# Patient Record
Sex: Male | Born: 1945 | Race: White | Hispanic: No | Marital: Married | Smoking: Former smoker
Health system: Southern US, Community
[De-identification: ages and names within clinical notes are randomized; demographics above are authoritative.]

## PROBLEM LIST (undated history)

## (undated) DIAGNOSIS — M1990 Unspecified osteoarthritis, unspecified site: Secondary | ICD-10-CM

## (undated) DIAGNOSIS — Z85528 Personal history of other malignant neoplasm of kidney: Secondary | ICD-10-CM

## (undated) DIAGNOSIS — N529 Male erectile dysfunction, unspecified: Secondary | ICD-10-CM

## (undated) DIAGNOSIS — J449 Chronic obstructive pulmonary disease, unspecified: Secondary | ICD-10-CM

## (undated) DIAGNOSIS — E7800 Pure hypercholesterolemia, unspecified: Secondary | ICD-10-CM

## (undated) DIAGNOSIS — K469 Unspecified abdominal hernia without obstruction or gangrene: Secondary | ICD-10-CM

## (undated) DIAGNOSIS — G2581 Restless legs syndrome: Secondary | ICD-10-CM

## (undated) DIAGNOSIS — I10 Essential (primary) hypertension: Secondary | ICD-10-CM

## (undated) DIAGNOSIS — C801 Malignant (primary) neoplasm, unspecified: Secondary | ICD-10-CM

## (undated) DIAGNOSIS — J349 Unspecified disorder of nose and nasal sinuses: Secondary | ICD-10-CM

## (undated) HISTORY — DX: Malignant (primary) neoplasm, unspecified: C80.1

## (undated) HISTORY — DX: Personal history of other malignant neoplasm of kidney: Z85.528

## (undated) HISTORY — PX: CARDIAC CATHETERIZATION: SHX172

## (undated) HISTORY — DX: Unspecified disorder of nose and nasal sinuses: J34.9

## (undated) HISTORY — DX: Chronic obstructive pulmonary disease, unspecified: J44.9

## (undated) HISTORY — DX: Male erectile dysfunction, unspecified: N52.9

## (undated) HISTORY — DX: Unspecified abdominal hernia without obstruction or gangrene: K46.9

## (undated) HISTORY — DX: Unspecified osteoarthritis, unspecified site: M19.90

## (undated) HISTORY — DX: Essential (primary) hypertension: I10

## (undated) HISTORY — DX: Pure hypercholesterolemia, unspecified: E78.00

## (undated) HISTORY — DX: Restless legs syndrome: G25.81

## (undated) NOTE — *Deleted (*Deleted)
Patient resting in the chair unresponsive, on Palliative Care with morphine 5mg /hr.

---

## 1992-11-09 HISTORY — PX: OTHER SURGICAL HISTORY: SHX169

## 1998-04-16 ENCOUNTER — Encounter: Admission: RE | Admit: 1998-04-16 | Discharge: 1998-04-16 | Payer: Self-pay | Admitting: *Deleted

## 1998-04-24 ENCOUNTER — Encounter: Admission: RE | Admit: 1998-04-24 | Discharge: 1998-04-24 | Payer: Self-pay | Admitting: *Deleted

## 1999-12-29 ENCOUNTER — Encounter: Payer: Self-pay | Admitting: Urology

## 1999-12-29 ENCOUNTER — Encounter: Admission: RE | Admit: 1999-12-29 | Discharge: 1999-12-29 | Payer: Self-pay | Admitting: Urology

## 2001-01-20 ENCOUNTER — Encounter: Payer: Self-pay | Admitting: Urology

## 2001-01-20 ENCOUNTER — Encounter: Admission: RE | Admit: 2001-01-20 | Discharge: 2001-01-20 | Payer: Self-pay | Admitting: Urology

## 2001-10-14 ENCOUNTER — Encounter: Payer: Self-pay | Admitting: Emergency Medicine

## 2001-10-14 ENCOUNTER — Emergency Department (HOSPITAL_COMMUNITY): Admission: EM | Admit: 2001-10-14 | Discharge: 2001-10-14 | Payer: Self-pay | Admitting: Emergency Medicine

## 2002-03-21 ENCOUNTER — Encounter: Payer: Self-pay | Admitting: Urology

## 2002-03-21 ENCOUNTER — Encounter: Admission: RE | Admit: 2002-03-21 | Discharge: 2002-03-21 | Payer: Self-pay | Admitting: Urology

## 2003-04-17 ENCOUNTER — Encounter: Admission: RE | Admit: 2003-04-17 | Discharge: 2003-04-17 | Payer: Self-pay | Admitting: Urology

## 2003-04-17 ENCOUNTER — Encounter: Payer: Self-pay | Admitting: Urology

## 2004-04-23 ENCOUNTER — Ambulatory Visit (HOSPITAL_COMMUNITY): Admission: RE | Admit: 2004-04-23 | Discharge: 2004-04-23 | Payer: Self-pay | Admitting: Urology

## 2005-04-28 ENCOUNTER — Ambulatory Visit (HOSPITAL_COMMUNITY): Admission: RE | Admit: 2005-04-28 | Discharge: 2005-04-28 | Payer: Self-pay | Admitting: Urology

## 2006-05-28 ENCOUNTER — Ambulatory Visit: Payer: Self-pay | Admitting: Pulmonary Disease

## 2006-07-15 ENCOUNTER — Ambulatory Visit: Payer: Self-pay | Admitting: Pulmonary Disease

## 2006-11-30 ENCOUNTER — Encounter: Admission: RE | Admit: 2006-11-30 | Discharge: 2006-11-30 | Payer: Self-pay | Admitting: Family Medicine

## 2007-01-18 ENCOUNTER — Ambulatory Visit: Payer: Self-pay | Admitting: Pulmonary Disease

## 2007-06-15 ENCOUNTER — Encounter: Admission: RE | Admit: 2007-06-15 | Discharge: 2007-06-15 | Payer: Self-pay | Admitting: Urology

## 2007-08-12 DIAGNOSIS — I10 Essential (primary) hypertension: Secondary | ICD-10-CM | POA: Insufficient documentation

## 2007-08-12 DIAGNOSIS — J309 Allergic rhinitis, unspecified: Secondary | ICD-10-CM | POA: Insufficient documentation

## 2007-08-12 DIAGNOSIS — E785 Hyperlipidemia, unspecified: Secondary | ICD-10-CM

## 2007-08-12 DIAGNOSIS — D0910 Carcinoma in situ of unspecified urinary organ: Secondary | ICD-10-CM

## 2007-08-12 DIAGNOSIS — J439 Emphysema, unspecified: Secondary | ICD-10-CM

## 2007-08-15 ENCOUNTER — Ambulatory Visit: Payer: Self-pay | Admitting: Pulmonary Disease

## 2008-09-14 ENCOUNTER — Ambulatory Visit: Payer: Self-pay | Admitting: Pulmonary Disease

## 2008-10-01 ENCOUNTER — Ambulatory Visit: Payer: Self-pay | Admitting: Internal Medicine

## 2008-10-31 ENCOUNTER — Telehealth (INDEPENDENT_AMBULATORY_CARE_PROVIDER_SITE_OTHER): Payer: Self-pay | Admitting: *Deleted

## 2008-11-01 ENCOUNTER — Ambulatory Visit: Payer: Self-pay | Admitting: Pulmonary Disease

## 2009-03-18 ENCOUNTER — Emergency Department (HOSPITAL_COMMUNITY): Admission: EM | Admit: 2009-03-18 | Discharge: 2009-03-18 | Payer: Self-pay | Admitting: Family Medicine

## 2009-03-19 ENCOUNTER — Ambulatory Visit: Payer: Self-pay | Admitting: Pulmonary Disease

## 2009-03-27 ENCOUNTER — Ambulatory Visit: Payer: Self-pay | Admitting: Pulmonary Disease

## 2009-07-25 ENCOUNTER — Ambulatory Visit: Payer: Self-pay | Admitting: Pulmonary Disease

## 2009-11-07 ENCOUNTER — Telehealth: Payer: Self-pay | Admitting: Pulmonary Disease

## 2009-12-13 ENCOUNTER — Telehealth: Payer: Self-pay | Admitting: Pulmonary Disease

## 2009-12-16 ENCOUNTER — Telehealth: Payer: Self-pay | Admitting: Pulmonary Disease

## 2010-01-21 ENCOUNTER — Ambulatory Visit: Payer: Self-pay | Admitting: Pulmonary Disease

## 2010-02-17 ENCOUNTER — Telehealth: Payer: Self-pay | Admitting: Pulmonary Disease

## 2010-05-02 ENCOUNTER — Ambulatory Visit: Payer: Self-pay | Admitting: Critical Care Medicine

## 2010-05-02 DIAGNOSIS — F17200 Nicotine dependence, unspecified, uncomplicated: Secondary | ICD-10-CM

## 2010-05-02 DIAGNOSIS — J209 Acute bronchitis, unspecified: Secondary | ICD-10-CM

## 2010-05-20 ENCOUNTER — Telehealth (INDEPENDENT_AMBULATORY_CARE_PROVIDER_SITE_OTHER): Payer: Self-pay | Admitting: *Deleted

## 2010-06-05 ENCOUNTER — Ambulatory Visit: Payer: Self-pay | Admitting: Pulmonary Disease

## 2010-10-06 ENCOUNTER — Ambulatory Visit: Payer: Self-pay | Admitting: Pulmonary Disease

## 2010-10-06 DIAGNOSIS — G2581 Restless legs syndrome: Secondary | ICD-10-CM

## 2010-12-11 NOTE — Assessment & Plan Note (Signed)
Summary: rov for emphysema   Visit Type:  Follow-up Primary Harold Hall/Referring Harold Hall:  Harold Hall  CC:  patient states breathing has been doing well.  Still smokes 3cigs a day.  History of Present Illness: the pt comes in today for f/u of his known emphysema.  He is maintaining on his usual bronchodilator regimen, and feels that his exertional tolerance is stable.  He has not had a recent pulmonary infection, nor has he had an acute exacerbation.  He denies significant cough or congestion.  Unfortunately, he is still smoking.  During our visit, he is c/o abnormal sensations in his LE which improves with movement and can interfere with sleep.  He has never been diagnosed with RLS.  Current Medications (verified): 1)  Lipitor 20 Mg  Tabs (Atorvastatin Calcium) .... Take One Tab By Mouth Once Daily 2)  Zyrtec Allergy 10 Mg  Tabs (Cetirizine Hcl) .... Take Once Daily 3)  Losartan Potassium-Hctz 100-25 Mg Tabs (Losartan Potassium-Hctz) .... Once Daily 4)  Buspirone Hcl 5 Mg  Tabs (Buspirone Hcl) .... Take Once Daily 5)  Spiriva Handihaler 18 Mcg  Caps (Tiotropium Bromide Monohydrate) .... Take One Cap Once Daily 6)  Astelin 137 Mcg/spray  Soln (Azelastine Hcl) .... Take As Needed 7)  Cialis 20 Mg  Tabs (Tadalafil) .... Take As Needed 8)  Proair Hfa 108 (90 Base) Mcg/act  Aers (Albuterol Sulfate) .Marland Kitchen.. 1-2 Puffs Every 4-6 Hours As Needed 9)  Mucinex 600 Mg Xr12h-Tab (Guaifenesin) .... Take 1 Tablet By Mouth Every 12 Hrs. As Needed 10)  Symbicort 160-4.5 Mcg/act Aero (Budesonide-Formoterol Fumarate) .... 2 Puffs in Pm Daily...rinse Mouth Well 11)  Amlodipine Besylate 5 Mg Tabs (Amlodipine Besylate) .... Once Daily  Allergies (verified): No Known Drug Allergies  Review of Systems       The patient complains of shortness of breath with activity, productive cough, acid heartburn, indigestion, nasal congestion/difficulty breathing through nose, hand/feet swelling, and change in color of mucus.   The patient denies shortness of breath at rest, non-productive cough, coughing up blood, chest pain, irregular heartbeats, loss of appetite, weight change, abdominal pain, difficulty swallowing, sore throat, tooth/dental problems, headaches, sneezing, itching, ear ache, anxiety, depression, joint stiffness or pain, rash, and fever.    Vital Signs:  Patient profile:   65 year old male Height:      76 inches Weight:      265 pounds BMI:     32.37 O2 Sat:      92 % on Room air Temp:     97.7 degrees F oral Pulse rate:   70 / minute BP sitting:   140 / 90  (left arm) Cuff size:   large  Vitals Entered By: Carver Fila (October 06, 2010 9:17 AM)  O2 Flow:  Room air CC: patient states breathing has been doing well.  Still smokes 3cigs a day Comments meds and allergies updated Phone number updated Carver Fila  October 06, 2010 9:10 AM    Physical Exam  General:  ow male in nad Lungs:  fairly clear, with  no wheezing or rhonchi Heart:  rrr Extremities:  no edema or cyanosis  Neurologic:  alert and oriented, moves all 4.   Impression & Recommendations:  Problem # 1:  EMPHYSEMA (ICD-492.8) the pt is stable from a pulmonary standpoint.  He is maintaining on his bronchodilators, and feels his breathing is unchanged from usual baseline.  I have asked him to quit smoking 100%, and to work on weight loss and  conditioning.  Problem # 2:  RESTLESS LEGS SYNDROME (ICD-333.94) His history is most c/w RLS, although msk pain and neuropathy could mimic this.  Will give him a trial of a dopamine agonist, and see if he has improvement.    Medications Added to Medication List This Visit: 1)  Losartan Potassium-hctz 100-25 Mg Tabs (Losartan potassium-hctz) .... Once daily 2)  Amlodipine Besylate 5 Mg Tabs (Amlodipine besylate) .... Once daily 3)  Requip 0.5 Mg Tabs (Ropinirole hcl) .... After dinner each night  Other Orders: Est. Patient Level III (16109)  Patient Instructions: 1)  no change  in pulmonary meds. 2)  quit smoking 100%. 3)  will try requip 0.5 mg each night after dinner for possible restless legs.  If works, can call in script for you. 4)  followup with me in 6mos, but call if not doing well.  Prescriptions: REQUIP 0.5 MG  TABS (ROPINIROLE HCL) after dinner each night  #30 x 0   Entered and Authorized by:   Barbaraann Share MD   Signed by:   Barbaraann Share MD on 10/06/2010   Method used:   Print then Give to Patient   RxID:   [id number]    Immunization History:  Influenza Immunization History:    Influenza:  historical (08/09/2010)  Pneumovax Immunization History:    Pneumovax:  historical (08/10/2007)

## 2010-12-11 NOTE — Progress Notes (Signed)
Summary: prescript  Phone Note Call from Patient   Caller: Patient Call For: clance Summary of Call: need prescript for benicar hct 40mg /12.5 he had samples only [facility] [address] Initial call taken by: Rickard Patience,  May 20, 2010 3:00 PM  Follow-up for Phone Call        Called and spoke with pt.  He was last seen by PW on 05/02/10 for acute visit and was taken off ACE and started benicar/hct.  Pt states that he is doing much better, cough has almost completely resolved.  Can we give more samples of benicar or call in rx for this? Please advise as KC is out of the office this wk, thanks!! Follow-up by: Vernie Murders,  May 20, 2010 3:12 PM  Additional Follow-up for Phone Call Additional follow up Details #1::        This is ok  pw Additional Follow-up by: Storm Frisk MD,  May 20, 2010 3:19 PM    Additional Follow-up for Phone Call Additional follow up Details #2::    Spoke with pt.  Advised samples left up front and he should let his PCP know about med change and have them call in rx since they followup on his BP with him.  Pt verbalized understanding. Follow-up by: Vernie Murders,  May 20, 2010 3:40 PM

## 2010-12-11 NOTE — Progress Notes (Signed)
Summary: pharmacy needs to talk to nurse  Phone Note From Pharmacy Call back at [phone]    Caller: Rosey Bath [facility] pharmacy) Call For: Maaliyah Adolph  Summary of Call: teresa needs to talk to a nurse regarding foradil aerolizer caps  referance # [phone] Initial call taken by: Valinda Hoar,  December 16, 2009 1:04 PM  Follow-up for Phone Call         Arlie Solomons is currently unavailable. advised [facility] that we have given the pt samples of another med to try..if he does well we will send in rx at a later date. they stated they will cancel foradil request. Carron Curie CMA  December 16, 2009 3:11 PM

## 2010-12-11 NOTE — Assessment & Plan Note (Signed)
Summary: Pulmonary OV   Primary Provider/Referring Provider:  [name]  CC:  Acute Visit.  Pt of Dr. Shelle Iron.  Pt c/o chest congestion, prod cough with clear to milky mucus, increased SOB with exertion, and chest tightness x10days.  Pt also states he is having low grade fever qhs.  .  History of Present Illness: the pt comes in today for f/u of his known emphysema, and recurrent AB assoc. with ongoing smoking.  Overall, he is doing well, but did have an episode of "congestion" in Jan that resolved spontaneously.  He denies congestion currently, mild cough, no purulence.  His doe is at baseline.  He is tolerating symbicort much better this time around.   May 02, 2010 4:36 PM Here as a work in   [employer] pulm pt with COPD/AB Ongoing smoking use. Started past week with a camping trip to [place].  Symptoms wiht chest congestion, sputum esp at bedtime.,  If exerts self is dyspneic.  Mucus is  milky as well.  Pt is noting dyspnea if exerts self. Still smoking 1.5PPD.  Has tried ecigs, nicotine patches.  Never tried the Chantix  Preventive Screening-Counseling & Management  Alcohol-Tobacco     Smoking Status: current     Smoking Cessation Counseling: yes     Packs/Day: 1.5 ppd     Year Started: 1 1/2 ppd x 40 yrs.  Current Medications (verified): 1)  Lipitor 20 Mg  Tabs (Atorvastatin Calcium) .... Take One Tab By Mouth Once Daily 2)  Zyrtec Allergy 10 Mg  Tabs (Cetirizine Hcl) .... Take Once Daily 3)  Lisinopril-Hydrochlorothiazide 20-25 Mg  Tabs (Lisinopril-Hydrochlorothiazide) .... Take Once Daily 4)  Buspirone Hcl 5 Mg  Tabs (Buspirone Hcl) .... Take Once Daily 5)  Spiriva Handihaler 18 Mcg  Caps (Tiotropium Bromide Monohydrate) .... Take One Cap Once Daily 6)  Astelin 137 Mcg/spray  Soln (Azelastine Hcl) .... Take As Needed 7)  Cialis 20 Mg  Tabs (Tadalafil) .... Take As Needed 8)  Proair Hfa 108 (90 Base) Mcg/act  Aers (Albuterol Sulfate) .Marland Kitchen.. 1-2 Puffs Every 4-6 Hours As Needed 9)   Mucinex 600 Mg Xr12h-Tab (Guaifenesin) .... Take 1 Tablet By Mouth Every 12 Hrs. As Needed 10)  Tessalon Perles 100 Mg  Caps (Benzonatate) .... Two By Mouth Every 6 Hrs If Needed For Cough 11)  Symbicort 160-4.5 Mcg/act Aero (Budesonide-Formoterol Fumarate) .... 2 Puffs in Pm Daily...rinse Mouth Well 12)  Foradil Aerolizer 12 Mcg Caps (Formoterol Fumarate) .... 2 Puffs in Am  Allergies (verified): No Known Drug Allergies  Past History:  Past medical, surgical, family and social histories (including risk factors) reviewed, and no changes noted (except as noted below).  Past Medical History: Reviewed history from 10/01/2008 and no changes required. Allergic rhinitis Hypertension COPD  Family History: Reviewed history and no changes required.  Social History: Reviewed history from 07/25/2009 and no changes required.   Patient is a current smoker.  1 ppd.    Review of Systems       The patient complains of shortness of breath with activity, shortness of breath at rest, productive cough, non-productive cough, chest pain, and change in color of mucus.  The patient denies coughing up blood, irregular heartbeats, acid heartburn, indigestion, loss of appetite, weight change, abdominal pain, difficulty swallowing, sore throat, tooth/dental problems, headaches, nasal congestion/difficulty breathing through nose, sneezing, itching, ear ache, anxiety, depression, hand/feet swelling, joint stiffness or pain, rash, and fever.    Vital Signs:  Patient profile:   65 year  old male Height:      76 inches Weight:      253 pounds BMI:     30.91 O2 Sat:      92 % on Room air Temp:     98.0 degrees F oral Pulse rate:   82 / minute BP sitting:   152 / 84  (left arm) Cuff size:   large  Vitals Entered By: Gweneth Dimitri RN (May 02, 2010 4:25 PM)  O2 Flow:  Room air CC: Acute Visit.  Pt of Dr. Shelle Iron.  Pt c/o chest congestion, prod cough with clear to milky mucus, increased SOB with exertion,  chest tightness x10days.  Pt also states he is having low grade fever qhs.   Comments Medications reviewed with patient Daytime contact number verified with patient. Gweneth Dimitri RN  May 02, 2010 4:25 PM    Physical Exam  Additional Exam:  Gen: Pleasant,obese, in no distress,  normal affect ENT: No lesions,  mouth clear,  oropharynx clear, no postnasal drip Neck: No JVD, no TMG, no carotid bruits Lungs: No use of accessory muscles, exp wheeze, scattered rhonchi Cardiovascular: RRR, heart sounds normal, no murmur or gallops, no peripheral edema Abdomen: soft and NT, no HSM,  BS normal Musculoskeletal: No deformities, no cyanosis or clubbing Neuro: alert, non focal Skin: Warm, no lesions or rashes    Impression & Recommendations:  Problem # 1:  ACUTE BRONCHITIS (ICD-466.0) Assessment Deteriorated acute tracheobronchitis with flare copd exac, ace inhibitor lowering threshold to cough  plan Start Avelox one daily for 5 days, use samples Stop lisinopril/HCTZ Start Benicar 40mg /12.5 HCTZ at one daily use samples Prednisone 10mg  Take 4 daily for two days, then 3 daily for two days, then two daily for two days then one daily for two days then stop His updated medication list for this problem includes:    Spiriva Handihaler 18 Mcg Caps (Tiotropium bromide monohydrate) .Marland Kitchen... Take one cap once daily    Proair Hfa 108 (90 Base) Mcg/act Aers (Albuterol sulfate) .Marland Kitchen... 1-2 puffs every 4-6 hours as needed    Mucinex 600 Mg Xr12h-tab (Guaifenesin) .Marland Kitchen... Take 1 tablet by mouth every 12 hrs. as needed    Tessalon Perles 100 Mg Caps (Benzonatate) .Marland Kitchen..Marland Kitchen Two by mouth every 6 hrs if needed for cough    Symbicort 160-4.5 Mcg/act Aero (Budesonide-formoterol fumarate) .Marland Kitchen... 2 puffs in pm daily...rinse mouth well    Foradil Aerolizer 12 Mcg Caps (Formoterol fumarate) .Marland Kitchen... 2 puffs in am    Avelox 400 Mg Tabs (Moxifloxacin hcl) ..... By mouth daily  Orders: Est. Patient Level V (16109)  Problem #  2:  NICOTINE ADDICTION (ICD-305.1) Assessment: Unchanged ongoing nicotine addiction plan smoking cessation counselling at least time,  chantix ordered as well,  His updated medication list for this problem includes:    Chantix Starting Month Pak 0.5 Mg X 11 & 1 Mg X 42 Misc (Varenicline tartrate) .Marland Kitchen... Take as directed---refills are to be for the continuing pak  Orders: Est. Patient Level V (60454) Tobacco use cessation intensive >10 minutes (09811)  Medications Added to Medication List This Visit: 1)  Benicar Hct 40-12.5 Mg Tabs (Olmesartan medoxomil-hctz) .... One tablet by mouth daily 2)  Symbicort 160-4.5 Mcg/act Aero (Budesonide-formoterol fumarate) .... 2 puffs in pm daily...rinse mouth well 3)  Foradil Aerolizer 12 Mcg Caps (Formoterol fumarate) .... 2 puffs in am 4)  Avelox 400 Mg Tabs (Moxifloxacin hcl) .... By mouth daily 5)  Prednisone 10 Mg Tabs (Prednisone) .... Take as  directed take 4 daily for two days, then 3 daily for two days, then two daily for two days then one daily for two days then stop 6)  Chantix Starting Month Pak 0.5 Mg X 11 & 1 Mg X 42 Misc (Varenicline tartrate) .... Take as directed---refills are to be for the continuing pak  Complete Medication List: 1)  Lipitor 20 Mg Tabs (Atorvastatin calcium) .... Take one tab by mouth once daily 2)  Zyrtec Allergy 10 Mg Tabs (Cetirizine hcl) .... Take once daily 3)  Benicar Hct 40-12.5 Mg Tabs (Olmesartan medoxomil-hctz) .... One tablet by mouth daily 4)  Buspirone Hcl 5 Mg Tabs (Buspirone hcl) .... Take once daily 5)  Spiriva Handihaler 18 Mcg Caps (Tiotropium bromide monohydrate) .... Take one cap once daily 6)  Astelin 137 Mcg/spray Soln (Azelastine hcl) .... Take as needed 7)  Cialis 20 Mg Tabs (Tadalafil) .... Take as needed 8)  Proair Hfa 108 (90 Base) Mcg/act Aers (Albuterol sulfate) .Marland Kitchen.. 1-2 puffs every 4-6 hours as needed 9)  Mucinex 600 Mg Xr12h-tab (Guaifenesin) .... Take 1 tablet by mouth every 12 hrs.  as needed 10)  Tessalon Perles 100 Mg Caps (Benzonatate) .... Two by mouth every 6 hrs if needed for cough 11)  Symbicort 160-4.5 Mcg/act Aero (Budesonide-formoterol fumarate) .... 2 puffs in pm daily...rinse mouth well 12)  Foradil Aerolizer 12 Mcg Caps (Formoterol fumarate) .... 2 puffs in am 13)  Avelox 400 Mg Tabs (Moxifloxacin hcl) .... By mouth daily 14)  Prednisone 10 Mg Tabs (Prednisone) .... Take as directed take 4 daily for two days, then 3 daily for two days, then two daily for two days then one daily for two days then stop 15)  Chantix Starting Month Pak 0.5 Mg X 11 & 1 Mg X 42 Misc (Varenicline tartrate) .... Take as directed---refills are to be for the continuing pak  Patient Instructions: 1)  Start Avelox one daily for 5 days, use samples 2)  Stop lisinopril/HCTZ 3)  Start Benicar 40mg /12.5 HCTZ at one daily use samples 4)  Prednisone 10mg  Take 4 daily for two days, then 3 daily for two days, then two daily for two days then one daily for two days then stop 5)  Consider starting Chantix for smoking cessation 6)  Return to see Dr Shelle Iron in 3-4 weeks Prescriptions: CHANTIX STARTING MONTH PAK 0.5 MG X 11 & 1 MG X 42  MISC (VARENICLINE TARTRATE) Take as directed---Refills are to be for the continuing pak  #1 x 2   Entered and Authorized by:   Storm Frisk MD   Signed by:   Storm Frisk MD on 05/02/2010   Method used:   Print then Give to Patient   RxID:   [id number] PREDNISONE 10 MG  TABS (PREDNISONE) Take as directed Take 4 daily for two days, then 3 daily for two days, then two daily for two days then one daily for two days then stop  #20 x 0   Entered and Authorized by:   Storm Frisk MD   Signed by:   Storm Frisk MD on 05/02/2010   Method used:   Electronically to        [facility] [address] [phone]* (retail)       [address]       [place], [place]  [address]       Ph: [phone] or [phone]  Fax: [phone]    RxID:   [id number]     Appended Document: Pulmonary OV fax [name]

## 2010-12-11 NOTE — Progress Notes (Signed)
Summary: medication  Phone Note From Pharmacy Call back at [phone] [phone]   Caller: [facility] Call For: Harold Hall  Summary of Call: calling about foradil aerolizer not available want to know alternative Initial call taken by: Rickard Patience,  December 13, 2009 11:31 AM  Follow-up for Phone Call        foradil is on national back order...please advise on alternative.Carron Curie CMA  December 13, 2009 12:00 PM   Additional Follow-up for Phone Call Additional follow up Details #1::        would try him on symbicort 160/4.5  2 puffs in am and pm daily...rinse mouth well see if he wants to come by for samples (2 boxes) Additional Follow-up by: Barbaraann Share MD,  December 13, 2009 5:35 PM    Additional Follow-up for Phone Call Additional follow up Details #2::    I informed pt of KC rec, and I advised pt to call us when the samples get low with an update of how he is doing. Samples left @ front desk for pt pick up. Zackery Barefoot CMA  December 16, 2009 9:22 AM   New/Updated Medications: SYMBICORT 160-4.5 MCG/ACT AERO (BUDESONIDE-FORMOTEROL FUMARATE) 2 puffs in am and pm daily...rinse mouth well Prescriptions: SYMBICORT 160-4.5 MCG/ACT AERO (BUDESONIDE-FORMOTEROL FUMARATE) 2 puffs in am and pm daily...rinse mouth well  #2 x 0   Entered by:   Zackery Barefoot CMA   Authorized by:   Barbaraann Share MD   Signed by:   Zackery Barefoot CMA on 12/16/2009   Method used:   Samples Given   RxID:   [id number]

## 2010-12-11 NOTE — Assessment & Plan Note (Signed)
Summary: rov for emphysema   Visit Type:  Follow-up Primary Provider/Referring Provider:  [name]  CC:  Pt here for 6 month follow up. Pt states breathing is the same since last OV. No complaints. Pt requesting refill of Symbicort sent to [facility] for three month supply.  History of Present Illness: the pt comes in today for f/u of his known emphysema, and recurrent AB assoc. with ongoing smoking.  Overall, he is doing well, but did have an episode of "congestion" in Jan that resolved spontaneously.  He denies congestion currently, mild cough, no purulence.  His doe is at baseline.  He is tolerating symbicort much better this time around.  Current Medications (verified): 1)  Lipitor 20 Mg  Tabs (Atorvastatin Calcium) .... Take One Tab By Mouth Once Daily 2)  Zyrtec Allergy 10 Mg  Tabs (Cetirizine Hcl) .... Take Once Daily 3)  Lisinopril-Hydrochlorothiazide 20-25 Mg  Tabs (Lisinopril-Hydrochlorothiazide) .... Take Once Daily 4)  Buspirone Hcl 5 Mg  Tabs (Buspirone Hcl) .... Take Once Daily 5)  Spiriva Handihaler 18 Mcg  Caps (Tiotropium Bromide Monohydrate) .... Take One Cap Once Daily 6)  Astelin 137 Mcg/spray  Soln (Azelastine Hcl) .... Take As Needed 7)  Cialis 20 Mg  Tabs (Tadalafil) .... Take As Needed 8)  Proair Hfa 108 (90 Base) Mcg/act  Aers (Albuterol Sulfate) .Marland Kitchen.. 1-2 Puffs Every 4-6 Hours As Needed 9)  Mucinex 600 Mg Xr12h-Tab (Guaifenesin) .... Take 1 Tablet By Mouth Every 12 Hrs. As Needed 10)  Tessalon Perles 100 Mg  Caps (Benzonatate) .... Two By Mouth Every 6 Hrs If Needed For Cough 11)  Symbicort 160-4.5 Mcg/act Aero (Budesonide-Formoterol Fumarate) .... 2 Puffs in Am and Pm Daily...rinse Mouth Well  Allergies (verified): No Known Drug Allergies  Review of Systems      See HPI  Vital Signs:  Patient profile:   65 year old male Height:      76 inches Weight:      258 pounds O2 Sat:      93 % on Room air Temp:     97.9 degrees F oral Pulse rate:   68 / minute BP  sitting:   150 / 92  (left arm) Cuff size:   regular  Vitals Entered By: Zackery Barefoot CMA (January 21, 2010 9:00 AM)  O2 Flow:  Room air CC: Pt here for 6 month follow up. Pt states breathing is the same since last OV. No complaints. Pt requesting refill of Symbicort sent to [facility] for three month supply Comments Medications reviewed with patient Verified contact number and pharmacy with patient  Zackery Barefoot CMA  January 21, 2010 9:00 AM    Physical Exam  General:  ow male in nad Lungs:  fairly clear no wheezing Heart:  rrr, no mrg   Impression & Recommendations:  Problem # 1:  EMPHYSEMA (ICD-492.8)  the pt is stable at present, but needs to quit smoking.  I have asked him to stay on the same meds, and followup with me in 6mos.  Other Orders: Est. Patient Level II (16109)  Patient Instructions: 1)  no change in meds 2)  stop smoking 3)  followup with me in 6mos   Prescriptions: SYMBICORT 160-4.5 MCG/ACT AERO (BUDESONIDE-FORMOTEROL FUMARATE) 2 puffs in am and pm daily...rinse mouth well  #3 x 6   Entered and Authorized by:   Barbaraann Share MD   Signed by:   Barbaraann Share MD on 01/21/2010   Method used:   Electronically  to        [facility]* (mail-order)             ,          Ph: [phone]       Fax: [phone]   RxID:   [id number]      Immunization History:  Influenza Immunization History:    Influenza:  historical (08/12/2009)

## 2010-12-11 NOTE — Progress Notes (Signed)
Summary: prescription-LMTCB  Phone Note Call from Patient   Caller: Patient Call For: Tamyka Bezio Summary of Call: pt would like to go back on foradil symbicort is making her nervous send for 3 month supply to [facility]  Initial call taken by: Rickard Patience,  February 17, 2010 1:58 PM  Follow-up for Phone Call        Pt requesting to go back on foradil.  Will forward to [facility] - pls advise if this is ok.  thanks! Gweneth Dimitri RN  February 17, 2010 2:17 PM   Additional Follow-up for Phone Call Additional follow up Details #1::        let him know that symbicort contains 2 meds,,,,foradil and an inhaled steroid. OF the two, foradil is what typically makes people nervous. as long as he smokes, symbicort will be the best med for him.  if he quits, foradil alone will be enough. with that said, I will do whatever he wants as long as he understands the above. Additional Follow-up by: Barbaraann Share MD,  February 17, 2010 4:20 PM    Additional Follow-up for Phone Call Additional follow up Details #2::    ATC @ home #.  LMOM TCB. Boone Master CNA  February 17, 2010 4:50 PM   Advised pt of [facility] recs, pt states has used Foradil 2 to 3 years w/o problems but when he uses Symbicort he "climbs the walls" and is unable to sleep. Pt states he understands KC recs and would like to go back on Foradil or something else other than Symbicort. Pt is aware KC out of office today. Please advise. Zackery Barefoot CMA  February 18, 2010 10:05 AM   Additional Follow-up for Phone Call Additional follow up Details #3:: Details for Additional Follow-up Action Taken: PER KC- ok to take foradil alone for now and see how he does.  he needs to quit smoking.   LMTCB. Carron Curie CMA  February 18, 2010 2:39 PM  Pt informed of KC recs. Rx sent to [facility]. Zackery Barefoot CMA  February 19, 2010 12:00 PM   New/Updated Medications: FORADIL AEROLIZER 12 MCG CAPS (FORMOTEROL FUMARATE) One puffs by mouth two times a day Prescriptions: FORADIL  AEROLIZER 12 MCG CAPS (FORMOTEROL FUMARATE) One puffs by mouth two times a day  #180 x 3   Entered by:   Zackery Barefoot CMA   Authorized by:   Barbaraann Share MD   Signed by:   Zackery Barefoot CMA on 02/19/2010   Method used:   Electronically to        [facility]* (mail-order)             ,          Ph: [phone]       Fax: [phone]   RxID:   [id number]

## 2010-12-11 NOTE — Assessment & Plan Note (Signed)
Summary: rov for emphysema   Primary Provider/Referring Provider:  [name]  CC:  Pt is here for a routine f/u appt.  Pt saw PW on 05-02-2010 for a sick visit.  Pt states breathing has improved and believes this is because he has quit smoking.  Pt states only an occ cough with scant amount of dark brown sputum. Marland Kitchen  History of Present Illness: the pt comes in today for f/u of his emphysema.  He was seen recently by Dr. Delford Field, and treated for copd exacerbation with abx and steroids.  He also had his ACE changed to ARB to help with cough.  The pt has actually stopped smoking since being on chantix, although he tells me he snitches on rare occasions.  He feels that he is actually better than his usual baseline, and has minimal cough.  Preventive Screening-Counseling & Management  Alcohol-Tobacco     Smoking Status: quit < 6 months     Smoking Cessation Counseling: yes     Tobacco Counseling: to remain off tobacco products  Current Medications (verified): 1)  Lipitor 20 Mg  Tabs (Atorvastatin Calcium) .... Take One Tab By Mouth Once Daily 2)  Zyrtec Allergy 10 Mg  Tabs (Cetirizine Hcl) .... Take Once Daily 3)  Benicar Hct 40-12.5 Mg  Tabs (Olmesartan Medoxomil-Hctz) .... One Tablet By Mouth Daily 4)  Buspirone Hcl 5 Mg  Tabs (Buspirone Hcl) .... Take Once Daily 5)  Spiriva Handihaler 18 Mcg  Caps (Tiotropium Bromide Monohydrate) .... Take One Cap Once Daily 6)  Astelin 137 Mcg/spray  Soln (Azelastine Hcl) .... Take As Needed 7)  Cialis 20 Mg  Tabs (Tadalafil) .... Take As Needed 8)  Proair Hfa 108 (90 Base) Mcg/act  Aers (Albuterol Sulfate) .Marland Kitchen.. 1-2 Puffs Every 4-6 Hours As Needed 9)  Mucinex 600 Mg Xr12h-Tab (Guaifenesin) .... Take 1 Tablet By Mouth Every 12 Hrs. As Needed 10)  Symbicort 160-4.5 Mcg/act Aero (Budesonide-Formoterol Fumarate) .... 2 Puffs in Pm Daily...rinse Mouth Well 11)  Foradil Aerolizer 12 Mcg Caps (Formoterol Fumarate) .... 2 Puffs in Am 12)  Chantix Starting Month  Pak 0.5 Mg X 11 & 1 Mg X 42  Misc (Varenicline Tartrate) .... Take As Directed---Refills Are To Be For The Continuing Pak  Allergies (verified): No Known Drug Allergies  Social History:   Former smoker.  1 ppd x since age 22.  Quit May 10, 2010.   Smoking Status:  quit < 6 months  Review of Systems       The patient complains of productive cough, acid heartburn, indigestion, headaches, and nasal congestion/difficulty breathing through nose.  The patient denies shortness of breath with activity, shortness of breath at rest, non-productive cough, coughing up blood, chest pain, irregular heartbeats, loss of appetite, weight change, abdominal pain, difficulty swallowing, sore throat, tooth/dental problems, sneezing, itching, ear ache, anxiety, depression, hand/feet swelling, joint stiffness or pain, rash, change in color of mucus, and fever.    Vital Signs:  Patient profile:   65 year old male Height:      76 inches Weight:      258.38 pounds BMI:     31.56 O2 Sat:      91 % on Room air Temp:     98.1 degrees F oral Pulse rate:   81 / minute BP sitting:   148 / 78  (right arm) Cuff size:   large  Vitals Entered By: Arman Filter LPN (June 05, 2010 9:15 AM)  O2 Flow:  Room air  CC: Pt is here for a routine f/u appt.  Pt saw PW on 05-02-2010 for a sick visit.  Pt states breathing has improved and believes this is because he has quit smoking.  Pt states only an occ cough with scant amount of dark brown sputum.  Comments Medications reviewed with patient Arman Filter LPN  June 05, 2010 9:17 AM    Physical Exam  General:  ow male in nad Lungs:  fairly clear, with no wheezing or rhonchi  Heart:  rrr, no mrg Extremities:  mild edema, no cyanosis Neurologic:  alert and oriented, moves all 4.   Impression & Recommendations:  Problem # 1:  EMPHYSEMA (ICD-492.8) the pt is doing well on his current regimen, and with his smoking cessation.  He is over his recent exacerbation, and has  returned to his prior baseline (and even better since smoking cessation).  I have asked him to stay on current regimen, and to try and increase his activity level.  I have also encouraged him to work on weight loss.  Other Orders: Est. Patient Level III (09811) Tobacco use cessation intermediate 3-10 minutes (91478)  Patient Instructions: 1)  stay on current medications 2)  stop smoking 100%. 3)  cancel upcoming apptm and followup with me in november.

## 2011-02-18 ENCOUNTER — Other Ambulatory Visit: Payer: Self-pay | Admitting: Family Medicine

## 2011-02-26 ENCOUNTER — Ambulatory Visit
Admission: RE | Admit: 2011-02-26 | Discharge: 2011-02-26 | Disposition: A | Discharge status: Home / Self Care | Payer: BC Managed Care – PPO | Source: Ambulatory Visit | Attending: Family Medicine | Admitting: Family Medicine

## 2011-02-26 MED ORDER — IOHEXOL 300 MG/ML  SOLN
125.0000 mL | Freq: Once | INTRAMUSCULAR | Status: AC | PRN
  Administered 2011-02-26: 125 mL via INTRAVENOUS

## 2011-03-24 ENCOUNTER — Other Ambulatory Visit: Payer: Self-pay | Admitting: Pulmonary Disease

## 2011-03-27 NOTE — Assessment & Plan Note (Signed)
[facility]                               PULMONARY OFFICE NOTE   KOLETON, DUCHEMIN                    MRN:          [id number]  DATE:05/28/2006                            DOB:          12-Aug-1946    HISTORY OF PRESENT ILLNESS:  Patient is a 65 year old gentleman who I have  been asked to see for dyspnea and COPD.  The patient states that he has been  short of breath for the last four years and has noticed an increase in  frequency of upper respiratory infections.  Unfortunately the patient is  continuing to smoke one and a half packs of cigarettes per day.  He  describes a 4-5 block dyspnea on exertion at a moderate pace on flat ground.  If there is any incline to the terrain, he notes a significant worsening of  his breathing.  He has no problems bringing groceries in from the car nor  his activities of daily living.  It seems that he primarily has difficult  with moderate to heavy exertion.  He describes cough which is intermittent  in nature and usually produces clear to yellow mucus.  He also describes  about two to three upper respiratory infections a year that is usually  treated with antibiotics.  There are pulmonary function studies available  from what appears to be 2003 where he had at least moderate air flow  obstruction with an FEV1 percent of 53.27 and an FEV1 of 2.52   PAST MEDICAL HISTORY:  1.  Hypertension.  2.  History of dyslipidemia.  3.  History of allergic rhinitis.  4.  History of renal cell carcinoma of the left kidney and status post      nephrectomy in 1994. Currently there is no evidence for recurrence.   CURRENT MEDICATIONS:  1.  Lipitor 20 mg daily.  2.  Zyrtec 10 mg daily.  3.  Lisinopril/hydrochlorothiazide 20/25 daily.  4.  Buspirone 5 mg daily.  5.  Albuterol p.r.n.   ALLERGIES:  NO KNOWN DRUG ALLERGIES.   SOCIAL HISTORY:  He has a history of smoking up to 2-1/2 packs per day.  He  has been smoking at  least 40 pack years.  He is now down to 1-1/2 packs per  day.  He is married and has children.  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   FAMILY HISTORY:  Remarkable for his father having had cancer, otherwise is  noncontributory.   REVIEW OF SYSTEMS:  As per history of present illness.  Also, see intake  form documented on the chart.   PHYSICAL EXAMINATION:  GENERAL APPEARANCE:  He is a overweight white male in  no acute distress.  VITAL SIGNS:  Blood pressure 110/76, pulse 58, temperature 97.8, weight 230  pounds, O2 saturation on room air is 95%.  HEENT:  Pupils are equal, round, reactive to light and accommodation.  Extraocular muscles are intact.  Nares are patent without discharge.  Oropharynx shows a large beefy uvula with some mild inflammation.  NECK:  Supple without JVD or  lymphadenopathy.  There is no palpable  thyromegaly.  CHEST:  Decreased breath sounds throughout but no wheezing.  CARDIOVASCULAR:  Regular rate and rhythm.  ABDOMEN:  Soft and nontender with good bowel sounds.  RECTAL AND BREASTS:  Examinations not done and not indicated.  EXTREMITIES:  Lower extremities are without edema.  Good pulses distally.  No calf tenderness.  NEUROLOGIC:  He is alert and oriented with no obvious mental deficits.   LABORATORY DATA:  Spirometry today in the office shows an FEV1 of 2.10 with  an FEV1 percent 47 consistent with severe air flow obstruction.  There was  some question of technique and these numbers may not be totally accurate,  however, I think they do accurately reflect significant airflow obstruction.   IMPRESSION:  Moderate to severe emphysema with ongoing tobacco abuse.  I  have had a long discussion with the patient about his disease process and  that smoking cessation is absolutely essential to keep his lung disease from  worsening.  We will try him on a bronchodilator regimen to see if we can  improve quality of life.    PLAN:  1.  Will initiate Spiriva one puff daily and he is to continue p.r.n.      albuterol.  2.  Stop smoking.  3.  The patient will follow up in six weeks or sooner if there are problems.  4.  If he is continuing to have difficulties we may consider a trial of LABA      and Imdur inhaled corticosteroids.                                   Barbaraann Share, MD, FCCP   [facility]  DD:  05/31/2006  DT:  05/31/2006  Job #:  [id number]   cc:   Bertram Millard. Dahlstedt, MD  L. [name], MD

## 2011-03-27 NOTE — Assessment & Plan Note (Signed)
[facility]                               PULMONARY OFFICE NOTE   Harold, Hall                    MRN:          [id number]  DATE:07/15/2006                            DOB:          05/06/1946    SUBJECTIVE:  Harold Hall comes in today for follow up after being started on  Spiriva for fairly severe airflow obstruction by spirometry.  The patient  unfortunately continues to smoke.  He has noticed a significant improvement  in his dyspnea on exertion and his activity level since being on Spiriva.  His wife has noticed that he is not as breathless with activity.  He is  happy with his improvement but feels there is room for further improvement.  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   PHYSICAL EXAMINATION:  GENERAL:  He is an overweight, white male, in no  acute distress.  Blood pressure 123/70, pulse 79, temperature 98.1, weight  232 pounds, O2 saturation on room air is 95%.  CHEST:  Reveals decreased breath sounds but otherwise is clear.  CARDIAC:  Reveals regular rate and rhythm.   IMPRESSION:  Moderate to severe emphysema which has shown significant  improvement from a functional standpoint with the Spiriva.  Patient is not  totally satisfied with his current quality of life and I am more than happy  to give him a trial of Foradil added to his Spiriva.  However, I have  cautioned him that cessation would probably be the best treatment that I  could offer him, and that his cough will probably not resolve until he  totally quit smoking.   PLAN:  1. Add Foradil one puff b.i.d. to the Spiriva.  2. Stop smoking.  3. The patient is to followup in six months or sooner if there are      problems.                                   Barbaraann Share, MD,FCCP   [facility]  DD:  07/15/2006  DT:  07/15/2006  Job #:  [id number]   cc:    L. [name], M.D.

## 2011-03-31 ENCOUNTER — Other Ambulatory Visit: Payer: Self-pay | Admitting: Pulmonary Disease

## 2011-04-08 ENCOUNTER — Ambulatory Visit (INDEPENDENT_AMBULATORY_CARE_PROVIDER_SITE_OTHER): Payer: BC Managed Care – PPO | Admitting: Pulmonary Disease

## 2011-04-08 ENCOUNTER — Encounter: Payer: Self-pay | Admitting: Pulmonary Disease

## 2011-04-08 ENCOUNTER — Telehealth: Payer: Self-pay | Admitting: Pulmonary Disease

## 2011-04-08 DIAGNOSIS — J449 Chronic obstructive pulmonary disease, unspecified: Secondary | ICD-10-CM

## 2011-04-08 DIAGNOSIS — G2581 Restless legs syndrome: Secondary | ICD-10-CM

## 2011-04-08 MED ORDER — PREDNISONE 20 MG PO TABS: ORAL_TABLET | ORAL | Status: DC

## 2011-04-08 NOTE — Telephone Encounter (Signed)
TARA WITH [facility] AWARE OF THE NEW RX

## 2011-04-08 NOTE — Progress Notes (Signed)
  Subjective:    Patient ID: Harold Hall, male    DOB: Jul 03, 1946, 65 y.o.   MRN: [id number]  HPI The pt comes in today for f/u of his know severe emphysema.  He has been compliant with his meds, but unfortunately continues to smoke.  He notes a 4 week h/o worsening sob, chest congestion, and a mild increase in cough.  He denies purulent mucus of any significance.  His sats are not as good today with ambulation.     Review of Systems  Constitutional: Positive for fever. Negative for unexpected weight change.  HENT: Positive for congestion, rhinorrhea, postnasal drip and sinus pressure. Negative for ear pain, nosebleeds, sore throat, sneezing, trouble swallowing and dental problem.   Eyes: Positive for redness and itching.  Respiratory: Positive for cough, chest tightness, shortness of breath and wheezing.   Cardiovascular: Negative for palpitations and leg swelling.  Gastrointestinal: Positive for nausea. Negative for vomiting.  Genitourinary: Negative for dysuria.  Musculoskeletal: Positive for joint swelling.  Skin: Negative for rash.  Neurological: Negative for headaches.  Hematological: Bruises/bleeds easily.  Psychiatric/Behavioral: Positive for dysphoric mood. The patient is nervous/anxious.        Objective:   Physical Exam Ow male in nad Chest with decreased bs, a few expir wheezes Cor with rrr LE with mild edema, no cyanosis noted.  Alert and oriented, moves all 4        Assessment & Plan:

## 2011-04-08 NOTE — Telephone Encounter (Signed)
Called spoke with Harold Hall at [facility] who states the wellbutrin 150mg  is only available in the extended release tabs.  However, wellbutrin does come in a plain 75mg  and 100mg  tablet.  KC, please advise your recs.  Thanks.

## 2011-04-08 NOTE — Patient Instructions (Signed)
No change in inhaler regimen Will treat with a short course of prednisone, but call if you begin to cough up more discolored mucus wellbutrin 150mg  one each day for 3 days, then one in am and pm thereafter.  Pick a quit date for complete smoking cessation, and stick to it.   followup with me in 6mos, but call if having breathing issues.

## 2011-04-08 NOTE — Telephone Encounter (Signed)
Change him over to wellbutrin SR 150mg  one bid from the very start.  #60, 2 fills.

## 2011-04-08 NOTE — Assessment & Plan Note (Signed)
The pt is continuing to smoke, and now is having an early exacerbation with increased sob and lower sats.  He is not bringing up purulent mucus.  Will treat him with a short course of prednisone, and have discussed with him again the importance of smoking cessation.  He is willing to try wellbutrin, but did not have success with chantix.

## 2011-04-14 ENCOUNTER — Telehealth: Payer: Self-pay | Admitting: Pulmonary Disease

## 2011-04-14 NOTE — Telephone Encounter (Signed)
Pt instructions from OV on 04/08/11 : Patient Instructions     No change in inhaler regimen  Will treat with a short course of prednisone, but call if you begin to cough up more discolored mucus  wellbutrin 150mg  one each day for 3 days, then one in am and pm thereafter. Pick a quit date for complete smoking cessation, and stick to it.  followup with me in 6mos, but call if having breathing issues     Called # provided - [facility]

## 2011-04-14 NOTE — Telephone Encounter (Signed)
Spoke with pt. He states that he has had prod cough with dark yellow sputum x 24 hours with low grade temp and chills. We was told to call [facility] if cough returned. Denies any wheeze, CP, SOB.  Pls advise thanks!

## 2011-04-14 NOTE — Telephone Encounter (Signed)
Patient phoned stated he is returning a call to triage, he can be reached at [phone].Harold Hall

## 2011-04-14 NOTE — Telephone Encounter (Signed)
Ok to call in [place] 300mg   2 each am for 7days.  No fills

## 2011-04-15 MED ORDER — CEFDINIR 300 MG PO CAPS: 600.0000 mg | ORAL_CAPSULE | ORAL | Status: AC

## 2011-04-15 NOTE — Telephone Encounter (Signed)
PT RETURNED CALL. PER JES R I INFORMED PT THAT HIS RX HAD BEEN SENT TO PHARM. NOTHING FURTHER NEEDED PER PT. Hazel Sams

## 2011-04-15 NOTE — Telephone Encounter (Signed)
LMOMTCB to inform pt rx sent

## 2011-04-30 ENCOUNTER — Other Ambulatory Visit: Payer: Self-pay | Admitting: Pulmonary Disease

## 2011-05-06 ENCOUNTER — Ambulatory Visit (INDEPENDENT_AMBULATORY_CARE_PROVIDER_SITE_OTHER): Payer: BC Managed Care – PPO | Admitting: Surgery

## 2011-05-06 ENCOUNTER — Encounter (INDEPENDENT_AMBULATORY_CARE_PROVIDER_SITE_OTHER): Payer: Self-pay | Admitting: Surgery

## 2011-05-06 DIAGNOSIS — K4020 Bilateral inguinal hernia, without obstruction or gangrene, not specified as recurrent: Secondary | ICD-10-CM

## 2011-05-06 DIAGNOSIS — R1012 Left upper quadrant pain: Secondary | ICD-10-CM | POA: Insufficient documentation

## 2011-05-06 DIAGNOSIS — K429 Umbilical hernia without obstruction or gangrene: Secondary | ICD-10-CM | POA: Insufficient documentation

## 2011-05-06 NOTE — Progress Notes (Signed)
History: Patient is a 65 year old white male who was last evaluated in our practice 12 years ago. He is referred by Dr. Clovis Riley for pain and a left subcostal incision. This is the site of a left nephrectomy for renal cell carcinoma performed by Dr. Ralph Leyden stent in 1994. Patient has always had discomfort at this site. Over the past several years the pain has increased. It is aggravated by physical activity. Patient denies any sign of hernia. He has had no other abdominal surgery.  Review of systems: Patient denies any signs or symptoms of bowel obstruction. He gives no symptoms of hernia. He has had no skin problems in the region of the incision.  Exam: HEENT: Normocephalic, sclerae clear. Pupils unequal with left pupil approximately 5 mm. Dentition fair. Mucous membranes moist. His quality normal. Chest: Clear to auscultation bilaterally, few rhonchi. No wheezes. Cardiac regular rate and rhythm without significant murmur Abdomen: Protuberant abdomen. Well healed surgical and left subcostal position without evidence of hernia. No tenderness. Umbilical hernia which is partially reducible but augments with Valsalva or cough. No other surgical incisions. Extremities: One plus edema at the ankles Neurologic: No focal neurologic deficits  Radiographic studies: His CT scan of the abdomen and pelvis from April 2012 is reviewed with the patient. No evidence of incisional hernia. Stable bilateral inguinal hernias. No other acute abnormality. Surgical absence of the left kidney.  Impression: #1-pain at left subcostal incision, no evidence of hernia, suspect intra-abdominal adhesions #2-reducible umbilical hernia, asymptomatic #3-bilateral inguinal hernias, asymptomatic  Plan: I discussed the case with the patient and his wife. I also discussed the case with Dr. Avel Peace. I believe he will be a candidate for laparoscopic bilateral inguinal hernia repair with mesh, concurrent umbilical hernia  repair, and laparoscopic lysis of adhesions along his left subcostal incision. Dr. Pollyann Kennedy will see the patient in our office for consultation in the near future and then make arrangements for this procedure.

## 2011-05-11 ENCOUNTER — Encounter (INDEPENDENT_AMBULATORY_CARE_PROVIDER_SITE_OTHER): Payer: Self-pay | Admitting: General Surgery

## 2011-05-11 ENCOUNTER — Ambulatory Visit (INDEPENDENT_AMBULATORY_CARE_PROVIDER_SITE_OTHER): Payer: BC Managed Care – PPO | Admitting: General Surgery

## 2011-05-11 VITALS — BP 132/76 | Temp 98.2°F | Resp 16 | Ht 74.0 in | Wt 268.2 lb

## 2011-05-11 DIAGNOSIS — R1012 Left upper quadrant pain: Secondary | ICD-10-CM

## 2011-05-11 NOTE — Progress Notes (Signed)
History of present illness: This is a 65 year old male recently seen by Dr. Gerrit Friends. His chief complaint was left upper quadrant pain had a previous nephrectomy incision. He underwent a CT to evaluate this. No incisional hernia was noted. However he was noted to have bilateral inguinal hernias containing fat. Dr. Gerrit Friends saw him and also noted an umbilical hernia. I spoke with Dr. Gerrit Friends about him. Our thought was at a bilateral laparoscopic inguinal hernia repair, diagnostic laparoscopy, an umbilical hernia repair could be performed at the same time. He is here to discuss that and is interested in having it done.  Past Medical History  Diagnosis Date  . Allergic rhinitis   . Hypertension   . RLS (restless legs syndrome)   . COPD (chronic obstructive pulmonary disease)   . Gout   . History of kidney cancer   . Cancer left renal cell   Past Surgical History  Procedure Date  . Left kidney removed 1994   No Known Allergies Current outpatient prescriptions:albuterol (PROAIR HFA) 108 (90 BASE) MCG/ACT inhaler, Inhale 2 puffs into the lungs every 6 (six) hours as needed.  , Disp: , Rfl: ;  amLODipine (NORVASC) 5 MG tablet, Take 5 mg by mouth daily.  , Disp: , Rfl: ;  atorvastatin (LIPITOR) 20 MG tablet, Take 20 mg by mouth daily.  , Disp: , Rfl:  azelastine (ASTELIN) 137 MCG/SPRAY nasal spray, Place 1 spray into the nose as needed. Use in each nostril as directed , Disp: , Rfl: ;  busPIRone (BUSPAR) 5 MG tablet, Take 5 mg by mouth 3 (three) times daily.  , Disp: , Rfl: ;  cetirizine (ZYRTEC) 10 MG tablet, Take 10 mg by mouth daily.  , Disp: , Rfl:  Dextromethorphan-Guaifenesin (MUCINEX DM MAXIMUM STRENGTH) 60-1200 MG per 12 hr tablet, Take 1 tablet by mouth 2 (two) times daily.  , Disp: , Rfl: ;  FIBER PO, Take 625 mg by mouth 3 (three) times daily.  , Disp: , Rfl: ;  fish oil-omega-3 fatty acids 1000 MG capsule, Take 1 capsule by mouth 3 (three) times daily.  , Disp: , Rfl: ;  ibuprofen (ADVIL,MOTRIN)  200 MG tablet, Take 200 mg by mouth. 3 to 4 times a day , Disp: , Rfl:  losartan-hydrochlorothiazide (HYZAAR) 100-25 MG per tablet, Take 1 tablet by mouth daily.  , Disp: , Rfl: ;  pseudoephedrine-guaifenesin (MUCINEX D) 60-600 MG per tablet, Take 1 tablet by mouth every 12 (twelve) hours.  , Disp: , Rfl: ;  rOPINIRole (REQUIP) 0.5 MG tablet, take 1 tablet by mouth AFTER DINNER EVERY NIGHT, Disp: 30 tablet, Rfl: 0 SYMBICORT 160-4.5 MCG/ACT inhaler, USE 2 INHALATIONS IN THE MORNING AND IN THE EVENING EACH DAY, RINSE MOUTH WELL, Disp: 3 Inhaler, Rfl: 0;  tadalafil (CIALIS) 20 MG tablet, Take 20 mg by mouth daily as needed.  , Disp: , Rfl: ;  temazepam (RESTORIL) 15 MG capsule, Take 15 mg by mouth at bedtime as needed.  , Disp: , Rfl: ;  tiotropium (SPIRIVA) 18 MCG inhalation capsule, Place 18 mcg into inhaler and inhale daily.  , Disp: , Rfl:  predniSONE (DELTASONE) 20 MG tablet, Take 2 tabs by mouth daily x 2 days, then 1 1/2 tabs by mouth daily x 2 days, then 1 tab daily x 2 days, then 1/2 tab by mouth daily x 2 days, then stop., Disp: 10 tablet, Rfl: 0  History   Social History  . Marital Status: Married    Spouse Name: N/A  Number of Children: N/A  . Years of Education: N/A   Occupational History  . Not on file.   Social History Main Topics  . Smoking status: Current Everyday Smoker -- 1.5 packs/day    Types: Cigarettes  . Smokeless tobacco: Not on file  . Alcohol Use: 1.0 oz/week    2 drink(s) per week     beers  . Drug Use: No  . Sexually Active: Not on file   Other Topics Concern  . Not on file   Social History Narrative  . No narrative on file     PE:  General: Overweight male in no acute distress.  Cardiovascular: Regular rate regular rhythm no murmur  Respiratory: Breath sounds equal and clear, nonlabored.  Abdomen: Off. Left subcostal scar is present without hernia. Small umbilical bulge consistent with hernia is noted and reducible.  GU: Bilateral inguinal  bulges noted that are easily reducible.  Musculoskeletal: No edema and good range of motion.  Assessment: #1. Left upper quadrant abdominal wall pain with no evidence of incisional hernia. Could be related to nerve entrapment versus adhesions. #2. Lateral inguinal hernias. #3. Umbilical hernia.  Plan: Laparoscopic repair of bilateral inguinal hernias with mesh, diagnostic laparoscopy and possible lysis of adhesions, umbilical hernia repair with mesh.  I have discussed procedure, risks, and aftercare in detail. Risks include but not limited to bleeding, infection, anesthesia, wound healing problems, recurrence, nerve entrapment, accidental injury to intra-abdominal organs.  He in his wife seem to understand and agree with the plan.

## 2011-05-14 ENCOUNTER — Encounter: Payer: Self-pay | Admitting: Pulmonary Disease

## 2011-05-14 ENCOUNTER — Ambulatory Visit (INDEPENDENT_AMBULATORY_CARE_PROVIDER_SITE_OTHER)
Admission: RE | Admit: 2011-05-14 | Discharge: 2011-05-14 | Disposition: A | Discharge status: Home / Self Care | Payer: BC Managed Care – PPO | Source: Ambulatory Visit | Attending: Pulmonary Disease | Admitting: Pulmonary Disease

## 2011-05-14 ENCOUNTER — Ambulatory Visit (INDEPENDENT_AMBULATORY_CARE_PROVIDER_SITE_OTHER): Payer: BC Managed Care – PPO | Admitting: Pulmonary Disease

## 2011-05-14 VITALS — BP 152/84 | HR 80 | Temp 98.1°F | Ht 74.0 in | Wt 268.0 lb

## 2011-05-14 DIAGNOSIS — J449 Chronic obstructive pulmonary disease, unspecified: Secondary | ICD-10-CM

## 2011-05-14 DIAGNOSIS — J4489 Other specified chronic obstructive pulmonary disease: Secondary | ICD-10-CM

## 2011-05-14 MED ORDER — PREDNISONE 10 MG PO TABS: ORAL_TABLET | ORAL | Status: DC

## 2011-05-14 MED ORDER — VARENICLINE TARTRATE 1 MG PO TABS: 1.0000 mg | ORAL_TABLET | Freq: Two times a day (BID) | ORAL | Status: DC

## 2011-05-14 MED ORDER — VARENICLINE TARTRATE 0.5 MG X 11 & 1 MG X 42 PO MISC: ORAL | Status: DC

## 2011-05-14 MED ORDER — LEVOFLOXACIN 750 MG PO TABS: 750.0000 mg | ORAL_TABLET | Freq: Every day | ORAL | Status: AC

## 2011-05-14 NOTE — Progress Notes (Signed)
  Subjective:    Patient ID: Harold Hall, male    DOB: 1946/03/09, 65 y.o.   MRN: [id number]  HPI The pt comes in today for an acute sick visit.  He has known emphysema, along with recurrent acute exacerbations due to his ongoing smoking.  He comes in today with a one week h/o worsening chest congestion, cough, purulent mucus, and worsening sob.  He has also noted increased wheezing.  I have been trying to work with him on smoking cessation, and currently he is failing wellbutrin.    Review of Systems  Constitutional: Negative for fever and unexpected weight change.  HENT: Positive for congestion. Negative for ear pain, nosebleeds, sore throat, rhinorrhea, sneezing, trouble swallowing, dental problem, postnasal drip and sinus pressure.   Eyes: Negative for redness and itching.  Respiratory: Positive for cough and shortness of breath. Negative for chest tightness and wheezing.   Cardiovascular: Positive for leg swelling. Negative for palpitations.  Gastrointestinal: Negative for nausea and vomiting.  Genitourinary: Negative for dysuria.  Musculoskeletal: Negative for joint swelling.  Skin: Negative for rash.  Neurological: Negative for headaches.  Hematological: Bruises/bleeds easily.  Psychiatric/Behavioral: Negative for dysphoric mood. The patient is nervous/anxious.        Objective:   Physical Exam Obese male in nad Nares patent without discharge, no purulence noted. OP clear Chest with decreased bs, +rhonchi, no wheezing. Cor with rrr. LE with mild edema, no cyanosis  Alert, oriented, moves all 4        Assessment & Plan:

## 2011-05-14 NOTE — Patient Instructions (Signed)
Will treat with a course of prednisone and an antibiotic. Can restart chantix, but stop wellbutrin if you do this Stop smoking Will check cxr today and will call you with results.

## 2011-05-15 NOTE — Assessment & Plan Note (Signed)
The pt continues to have ongoing copd exacerbations due to his ongoing smoking.  He will need another course of abx and prednisone to get him thru this episode. I have had another long conversation with him about his smoking, and he currently is failing wellbutrin.  He is willing to try chantix again.

## 2011-05-19 ENCOUNTER — Telehealth: Payer: Self-pay | Admitting: Pulmonary Disease

## 2011-05-19 ENCOUNTER — Other Ambulatory Visit: Payer: Self-pay | Admitting: Pulmonary Disease

## 2011-05-19 DIAGNOSIS — R911 Solitary pulmonary nodule: Secondary | ICD-10-CM | POA: Insufficient documentation

## 2011-05-19 MED ORDER — ALPRAZOLAM 0.5 MG PO TABS: ORAL_TABLET | ORAL | Status: DC

## 2011-05-19 NOTE — Telephone Encounter (Signed)
Ok to call in xanax 0.5mg , one about an hour before having ct scan.  Do not drive if sleepy or whoozy #1, no fills.

## 2011-05-19 NOTE — Telephone Encounter (Signed)
rx sent. Pt aware.Toree Edling, CMA  

## 2011-05-22 ENCOUNTER — Ambulatory Visit (INDEPENDENT_AMBULATORY_CARE_PROVIDER_SITE_OTHER)
Admission: RE | Admit: 2011-05-22 | Discharge: 2011-05-22 | Disposition: A | Discharge status: Home / Self Care | Payer: BC Managed Care – PPO | Source: Ambulatory Visit | Attending: Pulmonary Disease | Admitting: Pulmonary Disease

## 2011-05-22 DIAGNOSIS — R911 Solitary pulmonary nodule: Secondary | ICD-10-CM

## 2011-05-22 DIAGNOSIS — J984 Other disorders of lung: Secondary | ICD-10-CM

## 2011-05-22 MED ORDER — IOHEXOL 300 MG/ML  SOLN
80.0000 mL | Freq: Once | INTRAMUSCULAR | Status: AC | PRN
  Administered 2011-05-22: 80 mL via INTRAVENOUS

## 2011-06-01 ENCOUNTER — Telehealth: Payer: Self-pay | Admitting: Pulmonary Disease

## 2011-06-01 ENCOUNTER — Other Ambulatory Visit: Payer: Self-pay | Admitting: Pulmonary Disease

## 2011-06-01 MED ORDER — ALBUTEROL SULFATE HFA 108 (90 BASE) MCG/ACT IN AERS: 2.0000 | INHALATION_SPRAY | Freq: Four times a day (QID) | RESPIRATORY_TRACT | Status: DC | PRN

## 2011-06-01 NOTE — Telephone Encounter (Signed)
Rx for proair was sent to pharm. Pt made aware.

## 2011-06-02 ENCOUNTER — Encounter (HOSPITAL_COMMUNITY)
Admission: RE | Admit: 2011-06-02 | Discharge: 2011-06-02 | Disposition: A | Discharge status: Home / Self Care | Payer: BC Managed Care – PPO | Source: Ambulatory Visit | Attending: General Surgery | Admitting: General Surgery

## 2011-06-02 LAB — COMPREHENSIVE METABOLIC PANEL
CO2: 30 mEq/L (ref 19–32)
Calcium: 9.5 mg/dL (ref 8.4–10.5)
Creatinine, Ser: 1.26 mg/dL (ref 0.50–1.35)
GFR calc Af Amer: >60 mL/min (ref >60)
GFR calc non Af Amer: 58 mL/min — ABNORMAL LOW (ref >60)
Glucose, Bld: 142 mg/dL — ABNORMAL HIGH (ref 70–99)

## 2011-06-02 LAB — TYPE AND SCREEN: ABO/RH(D): O POS

## 2011-06-02 LAB — CBC
Hemoglobin: 18.1 g/dL — ABNORMAL HIGH (ref 13.0–17.0)
MCHC: 35.6 g/dL (ref 30.0–36.0)
Platelets: 144 10*3/uL — ABNORMAL LOW (ref 150–400)
RBC: 6.01 MIL/uL — ABNORMAL HIGH (ref 4.22–5.81)

## 2011-06-02 LAB — DIFFERENTIAL
Basophils Absolute: 0 10*3/uL (ref 0.0–0.1)
Basophils Relative: 0 % (ref 0–1)
Eosinophils Absolute: 0.3 10*3/uL (ref 0.0–0.7)
Neutro Abs: 5.4 10*3/uL (ref 1.7–7.7)
Neutrophils Relative %: 67 % (ref 43–77)

## 2011-06-02 LAB — PROTIME-INR
INR: 0.91 (ref 0.00–1.49)
Prothrombin Time: 12.5 seconds (ref 11.6–15.2)

## 2011-06-02 LAB — SURGICAL PCR SCREEN: Staphylococcus aureus: NEGATIVE

## 2011-06-04 ENCOUNTER — Other Ambulatory Visit: Payer: Self-pay | Admitting: *Deleted

## 2011-06-04 HISTORY — PX: HERNIA REPAIR: SHX51

## 2011-06-04 MED ORDER — ROPINIROLE HCL 0.5 MG PO TABS: 0.5000 mg | ORAL_TABLET | Freq: Every day | ORAL | Status: DC

## 2011-06-05 ENCOUNTER — Ambulatory Visit (HOSPITAL_COMMUNITY)
Admission: RE | Admit: 2011-06-05 | Discharge: 2011-06-08 | Disposition: A | Discharge status: Home / Self Care | Payer: BC Managed Care – PPO | Source: Ambulatory Visit | Attending: General Surgery | Admitting: General Surgery

## 2011-06-05 DIAGNOSIS — Z01812 Encounter for preprocedural laboratory examination: Secondary | ICD-10-CM | POA: Insufficient documentation

## 2011-06-05 DIAGNOSIS — G8929 Other chronic pain: Secondary | ICD-10-CM | POA: Insufficient documentation

## 2011-06-05 DIAGNOSIS — K429 Umbilical hernia without obstruction or gangrene: Secondary | ICD-10-CM | POA: Insufficient documentation

## 2011-06-05 DIAGNOSIS — R1012 Left upper quadrant pain: Secondary | ICD-10-CM | POA: Insufficient documentation

## 2011-06-05 DIAGNOSIS — J449 Chronic obstructive pulmonary disease, unspecified: Secondary | ICD-10-CM | POA: Insufficient documentation

## 2011-06-05 DIAGNOSIS — J4489 Other specified chronic obstructive pulmonary disease: Secondary | ICD-10-CM | POA: Insufficient documentation

## 2011-06-05 DIAGNOSIS — Z01811 Encounter for preprocedural respiratory examination: Secondary | ICD-10-CM | POA: Insufficient documentation

## 2011-06-05 DIAGNOSIS — Z905 Acquired absence of kidney: Secondary | ICD-10-CM | POA: Insufficient documentation

## 2011-06-05 DIAGNOSIS — K4020 Bilateral inguinal hernia, without obstruction or gangrene, not specified as recurrent: Secondary | ICD-10-CM

## 2011-06-05 DIAGNOSIS — F17200 Nicotine dependence, unspecified, uncomplicated: Secondary | ICD-10-CM | POA: Insufficient documentation

## 2011-06-05 DIAGNOSIS — K660 Peritoneal adhesions (postprocedural) (postinfection): Secondary | ICD-10-CM | POA: Insufficient documentation

## 2011-06-05 DIAGNOSIS — Z01810 Encounter for preprocedural cardiovascular examination: Secondary | ICD-10-CM | POA: Insufficient documentation

## 2011-06-05 DIAGNOSIS — Z85528 Personal history of other malignant neoplasm of kidney: Secondary | ICD-10-CM | POA: Insufficient documentation

## 2011-06-05 DIAGNOSIS — I440 Atrioventricular block, first degree: Secondary | ICD-10-CM | POA: Insufficient documentation

## 2011-06-05 DIAGNOSIS — I10 Essential (primary) hypertension: Secondary | ICD-10-CM | POA: Insufficient documentation

## 2011-06-05 NOTE — Telephone Encounter (Signed)
This is a blank refill request . No medication listed.

## 2011-06-07 LAB — CBC
HCT: 34.4 % — ABNORMAL LOW (ref 39.0–52.0)
Hemoglobin: 11.5 g/dL — ABNORMAL LOW (ref 13.0–17.0)
MCHC: 33.4 g/dL (ref 30.0–36.0)
MCV: 86.9 fL (ref 78.0–100.0)
RDW: 14.1 % (ref 11.5–15.5)
WBC: 9.5 10*3/uL (ref 4.0–10.5)

## 2011-06-07 LAB — BASIC METABOLIC PANEL
BUN: 25 mg/dL — ABNORMAL HIGH (ref 6–23)
Chloride: 99 mEq/L (ref 96–112)
Creatinine, Ser: 1.76 mg/dL — ABNORMAL HIGH (ref 0.50–1.35)
GFR calc Af Amer: 47 mL/min — ABNORMAL LOW (ref >60)
Glucose, Bld: 121 mg/dL — ABNORMAL HIGH (ref 70–99)

## 2011-06-08 ENCOUNTER — Telehealth (INDEPENDENT_AMBULATORY_CARE_PROVIDER_SITE_OTHER): Payer: Self-pay

## 2011-06-08 DIAGNOSIS — R1011 Right upper quadrant pain: Secondary | ICD-10-CM

## 2011-06-08 LAB — CBC
HCT: 32.8 % — ABNORMAL LOW (ref 39.0–52.0)
MCH: 28.8 pg (ref 26.0–34.0)
MCHC: 33.2 g/dL (ref 30.0–36.0)
RDW: 14.1 % (ref 11.5–15.5)

## 2011-06-08 LAB — BASIC METABOLIC PANEL
BUN: 29 mg/dL — ABNORMAL HIGH (ref 6–23)
Calcium: 8.9 mg/dL (ref 8.4–10.5)
Creatinine, Ser: 1.74 mg/dL — ABNORMAL HIGH (ref 0.50–1.35)
GFR calc Af Amer: 48 mL/min — ABNORMAL LOW (ref >60)
GFR calc non Af Amer: 40 mL/min — ABNORMAL LOW (ref >60)
Glucose, Bld: 150 mg/dL — ABNORMAL HIGH (ref 70–99)
Potassium: 3.8 mEq/L (ref 3.5–5.1)

## 2011-06-08 NOTE — Telephone Encounter (Signed)
Dr Abbey Chatters called in requesting pt to get some labs drawn on 06-10-11. He is requested pt to get at [facility] with call report. I will call the pt with to notify him to go to [facility] on Wednesday.Harold Hall

## 2011-06-08 NOTE — Op Note (Signed)
NAMEDSHAWN, MCNAY NO.:  [id number]  MEDICAL RECORD NO.:  [id number]  LOCATION:  [facility]                         FACILITY:  [facility]  PHYSICIAN:  Adolph Pollack, M.D.DATE OF BIRTH:  May 04, 1946  DATE OF PROCEDURE:  06/05/2011 DATE OF DISCHARGE:                              OPERATIVE REPORT   PREOPERATIVE DIAGNOSES: 1. Chronic left upper quadrant abdominal pain. 2. Bilateral inguinal hernias. 3. Umbilical hernia.  POSTOPERATIVE DIAGNOSES: 1. Chronic left upper quadrant abdominal pain. 2. Bilateral inguinal hernias. 3. Umbilical hernia.  PROCEDURE: 1. Laparoscopic bilateral inguinal hernia repair with mesh. 2. Diagnostic laparoscopy with lysis of adhesions in left upper     quadrant. 3. Umbilical hernia repair with mesh.  SURGEON:  Adolph Pollack, MD  ANESTHESIA:  General.  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  A CT scan demonstrated no incisional hernia.  It did however demonstrate an umbilical hernia and 2 inguinal hernias both of which are clinically evident.  He now presents for the above procedures.  We discussed the procedure risks and aftercare preoperatively.  TECHNIQUE:  He is brought to the operating room, placed supine on the operating table and general anesthetic was administered.  A Foley catheter was inserted.  The hair of the abdominal wall and groin was clipped and these areas were widely and sterilely prepped and draped.  Marcaine solution was infiltrated in the subumbilical region.  A transverse subumbilical incision was made through the skin and subcutaneous tissue.  I then identified the right anterior rectus sheath and made a small incision in it.  The underlying right rectus muscles which were then swept laterally exposing the posterior rectus sheath.  A balloon dissection device was then placed into the  extraperitoneal space and under laparoscopic vision balloon dissection of the extraperitoneal space of the lower abdomen was performed.  Balloon was removed, and the CO2 gas was insufflated.  Two 5-mm trocars were then placed in lower midline under laparoscopic vision.  Using blunt dissection, the symphysis pubis was exposed.  Cooper ligament was exposed bilaterally.  Beginning on the right side, I dissected fibrofatty tissue free from the anterior and lateral abdominal walls back to the level of the umbilicus.  I then isolated the spermatic cord.  There was a lot of extraperitoneal fat going up through a indirect hernia which I reduced.  I also reduced some of the hernia sac and dissected it free from the cord back to the level of the umbilicus. Posterior window was created around the cord.  Following this, I approached the left side.  Using blunt dissection, fibrofatty tissue was dissected free from the anterior and lateral abdominal walls.  This was done back to the level of the umbilicus.  I then isolated the spermatic cord and created a window around it.  There was a fairly adherent peritoneal sac adherent to the cord and an indirect hernia defect containing extraperitoneal fat as well.  As I was trying to reduce the sac, a very small tear was made and a pneumoperitoneum was created.  A Veress needle was then placed through the  subumbilical incision to the peritoneal cavity to help in to evacuate the pneumoperitoneum.  I subsequently was able to dissect the sac free from the cord reduce it back about the level of the umbilicus. I also reduced the extraperitoneal fat from the indirect hernia as well.  Following this, I brought two pieces of Parietex TECR 15 cm x 15 cm mesh into the field and cut about 2 cm off each.  A longitudinal slit was then cut in each.  I placed the first piece of mesh into the right extraperitoneal space and positioned it, so two tails were wrapped around  the spermatic cord.  I then anchored the mesh to [facility] ligament, the anterior and lateral abdominal walls with absorbable tacks.  This provided for good coverage with good overlap of the direct, indirect and femoral spaces.  Following this, I then put the other piece of mesh into the left extraperitoneal space and positioned it so that the two tails were wrapped around the spermatic cord.  I then anchored the mesh to [facility] ligament, then the anterior and lateral abdominal walls with absorbable tacks.  This provided for adequate coverage with good overlap of the direct, indirect and femoral spaces.  Following this, I inspected the area and no bleeding was noted.  I then held on the inferolateral aspect of each piece of mesh and released the CO2 gas and removed all the trocars.  I then closed the lower abdominal skin incisions with 4-0 Monocryl subcuticular stitches.  Following this, I approached the umbilical hernia and it was just adjacent to the left of the umbilical stalk.  I isolated this and incised the hernia sac creating access to the peritoneal cavity.  An 11- mm trocar was then placed in the peritoneal cavity and a pneumoperitoneum created by insufflation of CO2 gas.  Introduction of the laparoscope demonstrated  significant left upper quadrant adhesions between the omentum in the anterior abdominal wall where the incision was.  I then placed two 5 mm trocar in the epigastrium just to the right of midline.  I then performed sharp lysis of adhesions for approximately 30 minutes until I had freed up this entire area.  There was one adhesion of the colon to the abdominal wall and I was able to free this as well.  I then did a careful inspection of the omentum and the intestine.  There was no evidence of bleeding or intestinal leak.  Following this, four quadrant inspection was performed and there was no evidence of bleeding or organ injury.  I then released the CO2 gas and then  allowed this to evacuate and removed the trocars.  Following this, I approached the umbilical hernia.  I dissected the umbilical stalk free from the fascia full exposing the hernia defect. Using electrocautery, I then exposed fascia 3-4 cm around the primary defect.  I then closed the defect primarily with interrupted #1 Novafil sutures left long.  I brought a piece of 3 x 6 polypropylene mesh into the field and cut it so I would have 3-4 cm overlap over the primary repair.  The primary repair sutures were then threaded through the mesh and tied down anchoring the mesh directly over the primary repair. Periphery of the mesh was then anchored to the fascia with a running 0 Prolene suture.  Following this, I then injected some Marcaine solution to the fascia.  I inspected the wound and hemostasis was adequate.  The umbilicus was implanted down the mesh with a single  3-0 Vicryl suture.  The subcutaneous tissue was closed over the mesh with a running 3-0 Vicryl suture.  The skin was closed with a 4-0 Monocryl subcuticular stitch. Steri-Strips and sterile dressings were applied.  He tolerated the procedure without apparent complications and was taken to recovery room in satisfactory condition.     Adolph Pollack, M.D.     Kari Baars  D:  06/05/2011  T:  06/05/2011  Job:  [id number]  cc:   Barbaraann Share, MD,FCCP  Electronically Signed by Avel Peace M.D. on 06/08/2011 09:40:06 AM

## 2011-06-10 ENCOUNTER — Encounter: Payer: Self-pay | Admitting: Pulmonary Disease

## 2011-06-10 ENCOUNTER — Ambulatory Visit (INDEPENDENT_AMBULATORY_CARE_PROVIDER_SITE_OTHER): Payer: BC Managed Care – PPO | Admitting: Pulmonary Disease

## 2011-06-10 ENCOUNTER — Other Ambulatory Visit (INDEPENDENT_AMBULATORY_CARE_PROVIDER_SITE_OTHER): Payer: Self-pay | Admitting: General Surgery

## 2011-06-10 VITALS — BP 122/74 | HR 99 | Temp 98.2°F | Ht 74.0 in | Wt 273.8 lb

## 2011-06-10 DIAGNOSIS — J449 Chronic obstructive pulmonary disease, unspecified: Secondary | ICD-10-CM

## 2011-06-10 LAB — BASIC METABOLIC PANEL
Calcium: 9.3 mg/dL (ref 8.4–10.5)
Glucose, Bld: 118 mg/dL — ABNORMAL HIGH (ref 70–99)
Potassium: 4.1 mEq/L (ref 3.5–5.3)
Sodium: 143 mEq/L (ref 135–145)

## 2011-06-10 LAB — CBC WITH DIFFERENTIAL/PLATELET
Basophils Absolute: 0 10*3/uL (ref 0.0–0.1)
Basophils Relative: 0 % (ref 0–1)
Eosinophils Absolute: 0.3 10*3/uL (ref 0.0–0.7)
Hemoglobin: 11.2 g/dL — ABNORMAL LOW (ref 13.0–17.0)
MCH: 28.8 pg (ref 26.0–34.0)
MCHC: 33.2 g/dL (ref 30.0–36.0)
Neutro Abs: 5.6 10*3/uL (ref 1.7–7.7)
Neutrophils Relative %: 70 % (ref 43–77)
Platelets: 219 10*3/uL (ref 150–400)
RDW: 14 % (ref 11.5–15.5)

## 2011-06-10 MED ORDER — ROPINIROLE HCL 0.5 MG PO TABS: 0.5000 mg | ORAL_TABLET | Freq: Every day | ORAL | Status: DC

## 2011-06-10 NOTE — Patient Instructions (Signed)
Continue on same meds, as well as oxygen until our next visit.  Please call if breathing worsens. Stay off cigarettes followup with me in 4 weeks, and we can re-assess your oxygen needs at that time.

## 2011-06-10 NOTE — Progress Notes (Signed)
  Subjective:    Patient ID: Harold Hall, male    DOB: 1945-11-13, 65 y.o.   MRN: [id number]  HPI The pt comes in today for f/u after his recent hospitalization for hernia surgery.  He has known copd, and apparently had low sats at discharge that required starting on supplemental oxygen.  The pt is not having significant sob, but does feel he is more sob than usual.  He is having discomfort in his abd/pelvic area with inspiration, and has led to splinting.  He has mild congestion with scant discolored mucus, but does not feel he has bronchitis.  He is staying on his BD, and has not smoked in 5 days!   Review of Systems  Constitutional: Negative for fever and unexpected weight change.  HENT: Positive for congestion. Negative for ear pain, nosebleeds, sore throat, rhinorrhea, sneezing, trouble swallowing, dental problem, postnasal drip and sinus pressure.   Eyes: Negative for redness and itching.  Respiratory: Positive for cough, chest tightness, shortness of breath and wheezing.   Cardiovascular: Positive for leg swelling. Negative for palpitations.  Gastrointestinal: Positive for nausea. Negative for vomiting.  Genitourinary: Negative for dysuria.  Musculoskeletal: Negative for joint swelling.  Skin: Negative for rash.  Neurological: Negative for headaches.  Hematological: Bruises/bleeds easily.  Psychiatric/Behavioral: Negative for dysphoric mood. The patient is not nervous/anxious.        Objective:   Physical Exam Ow male in nad Nares without purulence or discharge Chest with decreased depth inspiration due to splinting, no wheezing, no rhonchi Cor with rrr Le with mild edema, no cyanosis Alert, oriented,moves all 4.        Assessment & Plan:

## 2011-06-10 NOTE — Assessment & Plan Note (Signed)
The pt has known severe copd, but is stable postop.  He has been borderline with respect to needing oxygen for quite some time, and more than likely this surgery "tipped him over".  Currently with no bronchospasm or increased wob, but he clearly is splinting with decreased depth of inspiration.  I have stressed to him again the importance of staying off cigs, and continuing his meds.  Would like to leave him on oxygen for another 4 weeks, then will re-assess whether he needs this.

## 2011-06-29 ENCOUNTER — Encounter (INDEPENDENT_AMBULATORY_CARE_PROVIDER_SITE_OTHER): Payer: Self-pay | Admitting: General Surgery

## 2011-06-30 ENCOUNTER — Encounter (INDEPENDENT_AMBULATORY_CARE_PROVIDER_SITE_OTHER): Payer: Self-pay | Admitting: General Surgery

## 2011-07-01 ENCOUNTER — Ambulatory Visit (INDEPENDENT_AMBULATORY_CARE_PROVIDER_SITE_OTHER): Payer: BC Managed Care – PPO | Admitting: General Surgery

## 2011-07-01 ENCOUNTER — Other Ambulatory Visit (INDEPENDENT_AMBULATORY_CARE_PROVIDER_SITE_OTHER): Payer: Self-pay

## 2011-07-01 DIAGNOSIS — K429 Umbilical hernia without obstruction or gangrene: Secondary | ICD-10-CM

## 2011-07-01 DIAGNOSIS — K4020 Bilateral inguinal hernia, without obstruction or gangrene, not specified as recurrent: Secondary | ICD-10-CM

## 2011-07-01 NOTE — Progress Notes (Signed)
Operation: Laparoscopic bilateral inguinal hernia repair, umbilical hernia repair, diagnostic laparoscopy and lysis of left upper quadrant adhesions.  Date: June 05, 2011  Pathology:na  HPI: He is here for his first postoperative visit. He reports having groin swelling. Still having some pain in the right groin area. Having less pain and left upper quadrant. He states his room air saturation is 97%.   Physical Exam: Harold Hall is in no acute distress.  Abdomen-well-healed small incisions with some swelling around the umbilicus but no redness.  GU-lateral groin swelling and  scrotal swelling left greater than right.   Assessment: Making slow steady healing process postoperatively.  Plan: Continue activity restrictions. Not ready to return to work. Return visit in 3-4 weeks.

## 2011-07-08 ENCOUNTER — Ambulatory Visit (INDEPENDENT_AMBULATORY_CARE_PROVIDER_SITE_OTHER): Payer: BC Managed Care – PPO | Admitting: Pulmonary Disease

## 2011-07-08 ENCOUNTER — Encounter: Payer: Self-pay | Admitting: Pulmonary Disease

## 2011-07-08 VITALS — BP 144/76 | HR 91 | Temp 98.3°F | Ht 74.0 in | Wt 265.4 lb

## 2011-07-08 DIAGNOSIS — J449 Chronic obstructive pulmonary disease, unspecified: Secondary | ICD-10-CM

## 2011-07-08 MED ORDER — BUPROPION HCL ER (SR) 150 MG PO TB12: 150.0000 mg | ORAL_TABLET | Freq: Two times a day (BID) | ORAL | Status: DC

## 2011-07-08 MED ORDER — ROPINIROLE HCL 0.5 MG PO TABS: 0.5000 mg | ORAL_TABLET | Freq: Every day | ORAL | Status: DC

## 2011-07-08 NOTE — Progress Notes (Signed)
  Subjective:    Patient ID: Harold Hall, male    DOB: Nov 12, 1945, 65 y.o.   MRN: [id number]  HPI The patient comes in today for followup of his known emphysema.  He recently had extensive hernia surgery which resulted in an acute exacerbation of his COPD.  He has been treated with appropriate medications, and feels that he is returned to his usual baseline.  He has quit smoking now for at least 4 weeks, and I congratulated him on this.  He denies any significant chest congestion or purulent mucus.  He was started on oxygen at discharge from the hospital, but this was discontinued recently by his surgeon, we need to recheck his oxygen saturations today for verification.   Review of Systems  Constitutional: Negative for fever and unexpected weight change.  HENT: Negative for ear pain, nosebleeds, congestion, sore throat, rhinorrhea, sneezing, trouble swallowing, dental problem, postnasal drip and sinus pressure.   Eyes: Positive for itching. Negative for redness.  Respiratory: Positive for shortness of breath. Negative for cough, chest tightness and wheezing.   Cardiovascular: Negative for palpitations and leg swelling.  Gastrointestinal: Negative for nausea and vomiting.  Genitourinary: Negative for dysuria.  Musculoskeletal: Negative for joint swelling.  Skin: Negative for rash.  Neurological: Negative for headaches.  Hematological: Bruises/bleeds easily.  Psychiatric/Behavioral: Negative for dysphoric mood. The patient is not nervous/anxious.        Objective:   Physical Exam Overweight male in no acute distress Nose without purulence or discharge noted Chest with decreased breath sounds, no wheezes or rhonchi Cardiac with regular rate and rhythm Lower extremities without significant edema, no cyanosis noted Alert and oriented, moves all 4 extremities.       Assessment & Plan:

## 2011-07-08 NOTE — Patient Instructions (Addendum)
We'll keep you on oxygen during sleep for now. Stay off cigarettes as you're doing, and work on weight reduction No change in inhaler regimen Follow up with me in 3 months or sooner if having issues.

## 2011-07-08 NOTE — Assessment & Plan Note (Signed)
The patient's much improved from his last visit, and has not smoked in 4 weeks.  I have congratulated him on this.  He has no significant chest congestion or cough, and is remaining on his bronchodilator regimen.  I've asked him to stay off cigarettes, and we'll see him back in 3 months for followup.

## 2011-07-08 NOTE — Progress Notes (Signed)
Addended by: Salli Quarry on: 07/08/2011 10:37 AM   Modules accepted: Orders

## 2011-07-09 ENCOUNTER — Encounter (INDEPENDENT_AMBULATORY_CARE_PROVIDER_SITE_OTHER): Payer: Self-pay

## 2011-07-13 ENCOUNTER — Other Ambulatory Visit: Payer: Self-pay | Admitting: Pulmonary Disease

## 2011-07-22 ENCOUNTER — Encounter (INDEPENDENT_AMBULATORY_CARE_PROVIDER_SITE_OTHER): Payer: Self-pay | Admitting: General Surgery

## 2011-07-22 ENCOUNTER — Ambulatory Visit (INDEPENDENT_AMBULATORY_CARE_PROVIDER_SITE_OTHER): Payer: BC Managed Care – PPO | Admitting: General Surgery

## 2011-07-22 VITALS — BP 162/90 | HR 58

## 2011-07-22 DIAGNOSIS — K4020 Bilateral inguinal hernia, without obstruction or gangrene, not specified as recurrent: Secondary | ICD-10-CM

## 2011-07-22 NOTE — Patient Instructions (Signed)
Activities as tolerated. 

## 2011-07-22 NOTE — Progress Notes (Signed)
Operation: Laparoscopic bilateral inguinal hernia repair, umbilical hernia repair, diagnostic laparoscopy and lysis of left upper quadrant adhesions.  Date: June 05, 2011  Pathology:na  HPI: He is here for his second postoperative visit. He reports having less groin swelling.  Still has a little soreness in the scrotal region bilaterally but it is better than last time.   Physical Exam: Harold Hall is in no acute distress.  Abdomen-well-healed small incisions with less swelling around the umbilicus.  GU-there is less scrotal swelling.   Assessment:  Progressing well postoperatively.  Swelling and soreness are decreasing. He is back at work.  Plan: Activities as tolerated. Return visit p.r.n.

## 2011-08-19 ENCOUNTER — Ambulatory Visit (INDEPENDENT_AMBULATORY_CARE_PROVIDER_SITE_OTHER): Payer: BC Managed Care – PPO | Admitting: Adult Health

## 2011-08-19 ENCOUNTER — Encounter: Payer: Self-pay | Admitting: Adult Health

## 2011-08-19 VITALS — BP 162/78 | HR 82 | Temp 98.2°F | Ht 74.0 in | Wt 274.2 lb

## 2011-08-19 DIAGNOSIS — J449 Chronic obstructive pulmonary disease, unspecified: Secondary | ICD-10-CM

## 2011-08-19 MED ORDER — CEFDINIR 300 MG PO CAPS: 300.0000 mg | ORAL_CAPSULE | Freq: Two times a day (BID) | ORAL | Status: AC

## 2011-08-19 MED ORDER — PREDNISONE 10 MG PO TABS: ORAL_TABLET | ORAL | Status: AC

## 2011-08-19 MED ORDER — ALBUTEROL SULFATE (5 MG/ML) 0.5% IN NEBU: 2.5000 mg | INHALATION_SOLUTION | Freq: Once | RESPIRATORY_TRACT | Status: DC

## 2011-08-19 NOTE — Assessment & Plan Note (Signed)
Flare with associated with activity desaturations He has hx of this in past, now on O2 only at At bedtime   Will have him wear with activity  Albuterol neb in office  Plan;  Omnicef 300mg  Twice daily  For 7 days  Mucinex DM Twice daily  As needed  Cough/congestion  Prednisone taper over next week  Wear Oxygen 2 l/m with activity and At bedtime   Please contact office for sooner follow up if symptoms do not improve or worsen or seek emergency care  follow up Dr. Shelle Iron in 2 weeks and As needed

## 2011-08-19 NOTE — Progress Notes (Signed)
  Subjective:    Patient ID: Harold Hall, male    DOB: 1946/07/18, 65 y.o.   MRN: [id number]  HPI 65 yo male with known hx of COPD followed by Dr. Shelle Iron   08/19/2011 Acute OV  Complains of Head and chest congestion for  1 week - Prod cough (clear-gray) - Occas wheezing - Increased SOB - Pt's oxygen sat was 82% upon 1st arriving to room but quickly recovered to 91% OTC not helping. No chest pain or edema.   Review of Systems Constitutional:   No  weight loss, night sweats,  Fevers, chills, fatigue, or  lassitude.  HEENT:   No headaches,  Difficulty swallowing,  Tooth/dental problems, or  Sore throat,                No sneezing, itching, ear ache, nasal congestion, post nasal drip,   CV:  No chest pain,  Orthopnea, PND, swelling in lower extremities, anasarca, dizziness, palpitations, syncope.   GI  No heartburn, indigestion, abdominal pain, nausea, vomiting, diarrhea, change in bowel habits, loss of appetite, bloody stools.   Resp:   No coughing up of blood.   No chest wall deformity  Skin: no rash or lesions.  GU: no dysuria, change in color of urine, no urgency or frequency.  No flank pain, no hematuria   MS:  No joint pain or swelling.  No decreased range of motion.  No back pain.  Psych:  No change in mood or affect. No depression or anxiety.  No memory loss.         Objective:   Physical Exam GEN: A/Ox3; pleasant , NAD,   HEENT:  /AT,  EACs-clear, TMs-wnl, NOSE-clear, THROAT-clear, no lesions, no postnasal drip or exudate noted.   NECK:  Supple w/ fair ROM; no JVD; normal carotid impulses w/o bruits; no thyromegaly or nodules palpated; no lymphadenopathy.  RESP  Coarse BS w/ faint exp wheeze no accessory muscle use, no dullness to percussion  CARD:  RRR, no m/r/g  , no peripheral edema, pulses intact, no cyanosis or clubbing.  GI:   Soft & nt; nml bowel sounds; no organomegaly or masses detected.  Musco: Warm bil, no deformities or joint swelling noted.     Neuro: alert, no focal deficits noted.    Skin: Warm, no lesions or rashes         Assessment & Plan:

## 2011-08-19 NOTE — Patient Instructions (Signed)
Omnicef 300mg  Twice daily  For 7 days  Mucinex DM Twice daily  As needed  Cough/congestion  Prednisone taper over next week  Wear Oxygen 2 l/m with activity and At bedtime   Please contact office for sooner follow up if symptoms do not improve or worsen or seek emergency care  follow up Dr. Shelle Iron in 2 weeks and As needed

## 2011-08-19 NOTE — Progress Notes (Signed)
Addended by: Marcellus Scott on: 08/19/2011 12:17 PM   Modules accepted: Orders

## 2011-08-19 NOTE — Progress Notes (Signed)
Note reviewed, and agree with plan as outlined.  

## 2011-09-03 ENCOUNTER — Other Ambulatory Visit: Payer: Self-pay | Admitting: Pulmonary Disease

## 2011-09-03 NOTE — Telephone Encounter (Signed)
Patient must keep OV on 10/08/2011 w/ Dr. Shelle Iron for any further refills. This refill request was for a 3 month supply to his mail order pharmacy and was sent with no additional refills.

## 2011-09-10 ENCOUNTER — Ambulatory Visit: Payer: BC Managed Care – PPO | Admitting: Pulmonary Disease

## 2011-10-08 ENCOUNTER — Ambulatory Visit: Payer: BC Managed Care – PPO | Admitting: Pulmonary Disease

## 2011-10-22 ENCOUNTER — Ambulatory Visit (INDEPENDENT_AMBULATORY_CARE_PROVIDER_SITE_OTHER): Payer: BC Managed Care – PPO | Admitting: Pulmonary Disease

## 2011-10-22 ENCOUNTER — Encounter: Payer: Self-pay | Admitting: Pulmonary Disease

## 2011-10-22 DIAGNOSIS — J069 Acute upper respiratory infection, unspecified: Secondary | ICD-10-CM | POA: Insufficient documentation

## 2011-10-22 DIAGNOSIS — J441 Chronic obstructive pulmonary disease with (acute) exacerbation: Secondary | ICD-10-CM

## 2011-10-22 DIAGNOSIS — J019 Acute sinusitis, unspecified: Secondary | ICD-10-CM

## 2011-10-22 MED ORDER — PREDNISONE 10 MG PO TABS: ORAL_TABLET | ORAL | Status: DC

## 2011-10-22 MED ORDER — LEVOFLOXACIN 750 MG PO TABS: 750.0000 mg | ORAL_TABLET | Freq: Every day | ORAL | Status: AC

## 2011-10-22 NOTE — Progress Notes (Signed)
  Subjective:    Patient ID: Harold Hall, male    DOB: 1946-03-01, 65 y.o.   MRN: [id number]  HPI The patient comes in today for an acute sick visit.  He has known emphysema with recurrent exacerbations due to ongoing smoking.  He has quit smoking in July of this year, but did have an episode of sinusitis and bronchitis with a flare of his COPD in October of this year.  He comes in today where again he has had sinus pressure and congestion, postnasal drip, and now chest congestion with cough of purulent mucus.  He also has chest tightness and increased shortness of breath.  He denies any fevers, chills, or sweats.   Review of Systems  Constitutional: Negative for fever and unexpected weight change.  HENT: Positive for congestion, sore throat, rhinorrhea, sneezing, postnasal drip and sinus pressure. Negative for ear pain, nosebleeds, trouble swallowing and dental problem.   Eyes: Negative for redness and itching.  Respiratory: Positive for cough, chest tightness and shortness of breath. Negative for wheezing.   Cardiovascular: Positive for leg swelling. Negative for palpitations.  Gastrointestinal: Negative for nausea and vomiting.  Genitourinary: Negative for dysuria.  Musculoskeletal: Negative for joint swelling.  Skin: Negative for rash.  Neurological: Negative for headaches.  Hematological: Does not bruise/bleed easily.  Psychiatric/Behavioral: Negative for dysphoric mood. The patient is not nervous/anxious.        Objective:   Physical Exam Obese male in no acute distress Nares with crusting and blood in his right nostril, left clear Oropharynx with no exudates, prominent uvula and palate Chest with decreased breath sounds, but no wheezes or rhonchi Cardiac exam with regular rate and rhythm Lower extremities with 1+ edema bilaterally, no cyanosis Alert and oriented, moves all 4 extremities.       Assessment & Plan:

## 2011-10-22 NOTE — Assessment & Plan Note (Signed)
The patient has a history of recurrent COPD exacerbation secondary to infections, but has not smoked since July of this year.  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  Will treat his current exacerbation with a course of prednisone, but if he has another in a short period of time, Will need a scan of his sinuses.

## 2011-10-22 NOTE — Assessment & Plan Note (Signed)
The patient's history today is most consistent with acute sinusitis leading to acute bronchitis.  The patient will need a course of antibiotics for this.  If he continues to have issues, or has a recurrence of symptoms quickly, he will need a scan of his sinuses.

## 2011-10-22 NOTE — Patient Instructions (Signed)
Will treat with levaquin 750mg  one each day for 7 days Prednisone taper as directed. Continue nasal rinses If you have another flareup in a short period of time, or if you do not improve, will need to image your sinuses. Will refer you to pulmonary rehab. Cancel upcoming apptm with me, and reschedule for 6mos.

## 2011-10-27 ENCOUNTER — Ambulatory Visit: Payer: Self-pay | Admitting: Pulmonary Disease

## 2011-11-11 ENCOUNTER — Ambulatory Visit (INDEPENDENT_AMBULATORY_CARE_PROVIDER_SITE_OTHER): Payer: BC Managed Care – PPO | Admitting: Pulmonary Disease

## 2011-11-11 ENCOUNTER — Other Ambulatory Visit: Payer: Self-pay | Admitting: Pulmonary Disease

## 2011-11-11 ENCOUNTER — Encounter: Payer: Self-pay | Admitting: Pulmonary Disease

## 2011-11-11 ENCOUNTER — Ambulatory Visit (INDEPENDENT_AMBULATORY_CARE_PROVIDER_SITE_OTHER)
Admission: RE | Admit: 2011-11-11 | Discharge: 2011-11-11 | Disposition: A | Discharge status: Home / Self Care | Payer: BC Managed Care – PPO | Source: Ambulatory Visit | Attending: Pulmonary Disease | Admitting: Pulmonary Disease

## 2011-11-11 DIAGNOSIS — J019 Acute sinusitis, unspecified: Secondary | ICD-10-CM

## 2011-11-11 DIAGNOSIS — J449 Chronic obstructive pulmonary disease, unspecified: Secondary | ICD-10-CM

## 2011-11-11 DIAGNOSIS — J4489 Other specified chronic obstructive pulmonary disease: Secondary | ICD-10-CM

## 2011-11-11 MED ORDER — ALPRAZOLAM 0.25 MG PO TABS: 0.2500 mg | ORAL_TABLET | Freq: Once | ORAL | Status: DC

## 2011-11-11 MED ORDER — CEFDINIR 300 MG PO CAPS: 600.0000 mg | ORAL_CAPSULE | Freq: Every day | ORAL | Status: AC

## 2011-11-11 NOTE — Progress Notes (Signed)
  Subjective:    Patient ID: Harold Hall, male    DOB: 02/06/1946, 66 y.o.   MRN: [id number]  HPI Patient comes in for a sick visit today.  He has known emphysema, but has been off cigarettes for the last 6 months.  He had a probable acute sinusitis and COPD exacerbation in October, and then again in December.  He was treated with antibiotics with some improvement, but as soon as these ran out, he is a return of his symptoms.  The patient has had increased cough with purulent-appearing mucus, chest congestion, and also ongoing sinus pressure and congestion.  He has had increased dyspnea on exertion, but this resolves quickly when he sits down and takes a break.  He denies any fevers, chills, or sweats.   Review of Systems  Constitutional: Negative for fever and unexpected weight change.  HENT: Positive for congestion, sore throat, rhinorrhea, trouble swallowing, postnasal drip and sinus pressure. Negative for ear pain, nosebleeds, sneezing and dental problem.   Eyes: Negative for redness and itching.  Respiratory: Positive for cough, chest tightness, shortness of breath and wheezing.   Cardiovascular: Positive for leg swelling. Negative for palpitations.  Gastrointestinal: Negative for nausea and vomiting.  Genitourinary: Negative for dysuria.  Musculoskeletal: Positive for joint swelling.  Skin: Negative for rash.  Neurological: Negative for headaches.  Hematological: Does not bruise/bleed easily.  Psychiatric/Behavioral: Negative for dysphoric mood. The patient is not nervous/anxious.        Objective:   Physical Exam Obese male in no acute distress Nose with erythema and edema left worse than right Oropharynx with no exudates or other abnormality Chest with decreased breath sounds, but adequate air flow and no wheezing Heart exam with regular rate and rhythm Lower extremities without edema, no cyanosis noted Alert and oriented, moves all 4 extremities.       Assessment &  Plan:

## 2011-11-11 NOTE — Progress Notes (Signed)
Addended by: Salli Quarry on: 11/11/2011 10:31 AM   Modules accepted: Orders

## 2011-11-11 NOTE — Patient Instructions (Signed)
Will image sinuses today to see if you have chronic sinusitis Will hold off on treatment until I see the results of the scan Please call us if you do not hear from pulm rehab in the next week or so. Work on weight loss and conditioning.

## 2011-11-11 NOTE — Progress Notes (Signed)
Addended by: Salli Quarry on: 11/11/2011 09:38 AM   Modules accepted: Orders

## 2011-11-11 NOTE — Assessment & Plan Note (Signed)
The patient is having some increased dyspnea on exertion, but he is moving air fairly freely on exam, with no wheezing.  He is also to stop smoking.  I am wondering if his sinuses are more of an issue, and we'll also check a chest x-ray today for completeness.  I think a lot of his dyspnea on exertion is related to his obesity, poor conditioning, and underlying lung disease.  He has been referred to pulmonary rehabilitation, and is awaiting their call.  I will hold off on prednisone at this time since there is no evidence for acute bronchospasm.

## 2011-11-11 NOTE — Assessment & Plan Note (Signed)
The patient is having another episode of cough with purulent mucus, and has had persistent sinus symptoms despite antibiotics earlier in the month.  I am wondering if he has chronic sinusitis, and it has never been fully treated.  Will schedule for CT of his sinuses, and proceed from there.

## 2011-11-30 ENCOUNTER — Encounter (HOSPITAL_COMMUNITY): Payer: Self-pay

## 2011-11-30 ENCOUNTER — Encounter (HOSPITAL_COMMUNITY)
Admission: RE | Admit: 2011-11-30 | Discharge: 2011-11-30 | Disposition: A | Discharge status: Home / Self Care | Payer: BC Managed Care – PPO | Source: Ambulatory Visit | Attending: Pulmonary Disease | Admitting: Pulmonary Disease

## 2011-11-30 DIAGNOSIS — J449 Chronic obstructive pulmonary disease, unspecified: Secondary | ICD-10-CM | POA: Insufficient documentation

## 2011-11-30 DIAGNOSIS — J984 Other disorders of lung: Secondary | ICD-10-CM | POA: Insufficient documentation

## 2011-11-30 DIAGNOSIS — J4489 Other specified chronic obstructive pulmonary disease: Secondary | ICD-10-CM | POA: Insufficient documentation

## 2011-11-30 DIAGNOSIS — Z5189 Encounter for other specified aftercare: Secondary | ICD-10-CM | POA: Insufficient documentation

## 2011-11-30 DIAGNOSIS — Z85528 Personal history of other malignant neoplasm of kidney: Secondary | ICD-10-CM | POA: Insufficient documentation

## 2011-11-30 DIAGNOSIS — G2581 Restless legs syndrome: Secondary | ICD-10-CM | POA: Insufficient documentation

## 2011-11-30 NOTE — Progress Notes (Signed)
Harold Hall came today for orientation for Pulmonary Rehab.  We discussed his low O2 Sats and the use of oxygen for exercise.  He is agreeable.   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 Demonstration and practice of PLB using pulse oximeter.  Patient able to return demonstration satisfactorily. Safety and hand hygiene in the exercise area reviewed with patient.  Patient voices understanding.We also did demonstration and practice of diaphramatic breathing.  He was able to return demonstration without difficulty.

## 2011-12-01 ENCOUNTER — Encounter (HOSPITAL_COMMUNITY)
Admission: RE | Admit: 2011-12-01 | Discharge: 2011-12-01 | Disposition: A | Discharge status: Home / Self Care | Payer: BC Managed Care – PPO | Source: Ambulatory Visit | Attending: Pulmonary Disease | Admitting: Pulmonary Disease

## 2011-12-03 ENCOUNTER — Encounter (HOSPITAL_COMMUNITY)
Admission: RE | Admit: 2011-12-03 | Discharge: 2011-12-03 | Disposition: A | Discharge status: Home / Self Care | Payer: BC Managed Care – PPO | Source: Ambulatory Visit | Attending: Pulmonary Disease | Admitting: Pulmonary Disease

## 2011-12-03 NOTE — Progress Notes (Signed)
Harold Hall began exercise in Pulmonary Rehab on Tues, 12-01-11. B/P was elevated today with exercise 180/80 on walking track.  More frequent rest breaks, increased oxygen to 4L for walking .   We will monitor next visit and if still elevated, contact MD.  Check out BP was 130/78.

## 2011-12-07 ENCOUNTER — Other Ambulatory Visit: Payer: Self-pay | Admitting: Pulmonary Disease

## 2011-12-08 ENCOUNTER — Encounter (HOSPITAL_COMMUNITY)
Admission: RE | Admit: 2011-12-08 | Discharge: 2011-12-08 | Disposition: A | Discharge status: Home / Self Care | Payer: BC Managed Care – PPO | Source: Ambulatory Visit | Attending: Pulmonary Disease | Admitting: Pulmonary Disease

## 2011-12-10 ENCOUNTER — Encounter (HOSPITAL_COMMUNITY)
Admission: RE | Admit: 2011-12-10 | Discharge: 2011-12-10 | Disposition: A | Discharge status: Home / Self Care | Payer: BC Managed Care – PPO | Source: Ambulatory Visit | Attending: Pulmonary Disease | Admitting: Pulmonary Disease

## 2011-12-15 ENCOUNTER — Encounter (HOSPITAL_COMMUNITY)
Admission: RE | Admit: 2011-12-15 | Discharge: 2011-12-15 | Disposition: A | Discharge status: Home / Self Care | Payer: BC Managed Care – PPO | Source: Ambulatory Visit | Attending: Pulmonary Disease | Admitting: Pulmonary Disease

## 2011-12-15 DIAGNOSIS — J449 Chronic obstructive pulmonary disease, unspecified: Secondary | ICD-10-CM | POA: Insufficient documentation

## 2011-12-15 DIAGNOSIS — Z5189 Encounter for other specified aftercare: Secondary | ICD-10-CM | POA: Insufficient documentation

## 2011-12-15 DIAGNOSIS — G2581 Restless legs syndrome: Secondary | ICD-10-CM | POA: Insufficient documentation

## 2011-12-15 DIAGNOSIS — Z85528 Personal history of other malignant neoplasm of kidney: Secondary | ICD-10-CM | POA: Insufficient documentation

## 2011-12-15 DIAGNOSIS — J4489 Other specified chronic obstructive pulmonary disease: Secondary | ICD-10-CM | POA: Insufficient documentation

## 2011-12-15 DIAGNOSIS — J984 Other disorders of lung: Secondary | ICD-10-CM | POA: Insufficient documentation

## 2011-12-15 NOTE — Progress Notes (Signed)
Harold Hall has had elevated BP readings in pulmonary rehab.  We have faxed reading to office, attention to [facility] for follow up.

## 2011-12-17 ENCOUNTER — Encounter (HOSPITAL_COMMUNITY)
Admission: RE | Admit: 2011-12-17 | Discharge: 2011-12-17 | Disposition: A | Discharge status: Home / Self Care | Payer: BC Managed Care – PPO | Source: Ambulatory Visit | Attending: Pulmonary Disease | Admitting: Pulmonary Disease

## 2011-12-17 NOTE — Progress Notes (Signed)
Nutrition Assessment Percell Belt  Time Spent: 15 min   66 y.o. male with COPD Past Medical History  Diagnosis Date  . Allergic rhinitis   . Hypertension   . RLS (restless legs syndrome)   . COPD (chronic obstructive pulmonary disease)   . Gout   . History of kidney cancer   . Arthritis   . Hernia   . Sinus problem   . Erectile dysfunction   . Hypercholesterolemia   . Cancer     left renal -solitary kidney   Current tobacco use? No Meds: Fiber supplement, Fish oil, MVI  Labs:  Lipid Panel  No results found for this basename: chol, trig, hdl, cholhdl, vldl, ldlcalc   HT 74 Ht Readings from Last 1 Encounters:  11/11/11 6\' 2"  (1.88 m)  WT   293 lb (133.2 kg) Wt Readings from Last 3 Encounters:  11/11/11 295 lb (133.811 kg)  10/22/11 288 lb (130.636 kg)  08/19/11 274 lb 3.2 oz (124.376 kg)  IBW    %IBW BMI 37.7   36.6%body fat  Assessment: Pt is obese. Pt reports he has gained 26 lbs over the past 6 months due to "tobacco cessation, surgery, semi-retirement, and decreased activity." Pt wt is up 19 lb over the past 3-4 months.  Pt is trying to lose wt by decreasing the amount and type of food consumed.  Pt is also "spreading out my meals." Pt eats 3 meals a day; most prepared at home.  There are some ways the pt can make his eating habits healthier.  Pt's Rate Your Plate results reviewed with pt.  Pt expressed understanding.  Pt does not avoid salty food; uses some regular canned food.  Pt had been adding salt to food until recently.  Pt states he "resisted decreasing the sodium in my diet until I had to do it."  Pt is now using Mrs. Dash to season foods and his wife is cooking with fresh, frozen, or no added salt canned vegetables.  The role of sodium in lung disease reviewed with pt. Per nutrition screen, pt c/o constipation.  Pt reports he increased his fiber consumption by increasing whole grains, drinking more fluid, and taking fiber pills.  Pt states his constipation problem  has been alleviated.  Pt reports he is allergic to onions, chocolate, and "sometimes" nuts.  These foods cause pt to cough.  Nutrition Diagnosis   Food-and nutrition-related knowledge deficit related to lack of exposure to information as related to diagnosis of pulmonary disease   Obesity related to excessive energy intake as evidenced by a BMI of 37.7 Nutrition Rx/Est. Daily Nutrition Needs for: ? wt loss 1850-2350 Kcal  110-130 gm protein   1500-2000 mg or less sodium  Nutrition Intervention   Pt's individual nutrition plan and goals reviewed with pt.   Benefits of adopting healthy eating habits discussed when pt's Rate Your Plate reviewed.   Pt to attend the Nutrition and Lung Disease class - met 12/03/11   Continual client-centered nutrition education by RD, as part of interdisciplinary care. Goal(s) 1. Pt to identify and limit food sources of sodium. 2. Identify food quantities necessary to achieve wt loss of  -2# per week to a goal wt of 122.3-127.7 kg ([phone] #) at graduation from pulmonary rehab. Monitor and Evaluate progress toward nutrition goal with team.

## 2011-12-17 NOTE — Progress Notes (Signed)
Pulmonary Rehab Nutrition Screen  Harold Hall 66 y.o. male             Ht: 74" Ht Readings from Last 1 Encounters:  11/11/11 6\' 2"  (1.88 m)    Wt:   293.0 lb (133.2 kg) Wt Readings from Last 3 Encounters:  11/11/11 295 lb (133.811 kg)  10/22/11 288 lb (130.636 kg)  08/19/11 274 lb 3.2 oz (124.376 kg)    BMI: 37.7  36.6%body fat                       Rate Your Plate Score: 44  Please answer the following questions:             YES  NO    Do you live in a nursing home?  X   Do you eat out more than 3 times per week?    X If yes, how many times per week do you eat out?   Do you have food allergies?  X  If yes, what are you allergic to? Onions, chocolate  Have you gained or lost more than 10 lbs without trying?              X  If yes, how much weight have you  lost or gained? 26 lbs over 6 months   Do you want to lose weight?    X  If yes, what is a goal weight or amount of weight you would like to lose? 50 lbs  Do you eat alone most of the time?  X    Do you eat less than 2 meals/day?  X If yes, how many meals do you eat?  Do you use canned and convenience food? X    Do you use a salt shaker? X    Do you drink more than 3 alcoholic drinks/day?  X If yes, how many drinks per day?  Are you having trouble with constipation? * X  If yes, what are you doing to help relieve constipation? Stool softner, Miralax, Laxatives  Do you have financial difficulties with buying food? *  X   Do you usually need help with grocery shopping or with cooking? *  X   Do you have a poor appetite? *                                       X   Do you have trouble chewing/ swallowing? *   X   Do you take vitamin and mineral or herbal supplements? * X  If yes, what kind of supplements do you currently take? MVI

## 2011-12-22 ENCOUNTER — Encounter (HOSPITAL_COMMUNITY)
Admission: RE | Admit: 2011-12-22 | Discharge: 2011-12-22 | Disposition: A | Discharge status: Home / Self Care | Payer: BC Managed Care – PPO | Source: Ambulatory Visit | Attending: Pulmonary Disease | Admitting: Pulmonary Disease

## 2011-12-22 NOTE — Progress Notes (Signed)
Harold Hall complained today of increasing fluid build up in his legs, up to knees.  He does have 1-2 plus edema.  Weight up 0.7 kg since 12-17-11.  Will notify MD.

## 2011-12-24 ENCOUNTER — Encounter (HOSPITAL_COMMUNITY)
Admission: RE | Admit: 2011-12-24 | Discharge: 2011-12-24 | Disposition: A | Discharge status: Home / Self Care | Payer: BC Managed Care – PPO | Source: Ambulatory Visit | Attending: Pulmonary Disease | Admitting: Pulmonary Disease

## 2011-12-29 ENCOUNTER — Encounter (HOSPITAL_COMMUNITY)
Admission: RE | Admit: 2011-12-29 | Discharge: 2011-12-29 | Disposition: A | Discharge status: Home / Self Care | Payer: BC Managed Care – PPO | Source: Ambulatory Visit | Attending: Pulmonary Disease | Admitting: Pulmonary Disease

## 2011-12-31 ENCOUNTER — Encounter (HOSPITAL_COMMUNITY)
Admission: RE | Admit: 2011-12-31 | Discharge: 2011-12-31 | Disposition: A | Discharge status: Home / Self Care | Payer: BC Managed Care – PPO | Source: Ambulatory Visit | Attending: Pulmonary Disease | Admitting: Pulmonary Disease

## 2012-01-05 ENCOUNTER — Encounter (HOSPITAL_COMMUNITY)
Admission: RE | Admit: 2012-01-05 | Discharge: 2012-01-05 | Disposition: A | Discharge status: Home / Self Care | Payer: BC Managed Care – PPO | Source: Ambulatory Visit | Attending: Pulmonary Disease | Admitting: Pulmonary Disease

## 2012-01-07 ENCOUNTER — Encounter (HOSPITAL_COMMUNITY)
Admission: RE | Admit: 2012-01-07 | Discharge: 2012-01-07 | Disposition: A | Discharge status: Home / Self Care | Payer: BC Managed Care – PPO | Source: Ambulatory Visit | Attending: Pulmonary Disease | Admitting: Pulmonary Disease

## 2012-01-12 ENCOUNTER — Encounter (HOSPITAL_COMMUNITY)
Admission: RE | Admit: 2012-01-12 | Discharge: 2012-01-12 | Disposition: A | Discharge status: Home / Self Care | Payer: BC Managed Care – PPO | Source: Ambulatory Visit | Attending: Pulmonary Disease | Admitting: Pulmonary Disease

## 2012-01-12 DIAGNOSIS — Z5189 Encounter for other specified aftercare: Secondary | ICD-10-CM | POA: Insufficient documentation

## 2012-01-12 DIAGNOSIS — J449 Chronic obstructive pulmonary disease, unspecified: Secondary | ICD-10-CM | POA: Insufficient documentation

## 2012-01-12 DIAGNOSIS — G2581 Restless legs syndrome: Secondary | ICD-10-CM | POA: Insufficient documentation

## 2012-01-12 DIAGNOSIS — J984 Other disorders of lung: Secondary | ICD-10-CM | POA: Insufficient documentation

## 2012-01-12 DIAGNOSIS — J4489 Other specified chronic obstructive pulmonary disease: Secondary | ICD-10-CM | POA: Insufficient documentation

## 2012-01-12 DIAGNOSIS — Z85528 Personal history of other malignant neoplasm of kidney: Secondary | ICD-10-CM | POA: Insufficient documentation

## 2012-01-14 ENCOUNTER — Encounter (HOSPITAL_COMMUNITY)
Admission: RE | Admit: 2012-01-14 | Discharge: 2012-01-14 | Disposition: A | Discharge status: Home / Self Care | Payer: BC Managed Care – PPO | Source: Ambulatory Visit | Attending: Pulmonary Disease | Admitting: Pulmonary Disease

## 2012-01-19 ENCOUNTER — Encounter (HOSPITAL_COMMUNITY)
Admission: RE | Admit: 2012-01-19 | Discharge: 2012-01-19 | Disposition: A | Discharge status: Home / Self Care | Payer: BC Managed Care – PPO | Source: Ambulatory Visit | Attending: Pulmonary Disease | Admitting: Pulmonary Disease

## 2012-01-21 ENCOUNTER — Encounter (HOSPITAL_COMMUNITY)
Admission: RE | Admit: 2012-01-21 | Discharge: 2012-01-21 | Disposition: A | Discharge status: Home / Self Care | Payer: BC Managed Care – PPO | Source: Ambulatory Visit | Attending: Pulmonary Disease | Admitting: Pulmonary Disease

## 2012-01-26 ENCOUNTER — Encounter (HOSPITAL_COMMUNITY): Payer: BC Managed Care – PPO

## 2012-01-28 ENCOUNTER — Encounter (HOSPITAL_COMMUNITY)
Admission: RE | Admit: 2012-01-28 | Discharge: 2012-01-28 | Disposition: A | Discharge status: Home / Self Care | Payer: BC Managed Care – PPO | Source: Ambulatory Visit | Attending: Pulmonary Disease | Admitting: Pulmonary Disease

## 2012-01-28 NOTE — Progress Notes (Signed)
Completed home exercise with patient. Reviewed exercise progression, routine, exercising at a comfortable pace, RPE/Dyspnea scales, how important it is to own a pulse oximeter and how to use one, weather conditions, warning signs and symptoms with exercise, and CP/NTG. We discussed when to call MD. Patient has a goal to walk further distances. Gave patient a new exercise prescription for rehab class-patient will now walk for 2 stations for a total of 30 minutes to help build endurance. We have discussed safe exercise parameters for walking at home. Patient voiced understanding.  Will continue to encourage and support.

## 2012-02-02 ENCOUNTER — Encounter (HOSPITAL_COMMUNITY)
Admission: RE | Admit: 2012-02-02 | Discharge: 2012-02-02 | Disposition: A | Discharge status: Home / Self Care | Payer: BC Managed Care – PPO | Source: Ambulatory Visit | Attending: Pulmonary Disease | Admitting: Pulmonary Disease

## 2012-02-04 ENCOUNTER — Encounter (HOSPITAL_COMMUNITY)
Admission: RE | Admit: 2012-02-04 | Discharge: 2012-02-04 | Disposition: A | Discharge status: Home / Self Care | Payer: BC Managed Care – PPO | Source: Ambulatory Visit | Attending: Pulmonary Disease | Admitting: Pulmonary Disease

## 2012-02-09 ENCOUNTER — Encounter (HOSPITAL_COMMUNITY)
Admission: RE | Admit: 2012-02-09 | Discharge: 2012-02-09 | Disposition: A | Discharge status: Home / Self Care | Payer: BC Managed Care – PPO | Source: Ambulatory Visit | Attending: Pulmonary Disease | Admitting: Pulmonary Disease

## 2012-02-09 DIAGNOSIS — G2581 Restless legs syndrome: Secondary | ICD-10-CM | POA: Insufficient documentation

## 2012-02-09 DIAGNOSIS — Z5189 Encounter for other specified aftercare: Secondary | ICD-10-CM | POA: Insufficient documentation

## 2012-02-09 DIAGNOSIS — J449 Chronic obstructive pulmonary disease, unspecified: Secondary | ICD-10-CM | POA: Insufficient documentation

## 2012-02-09 DIAGNOSIS — J4489 Other specified chronic obstructive pulmonary disease: Secondary | ICD-10-CM | POA: Insufficient documentation

## 2012-02-09 DIAGNOSIS — J984 Other disorders of lung: Secondary | ICD-10-CM | POA: Insufficient documentation

## 2012-02-09 DIAGNOSIS — Z85528 Personal history of other malignant neoplasm of kidney: Secondary | ICD-10-CM | POA: Insufficient documentation

## 2012-02-11 ENCOUNTER — Encounter (HOSPITAL_COMMUNITY)
Admission: RE | Admit: 2012-02-11 | Discharge: 2012-02-11 | Disposition: A | Discharge status: Home / Self Care | Payer: BC Managed Care – PPO | Source: Ambulatory Visit | Attending: Pulmonary Disease | Admitting: Pulmonary Disease

## 2012-02-11 NOTE — Progress Notes (Signed)
Time spent: 15 minutes Spoke with pt and pt's wife separately. Pt feels he should be able to add salt at the table since his wife "doesn't add any salt when she cooks." Per pt wife, she doesn't add salt while cooking, but pt's wife uses regular canned vegetables (e.g. corn) because the pt eats "very few vegetables." Pt uses vinegar, onion/garlic pepper, Mrs. Dash, and hot sauce to add flavor to food. Sodium content of hot sauce discussed. Pt and pt wife expressed understanding of low sodium diet recommendations. Continue client-centered nutrition education by RD as part of interdisciplinary care.  Monitor and evaluate progress toward nutrition goal with team.

## 2012-02-16 ENCOUNTER — Encounter (HOSPITAL_COMMUNITY)
Admission: RE | Admit: 2012-02-16 | Discharge: 2012-02-16 | Disposition: A | Discharge status: Home / Self Care | Payer: BC Managed Care – PPO | Source: Ambulatory Visit | Attending: Pulmonary Disease | Admitting: Pulmonary Disease

## 2012-02-18 ENCOUNTER — Encounter (HOSPITAL_COMMUNITY)
Admission: RE | Admit: 2012-02-18 | Discharge: 2012-02-18 | Disposition: A | Discharge status: Home / Self Care | Payer: BC Managed Care – PPO | Source: Ambulatory Visit | Attending: Pulmonary Disease | Admitting: Pulmonary Disease

## 2012-02-23 ENCOUNTER — Encounter (HOSPITAL_COMMUNITY)
Admission: RE | Admit: 2012-02-23 | Discharge: 2012-02-23 | Disposition: A | Discharge status: Home / Self Care | Payer: BC Managed Care – PPO | Source: Ambulatory Visit | Attending: Pulmonary Disease | Admitting: Pulmonary Disease

## 2012-02-23 NOTE — Progress Notes (Addendum)
Pulmonary Rehab   Pt has completed pulmonary rehab undergrad program with goals met.  Pt to start pulmonary rehab maintenance  03/01/12 orientation paper work done with understanding.   Medications reviewed.   Rosalie Doctor

## 2012-02-25 ENCOUNTER — Encounter (HOSPITAL_COMMUNITY)
Admission: RE | Admit: 2012-02-25 | Discharge: 2012-02-25 | Disposition: A | Discharge status: Home / Self Care | Payer: BC Managed Care – PPO | Source: Ambulatory Visit | Attending: Pulmonary Disease | Admitting: Pulmonary Disease

## 2012-02-25 NOTE — Progress Notes (Addendum)
Mr Tomaso completed Pulmonary Rehab undergrad program today, meeting his goals,   We are still concerned about elevated BP readings, which have been sent to MD.  He will begin maintenance class on 03-04-12 and we will reevaluate his work loads and continue to monitor BPs.  He is compliant with diet, meds and exercise.                             Pulmonary Rehabilitation Program Outcomes Report   Orientation:  11/30/2011 Graduate Date:  02/25/2012 Discharge Date:  02/25/2012 # of sessions completed: 25/25  Pulmonologist: Clance   Class Time:  10:30am  A.  Exercise Program:  Tolerates exercise @ 3.30 METS for 45 minutes, Walk Test Results:  Pre: 564 ft. and Post: 1600 ft, Improved functional capacity  183.69 %, Improved  muscular strength  30.00 %, Improved dyspnea score 29.0 %, No Change education score 0.00 %, Patient plans to continue pulmonary rehab maintenance program, Discharged to home exercise program.  Anticipated compliance:  good and Discharged  B.  Mental Health:  Quality of Life (QOL)  improvements:  Overall  39.61 %, Health/Functioning 99.21 %, Socioeconomics 5.91 %, Psych/Spiritual 51.88 %, Family 0.00 %    C.  Education/Instruction/Skills  Uses Perceived Exertion Scale and/or Dyspnea Scale and Attended 9 education classes  Home exercise given: 01/28/2012 All goals accomplished.   D.  Nutrition/Weight Control/Body Composition:  % Body Fat  33.5 and Patient has lost 1.4 kg   E.  Blood Lipids    No results found for this basename: CHOL, HDL, LDLCALC, LDLDIRECT, TRIG, CHOLHDL    F.  Lifestyle Changes:  Making positive lifestyle changes  G.  Symptoms noted with exercise:  Shortness of breath, Dizziness, Fatigue, Resting hypertension and Exertional hypertension     Comments:  Patient has completed the Pulmonary Rehab undergrad program. Patient has accomplished all personal and program goals. Patient vitals stable throughout program. Patient able to maintain SPaO2  greater than or equal to 94% on 4LNCC. Patient BP readings were a concern and RN discussed with MD for further treatment. Patient overall satisfaction was good. Patient plans to continue exercise with our Pulmonary Maintenance Program. Very compliant and great to work with.   Scotty Pinder L. Manson Passey, MS, NASM, CES

## 2012-03-01 ENCOUNTER — Encounter (HOSPITAL_COMMUNITY)
Admission: RE | Admit: 2012-03-01 | Discharge: 2012-03-01 | Disposition: A | Discharge status: Home / Self Care | Payer: BC Managed Care – PPO | Source: Ambulatory Visit | Attending: Pulmonary Disease | Admitting: Pulmonary Disease

## 2012-03-01 ENCOUNTER — Encounter (HOSPITAL_COMMUNITY): Payer: BC Managed Care – PPO

## 2012-03-01 NOTE — Progress Notes (Signed)
Pulmonary Rehab Maintenance Program  Harold Hall attended pulmonary rehab maintenance for his  first session today 03/01/12. Tolerated well. VSS. 02 SATS range 92-95% on 2L 02. Blood pressures ranged 130s - 150s / 60s - 80s. No complaints SOB or CP.    Rosalie Doctor

## 2012-03-01 NOTE — Progress Notes (Signed)
Pulmonary Maintenance   Patient tried the treadmill today at a speed of 1.1 and no incline for the first time. Patient did very well. O2 sats went as low as 88% on 2L NCC. With rest and PLB SPaO2 increased to 93% 2L NCC.  Patient BP was stable. Patient stated "he would continue to do treadmill for 15 mins while taking rest breaks for one of his exercise stations, each day he came to rehab". Will continue to encourage and support.   Harold Hall L. Manson Passey, MS, NASM, CES

## 2012-03-03 ENCOUNTER — Encounter (HOSPITAL_COMMUNITY): Payer: BC Managed Care – PPO

## 2012-03-03 ENCOUNTER — Encounter (HOSPITAL_COMMUNITY)
Admission: RE | Admit: 2012-03-03 | Discharge: 2012-03-03 | Disposition: A | Discharge status: Home / Self Care | Payer: BC Managed Care – PPO | Source: Ambulatory Visit | Attending: Pulmonary Disease | Admitting: Pulmonary Disease

## 2012-03-03 NOTE — Progress Notes (Signed)
Addendum to Nutrition Section of Pulmonary Rehab Program Progress Report  Pt with 8.47% decrease in % body fat. Compliance with diet: fair.

## 2012-03-08 ENCOUNTER — Encounter (HOSPITAL_COMMUNITY)
Admission: RE | Admit: 2012-03-08 | Discharge: 2012-03-08 | Disposition: A | Discharge status: Home / Self Care | Payer: BC Managed Care – PPO | Source: Ambulatory Visit | Attending: Pulmonary Disease | Admitting: Pulmonary Disease

## 2012-03-08 ENCOUNTER — Encounter (HOSPITAL_COMMUNITY): Payer: BC Managed Care – PPO

## 2012-03-10 ENCOUNTER — Encounter (HOSPITAL_COMMUNITY)
Admission: RE | Admit: 2012-03-10 | Discharge: 2012-03-10 | Disposition: A | Discharge status: Home / Self Care | Payer: BC Managed Care – PPO | Source: Ambulatory Visit | Attending: Pulmonary Disease | Admitting: Pulmonary Disease

## 2012-03-10 ENCOUNTER — Encounter (HOSPITAL_COMMUNITY): Payer: BC Managed Care – PPO

## 2012-03-10 DIAGNOSIS — G2581 Restless legs syndrome: Secondary | ICD-10-CM | POA: Insufficient documentation

## 2012-03-10 DIAGNOSIS — J4489 Other specified chronic obstructive pulmonary disease: Secondary | ICD-10-CM | POA: Insufficient documentation

## 2012-03-10 DIAGNOSIS — J984 Other disorders of lung: Secondary | ICD-10-CM | POA: Insufficient documentation

## 2012-03-10 DIAGNOSIS — Z85528 Personal history of other malignant neoplasm of kidney: Secondary | ICD-10-CM | POA: Insufficient documentation

## 2012-03-10 DIAGNOSIS — Z5189 Encounter for other specified aftercare: Secondary | ICD-10-CM | POA: Insufficient documentation

## 2012-03-10 DIAGNOSIS — J449 Chronic obstructive pulmonary disease, unspecified: Secondary | ICD-10-CM | POA: Insufficient documentation

## 2012-03-15 ENCOUNTER — Encounter (HOSPITAL_COMMUNITY): Payer: BC Managed Care – PPO

## 2012-03-15 ENCOUNTER — Encounter (HOSPITAL_COMMUNITY)
Admission: RE | Admit: 2012-03-15 | Discharge: 2012-03-15 | Disposition: A | Discharge status: Home / Self Care | Payer: BC Managed Care – PPO | Source: Ambulatory Visit | Attending: Pulmonary Disease | Admitting: Pulmonary Disease

## 2012-03-17 ENCOUNTER — Encounter (HOSPITAL_COMMUNITY): Payer: BC Managed Care – PPO

## 2012-03-17 ENCOUNTER — Encounter (HOSPITAL_COMMUNITY)
Admission: RE | Admit: 2012-03-17 | Discharge: 2012-03-17 | Disposition: A | Discharge status: Home / Self Care | Payer: BC Managed Care – PPO | Source: Ambulatory Visit | Attending: Pulmonary Disease | Admitting: Pulmonary Disease

## 2012-03-22 ENCOUNTER — Encounter (HOSPITAL_COMMUNITY)
Admission: RE | Admit: 2012-03-22 | Discharge: 2012-03-22 | Disposition: A | Discharge status: Home / Self Care | Payer: BC Managed Care – PPO | Source: Ambulatory Visit | Attending: Pulmonary Disease | Admitting: Pulmonary Disease

## 2012-03-24 ENCOUNTER — Encounter (HOSPITAL_COMMUNITY): Admission: RE | Admit: 2012-03-24 | Payer: BC Managed Care – PPO | Source: Ambulatory Visit

## 2012-03-29 ENCOUNTER — Encounter (HOSPITAL_COMMUNITY)
Admission: RE | Admit: 2012-03-29 | Discharge: 2012-03-29 | Disposition: A | Discharge status: Home / Self Care | Payer: BC Managed Care – PPO | Source: Ambulatory Visit | Attending: Pulmonary Disease | Admitting: Pulmonary Disease

## 2012-03-31 ENCOUNTER — Encounter (HOSPITAL_COMMUNITY)
Admission: RE | Admit: 2012-03-31 | Discharge: 2012-03-31 | Disposition: A | Discharge status: Home / Self Care | Payer: BC Managed Care – PPO | Source: Ambulatory Visit | Attending: Pulmonary Disease | Admitting: Pulmonary Disease

## 2012-04-05 ENCOUNTER — Encounter (HOSPITAL_COMMUNITY)
Admission: RE | Admit: 2012-04-05 | Discharge: 2012-04-05 | Disposition: A | Discharge status: Home / Self Care | Payer: BC Managed Care – PPO | Source: Ambulatory Visit | Attending: Pulmonary Disease | Admitting: Pulmonary Disease

## 2012-04-07 ENCOUNTER — Encounter (HOSPITAL_COMMUNITY)
Admission: RE | Admit: 2012-04-07 | Discharge: 2012-04-07 | Disposition: A | Discharge status: Home / Self Care | Payer: BC Managed Care – PPO | Source: Ambulatory Visit | Attending: Pulmonary Disease | Admitting: Pulmonary Disease

## 2012-04-12 ENCOUNTER — Encounter (HOSPITAL_COMMUNITY)
Admission: RE | Admit: 2012-04-12 | Discharge: 2012-04-12 | Disposition: A | Discharge status: Home / Self Care | Payer: BC Managed Care – PPO | Source: Ambulatory Visit | Attending: Pulmonary Disease | Admitting: Pulmonary Disease

## 2012-04-12 DIAGNOSIS — J449 Chronic obstructive pulmonary disease, unspecified: Secondary | ICD-10-CM | POA: Insufficient documentation

## 2012-04-12 DIAGNOSIS — Z5189 Encounter for other specified aftercare: Secondary | ICD-10-CM | POA: Insufficient documentation

## 2012-04-12 DIAGNOSIS — Z85528 Personal history of other malignant neoplasm of kidney: Secondary | ICD-10-CM | POA: Insufficient documentation

## 2012-04-12 DIAGNOSIS — J984 Other disorders of lung: Secondary | ICD-10-CM | POA: Insufficient documentation

## 2012-04-12 DIAGNOSIS — J4489 Other specified chronic obstructive pulmonary disease: Secondary | ICD-10-CM | POA: Insufficient documentation

## 2012-04-12 DIAGNOSIS — G2581 Restless legs syndrome: Secondary | ICD-10-CM | POA: Insufficient documentation

## 2012-04-14 ENCOUNTER — Encounter (HOSPITAL_COMMUNITY)
Admission: RE | Admit: 2012-04-14 | Discharge: 2012-04-14 | Disposition: A | Discharge status: Home / Self Care | Payer: BC Managed Care – PPO | Source: Ambulatory Visit | Attending: Pulmonary Disease | Admitting: Pulmonary Disease

## 2012-04-19 ENCOUNTER — Encounter (HOSPITAL_COMMUNITY)
Admission: RE | Admit: 2012-04-19 | Discharge: 2012-04-19 | Disposition: A | Discharge status: Home / Self Care | Payer: BC Managed Care – PPO | Source: Ambulatory Visit | Attending: Pulmonary Disease | Admitting: Pulmonary Disease

## 2012-04-21 ENCOUNTER — Encounter: Payer: Self-pay | Admitting: Pulmonary Disease

## 2012-04-21 ENCOUNTER — Encounter (HOSPITAL_COMMUNITY): Admission: RE | Admit: 2012-04-21 | Payer: BC Managed Care – PPO | Source: Ambulatory Visit

## 2012-04-21 ENCOUNTER — Ambulatory Visit (INDEPENDENT_AMBULATORY_CARE_PROVIDER_SITE_OTHER): Payer: BC Managed Care – PPO | Admitting: Pulmonary Disease

## 2012-04-21 VITALS — BP 138/82 | HR 66 | Temp 97.8°F | Ht 74.0 in | Wt 297.0 lb

## 2012-04-21 DIAGNOSIS — J449 Chronic obstructive pulmonary disease, unspecified: Secondary | ICD-10-CM

## 2012-04-21 NOTE — Patient Instructions (Addendum)
No change in medications Continue in pulmonary rehab, work on weight loss followup with me in 6mos.

## 2012-04-21 NOTE — Progress Notes (Signed)
  Subjective:    Patient ID: Harold Hall, male    DOB: [birth year], 66 y.o.   MRN: [id number]  HPI The patient comes in today for followup of his known COPD.  He has continued to stay off cigarettes, and has been participating regularly in pulmonary rehabilitation.  He has stayed on his current bronchodilator regimen, and has not had a recent acute exacerbation.  He is not requiring his rescue inhaler very frequently.  He still has shortness of breath at times, but overall is staying within his normal range.   Review of Systems  Constitutional: Negative.  Negative for fever and unexpected weight change.  HENT: Positive for congestion. Negative for ear pain, nosebleeds, sore throat, rhinorrhea, sneezing, trouble swallowing, dental problem, postnasal drip and sinus pressure.   Eyes: Negative.  Negative for redness and itching.  Respiratory: Positive for cough and shortness of breath. Negative for chest tightness and wheezing.   Cardiovascular: Positive for leg swelling. Negative for palpitations.  Gastrointestinal: Negative.  Negative for nausea and vomiting.  Genitourinary: Negative.  Negative for dysuria.  Musculoskeletal: Negative.  Negative for joint swelling.  Skin: Negative.  Negative for rash.  Neurological: Negative.  Negative for headaches.  Hematological: Negative.  Does not bruise/bleed easily.  Psychiatric/Behavioral: Negative.  Negative for dysphoric mood. The patient is not nervous/anxious.        Objective:   Physical Exam Obese male in no acute distress Nose without purulent discharge noted Chest with mild decrease in breath sounds, no wheezes or rhonchi. Cardiac exam is regular rate and rhythm Lower extremities with 1+ edema bilaterally, no cyanosis Cord and oriented, moves all 4 extremities.      Assessment & Plan:

## 2012-04-21 NOTE — Assessment & Plan Note (Signed)
The pt is doing very well from a pulmonary standpoint.  He is off cigs, and is participating in pulmonary rehab.  He has not had a recent acute exacerbation, and feels his breathing is stable. I have encouraged him to work aggressively on weight loss, since this will have the biggest impact on his QOL at this point. He is to continue on his current medications.

## 2012-04-26 ENCOUNTER — Encounter (HOSPITAL_COMMUNITY)
Admission: RE | Admit: 2012-04-26 | Discharge: 2012-04-26 | Disposition: A | Discharge status: Home / Self Care | Payer: BC Managed Care – PPO | Source: Ambulatory Visit | Attending: Pulmonary Disease | Admitting: Pulmonary Disease

## 2012-04-28 ENCOUNTER — Encounter (HOSPITAL_COMMUNITY): Payer: BC Managed Care – PPO

## 2012-05-03 ENCOUNTER — Encounter (HOSPITAL_COMMUNITY): Payer: BC Managed Care – PPO

## 2012-05-05 ENCOUNTER — Encounter (HOSPITAL_COMMUNITY): Payer: BC Managed Care – PPO

## 2012-05-10 ENCOUNTER — Encounter (HOSPITAL_COMMUNITY): Payer: BC Managed Care – PPO

## 2012-05-10 DIAGNOSIS — Z85528 Personal history of other malignant neoplasm of kidney: Secondary | ICD-10-CM | POA: Insufficient documentation

## 2012-05-10 DIAGNOSIS — J4489 Other specified chronic obstructive pulmonary disease: Secondary | ICD-10-CM | POA: Insufficient documentation

## 2012-05-10 DIAGNOSIS — J984 Other disorders of lung: Secondary | ICD-10-CM | POA: Insufficient documentation

## 2012-05-10 DIAGNOSIS — G2581 Restless legs syndrome: Secondary | ICD-10-CM | POA: Insufficient documentation

## 2012-05-10 DIAGNOSIS — J449 Chronic obstructive pulmonary disease, unspecified: Secondary | ICD-10-CM | POA: Insufficient documentation

## 2012-05-10 DIAGNOSIS — Z5189 Encounter for other specified aftercare: Secondary | ICD-10-CM | POA: Insufficient documentation

## 2012-05-12 ENCOUNTER — Encounter (HOSPITAL_COMMUNITY): Payer: BC Managed Care – PPO

## 2012-05-17 ENCOUNTER — Encounter (HOSPITAL_COMMUNITY)
Admission: RE | Admit: 2012-05-17 | Discharge: 2012-05-17 | Disposition: A | Discharge status: Home / Self Care | Payer: BC Managed Care – PPO | Source: Ambulatory Visit | Attending: Pulmonary Disease | Admitting: Pulmonary Disease

## 2012-05-19 ENCOUNTER — Encounter (HOSPITAL_COMMUNITY)
Admission: RE | Admit: 2012-05-19 | Discharge: 2012-05-19 | Disposition: A | Discharge status: Home / Self Care | Payer: BC Managed Care – PPO | Source: Ambulatory Visit | Attending: Pulmonary Disease | Admitting: Pulmonary Disease

## 2012-05-24 ENCOUNTER — Encounter (HOSPITAL_COMMUNITY)
Admission: RE | Admit: 2012-05-24 | Discharge: 2012-05-24 | Disposition: A | Discharge status: Home / Self Care | Payer: BC Managed Care – PPO | Source: Ambulatory Visit | Attending: Pulmonary Disease | Admitting: Pulmonary Disease

## 2012-05-26 ENCOUNTER — Encounter (HOSPITAL_COMMUNITY)
Admission: RE | Admit: 2012-05-26 | Discharge: 2012-05-26 | Disposition: A | Discharge status: Home / Self Care | Payer: BC Managed Care – PPO | Source: Ambulatory Visit | Attending: Pulmonary Disease | Admitting: Pulmonary Disease

## 2012-05-31 ENCOUNTER — Encounter (HOSPITAL_COMMUNITY)
Admission: RE | Admit: 2012-05-31 | Discharge: 2012-05-31 | Disposition: A | Discharge status: Home / Self Care | Payer: BC Managed Care – PPO | Source: Ambulatory Visit | Attending: Pulmonary Disease | Admitting: Pulmonary Disease

## 2012-06-02 ENCOUNTER — Encounter (HOSPITAL_COMMUNITY)
Admission: RE | Admit: 2012-06-02 | Discharge: 2012-06-02 | Disposition: A | Discharge status: Home / Self Care | Payer: BC Managed Care – PPO | Source: Ambulatory Visit | Attending: Pulmonary Disease | Admitting: Pulmonary Disease

## 2012-06-07 ENCOUNTER — Encounter (HOSPITAL_COMMUNITY)
Admission: RE | Admit: 2012-06-07 | Discharge: 2012-06-07 | Disposition: A | Discharge status: Home / Self Care | Payer: BC Managed Care – PPO | Source: Ambulatory Visit | Attending: Pulmonary Disease | Admitting: Pulmonary Disease

## 2012-06-09 ENCOUNTER — Encounter (HOSPITAL_COMMUNITY)
Admission: RE | Admit: 2012-06-09 | Discharge: 2012-06-09 | Disposition: A | Discharge status: Home / Self Care | Payer: BC Managed Care – PPO | Source: Ambulatory Visit | Attending: Pulmonary Disease | Admitting: Pulmonary Disease

## 2012-06-09 DIAGNOSIS — Z5189 Encounter for other specified aftercare: Secondary | ICD-10-CM | POA: Insufficient documentation

## 2012-06-09 DIAGNOSIS — J4489 Other specified chronic obstructive pulmonary disease: Secondary | ICD-10-CM | POA: Insufficient documentation

## 2012-06-09 DIAGNOSIS — J449 Chronic obstructive pulmonary disease, unspecified: Secondary | ICD-10-CM | POA: Insufficient documentation

## 2012-06-09 DIAGNOSIS — G2581 Restless legs syndrome: Secondary | ICD-10-CM | POA: Insufficient documentation

## 2012-06-09 DIAGNOSIS — J984 Other disorders of lung: Secondary | ICD-10-CM | POA: Insufficient documentation

## 2012-06-09 DIAGNOSIS — Z85528 Personal history of other malignant neoplasm of kidney: Secondary | ICD-10-CM | POA: Insufficient documentation

## 2012-06-14 ENCOUNTER — Encounter (HOSPITAL_COMMUNITY)
Admission: RE | Admit: 2012-06-14 | Discharge: 2012-06-14 | Disposition: A | Discharge status: Home / Self Care | Payer: BC Managed Care – PPO | Source: Ambulatory Visit | Attending: Pulmonary Disease | Admitting: Pulmonary Disease

## 2012-06-16 ENCOUNTER — Encounter (HOSPITAL_COMMUNITY)
Admission: RE | Admit: 2012-06-16 | Discharge: 2012-06-16 | Disposition: A | Discharge status: Home / Self Care | Payer: BC Managed Care – PPO | Source: Ambulatory Visit | Attending: Pulmonary Disease | Admitting: Pulmonary Disease

## 2012-06-21 ENCOUNTER — Encounter (HOSPITAL_COMMUNITY)
Admission: RE | Admit: 2012-06-21 | Discharge: 2012-06-21 | Disposition: A | Discharge status: Home / Self Care | Payer: BC Managed Care – PPO | Source: Ambulatory Visit | Attending: Pulmonary Disease | Admitting: Pulmonary Disease

## 2012-06-23 ENCOUNTER — Encounter (HOSPITAL_COMMUNITY): Payer: BC Managed Care – PPO

## 2012-06-28 ENCOUNTER — Encounter (HOSPITAL_COMMUNITY)
Admission: RE | Admit: 2012-06-28 | Discharge: 2012-06-28 | Disposition: A | Discharge status: Home / Self Care | Payer: BC Managed Care – PPO | Source: Ambulatory Visit | Attending: Pulmonary Disease | Admitting: Pulmonary Disease

## 2012-06-30 ENCOUNTER — Encounter (HOSPITAL_COMMUNITY)
Admission: RE | Admit: 2012-06-30 | Discharge: 2012-06-30 | Disposition: A | Discharge status: Home / Self Care | Payer: BC Managed Care – PPO | Source: Ambulatory Visit | Attending: Pulmonary Disease | Admitting: Pulmonary Disease

## 2012-07-03 ENCOUNTER — Other Ambulatory Visit: Payer: Self-pay | Admitting: Pulmonary Disease

## 2012-07-05 ENCOUNTER — Encounter (HOSPITAL_COMMUNITY)
Admission: RE | Admit: 2012-07-05 | Discharge: 2012-07-05 | Disposition: A | Discharge status: Home / Self Care | Payer: BC Managed Care – PPO | Source: Ambulatory Visit | Attending: Pulmonary Disease | Admitting: Pulmonary Disease

## 2012-07-07 ENCOUNTER — Encounter (HOSPITAL_COMMUNITY)
Admission: RE | Admit: 2012-07-07 | Discharge: 2012-07-07 | Disposition: A | Discharge status: Home / Self Care | Payer: BC Managed Care – PPO | Source: Ambulatory Visit | Attending: Pulmonary Disease | Admitting: Pulmonary Disease

## 2012-07-12 ENCOUNTER — Encounter (HOSPITAL_COMMUNITY)
Admission: RE | Admit: 2012-07-12 | Discharge: 2012-07-12 | Disposition: A | Discharge status: Home / Self Care | Payer: BC Managed Care – PPO | Source: Ambulatory Visit | Attending: Pulmonary Disease | Admitting: Pulmonary Disease

## 2012-07-12 DIAGNOSIS — J449 Chronic obstructive pulmonary disease, unspecified: Secondary | ICD-10-CM | POA: Insufficient documentation

## 2012-07-12 DIAGNOSIS — J4489 Other specified chronic obstructive pulmonary disease: Secondary | ICD-10-CM | POA: Insufficient documentation

## 2012-07-12 DIAGNOSIS — Z5189 Encounter for other specified aftercare: Secondary | ICD-10-CM | POA: Insufficient documentation

## 2012-07-12 DIAGNOSIS — J984 Other disorders of lung: Secondary | ICD-10-CM | POA: Insufficient documentation

## 2012-07-12 DIAGNOSIS — G2581 Restless legs syndrome: Secondary | ICD-10-CM | POA: Insufficient documentation

## 2012-07-12 DIAGNOSIS — Z85528 Personal history of other malignant neoplasm of kidney: Secondary | ICD-10-CM | POA: Insufficient documentation

## 2012-07-14 ENCOUNTER — Encounter (HOSPITAL_COMMUNITY)
Admission: RE | Admit: 2012-07-14 | Discharge: 2012-07-14 | Disposition: A | Discharge status: Home / Self Care | Payer: BC Managed Care – PPO | Source: Ambulatory Visit | Attending: Pulmonary Disease | Admitting: Pulmonary Disease

## 2012-07-17 ENCOUNTER — Other Ambulatory Visit: Payer: Self-pay | Admitting: Pulmonary Disease

## 2012-07-19 ENCOUNTER — Encounter (HOSPITAL_COMMUNITY)
Admission: RE | Admit: 2012-07-19 | Discharge: 2012-07-19 | Disposition: A | Discharge status: Home / Self Care | Payer: BC Managed Care – PPO | Source: Ambulatory Visit | Attending: Pulmonary Disease | Admitting: Pulmonary Disease

## 2012-07-21 ENCOUNTER — Encounter (HOSPITAL_COMMUNITY)
Admission: RE | Admit: 2012-07-21 | Discharge: 2012-07-21 | Disposition: A | Discharge status: Home / Self Care | Payer: BC Managed Care – PPO | Source: Ambulatory Visit | Attending: Pulmonary Disease | Admitting: Pulmonary Disease

## 2012-07-26 ENCOUNTER — Encounter (HOSPITAL_COMMUNITY)
Admission: RE | Admit: 2012-07-26 | Discharge: 2012-07-26 | Disposition: A | Discharge status: Home / Self Care | Payer: BC Managed Care – PPO | Source: Ambulatory Visit | Attending: Pulmonary Disease | Admitting: Pulmonary Disease

## 2012-07-28 ENCOUNTER — Encounter (HOSPITAL_COMMUNITY)
Admission: RE | Admit: 2012-07-28 | Discharge: 2012-07-28 | Disposition: A | Discharge status: Home / Self Care | Payer: BC Managed Care – PPO | Source: Ambulatory Visit | Attending: Pulmonary Disease | Admitting: Pulmonary Disease

## 2012-08-01 ENCOUNTER — Ambulatory Visit (INDEPENDENT_AMBULATORY_CARE_PROVIDER_SITE_OTHER): Payer: BC Managed Care – PPO | Admitting: Adult Health

## 2012-08-01 ENCOUNTER — Encounter: Payer: Self-pay | Admitting: Adult Health

## 2012-08-01 VITALS — BP 144/68 | HR 60 | Temp 97.1°F | Ht 74.0 in | Wt 291.2 lb

## 2012-08-01 DIAGNOSIS — J441 Chronic obstructive pulmonary disease with (acute) exacerbation: Secondary | ICD-10-CM

## 2012-08-01 MED ORDER — PREDNISONE 10 MG PO TABS: ORAL_TABLET | ORAL | Status: DC

## 2012-08-01 MED ORDER — CEFDINIR 300 MG PO CAPS: 300.0000 mg | ORAL_CAPSULE | Freq: Two times a day (BID) | ORAL | Status: DC

## 2012-08-01 NOTE — Progress Notes (Signed)
  Subjective:    Patient ID: THURSTON BRENDLINGER, male    DOB: 12/16/1945, 66 y.o.   MRN: [id number]  HPI 66 yo male former smoker with known hx of COPD   08/01/2012 Acute OV  Complains of prod cough w/ clear mucus, sore throat, head congestion w/ clear drainage x2 days   Began as sore throat with drainage . Now down into chest w/ thick clear mucus.  No fever or wheezing .  No swelling , hemotpysis  No recent abx or travel .     Review of Systems Constitutional:   No  weight loss, night sweats,  Fevers, chills,  +fatigue, or  lassitude.  HEENT:   No headaches,  Difficulty swallowing,  Tooth/dental problems, or  Sore throat,                No sneezing, itching, ear ache,  +nasal congestion, post nasal drip,   CV:  No chest pain,  Orthopnea, PND, swelling in lower extremities, anasarca, dizziness, palpitations, syncope.   GI  No heartburn, indigestion, abdominal pain, nausea, vomiting, diarrhea, change in bowel habits, loss of appetite, bloody stools.   Resp:  No non-productive cough,  No coughing up of blood.  No change in color of mucus.  No wheezing.  No chest wall deformity  Skin: no rash or lesions.  GU: no dysuria, change in color of urine, no urgency or frequency.  No flank pain, no hematuria   MS:  No joint pain or swelling.  No decreased range of motion.  No back pain.  Psych:  No change in mood or affect. No depression or anxiety.  No memory loss.         Objective:   Physical Exam GEN: A/Ox3; pleasant , NAD, obese   HEENT:  [place]/AT,  EACs-clear, TMs-wnl, NOSE-clear drainage , THROAT-clear, no lesions, no postnasal drip or exudate noted.   NECK:  Supple w/ fair ROM; no JVD; normal carotid impulses w/o bruits; no thyromegaly or nodules palpated; no lymphadenopathy.  RESP  Coarse BS w/o, wheezes/ rales/ or rhonchi.no accessory muscle use, no dullness to percussion  CARD:  RRR, no m/r/g  , no peripheral edema, pulses intact, no cyanosis or clubbing.  GI:   Soft &  nt; nml bowel sounds; no organomegaly or masses detected.  Musco: Warm bil, no deformities or joint swelling noted.   Neuro: alert, no focal deficits noted.    Skin: Warm, no lesions or rashes         Assessment & Plan:

## 2012-08-01 NOTE — Patient Instructions (Addendum)
Saline nasal rinse As needed   Mucinex DM Twice daily  As needed  Cough/congestion  Prednisone taper over next week  May use Omnicef if symptoms worsen with discolored mucus.  Please contact office for sooner follow up if symptoms do not improve or worsen or seek emergency care  Follow up Dr. Shelle Iron as planned and As needed

## 2012-08-01 NOTE — Assessment & Plan Note (Signed)
Mild COPD exacerbation w/ URI   Plan  Saline nasal rinse As needed   Mucinex DM Twice daily  As needed  Cough/congestion  Prednisone taper over next week  May use Omnicef if symptoms worsen with discolored mucus.  Please contact office for sooner follow up if symptoms do not improve or worsen or seek emergency care  Follow up Dr. Shelle Iron as planned and As needed

## 2012-08-02 ENCOUNTER — Encounter (HOSPITAL_COMMUNITY): Payer: BC Managed Care – PPO

## 2012-08-04 ENCOUNTER — Encounter (HOSPITAL_COMMUNITY): Payer: BC Managed Care – PPO

## 2012-08-09 ENCOUNTER — Encounter (HOSPITAL_COMMUNITY)
Admission: RE | Admit: 2012-08-09 | Discharge: 2012-08-09 | Disposition: A | Discharge status: Home / Self Care | Payer: BC Managed Care – PPO | Source: Ambulatory Visit | Attending: Pulmonary Disease | Admitting: Pulmonary Disease

## 2012-08-09 DIAGNOSIS — J4489 Other specified chronic obstructive pulmonary disease: Secondary | ICD-10-CM | POA: Insufficient documentation

## 2012-08-09 DIAGNOSIS — Z5189 Encounter for other specified aftercare: Secondary | ICD-10-CM | POA: Insufficient documentation

## 2012-08-09 DIAGNOSIS — J449 Chronic obstructive pulmonary disease, unspecified: Secondary | ICD-10-CM | POA: Insufficient documentation

## 2012-08-09 DIAGNOSIS — G2581 Restless legs syndrome: Secondary | ICD-10-CM | POA: Insufficient documentation

## 2012-08-09 DIAGNOSIS — J984 Other disorders of lung: Secondary | ICD-10-CM | POA: Insufficient documentation

## 2012-08-09 DIAGNOSIS — Z85528 Personal history of other malignant neoplasm of kidney: Secondary | ICD-10-CM | POA: Insufficient documentation

## 2012-08-11 ENCOUNTER — Encounter (HOSPITAL_COMMUNITY)
Admission: RE | Admit: 2012-08-11 | Discharge: 2012-08-11 | Disposition: A | Discharge status: Home / Self Care | Payer: BC Managed Care – PPO | Source: Ambulatory Visit | Attending: Pulmonary Disease | Admitting: Pulmonary Disease

## 2012-08-16 ENCOUNTER — Encounter (HOSPITAL_COMMUNITY)
Admission: RE | Admit: 2012-08-16 | Discharge: 2012-08-16 | Disposition: A | Discharge status: Home / Self Care | Payer: BC Managed Care – PPO | Source: Ambulatory Visit | Attending: Pulmonary Disease | Admitting: Pulmonary Disease

## 2012-08-18 ENCOUNTER — Encounter (HOSPITAL_COMMUNITY)
Admission: RE | Admit: 2012-08-18 | Discharge: 2012-08-18 | Disposition: A | Discharge status: Home / Self Care | Payer: BC Managed Care – PPO | Source: Ambulatory Visit | Attending: Pulmonary Disease | Admitting: Pulmonary Disease

## 2012-08-23 ENCOUNTER — Encounter (HOSPITAL_COMMUNITY)
Admission: RE | Admit: 2012-08-23 | Discharge: 2012-08-23 | Disposition: A | Discharge status: Home / Self Care | Payer: BC Managed Care – PPO | Source: Ambulatory Visit | Attending: Pulmonary Disease | Admitting: Pulmonary Disease

## 2012-08-25 ENCOUNTER — Encounter (HOSPITAL_COMMUNITY)
Admission: RE | Admit: 2012-08-25 | Discharge: 2012-08-25 | Disposition: A | Discharge status: Home / Self Care | Payer: BC Managed Care – PPO | Source: Ambulatory Visit | Attending: Pulmonary Disease | Admitting: Pulmonary Disease

## 2012-08-30 ENCOUNTER — Encounter (HOSPITAL_COMMUNITY)
Admission: RE | Admit: 2012-08-30 | Discharge: 2012-08-30 | Disposition: A | Discharge status: Home / Self Care | Payer: BC Managed Care – PPO | Source: Ambulatory Visit | Attending: Pulmonary Disease | Admitting: Pulmonary Disease

## 2012-09-01 ENCOUNTER — Encounter (HOSPITAL_COMMUNITY)
Admission: RE | Admit: 2012-09-01 | Discharge: 2012-09-01 | Disposition: A | Discharge status: Home / Self Care | Payer: BC Managed Care – PPO | Source: Ambulatory Visit | Attending: Pulmonary Disease | Admitting: Pulmonary Disease

## 2012-09-06 ENCOUNTER — Encounter (HOSPITAL_COMMUNITY)
Admission: RE | Admit: 2012-09-06 | Discharge: 2012-09-06 | Disposition: A | Discharge status: Home / Self Care | Payer: BC Managed Care – PPO | Source: Ambulatory Visit | Attending: Pulmonary Disease | Admitting: Pulmonary Disease

## 2012-09-08 ENCOUNTER — Encounter (HOSPITAL_COMMUNITY)
Admission: RE | Admit: 2012-09-08 | Discharge: 2012-09-08 | Disposition: A | Discharge status: Home / Self Care | Payer: BC Managed Care – PPO | Source: Ambulatory Visit | Attending: Pulmonary Disease | Admitting: Pulmonary Disease

## 2012-09-13 ENCOUNTER — Encounter (HOSPITAL_COMMUNITY)
Admission: RE | Admit: 2012-09-13 | Discharge: 2012-09-13 | Disposition: A | Discharge status: Home / Self Care | Payer: BC Managed Care – PPO | Source: Ambulatory Visit | Attending: Pulmonary Disease | Admitting: Pulmonary Disease

## 2012-09-13 DIAGNOSIS — Z85528 Personal history of other malignant neoplasm of kidney: Secondary | ICD-10-CM | POA: Insufficient documentation

## 2012-09-13 DIAGNOSIS — J4489 Other specified chronic obstructive pulmonary disease: Secondary | ICD-10-CM | POA: Insufficient documentation

## 2012-09-13 DIAGNOSIS — J449 Chronic obstructive pulmonary disease, unspecified: Secondary | ICD-10-CM | POA: Insufficient documentation

## 2012-09-13 DIAGNOSIS — Z5189 Encounter for other specified aftercare: Secondary | ICD-10-CM | POA: Insufficient documentation

## 2012-09-13 DIAGNOSIS — J984 Other disorders of lung: Secondary | ICD-10-CM | POA: Insufficient documentation

## 2012-09-13 DIAGNOSIS — G2581 Restless legs syndrome: Secondary | ICD-10-CM | POA: Insufficient documentation

## 2012-09-15 ENCOUNTER — Encounter (HOSPITAL_COMMUNITY)
Admission: RE | Admit: 2012-09-15 | Discharge: 2012-09-15 | Disposition: A | Discharge status: Home / Self Care | Payer: BC Managed Care – PPO | Source: Ambulatory Visit | Attending: Pulmonary Disease | Admitting: Pulmonary Disease

## 2012-09-20 ENCOUNTER — Encounter (HOSPITAL_COMMUNITY): Admission: RE | Admit: 2012-09-20 | Payer: BC Managed Care – PPO | Source: Ambulatory Visit

## 2012-09-22 ENCOUNTER — Encounter (HOSPITAL_COMMUNITY): Payer: BC Managed Care – PPO

## 2012-09-27 ENCOUNTER — Encounter (HOSPITAL_COMMUNITY)
Admission: RE | Admit: 2012-09-27 | Discharge: 2012-09-27 | Disposition: A | Discharge status: Home / Self Care | Payer: BC Managed Care – PPO | Source: Ambulatory Visit | Attending: Pulmonary Disease | Admitting: Pulmonary Disease

## 2012-09-28 ENCOUNTER — Telehealth: Payer: Self-pay | Admitting: Pulmonary Disease

## 2012-09-28 NOTE — Telephone Encounter (Signed)
Letter wrote Dr Shelle Iron signed it and it was mailed out today . Also fyi for [facility] pt was put  on  Lasix 40 mg qd by  Dr Clovis Riley  Nothing further was needed.

## 2012-09-29 ENCOUNTER — Encounter (HOSPITAL_COMMUNITY)
Admission: RE | Admit: 2012-09-29 | Discharge: 2012-09-29 | Disposition: A | Discharge status: Home / Self Care | Payer: BC Managed Care – PPO | Source: Ambulatory Visit | Attending: Pulmonary Disease | Admitting: Pulmonary Disease

## 2012-10-04 ENCOUNTER — Encounter (HOSPITAL_COMMUNITY)
Admission: RE | Admit: 2012-10-04 | Discharge: 2012-10-04 | Disposition: A | Discharge status: Home / Self Care | Payer: BC Managed Care – PPO | Source: Ambulatory Visit | Attending: Pulmonary Disease | Admitting: Pulmonary Disease

## 2012-10-06 ENCOUNTER — Encounter (HOSPITAL_COMMUNITY): Payer: BC Managed Care – PPO

## 2012-10-11 ENCOUNTER — Encounter (HOSPITAL_COMMUNITY)
Admission: RE | Admit: 2012-10-11 | Discharge: 2012-10-11 | Disposition: A | Discharge status: Home / Self Care | Payer: Self-pay | Source: Ambulatory Visit | Attending: Pulmonary Disease | Admitting: Pulmonary Disease

## 2012-10-11 DIAGNOSIS — Z85528 Personal history of other malignant neoplasm of kidney: Secondary | ICD-10-CM | POA: Insufficient documentation

## 2012-10-11 DIAGNOSIS — J449 Chronic obstructive pulmonary disease, unspecified: Secondary | ICD-10-CM | POA: Insufficient documentation

## 2012-10-11 DIAGNOSIS — J984 Other disorders of lung: Secondary | ICD-10-CM | POA: Insufficient documentation

## 2012-10-11 DIAGNOSIS — Z5189 Encounter for other specified aftercare: Secondary | ICD-10-CM | POA: Insufficient documentation

## 2012-10-11 DIAGNOSIS — G2581 Restless legs syndrome: Secondary | ICD-10-CM | POA: Insufficient documentation

## 2012-10-11 DIAGNOSIS — J4489 Other specified chronic obstructive pulmonary disease: Secondary | ICD-10-CM | POA: Insufficient documentation

## 2012-10-13 ENCOUNTER — Encounter (HOSPITAL_COMMUNITY)
Admission: RE | Admit: 2012-10-13 | Discharge: 2012-10-13 | Disposition: A | Discharge status: Home / Self Care | Payer: Self-pay | Source: Ambulatory Visit | Attending: Pulmonary Disease | Admitting: Pulmonary Disease

## 2012-10-18 ENCOUNTER — Encounter (HOSPITAL_COMMUNITY)
Admission: RE | Admit: 2012-10-18 | Discharge: 2012-10-18 | Disposition: A | Discharge status: Home / Self Care | Payer: Self-pay | Source: Ambulatory Visit | Attending: Pulmonary Disease | Admitting: Pulmonary Disease

## 2012-10-20 ENCOUNTER — Encounter (HOSPITAL_COMMUNITY)
Admission: RE | Admit: 2012-10-20 | Discharge: 2012-10-20 | Disposition: A | Discharge status: Home / Self Care | Payer: Self-pay | Source: Ambulatory Visit | Attending: Pulmonary Disease | Admitting: Pulmonary Disease

## 2012-10-20 NOTE — Progress Notes (Signed)
Patient doing very well in pulmonary rehab maintenance program. Patients vitals have been stable and his pursed lip breathing has improved so much over the last year. Mr. Harold Hall is doing great with his weight training. He has increased his workloads as well as his resistance. Patient has agreed with staff that he is feeling much better since being in rehab and that he has learned a lot more attending the maintenance program as well. Very compliant with attendance and his home exercises. Great to work with.

## 2012-10-21 ENCOUNTER — Ambulatory Visit (INDEPENDENT_AMBULATORY_CARE_PROVIDER_SITE_OTHER): Payer: BC Managed Care – PPO | Admitting: Pulmonary Disease

## 2012-10-21 ENCOUNTER — Encounter: Payer: Self-pay | Admitting: Pulmonary Disease

## 2012-10-21 VITALS — BP 132/80 | HR 63 | Temp 98.3°F | Ht 74.0 in | Wt 294.6 lb

## 2012-10-21 DIAGNOSIS — J449 Chronic obstructive pulmonary disease, unspecified: Secondary | ICD-10-CM

## 2012-10-21 MED ORDER — ALBUTEROL SULFATE HFA 108 (90 BASE) MCG/ACT IN AERS: 2.0000 | INHALATION_SPRAY | Freq: Four times a day (QID) | RESPIRATORY_TRACT | Status: DC | PRN

## 2012-10-21 NOTE — Patient Instructions (Addendum)
No change in meds. Continue with pulmonary rehab Consider exertional oxygen at home followup with me in 6mos.

## 2012-10-21 NOTE — Assessment & Plan Note (Signed)
The patient is doing much better from a COPD standpoint.  He has quit smoking, and participating in pulmonary rehabilitation.  He feels he is doing well on his current regimen, and also encouraged him to work aggressively on weight loss.

## 2012-10-21 NOTE — Progress Notes (Signed)
  Subjective:    Patient ID: Harold Hall, male    DOB: December 09, 1945, 66 y.o.   MRN: [id number]  HPI Patient comes in today for followup of his known COPD.  He has continued to not smoke, and has been attending pulmonary rehabilitation.  He feels that he is doing much better.  He is staying on his bronchodilator regimen, and his last acute exacerbation was in September of this year after a bout of sinusitis.  He is using oxygen during rehabilitation, and qualifies for exertional oxygen at home, but would like to hold off until after he retires this summer upcoming.   Review of Systems  Constitutional: Negative for fever and unexpected weight change.  HENT: Negative for ear pain, nosebleeds, congestion, sore throat, rhinorrhea, sneezing, trouble swallowing, dental problem, postnasal drip and sinus pressure.   Eyes: Negative for redness and itching.  Respiratory: Negative for cough, chest tightness, shortness of breath and wheezing.   Cardiovascular: Positive for leg swelling. Negative for palpitations.  Gastrointestinal: Negative for nausea and vomiting.  Genitourinary: Negative for dysuria.  Musculoskeletal: Positive for arthralgias. Negative for joint swelling.  Skin: Negative for rash.  Neurological: Positive for headaches.  Hematological: Does not bruise/bleed easily.  Psychiatric/Behavioral: Negative for dysphoric mood. The patient is not nervous/anxious.        Objective:   Physical Exam Overweight male in no acute distress Nose without purulence or discharge noted Neck without lymphadenopathy or thyromegaly Chest with decreased breath sounds, no wheezing Cardiac exam was regular rate and rhythm Lower extremities without edema, no cyanosis Alert and oriented, moves all 4 extremities.       Assessment & Plan:

## 2012-10-25 ENCOUNTER — Encounter (HOSPITAL_COMMUNITY)
Admission: RE | Admit: 2012-10-25 | Discharge: 2012-10-25 | Disposition: A | Discharge status: Home / Self Care | Payer: Self-pay | Source: Ambulatory Visit | Attending: Pulmonary Disease | Admitting: Pulmonary Disease

## 2012-10-27 ENCOUNTER — Encounter (HOSPITAL_COMMUNITY)
Admission: RE | Admit: 2012-10-27 | Discharge: 2012-10-27 | Disposition: A | Discharge status: Home / Self Care | Payer: Self-pay | Source: Ambulatory Visit | Attending: Pulmonary Disease | Admitting: Pulmonary Disease

## 2012-11-01 ENCOUNTER — Encounter (HOSPITAL_COMMUNITY): Payer: Self-pay

## 2012-11-03 ENCOUNTER — Encounter (HOSPITAL_COMMUNITY): Payer: Self-pay

## 2012-11-08 ENCOUNTER — Encounter (HOSPITAL_COMMUNITY)
Admission: RE | Admit: 2012-11-08 | Discharge: 2012-11-08 | Disposition: A | Discharge status: Home / Self Care | Payer: Self-pay | Source: Ambulatory Visit | Attending: Pulmonary Disease | Admitting: Pulmonary Disease

## 2012-11-10 ENCOUNTER — Encounter (HOSPITAL_COMMUNITY)
Admission: RE | Admit: 2012-11-10 | Discharge: 2012-11-10 | Disposition: A | Discharge status: Home / Self Care | Payer: Self-pay | Source: Ambulatory Visit | Attending: Pulmonary Disease | Admitting: Pulmonary Disease

## 2012-11-10 DIAGNOSIS — Z85528 Personal history of other malignant neoplasm of kidney: Secondary | ICD-10-CM | POA: Insufficient documentation

## 2012-11-10 DIAGNOSIS — G2581 Restless legs syndrome: Secondary | ICD-10-CM | POA: Insufficient documentation

## 2012-11-10 DIAGNOSIS — Z5189 Encounter for other specified aftercare: Secondary | ICD-10-CM | POA: Insufficient documentation

## 2012-11-10 DIAGNOSIS — J984 Other disorders of lung: Secondary | ICD-10-CM | POA: Insufficient documentation

## 2012-11-10 DIAGNOSIS — J4489 Other specified chronic obstructive pulmonary disease: Secondary | ICD-10-CM | POA: Insufficient documentation

## 2012-11-10 DIAGNOSIS — J449 Chronic obstructive pulmonary disease, unspecified: Secondary | ICD-10-CM | POA: Insufficient documentation

## 2012-11-15 ENCOUNTER — Encounter (HOSPITAL_COMMUNITY)
Admission: RE | Admit: 2012-11-15 | Discharge: 2012-11-15 | Disposition: A | Discharge status: Home / Self Care | Payer: Self-pay | Source: Ambulatory Visit | Attending: Pulmonary Disease | Admitting: Pulmonary Disease

## 2012-11-17 ENCOUNTER — Encounter (HOSPITAL_COMMUNITY)
Admission: RE | Admit: 2012-11-17 | Discharge: 2012-11-17 | Disposition: A | Discharge status: Home / Self Care | Payer: Self-pay | Source: Ambulatory Visit | Attending: Pulmonary Disease | Admitting: Pulmonary Disease

## 2012-11-22 ENCOUNTER — Encounter (HOSPITAL_COMMUNITY)
Admission: RE | Admit: 2012-11-22 | Discharge: 2012-11-22 | Disposition: A | Discharge status: Home / Self Care | Payer: Self-pay | Source: Ambulatory Visit | Attending: Pulmonary Disease | Admitting: Pulmonary Disease

## 2012-11-22 NOTE — Progress Notes (Signed)
Mr. Harold Hall completed a 6 minute walk test in the pulmonary maintenance rehab program on 11/15/2012. Patient wanted his walk test on 3L NCC this time and did ok with it. Oxygen sats maintained at 90% the whole walk. Patient rated the walk test as an RPE of 11 and Dyspnea 1. Pt. Ambulated 1000 ft which was less than the undergrad completion walk test. Patient was advised to work more on his pursed lip breathing and to start eating a healthier diet. Patients weight increased from 131.8kg to 134.2kg since April 2013. Pt voices understanding and knows he needs to step it up at home not just here in rehab. Patient is a great patient to work with and is always holding a positive attitude just wanting to make sure that even though he knows his dx will decline in time he needs to maintain what he has worked so hard for. Mr. Harold Hall is still doing his resistance training and on 3L NCC with exercise. Spoke with patient and advised maybe going to 4L on certain exercises i.e walking, weight training, stationary bike since his oxygen sats do drop below 90%. However, pt does have a quick recovery time. Keep up the good work and the pulmonary rehab staff will continue to support and encourage.

## 2012-11-24 ENCOUNTER — Encounter (HOSPITAL_COMMUNITY)
Admission: RE | Admit: 2012-11-24 | Discharge: 2012-11-24 | Disposition: A | Discharge status: Home / Self Care | Payer: Self-pay | Source: Ambulatory Visit | Attending: Pulmonary Disease | Admitting: Pulmonary Disease

## 2012-11-29 ENCOUNTER — Encounter (HOSPITAL_COMMUNITY)
Admission: RE | Admit: 2012-11-29 | Discharge: 2012-11-29 | Disposition: A | Discharge status: Home / Self Care | Payer: Self-pay | Source: Ambulatory Visit | Attending: Pulmonary Disease | Admitting: Pulmonary Disease

## 2012-12-01 ENCOUNTER — Encounter (HOSPITAL_COMMUNITY)
Admission: RE | Admit: 2012-12-01 | Discharge: 2012-12-01 | Disposition: A | Discharge status: Home / Self Care | Payer: Self-pay | Source: Ambulatory Visit | Attending: Pulmonary Disease | Admitting: Pulmonary Disease

## 2012-12-06 ENCOUNTER — Encounter (HOSPITAL_COMMUNITY): Payer: Self-pay

## 2012-12-08 ENCOUNTER — Encounter (HOSPITAL_COMMUNITY): Payer: Self-pay

## 2012-12-13 ENCOUNTER — Encounter (HOSPITAL_COMMUNITY)
Admission: RE | Admit: 2012-12-13 | Discharge: 2012-12-13 | Disposition: A | Discharge status: Home / Self Care | Payer: Self-pay | Source: Ambulatory Visit | Attending: Pulmonary Disease | Admitting: Pulmonary Disease

## 2012-12-13 DIAGNOSIS — G2581 Restless legs syndrome: Secondary | ICD-10-CM | POA: Insufficient documentation

## 2012-12-13 DIAGNOSIS — J4489 Other specified chronic obstructive pulmonary disease: Secondary | ICD-10-CM | POA: Insufficient documentation

## 2012-12-13 DIAGNOSIS — Z85528 Personal history of other malignant neoplasm of kidney: Secondary | ICD-10-CM | POA: Insufficient documentation

## 2012-12-13 DIAGNOSIS — J984 Other disorders of lung: Secondary | ICD-10-CM | POA: Insufficient documentation

## 2012-12-13 DIAGNOSIS — J449 Chronic obstructive pulmonary disease, unspecified: Secondary | ICD-10-CM | POA: Insufficient documentation

## 2012-12-13 DIAGNOSIS — Z5189 Encounter for other specified aftercare: Secondary | ICD-10-CM | POA: Insufficient documentation

## 2012-12-15 ENCOUNTER — Encounter (HOSPITAL_COMMUNITY)
Admission: RE | Admit: 2012-12-15 | Discharge: 2012-12-15 | Disposition: A | Discharge status: Home / Self Care | Payer: Self-pay | Source: Ambulatory Visit | Attending: Pulmonary Disease | Admitting: Pulmonary Disease

## 2012-12-20 ENCOUNTER — Encounter: Payer: Self-pay | Admitting: Pulmonary Disease

## 2012-12-20 ENCOUNTER — Ambulatory Visit (INDEPENDENT_AMBULATORY_CARE_PROVIDER_SITE_OTHER): Payer: BC Managed Care – PPO | Admitting: Pulmonary Disease

## 2012-12-20 ENCOUNTER — Encounter (HOSPITAL_COMMUNITY): Payer: Self-pay

## 2012-12-20 VITALS — BP 180/82 | HR 91 | Temp 97.9°F | Ht 74.0 in | Wt 298.4 lb

## 2012-12-20 DIAGNOSIS — J449 Chronic obstructive pulmonary disease, unspecified: Secondary | ICD-10-CM

## 2012-12-20 DIAGNOSIS — J209 Acute bronchitis, unspecified: Secondary | ICD-10-CM

## 2012-12-20 MED ORDER — LEVOFLOXACIN 750 MG PO TABS: 750.0000 mg | ORAL_TABLET | Freq: Every day | ORAL | Status: DC

## 2012-12-20 NOTE — Progress Notes (Signed)
  Subjective:    Patient ID: Harold Hall, male    DOB: 25-Dec-1945, 67 y.o.   MRN: [id number]  HPI The patient comes in today for an acute sick visit.  He has known significant emphysema, and gives a four-day history of increasing chest congestion with cough productive of purulent mucus.  He has had a low grade fever to 100.5-101, and feels that he is a little more short of breath than usual.  He has not had any purulent mucus from his nose, and denies dysuria.   Review of Systems  Constitutional: Negative for fever and unexpected weight change.  HENT: Negative for ear pain, nosebleeds, congestion, sore throat, rhinorrhea, sneezing, trouble swallowing, dental problem, postnasal drip and sinus pressure.   Eyes: Negative for redness and itching.  Respiratory: Positive for cough and shortness of breath. Negative for chest tightness and wheezing.   Cardiovascular: Negative for palpitations and leg swelling.  Gastrointestinal: Negative for nausea and vomiting.  Genitourinary: Negative for dysuria.  Musculoskeletal: Negative for joint swelling.  Skin: Negative for rash.  Neurological: Negative for headaches.  Hematological: Does not bruise/bleed easily.  Psychiatric/Behavioral: Negative for dysphoric mood. The patient is not nervous/anxious.        Objective:   Physical Exam Obese male in no acute distress Nose without purulent discharge noted Neck without lymphadenopathy or thyromegaly Chest with decreased breath sounds, no crackles or wheezing Cardiac exam regular rate and rhythm Lower extremities without edema, cyanosis Alert and oriented, moves all 4 extremities.       Assessment & Plan:

## 2012-12-20 NOTE — Assessment & Plan Note (Signed)
The patient has some increased shortness of breath, but his history does not suggest at this time a COPD exacerbation.  We'll therefore hold off on a prednisone taper, but we'll have a low threshold for this if something changes.

## 2012-12-20 NOTE — Assessment & Plan Note (Signed)
The patient gives a history that is most consistent with acute bronchitis.  He has very little pulmonary reserve, and we'll therefore treat him with a course of antibiotics.  He is to let us know if he does not return to baseline.

## 2012-12-20 NOTE — Progress Notes (Signed)
Fax was sent to [facility] to cancel E-Prescribed medication--Levaquin 750mg  1 po qd x 7 days. I called [facility] and they refused to cancel over the phone, stated that I needed to resend the Rx with a note attached in SIG stating to CANCEL Rx....[facility] would not allow me to send duplicate Rx. Seward Meth, CMA  12/20/12

## 2012-12-20 NOTE — Patient Instructions (Addendum)
Will treat with levaquin 750mg  one a day for 7 days. If you have worsening of your breathing, please call so that we can send in a prednisone taper for you. Will refer you to a medical equipment company to look at various forms of portable oxygen. Keep followup with me, but call if not doing well.

## 2012-12-22 ENCOUNTER — Encounter (HOSPITAL_COMMUNITY): Payer: Self-pay

## 2012-12-26 ENCOUNTER — Telehealth: Payer: Self-pay | Admitting: Pulmonary Disease

## 2012-12-26 MED ORDER — PREDNISONE 10 MG PO TABS: ORAL_TABLET | ORAL | Status: DC

## 2012-12-26 NOTE — Telephone Encounter (Signed)
Called and spoke with pt and notified of recs per [facility] Pt verbalized understanding and denied any questions Rx was sent to pharm

## 2012-12-26 NOTE — Telephone Encounter (Signed)
The pt says he finished the Levaquin this morning. His cough has improved, mucus is gray in color, and nasal congestion is mostly clear but blood streaked at times. He says his breathing has not changed and still seems labored. He would like to have a Prednisone taper called in if [facility] agrees. KC, pls advise.No Known Allergies

## 2012-12-26 NOTE — Telephone Encounter (Signed)
Ok to call in prednisone 10mg  4 qd for 2 days, then 3 qd for 2 days, then 2 qd for 2 days, the one qd for 2 days, then stop.  Can use saline rinses for persistent nasal discharge/bloody discharge.

## 2012-12-27 ENCOUNTER — Encounter (HOSPITAL_COMMUNITY)
Admission: RE | Admit: 2012-12-27 | Discharge: 2012-12-27 | Disposition: A | Discharge status: Home / Self Care | Payer: Self-pay | Source: Ambulatory Visit | Attending: Pulmonary Disease | Admitting: Pulmonary Disease

## 2012-12-29 ENCOUNTER — Encounter (HOSPITAL_COMMUNITY)
Admission: RE | Admit: 2012-12-29 | Discharge: 2012-12-29 | Disposition: A | Discharge status: Home / Self Care | Payer: Self-pay | Source: Ambulatory Visit | Attending: Pulmonary Disease | Admitting: Pulmonary Disease

## 2012-12-29 ENCOUNTER — Telehealth: Payer: Self-pay | Admitting: Pulmonary Disease

## 2012-12-29 NOTE — Progress Notes (Signed)
Harold Hall's entry blood pressure was 162/60 repeat blood pressure 172/70.  Harold Hall said he recently finished taking Levaquin for a recent respiratory infection and is currently taking a prednisone dose pack.  Harold Hall admitted to taking sudafed last night for nasal congestion.  Harold Hall said he has cut back on taking his furosemide to every other day per Dr Shelle Iron for the past 2 weeks.  Advised that [name] not exercise today due to elevated blood pressure.  Dr Teddy Spike office called and notified of today's blood pressure elevations spoke with Mindy.  Exit blood pressure 142/70 Heart rate 67.  Sa02 95%. Harold Hall plans to return to exercise on Tuesday.  Harold Hall was instructed to call Dr Teddy Spike office when he gets home. Will fax exercise flow sheets to Dr. Teddy Spike office for review.

## 2012-12-29 NOTE — Telephone Encounter (Signed)
Called and spoke with pt He states that he is doing fine as far as respiratory symptoms The problem he is having is with BP He checked it again at home and it is still elevated 166/90 I advised no more sudafed unless PCP says it's ok, avoid salt, monitor BP and make appt with PCP Pt verbalized understanding

## 2012-12-29 NOTE — Telephone Encounter (Signed)
I spoke with Byrd Hesselbach. She stated pt went to pulm rehab and she was checking his BP and it was 172/70. Pt had advised her he had recently finished levaquin for resp issues and is currently on prednisone. He took sudafed last night bc he is feeling stopped up. Per Byrd Hesselbach she knows this can cause BP to run high. Pt is going to head home and will give [place] a call as soon as he gets there. Will hold this in triage so we call f/u on and give pt a call to get more symptoms from him.

## 2013-01-03 ENCOUNTER — Encounter (HOSPITAL_COMMUNITY): Payer: Self-pay

## 2013-01-05 ENCOUNTER — Encounter (HOSPITAL_COMMUNITY)
Admission: RE | Admit: 2013-01-05 | Discharge: 2013-01-05 | Disposition: A | Discharge status: Home / Self Care | Payer: Self-pay | Source: Ambulatory Visit | Attending: Pulmonary Disease | Admitting: Pulmonary Disease

## 2013-01-08 ENCOUNTER — Other Ambulatory Visit: Payer: Self-pay | Admitting: Pulmonary Disease

## 2013-01-10 ENCOUNTER — Telehealth: Payer: Self-pay | Admitting: *Deleted

## 2013-01-10 ENCOUNTER — Encounter (HOSPITAL_COMMUNITY): Payer: Self-pay

## 2013-01-10 DIAGNOSIS — J4489 Other specified chronic obstructive pulmonary disease: Secondary | ICD-10-CM | POA: Insufficient documentation

## 2013-01-10 DIAGNOSIS — G2581 Restless legs syndrome: Secondary | ICD-10-CM | POA: Insufficient documentation

## 2013-01-10 DIAGNOSIS — Z85528 Personal history of other malignant neoplasm of kidney: Secondary | ICD-10-CM | POA: Insufficient documentation

## 2013-01-10 DIAGNOSIS — J984 Other disorders of lung: Secondary | ICD-10-CM | POA: Insufficient documentation

## 2013-01-10 DIAGNOSIS — Z5189 Encounter for other specified aftercare: Secondary | ICD-10-CM | POA: Insufficient documentation

## 2013-01-10 NOTE — Telephone Encounter (Signed)
done

## 2013-01-10 NOTE — Telephone Encounter (Signed)
Letter from [facility] has been placed in your green folder for your review and signature. Please advise. Thanks.

## 2013-01-12 ENCOUNTER — Encounter (HOSPITAL_COMMUNITY)
Admission: RE | Admit: 2013-01-12 | Discharge: 2013-01-12 | Disposition: A | Discharge status: Home / Self Care | Payer: Self-pay | Source: Ambulatory Visit | Attending: Pulmonary Disease | Admitting: Pulmonary Disease

## 2013-01-17 ENCOUNTER — Encounter (HOSPITAL_COMMUNITY)
Admission: RE | Admit: 2013-01-17 | Discharge: 2013-01-17 | Disposition: A | Discharge status: Home / Self Care | Payer: Self-pay | Source: Ambulatory Visit | Attending: Pulmonary Disease | Admitting: Pulmonary Disease

## 2013-01-19 ENCOUNTER — Encounter (HOSPITAL_COMMUNITY)
Admission: RE | Admit: 2013-01-19 | Discharge: 2013-01-19 | Disposition: A | Discharge status: Home / Self Care | Payer: Self-pay | Source: Ambulatory Visit | Attending: Pulmonary Disease | Admitting: Pulmonary Disease

## 2013-01-24 ENCOUNTER — Encounter (HOSPITAL_COMMUNITY): Payer: Self-pay

## 2013-01-26 ENCOUNTER — Encounter (HOSPITAL_COMMUNITY)
Admission: RE | Admit: 2013-01-26 | Discharge: 2013-01-26 | Disposition: A | Discharge status: Home / Self Care | Payer: Self-pay | Source: Ambulatory Visit | Attending: Pulmonary Disease | Admitting: Pulmonary Disease

## 2013-01-30 ENCOUNTER — Other Ambulatory Visit: Payer: Self-pay | Admitting: Pulmonary Disease

## 2013-01-31 ENCOUNTER — Encounter (HOSPITAL_COMMUNITY)
Admission: RE | Admit: 2013-01-31 | Discharge: 2013-01-31 | Disposition: A | Discharge status: Home / Self Care | Payer: Self-pay | Source: Ambulatory Visit | Attending: Pulmonary Disease | Admitting: Pulmonary Disease

## 2013-01-31 ENCOUNTER — Telehealth: Payer: Self-pay | Admitting: Pulmonary Disease

## 2013-01-31 MED ORDER — ROPINIROLE HCL 0.5 MG PO TABS: 0.5000 mg | ORAL_TABLET | Freq: Every day | ORAL | Status: DC

## 2013-01-31 NOTE — Telephone Encounter (Signed)
Patient requesting refill on ropinirole 0.5mg  called into [address] Patient states this is usually filled by [facility] but he ran out and needs some now. Requesting 2 week supply. Rx last filled 07/17/12-- ropinirole 0.5 mg #90 with 1refill Dr. Shelle Iron is this ok to fill?   Last OV: 12/20/12 Next OV: 04/21/13  Patient states we can leave detailed VM on home phone [phone])

## 2013-01-31 NOTE — Telephone Encounter (Signed)
Ok to get him 2 weeks worth until he can get shipment [facility].  Can have 3 weeks if he thinks he needs.

## 2013-01-31 NOTE — Telephone Encounter (Signed)
Pt returned call & can be reached at [phone] until 2 PM today.  Antionette Fairy

## 2013-01-31 NOTE — Telephone Encounter (Signed)
lmomtcb x1 for pt 

## 2013-01-31 NOTE — Telephone Encounter (Signed)
ATC aptient to make him aware. LMOM to make patient aware rx has been sent in to requested pharmacy. Nothing further at this time

## 2013-02-02 ENCOUNTER — Encounter (HOSPITAL_COMMUNITY)
Admission: RE | Admit: 2013-02-02 | Discharge: 2013-02-02 | Disposition: A | Discharge status: Home / Self Care | Payer: Self-pay | Source: Ambulatory Visit | Attending: Pulmonary Disease | Admitting: Pulmonary Disease

## 2013-02-07 ENCOUNTER — Encounter (HOSPITAL_COMMUNITY)
Admission: RE | Admit: 2013-02-07 | Discharge: 2013-02-07 | Disposition: A | Discharge status: Home / Self Care | Payer: Self-pay | Source: Ambulatory Visit | Attending: Pulmonary Disease | Admitting: Pulmonary Disease

## 2013-02-07 DIAGNOSIS — Z5189 Encounter for other specified aftercare: Secondary | ICD-10-CM | POA: Insufficient documentation

## 2013-02-07 DIAGNOSIS — G2581 Restless legs syndrome: Secondary | ICD-10-CM | POA: Insufficient documentation

## 2013-02-07 DIAGNOSIS — Z85528 Personal history of other malignant neoplasm of kidney: Secondary | ICD-10-CM | POA: Insufficient documentation

## 2013-02-07 DIAGNOSIS — J449 Chronic obstructive pulmonary disease, unspecified: Secondary | ICD-10-CM | POA: Insufficient documentation

## 2013-02-07 DIAGNOSIS — J984 Other disorders of lung: Secondary | ICD-10-CM | POA: Insufficient documentation

## 2013-02-07 DIAGNOSIS — J4489 Other specified chronic obstructive pulmonary disease: Secondary | ICD-10-CM | POA: Insufficient documentation

## 2013-02-09 ENCOUNTER — Encounter (HOSPITAL_COMMUNITY)
Admission: RE | Admit: 2013-02-09 | Discharge: 2013-02-09 | Disposition: A | Discharge status: Home / Self Care | Payer: Self-pay | Source: Ambulatory Visit | Attending: Pulmonary Disease | Admitting: Pulmonary Disease

## 2013-02-09 ENCOUNTER — Encounter (HOSPITAL_COMMUNITY): Payer: Self-pay

## 2013-02-14 ENCOUNTER — Encounter (HOSPITAL_COMMUNITY)
Admission: RE | Admit: 2013-02-14 | Discharge: 2013-02-14 | Disposition: A | Discharge status: Home / Self Care | Payer: Self-pay | Source: Ambulatory Visit | Attending: Pulmonary Disease | Admitting: Pulmonary Disease

## 2013-02-14 ENCOUNTER — Encounter (HOSPITAL_COMMUNITY): Payer: Self-pay

## 2013-02-15 ENCOUNTER — Telehealth: Payer: Self-pay | Admitting: Pulmonary Disease

## 2013-02-15 NOTE — Telephone Encounter (Signed)
Barbaraann Share, MD at 01/31/2013 4:31 PM   Status: Signed            Ok to get him 2 weeks worth until he can get [facility]. Can have 3 weeks if he thinks he needs.         Lazarus Salines, RN at 01/31/2013 2:52 PM    Status: Signed             Patient requesting refill on ropinirole 0.5mg  called into [address]  Patient states this is usually filled by [facility] but he ran out and needs some now.  Requesting 2 week supply.  Rx last filled 07/17/12-- ropinirole 0.5 mg #90 with 1refill  Dr. Shelle Iron is this ok to fill?  Last OV: 12/20/12  Next OV: 04/21/13  Patient states we can leave detailed VM on home phone [phone])   ---  I spoke with pt. He stated the refills had ran out and he does not have any refills at [facility]. He has one week left of the rx from [facility]. Is it okay to send 90 day supply of requip to [facility] KC thanks

## 2013-02-16 ENCOUNTER — Encounter (HOSPITAL_COMMUNITY): Payer: Self-pay

## 2013-02-16 ENCOUNTER — Encounter (HOSPITAL_COMMUNITY)
Admission: RE | Admit: 2013-02-16 | Discharge: 2013-02-16 | Disposition: A | Discharge status: Home / Self Care | Payer: Self-pay | Source: Ambulatory Visit | Attending: Pulmonary Disease | Admitting: Pulmonary Disease

## 2013-02-21 ENCOUNTER — Encounter (HOSPITAL_COMMUNITY)
Admission: RE | Admit: 2013-02-21 | Discharge: 2013-02-21 | Disposition: A | Discharge status: Home / Self Care | Payer: Self-pay | Source: Ambulatory Visit | Attending: Pulmonary Disease | Admitting: Pulmonary Disease

## 2013-02-21 ENCOUNTER — Other Ambulatory Visit: Payer: Self-pay | Admitting: Pulmonary Disease

## 2013-02-21 ENCOUNTER — Encounter (HOSPITAL_COMMUNITY): Payer: Self-pay

## 2013-02-22 ENCOUNTER — Telehealth: Payer: Self-pay | Admitting: Pulmonary Disease

## 2013-02-22 MED ORDER — ROPINIROLE HCL 0.5 MG PO TABS: 0.5000 mg | ORAL_TABLET | Freq: Every day | ORAL | Status: DC

## 2013-02-22 NOTE — Telephone Encounter (Signed)
ATC patient to inform him rx has been sent.

## 2013-02-22 NOTE — Telephone Encounter (Signed)
Yes

## 2013-02-22 NOTE — Telephone Encounter (Signed)
Phone note closed from 02/15/13 when pt called for RX.  I spoke with pt. He stated the refills had ran out and he does not have any refills [facility]. He has one week left of the rx from [facility]. Is it okay to send 90 day supply of requip to [facility] KC thanks -----  [facility] please advise thanks

## 2013-02-23 ENCOUNTER — Encounter (HOSPITAL_COMMUNITY): Payer: Self-pay

## 2013-02-23 ENCOUNTER — Encounter (HOSPITAL_COMMUNITY)
Admission: RE | Admit: 2013-02-23 | Discharge: 2013-02-23 | Disposition: A | Discharge status: Home / Self Care | Payer: Self-pay | Source: Ambulatory Visit | Attending: Pulmonary Disease | Admitting: Pulmonary Disease

## 2013-02-23 NOTE — Telephone Encounter (Signed)
lmtcb x1 for pt. 

## 2013-02-23 NOTE — Telephone Encounter (Signed)
Aware rx has been sent.Harold Hall

## 2013-02-27 ENCOUNTER — Encounter: Payer: Self-pay | Admitting: *Deleted

## 2013-02-28 ENCOUNTER — Encounter (HOSPITAL_COMMUNITY): Payer: Self-pay

## 2013-03-02 ENCOUNTER — Encounter (HOSPITAL_COMMUNITY): Payer: Self-pay

## 2013-03-02 ENCOUNTER — Encounter (HOSPITAL_COMMUNITY)
Admission: RE | Admit: 2013-03-02 | Discharge: 2013-03-02 | Disposition: A | Discharge status: Home / Self Care | Payer: Self-pay | Source: Ambulatory Visit | Attending: Pulmonary Disease | Admitting: Pulmonary Disease

## 2013-03-07 ENCOUNTER — Encounter (HOSPITAL_COMMUNITY): Payer: Self-pay

## 2013-03-07 ENCOUNTER — Encounter (HOSPITAL_COMMUNITY)
Admission: RE | Admit: 2013-03-07 | Discharge: 2013-03-07 | Disposition: A | Discharge status: Home / Self Care | Payer: Self-pay | Source: Ambulatory Visit | Attending: Pulmonary Disease | Admitting: Pulmonary Disease

## 2013-03-09 ENCOUNTER — Encounter (HOSPITAL_COMMUNITY)
Admission: RE | Admit: 2013-03-09 | Discharge: 2013-03-09 | Disposition: A | Discharge status: Home / Self Care | Payer: Self-pay | Source: Ambulatory Visit | Attending: Pulmonary Disease | Admitting: Pulmonary Disease

## 2013-03-09 ENCOUNTER — Encounter (HOSPITAL_COMMUNITY): Payer: Self-pay

## 2013-03-09 DIAGNOSIS — J984 Other disorders of lung: Secondary | ICD-10-CM | POA: Insufficient documentation

## 2013-03-09 DIAGNOSIS — J4489 Other specified chronic obstructive pulmonary disease: Secondary | ICD-10-CM | POA: Insufficient documentation

## 2013-03-09 DIAGNOSIS — Z85528 Personal history of other malignant neoplasm of kidney: Secondary | ICD-10-CM | POA: Insufficient documentation

## 2013-03-09 DIAGNOSIS — G2581 Restless legs syndrome: Secondary | ICD-10-CM | POA: Insufficient documentation

## 2013-03-09 DIAGNOSIS — J449 Chronic obstructive pulmonary disease, unspecified: Secondary | ICD-10-CM | POA: Insufficient documentation

## 2013-03-09 DIAGNOSIS — Z5189 Encounter for other specified aftercare: Secondary | ICD-10-CM | POA: Insufficient documentation

## 2013-03-14 ENCOUNTER — Encounter (HOSPITAL_COMMUNITY)
Admission: RE | Admit: 2013-03-14 | Discharge: 2013-03-14 | Disposition: A | Discharge status: Home / Self Care | Payer: Self-pay | Source: Ambulatory Visit | Attending: Pulmonary Disease | Admitting: Pulmonary Disease

## 2013-03-14 ENCOUNTER — Encounter (HOSPITAL_COMMUNITY): Payer: Self-pay

## 2013-03-16 ENCOUNTER — Encounter (HOSPITAL_COMMUNITY)
Admission: RE | Admit: 2013-03-16 | Discharge: 2013-03-16 | Disposition: A | Discharge status: Home / Self Care | Payer: Self-pay | Source: Ambulatory Visit | Attending: Pulmonary Disease | Admitting: Pulmonary Disease

## 2013-03-16 ENCOUNTER — Encounter (HOSPITAL_COMMUNITY): Payer: Self-pay

## 2013-03-21 ENCOUNTER — Encounter (HOSPITAL_COMMUNITY)
Admission: RE | Admit: 2013-03-21 | Discharge: 2013-03-21 | Disposition: A | Discharge status: Home / Self Care | Payer: Self-pay | Source: Ambulatory Visit | Attending: Pulmonary Disease | Admitting: Pulmonary Disease

## 2013-03-21 ENCOUNTER — Encounter (HOSPITAL_COMMUNITY): Payer: Self-pay

## 2013-03-22 ENCOUNTER — Telehealth: Payer: Self-pay | Admitting: Pulmonary Disease

## 2013-03-22 NOTE — Telephone Encounter (Signed)
Called and left msg for [employer].  Not sure what he is asking, a CMN was signed 03/07/13 by Dr Shelle Iron for pt 02, usually after cmn is signed by doctor, it is up to DME to deliver. Left msg for Tawanna Cooler to call me back

## 2013-03-22 NOTE — Telephone Encounter (Signed)
Todd from [employer] returned call, says pt wanted to buy out his concentrator and he faxed over a small form yesterday 5/13/14needing Dr Shelle Iron signature wanted to know was this received. I do not have and asked him to refax to [facility] fax # [phone] and i will present to Dr Shelle Iron .Kandice Hams

## 2013-03-22 NOTE — Telephone Encounter (Signed)
Patient calling about the same, but also is asking to speak to the nurse.

## 2013-03-22 NOTE — Telephone Encounter (Signed)
Spoke with Bevelyn Ngo is looking through her forms to give update on status. Will forward to her to document.

## 2013-03-23 ENCOUNTER — Encounter (HOSPITAL_COMMUNITY): Payer: Self-pay

## 2013-03-23 ENCOUNTER — Encounter (HOSPITAL_COMMUNITY)
Admission: RE | Admit: 2013-03-23 | Discharge: 2013-03-23 | Disposition: A | Discharge status: Home / Self Care | Payer: Self-pay | Source: Ambulatory Visit | Attending: Pulmonary Disease | Admitting: Pulmonary Disease

## 2013-03-23 NOTE — Telephone Encounter (Signed)
This form is on [employer] .Kandice Hams

## 2013-03-24 NOTE — Telephone Encounter (Signed)
This has been given to [facility] to fill out. This will be returned to you once signed.

## 2013-03-26 ENCOUNTER — Emergency Department (HOSPITAL_COMMUNITY)
Admission: EM | Admit: 2013-03-26 | Discharge: 2013-03-26 | Disposition: A | Discharge status: Home / Self Care | Payer: BC Managed Care – PPO | Attending: Emergency Medicine | Admitting: Emergency Medicine

## 2013-03-26 ENCOUNTER — Encounter (HOSPITAL_COMMUNITY): Payer: Self-pay | Admitting: Emergency Medicine

## 2013-03-26 DIAGNOSIS — Z87448 Personal history of other diseases of urinary system: Secondary | ICD-10-CM | POA: Insufficient documentation

## 2013-03-26 DIAGNOSIS — Z87891 Personal history of nicotine dependence: Secondary | ICD-10-CM | POA: Insufficient documentation

## 2013-03-26 DIAGNOSIS — I10 Essential (primary) hypertension: Secondary | ICD-10-CM | POA: Insufficient documentation

## 2013-03-26 DIAGNOSIS — J4489 Other specified chronic obstructive pulmonary disease: Secondary | ICD-10-CM | POA: Insufficient documentation

## 2013-03-26 DIAGNOSIS — Z8719 Personal history of other diseases of the digestive system: Secondary | ICD-10-CM | POA: Insufficient documentation

## 2013-03-26 DIAGNOSIS — H5710 Ocular pain, unspecified eye: Secondary | ICD-10-CM | POA: Insufficient documentation

## 2013-03-26 DIAGNOSIS — Z79899 Other long term (current) drug therapy: Secondary | ICD-10-CM | POA: Insufficient documentation

## 2013-03-26 DIAGNOSIS — Z85528 Personal history of other malignant neoplasm of kidney: Secondary | ICD-10-CM | POA: Insufficient documentation

## 2013-03-26 DIAGNOSIS — Z8739 Personal history of other diseases of the musculoskeletal system and connective tissue: Secondary | ICD-10-CM | POA: Insufficient documentation

## 2013-03-26 DIAGNOSIS — H5712 Ocular pain, left eye: Secondary | ICD-10-CM

## 2013-03-26 DIAGNOSIS — M109 Gout, unspecified: Secondary | ICD-10-CM | POA: Insufficient documentation

## 2013-03-26 DIAGNOSIS — Z8709 Personal history of other diseases of the respiratory system: Secondary | ICD-10-CM | POA: Insufficient documentation

## 2013-03-26 DIAGNOSIS — E7800 Pure hypercholesterolemia, unspecified: Secondary | ICD-10-CM | POA: Insufficient documentation

## 2013-03-26 DIAGNOSIS — J449 Chronic obstructive pulmonary disease, unspecified: Secondary | ICD-10-CM | POA: Insufficient documentation

## 2013-03-26 DIAGNOSIS — H5789 Other specified disorders of eye and adnexa: Secondary | ICD-10-CM | POA: Insufficient documentation

## 2013-03-26 MED ORDER — TETRACAINE HCL 0.5 % OP SOLN
2.0000 [drp] | Freq: Once | OPHTHALMIC | Status: AC
  Administered 2013-03-26: 2 [drp] via OPHTHALMIC
  Filled 2013-03-26: qty 2

## 2013-03-26 MED ORDER — TOBRAMYCIN-DEXAMETHASONE 0.3-0.1 % OP SUSP
2.0000 [drp] | OPHTHALMIC | Status: DC
  Administered 2013-03-26: 2 [drp] via OPHTHALMIC
  Filled 2013-03-26 (×2): qty 2.5

## 2013-03-26 MED ORDER — FLUORESCEIN SODIUM 1 MG OP STRP
1.0000 | ORAL_STRIP | Freq: Once | OPHTHALMIC | Status: AC
  Administered 2013-03-26: 1 via OPHTHALMIC

## 2013-03-26 NOTE — ED Provider Notes (Signed)
History    This chart was scribed for Jaynie Crumble, non-physician practitioner working with Carleene Cooper III, MD by Leone Payor, ED Scribe. This patient was seen in room TR05C/TR05C and the patient's care was started at 2006.   CSN: [id number]  Arrival date & time 03/26/13  2006   First MD Initiated Contact with Patient 03/26/13 2013      Chief Complaint  Patient presents with  . Eye Pain     The history is provided by the patient. No language interpreter was used.    HPI Comments: Harold Hall is a 67 y.o. male who presents to the Emergency Department complaining of L eye irritation starting about 3 hours ago. Pt states it feels like something in L eye. Pt states he was mowing grass when the irritation started. He describes the irritation as scratching. He has washed his eye out and used eye drops with no relief. He does not wear contacts. He denies blurred vision.    Past Medical History  Diagnosis Date  . Allergic rhinitis   . Hypertension   . RLS (restless legs syndrome)   . COPD (chronic obstructive pulmonary disease)   . Gout   . History of kidney cancer   . Arthritis   . Hernia   . Sinus problem   . Erectile dysfunction   . Hypercholesterolemia   . Cancer     left renal -solitary kidney    Past Surgical History  Procedure Laterality Date  . Left kidney removed  1994  . Hernia repair  06/04/11    Family History  Problem Relation Age of Onset  . COPD Mother   . Hypertension Mother   . Osteoporosis Mother   . Prostate cancer Father   . Cancer Father     prostate    History  Substance Use Topics  . Smoking status: Former Smoker -- 1.50 packs/day for 50 years    Types: Cigarettes    Quit date: 06/04/2011  . Smokeless tobacco: Not on file  . Alcohol Use: 0.0 oz/week    2-3 Cans of beer per week     Comment: beers      Review of Systems  Eyes: Positive for pain and redness. Negative for discharge and visual disturbance.    Allergies   Review of patient's allergies indicates no known allergies.  Home Medications   Current Outpatient Rx  Name  Route  Sig  Dispense  Refill  . acetaminophen (TYLENOL) 325 MG tablet   Oral   Take 650 mg by mouth every 6 (six) hours as needed.           Marland Kitchen albuterol (PROVENTIL HFA;VENTOLIN HFA) 108 (90 BASE) MCG/ACT inhaler   Inhalation   Inhale 2 puffs into the lungs every 6 (six) hours as needed for wheezing.   3 Inhaler   4   . atorvastatin (LIPITOR) 20 MG tablet   Oral   Take 20 mg by mouth daily.           Marland Kitchen azelastine (ASTELIN) 137 MCG/SPRAY nasal spray   Nasal   Place 1 spray into the nose as needed. Use in each nostril as directed          . cetirizine (ZYRTEC) 10 MG tablet   Oral   Take 10 mg by mouth daily.           Marland Kitchen dextromethorphan-guaiFENesin (MUCINEX DM) 30-600 MG per 12 hr tablet   Oral   Take 1 tablet by  mouth every 12 (twelve) hours as needed. One every 12 hours as needed          . FIBER PO   Oral   Take 625 mg by mouth 3 (three) times daily.           . fish oil-omega-3 fatty acids 1000 MG capsule   Oral   Take 1 capsule by mouth 3 (three) times daily.           . furosemide (LASIX) 40 MG tablet   Oral   Take 40 mg by mouth daily. Patient says he is taking 20 mg a day per Dr Shelle Iron.   Thurmond Butts says he is taking his Lasix every other day for the past 2 weeks         . levofloxacin (LEVAQUIN) 750 MG tablet   Oral   Take 1 tablet (750 mg total) by mouth daily.   7 tablet   0   . levofloxacin (LEVAQUIN) 750 MG tablet   Oral   Take 1 tablet (750 mg total) by mouth daily.   7 tablet   0   . levofloxacin (LEVAQUIN) 750 MG tablet   Oral   Take 1 tablet (750 mg total) by mouth daily.   7 tablet   0     CANCEL PREVIOUS RX FOR THIS DRUG...CANCEL ALL RX F ...   . losartan-hydrochlorothiazide (HYZAAR) 100-25 MG per tablet   Oral   Take 1 tablet by mouth daily.           . Multiple Vitamin (MULITIVITAMIN WITH MINERALS) TABS    Oral   Take 1 tablet by mouth daily.         . predniSONE (DELTASONE) 10 MG tablet      4 x 2 days, 3 x 2 days, 2 x 2 days, 1 x 2 days, then stop   20 tablet   0   . rOPINIRole (REQUIP) 0.5 MG tablet   Oral   Take 1 tablet (0.5 mg total) by mouth daily.   90 tablet   3   . SPIRIVA HANDIHALER 18 MCG inhalation capsule      INHALE THE CONTENTS OF 1 CAPSULE DAILY   1 capsule   3   . EXPIRED: SYMBICORT 160-4.5 MCG/ACT inhaler      INHALE 2 PUFFS BY MOUTH IN THE MORNING AND IN THE EVENING EACH DAY. RINSE MOUTH WELL. (NEED OFFICE VISIT WITH DOCTOR FOR REFILLS)   3 Inhaler   0   . SYMBICORT 160-4.5 MCG/ACT inhaler      INHALE 2 PUFFS INTO THE LUNGS TWICE DAILY   3 Inhaler   1   . temazepam (RESTORIL) 15 MG capsule   Oral   Take 15 mg by mouth at bedtime as needed.           . verapamil (CALAN-SR) 240 MG CR tablet   Oral   Take 240 mg by mouth at bedtime.           BP 159/83  Pulse 88  Temp(Src) 97.8 F (36.6 C) (Oral)  Resp 16  SpO2 91%  Physical Exam  Nursing note and vitals reviewed. Constitutional: He appears well-developed and well-nourished.  HENT:  Head: Normocephalic and atraumatic.  Eyes: Pupils are equal, round, and reactive to light. Left eye exhibits no discharge. Left conjunctiva is not injected.  Slit lamp exam:      The left eye shows no hyphema.  Normal appearing L eye and eyelid.  No corneal abrasion. Fluorescein stain negative. Punctate irregularity to the inner lower eyelid of L eye.   Neck: Neck supple. No tracheal deviation present. No thyromegaly present.  Cardiovascular: Normal rate, regular rhythm and normal heart sounds.   No murmur heard. Pulmonary/Chest: Effort normal and breath sounds normal.  Musculoskeletal: Normal range of motion. He exhibits no edema and no tenderness.  Neurological: He is alert. Coordination normal.  Psychiatric: He has a normal mood and affect.    ED Course  Procedures (including critical care  time)   DIAGNOSTIC STUDIES: Oxygen Saturation is 91% on 2.5 L, low by my interpretation.    COORDINATION OF CARE: 8:54 PM Discussed treatment plan with pt at bedside and pt agreed to plan.   Labs Reviewed - No data to display No results found.   1. Eye pain, left       MDM  Pt with possible foreign body in the eye, now with possible punctate laceration to the left lower inner eye lid. No foreign bodies seen. Negative fluorescein stain. Pt is feeling better in ED prior to even any treatments. Visual acuity  20/20 left, 20/30 right. Plan to start on tobradex, follow up with ophthalmology if not improving. Pt's oxygen sat was also found to be low. He denies SOB, on Home O2, states that is normal for him.      I personally performed the services described in this documentation, which was scribed in my presence. The recorded information has been reviewed and is accurate.   Lottie Mussel, PA-C 03/27/13 [phone]

## 2013-03-26 NOTE — ED Notes (Signed)
Pt discharged.Vital signs stable and GCS 15 

## 2013-03-26 NOTE — ED Notes (Addendum)
C/o L eye irritation x 2 hours.  States it feels like something is in L eye.  Denies blurred vision. Pt wears home O2.

## 2013-03-26 NOTE — ED Notes (Signed)
OS 20/20 OD 20/30 OU 20/20

## 2013-03-27 NOTE — ED Provider Notes (Signed)
Medical screening examination/treatment/procedure(s) were performed by non-physician practitioner and as supervising physician I was immediately available for consultation/collaboration.   Carleene Cooper III, MD 03/27/13 1249

## 2013-03-27 NOTE — Telephone Encounter (Signed)
This smn was faxed fri 03/24/13 to [employer] .Kandice Hams

## 2013-03-28 ENCOUNTER — Encounter (HOSPITAL_COMMUNITY)
Admission: RE | Admit: 2013-03-28 | Discharge: 2013-03-28 | Disposition: A | Discharge status: Home / Self Care | Payer: Self-pay | Source: Ambulatory Visit | Attending: Pulmonary Disease | Admitting: Pulmonary Disease

## 2013-03-28 ENCOUNTER — Encounter (HOSPITAL_COMMUNITY): Payer: Self-pay

## 2013-03-30 ENCOUNTER — Encounter (HOSPITAL_COMMUNITY): Payer: Self-pay

## 2013-03-30 ENCOUNTER — Encounter (HOSPITAL_COMMUNITY)
Admission: RE | Admit: 2013-03-30 | Discharge: 2013-03-30 | Disposition: A | Discharge status: Home / Self Care | Payer: Self-pay | Source: Ambulatory Visit | Attending: Pulmonary Disease | Admitting: Pulmonary Disease

## 2013-04-04 ENCOUNTER — Encounter (HOSPITAL_COMMUNITY): Payer: Self-pay

## 2013-04-04 ENCOUNTER — Encounter (HOSPITAL_COMMUNITY)
Admission: RE | Admit: 2013-04-04 | Discharge: 2013-04-04 | Disposition: A | Discharge status: Home / Self Care | Payer: Self-pay | Source: Ambulatory Visit | Attending: Pulmonary Disease | Admitting: Pulmonary Disease

## 2013-04-05 ENCOUNTER — Other Ambulatory Visit: Payer: Self-pay | Admitting: Pulmonary Disease

## 2013-04-05 MED ORDER — ROPINIROLE HCL 0.5 MG PO TABS: 0.5000 mg | ORAL_TABLET | Freq: Every day | ORAL | Status: AC

## 2013-04-06 ENCOUNTER — Encounter (HOSPITAL_COMMUNITY)
Admission: RE | Admit: 2013-04-06 | Discharge: 2013-04-06 | Disposition: A | Discharge status: Home / Self Care | Payer: Self-pay | Source: Ambulatory Visit | Attending: Pulmonary Disease | Admitting: Pulmonary Disease

## 2013-04-06 ENCOUNTER — Encounter (HOSPITAL_COMMUNITY): Payer: Self-pay

## 2013-04-11 ENCOUNTER — Encounter (HOSPITAL_COMMUNITY)
Admission: RE | Admit: 2013-04-11 | Discharge: 2013-04-11 | Disposition: A | Discharge status: Home / Self Care | Payer: Self-pay | Source: Ambulatory Visit | Attending: Pulmonary Disease | Admitting: Pulmonary Disease

## 2013-04-11 ENCOUNTER — Encounter (HOSPITAL_COMMUNITY): Payer: Self-pay

## 2013-04-11 DIAGNOSIS — G2581 Restless legs syndrome: Secondary | ICD-10-CM | POA: Insufficient documentation

## 2013-04-11 DIAGNOSIS — J4489 Other specified chronic obstructive pulmonary disease: Secondary | ICD-10-CM | POA: Insufficient documentation

## 2013-04-11 DIAGNOSIS — J449 Chronic obstructive pulmonary disease, unspecified: Secondary | ICD-10-CM | POA: Insufficient documentation

## 2013-04-11 DIAGNOSIS — Z85528 Personal history of other malignant neoplasm of kidney: Secondary | ICD-10-CM | POA: Insufficient documentation

## 2013-04-11 DIAGNOSIS — Z5189 Encounter for other specified aftercare: Secondary | ICD-10-CM | POA: Insufficient documentation

## 2013-04-11 DIAGNOSIS — J984 Other disorders of lung: Secondary | ICD-10-CM | POA: Insufficient documentation

## 2013-04-13 ENCOUNTER — Encounter (HOSPITAL_COMMUNITY): Payer: Self-pay

## 2013-04-18 ENCOUNTER — Encounter (HOSPITAL_COMMUNITY)
Admission: RE | Admit: 2013-04-18 | Discharge: 2013-04-18 | Disposition: A | Discharge status: Home / Self Care | Payer: Self-pay | Source: Ambulatory Visit | Attending: Pulmonary Disease | Admitting: Pulmonary Disease

## 2013-04-18 ENCOUNTER — Encounter (HOSPITAL_COMMUNITY): Payer: Self-pay

## 2013-04-20 ENCOUNTER — Encounter (HOSPITAL_COMMUNITY)
Admission: RE | Admit: 2013-04-20 | Discharge: 2013-04-20 | Disposition: A | Discharge status: Home / Self Care | Payer: Self-pay | Source: Ambulatory Visit | Attending: Pulmonary Disease | Admitting: Pulmonary Disease

## 2013-04-20 ENCOUNTER — Encounter (HOSPITAL_COMMUNITY): Payer: Self-pay

## 2013-04-21 ENCOUNTER — Ambulatory Visit: Payer: BC Managed Care – PPO | Admitting: Pulmonary Disease

## 2013-04-25 ENCOUNTER — Encounter (HOSPITAL_COMMUNITY): Payer: Self-pay

## 2013-04-25 ENCOUNTER — Encounter (HOSPITAL_COMMUNITY)
Admission: RE | Admit: 2013-04-25 | Discharge: 2013-04-25 | Disposition: A | Discharge status: Home / Self Care | Payer: Self-pay | Source: Ambulatory Visit | Attending: Pulmonary Disease | Admitting: Pulmonary Disease

## 2013-04-27 ENCOUNTER — Encounter (HOSPITAL_COMMUNITY)
Admission: RE | Admit: 2013-04-27 | Discharge: 2013-04-27 | Disposition: A | Discharge status: Home / Self Care | Payer: Self-pay | Source: Ambulatory Visit | Attending: Pulmonary Disease | Admitting: Pulmonary Disease

## 2013-04-27 ENCOUNTER — Encounter (HOSPITAL_COMMUNITY): Payer: Self-pay

## 2013-05-01 ENCOUNTER — Encounter: Payer: Self-pay | Admitting: Pulmonary Disease

## 2013-05-01 ENCOUNTER — Ambulatory Visit (INDEPENDENT_AMBULATORY_CARE_PROVIDER_SITE_OTHER): Payer: BC Managed Care – PPO | Admitting: Pulmonary Disease

## 2013-05-01 VITALS — BP 150/78 | HR 56 | Temp 98.2°F | Ht 74.0 in | Wt 299.0 lb

## 2013-05-01 DIAGNOSIS — J449 Chronic obstructive pulmonary disease, unspecified: Secondary | ICD-10-CM

## 2013-05-01 NOTE — Patient Instructions (Addendum)
No change in breathing medications Continue to stay active followup with me in 4-74mos.

## 2013-05-01 NOTE — Progress Notes (Signed)
  Subjective:    Patient ID: Harold Hall, male    DOB: [birth year], 67 y.o.   MRN: [id number]  HPI Patient comes in today for followup of his known COPD.  He had an episode of acute bronchitis at the last visit, but responded well to a course of antibiotics.  He feels that he is back to baseline, and is participating aggressively in pulmonary rehabilitation.  He denies any significant cough, chest congestion, or purulence.  He is staying on his oxygen with exertional activity.   Review of Systems  Constitutional: Negative for fever and unexpected weight change.  HENT: Negative for ear pain, nosebleeds, congestion, sore throat, rhinorrhea, sneezing, trouble swallowing, dental problem, postnasal drip and sinus pressure.   Eyes: Negative for redness and itching.  Respiratory: Positive for shortness of breath. Negative for cough, chest tightness and wheezing.   Cardiovascular: Negative for palpitations and leg swelling.  Gastrointestinal: Negative for nausea and vomiting.  Genitourinary: Negative for dysuria.  Musculoskeletal: Negative for joint swelling.  Skin: Negative for rash.  Neurological: Negative for headaches.  Hematological: Does not bruise/bleed easily.  Psychiatric/Behavioral: Negative for dysphoric mood. The patient is not nervous/anxious.        Objective:   Physical Exam Overweight male in no acute distress Nose without purulence or discharge noted Neck without lymphadenopathy or thyromegaly Chest with minimal decreased breath sounds, no wheezing Cardiac exam with regular rate and rhythm Lower extremities with mild edema, no cyanosis Alert and oriented, moves all 4 extremities.       Assessment & Plan:

## 2013-05-01 NOTE — Assessment & Plan Note (Signed)
The patient is doing fairly well from a pulmonary standpoint, he continues to participate actively in pulmonary rehabilitation.  I've asked him to continue his current bronchodilator regimen, stay on his exertional it should, and to work more aggressively on weight loss.  He will followup with me in 6 months if doing well.

## 2013-05-02 ENCOUNTER — Encounter (HOSPITAL_COMMUNITY)
Admission: RE | Admit: 2013-05-02 | Discharge: 2013-05-02 | Disposition: A | Discharge status: Home / Self Care | Payer: Self-pay | Source: Ambulatory Visit | Attending: Pulmonary Disease | Admitting: Pulmonary Disease

## 2013-05-04 ENCOUNTER — Encounter (HOSPITAL_COMMUNITY)
Admission: RE | Admit: 2013-05-04 | Discharge: 2013-05-04 | Disposition: A | Discharge status: Home / Self Care | Payer: Self-pay | Source: Ambulatory Visit | Attending: Pulmonary Disease | Admitting: Pulmonary Disease

## 2013-05-09 ENCOUNTER — Encounter (HOSPITAL_COMMUNITY)
Admission: RE | Admit: 2013-05-09 | Discharge: 2013-05-09 | Disposition: A | Discharge status: Home / Self Care | Payer: Self-pay | Source: Ambulatory Visit | Attending: Pulmonary Disease | Admitting: Pulmonary Disease

## 2013-05-09 DIAGNOSIS — J984 Other disorders of lung: Secondary | ICD-10-CM | POA: Insufficient documentation

## 2013-05-09 DIAGNOSIS — J449 Chronic obstructive pulmonary disease, unspecified: Secondary | ICD-10-CM | POA: Insufficient documentation

## 2013-05-09 DIAGNOSIS — G2581 Restless legs syndrome: Secondary | ICD-10-CM | POA: Insufficient documentation

## 2013-05-09 DIAGNOSIS — Z85528 Personal history of other malignant neoplasm of kidney: Secondary | ICD-10-CM | POA: Insufficient documentation

## 2013-05-09 DIAGNOSIS — Z5189 Encounter for other specified aftercare: Secondary | ICD-10-CM | POA: Insufficient documentation

## 2013-05-09 DIAGNOSIS — J4489 Other specified chronic obstructive pulmonary disease: Secondary | ICD-10-CM | POA: Insufficient documentation

## 2013-05-11 ENCOUNTER — Encounter (HOSPITAL_COMMUNITY)
Admission: RE | Admit: 2013-05-11 | Discharge: 2013-05-11 | Disposition: A | Discharge status: Home / Self Care | Payer: Self-pay | Source: Ambulatory Visit | Attending: Pulmonary Disease | Admitting: Pulmonary Disease

## 2013-05-16 ENCOUNTER — Encounter (HOSPITAL_COMMUNITY)
Admission: RE | Admit: 2013-05-16 | Discharge: 2013-05-16 | Disposition: A | Discharge status: Home / Self Care | Payer: Self-pay | Source: Ambulatory Visit | Attending: Pulmonary Disease | Admitting: Pulmonary Disease

## 2013-05-18 ENCOUNTER — Encounter (HOSPITAL_COMMUNITY)
Admission: RE | Admit: 2013-05-18 | Discharge: 2013-05-18 | Disposition: A | Discharge status: Home / Self Care | Payer: Self-pay | Source: Ambulatory Visit | Attending: Pulmonary Disease | Admitting: Pulmonary Disease

## 2013-05-23 ENCOUNTER — Encounter (HOSPITAL_COMMUNITY)
Admission: RE | Admit: 2013-05-23 | Discharge: 2013-05-23 | Disposition: A | Discharge status: Home / Self Care | Payer: Self-pay | Source: Ambulatory Visit | Attending: Pulmonary Disease | Admitting: Pulmonary Disease

## 2013-05-25 ENCOUNTER — Encounter (HOSPITAL_COMMUNITY)
Admission: RE | Admit: 2013-05-25 | Discharge: 2013-05-25 | Disposition: A | Discharge status: Home / Self Care | Payer: Self-pay | Source: Ambulatory Visit | Attending: Pulmonary Disease | Admitting: Pulmonary Disease

## 2013-05-30 ENCOUNTER — Encounter (HOSPITAL_COMMUNITY)
Admission: RE | Admit: 2013-05-30 | Discharge: 2013-05-30 | Disposition: A | Discharge status: Home / Self Care | Payer: Self-pay | Source: Ambulatory Visit | Attending: Pulmonary Disease | Admitting: Pulmonary Disease

## 2013-06-01 ENCOUNTER — Encounter (HOSPITAL_COMMUNITY)
Admission: RE | Admit: 2013-06-01 | Discharge: 2013-06-01 | Disposition: A | Discharge status: Home / Self Care | Payer: Self-pay | Source: Ambulatory Visit | Attending: Pulmonary Disease | Admitting: Pulmonary Disease

## 2013-06-06 ENCOUNTER — Encounter (HOSPITAL_COMMUNITY): Payer: Self-pay

## 2013-06-08 ENCOUNTER — Encounter (HOSPITAL_COMMUNITY): Payer: Self-pay

## 2013-06-13 ENCOUNTER — Encounter (HOSPITAL_COMMUNITY)
Admission: RE | Admit: 2013-06-13 | Discharge: 2013-06-13 | Disposition: A | Discharge status: Home / Self Care | Payer: Self-pay | Source: Ambulatory Visit | Attending: Pulmonary Disease | Admitting: Pulmonary Disease

## 2013-06-13 DIAGNOSIS — J984 Other disorders of lung: Secondary | ICD-10-CM | POA: Insufficient documentation

## 2013-06-13 DIAGNOSIS — Z85528 Personal history of other malignant neoplasm of kidney: Secondary | ICD-10-CM | POA: Insufficient documentation

## 2013-06-13 DIAGNOSIS — G2581 Restless legs syndrome: Secondary | ICD-10-CM | POA: Insufficient documentation

## 2013-06-13 DIAGNOSIS — J4489 Other specified chronic obstructive pulmonary disease: Secondary | ICD-10-CM | POA: Insufficient documentation

## 2013-06-13 DIAGNOSIS — Z5189 Encounter for other specified aftercare: Secondary | ICD-10-CM | POA: Insufficient documentation

## 2013-06-13 DIAGNOSIS — J449 Chronic obstructive pulmonary disease, unspecified: Secondary | ICD-10-CM | POA: Insufficient documentation

## 2013-06-14 ENCOUNTER — Other Ambulatory Visit: Payer: Self-pay

## 2013-06-15 ENCOUNTER — Encounter (HOSPITAL_COMMUNITY)
Admission: RE | Admit: 2013-06-15 | Discharge: 2013-06-15 | Disposition: A | Discharge status: Home / Self Care | Payer: Self-pay | Source: Ambulatory Visit | Attending: Pulmonary Disease | Admitting: Pulmonary Disease

## 2013-06-20 ENCOUNTER — Encounter (HOSPITAL_COMMUNITY)
Admission: RE | Admit: 2013-06-20 | Discharge: 2013-06-20 | Disposition: A | Discharge status: Home / Self Care | Payer: Self-pay | Source: Ambulatory Visit | Attending: Pulmonary Disease | Admitting: Pulmonary Disease

## 2013-06-22 ENCOUNTER — Encounter (HOSPITAL_COMMUNITY)
Admission: RE | Admit: 2013-06-22 | Discharge: 2013-06-22 | Disposition: A | Discharge status: Home / Self Care | Payer: Self-pay | Source: Ambulatory Visit | Attending: Pulmonary Disease | Admitting: Pulmonary Disease

## 2013-06-27 ENCOUNTER — Other Ambulatory Visit: Payer: Self-pay | Admitting: Pulmonary Disease

## 2013-06-27 ENCOUNTER — Encounter (HOSPITAL_COMMUNITY): Payer: Self-pay

## 2013-06-29 ENCOUNTER — Encounter (HOSPITAL_COMMUNITY)
Admission: RE | Admit: 2013-06-29 | Discharge: 2013-06-29 | Disposition: A | Discharge status: Home / Self Care | Payer: Self-pay | Source: Ambulatory Visit | Attending: Pulmonary Disease | Admitting: Pulmonary Disease

## 2013-07-04 ENCOUNTER — Encounter (HOSPITAL_COMMUNITY)
Admission: RE | Admit: 2013-07-04 | Discharge: 2013-07-04 | Disposition: A | Discharge status: Home / Self Care | Payer: Self-pay | Source: Ambulatory Visit | Attending: Pulmonary Disease | Admitting: Pulmonary Disease

## 2013-07-06 ENCOUNTER — Encounter (HOSPITAL_COMMUNITY)
Admission: RE | Admit: 2013-07-06 | Discharge: 2013-07-06 | Disposition: A | Discharge status: Home / Self Care | Payer: Self-pay | Source: Ambulatory Visit | Attending: Pulmonary Disease | Admitting: Pulmonary Disease

## 2013-07-11 ENCOUNTER — Encounter (HOSPITAL_COMMUNITY)
Admission: RE | Admit: 2013-07-11 | Discharge: 2013-07-11 | Disposition: A | Discharge status: Home / Self Care | Payer: Self-pay | Source: Ambulatory Visit | Attending: Pulmonary Disease | Admitting: Pulmonary Disease

## 2013-07-11 DIAGNOSIS — J984 Other disorders of lung: Secondary | ICD-10-CM | POA: Insufficient documentation

## 2013-07-11 DIAGNOSIS — J449 Chronic obstructive pulmonary disease, unspecified: Secondary | ICD-10-CM | POA: Insufficient documentation

## 2013-07-11 DIAGNOSIS — Z5189 Encounter for other specified aftercare: Secondary | ICD-10-CM | POA: Insufficient documentation

## 2013-07-11 DIAGNOSIS — J4489 Other specified chronic obstructive pulmonary disease: Secondary | ICD-10-CM | POA: Insufficient documentation

## 2013-07-11 DIAGNOSIS — G2581 Restless legs syndrome: Secondary | ICD-10-CM | POA: Insufficient documentation

## 2013-07-11 DIAGNOSIS — Z85528 Personal history of other malignant neoplasm of kidney: Secondary | ICD-10-CM | POA: Insufficient documentation

## 2013-07-13 ENCOUNTER — Encounter (HOSPITAL_COMMUNITY)
Admission: RE | Admit: 2013-07-13 | Discharge: 2013-07-13 | Disposition: A | Discharge status: Home / Self Care | Payer: Self-pay | Source: Ambulatory Visit | Attending: Pulmonary Disease | Admitting: Pulmonary Disease

## 2013-07-18 ENCOUNTER — Encounter (HOSPITAL_COMMUNITY)
Admission: RE | Admit: 2013-07-18 | Discharge: 2013-07-18 | Disposition: A | Discharge status: Home / Self Care | Payer: Self-pay | Source: Ambulatory Visit | Attending: Pulmonary Disease | Admitting: Pulmonary Disease

## 2013-07-20 ENCOUNTER — Encounter (HOSPITAL_COMMUNITY): Payer: Self-pay

## 2013-07-25 ENCOUNTER — Encounter (HOSPITAL_COMMUNITY)
Admission: RE | Admit: 2013-07-25 | Discharge: 2013-07-25 | Disposition: A | Discharge status: Home / Self Care | Payer: Self-pay | Source: Ambulatory Visit | Attending: Pulmonary Disease | Admitting: Pulmonary Disease

## 2013-07-25 NOTE — Progress Notes (Signed)
Pt arrived a pulmonary rehab reported his amlodopine was increased from  36m to 10mg .  Medication list reconciled.  Exit BP- 120/68.  Will continue to monitor.  Pt states he also was started on new medication for gout.  Pt unsure of the name of this medication.  Pt will bring pill bottle with him to next class.  Pt reports medication changes were made by his PCP Dr Clovis Riley

## 2013-07-27 ENCOUNTER — Encounter (HOSPITAL_COMMUNITY)
Admission: RE | Admit: 2013-07-27 | Discharge: 2013-07-27 | Disposition: A | Discharge status: Home / Self Care | Payer: Self-pay | Source: Ambulatory Visit | Attending: Pulmonary Disease | Admitting: Pulmonary Disease

## 2013-08-01 ENCOUNTER — Encounter (HOSPITAL_COMMUNITY)
Admission: RE | Admit: 2013-08-01 | Discharge: 2013-08-01 | Disposition: A | Discharge status: Home / Self Care | Payer: Self-pay | Source: Ambulatory Visit | Attending: Pulmonary Disease | Admitting: Pulmonary Disease

## 2013-08-03 ENCOUNTER — Encounter (HOSPITAL_COMMUNITY)
Admission: RE | Admit: 2013-08-03 | Discharge: 2013-08-03 | Disposition: A | Discharge status: Home / Self Care | Payer: Self-pay | Source: Ambulatory Visit | Attending: Pulmonary Disease | Admitting: Pulmonary Disease

## 2013-08-08 ENCOUNTER — Encounter (HOSPITAL_COMMUNITY)
Admission: RE | Admit: 2013-08-08 | Discharge: 2013-08-08 | Disposition: A | Discharge status: Home / Self Care | Payer: Self-pay | Source: Ambulatory Visit | Attending: Pulmonary Disease | Admitting: Pulmonary Disease

## 2013-08-10 ENCOUNTER — Encounter (HOSPITAL_COMMUNITY)
Admission: RE | Admit: 2013-08-10 | Discharge: 2013-08-10 | Disposition: A | Discharge status: Home / Self Care | Payer: Self-pay | Source: Ambulatory Visit | Attending: Pulmonary Disease | Admitting: Pulmonary Disease

## 2013-08-10 DIAGNOSIS — J984 Other disorders of lung: Secondary | ICD-10-CM | POA: Insufficient documentation

## 2013-08-10 DIAGNOSIS — Z85528 Personal history of other malignant neoplasm of kidney: Secondary | ICD-10-CM | POA: Insufficient documentation

## 2013-08-10 DIAGNOSIS — G2581 Restless legs syndrome: Secondary | ICD-10-CM | POA: Insufficient documentation

## 2013-08-10 DIAGNOSIS — Z5189 Encounter for other specified aftercare: Secondary | ICD-10-CM | POA: Insufficient documentation

## 2013-08-10 DIAGNOSIS — J449 Chronic obstructive pulmonary disease, unspecified: Secondary | ICD-10-CM | POA: Insufficient documentation

## 2013-08-10 DIAGNOSIS — J4489 Other specified chronic obstructive pulmonary disease: Secondary | ICD-10-CM | POA: Insufficient documentation

## 2013-08-15 ENCOUNTER — Encounter (HOSPITAL_COMMUNITY): Payer: BC Managed Care – PPO

## 2013-08-17 ENCOUNTER — Encounter (HOSPITAL_COMMUNITY): Payer: BC Managed Care – PPO

## 2013-08-22 ENCOUNTER — Encounter (HOSPITAL_COMMUNITY)
Admission: RE | Admit: 2013-08-22 | Discharge: 2013-08-22 | Disposition: A | Discharge status: Home / Self Care | Payer: Self-pay | Source: Ambulatory Visit | Attending: Pulmonary Disease | Admitting: Pulmonary Disease

## 2013-08-24 ENCOUNTER — Encounter (HOSPITAL_COMMUNITY)
Admission: RE | Admit: 2013-08-24 | Discharge: 2013-08-24 | Disposition: A | Discharge status: Home / Self Care | Payer: Self-pay | Source: Ambulatory Visit | Attending: Pulmonary Disease | Admitting: Pulmonary Disease

## 2013-08-29 ENCOUNTER — Encounter (HOSPITAL_COMMUNITY)
Admission: RE | Admit: 2013-08-29 | Discharge: 2013-08-29 | Disposition: A | Discharge status: Home / Self Care | Payer: Self-pay | Source: Ambulatory Visit | Attending: Pulmonary Disease | Admitting: Pulmonary Disease

## 2013-08-31 ENCOUNTER — Encounter (HOSPITAL_COMMUNITY)
Admission: RE | Admit: 2013-08-31 | Discharge: 2013-08-31 | Disposition: A | Discharge status: Home / Self Care | Payer: Self-pay | Source: Ambulatory Visit | Attending: Pulmonary Disease | Admitting: Pulmonary Disease

## 2013-09-05 ENCOUNTER — Encounter (HOSPITAL_COMMUNITY)
Admission: RE | Admit: 2013-09-05 | Discharge: 2013-09-05 | Disposition: A | Discharge status: Home / Self Care | Payer: Self-pay | Source: Ambulatory Visit | Attending: Pulmonary Disease | Admitting: Pulmonary Disease

## 2013-09-07 ENCOUNTER — Encounter (HOSPITAL_COMMUNITY)
Admission: RE | Admit: 2013-09-07 | Discharge: 2013-09-07 | Disposition: A | Discharge status: Home / Self Care | Payer: Self-pay | Source: Ambulatory Visit | Attending: Pulmonary Disease | Admitting: Pulmonary Disease

## 2013-09-12 ENCOUNTER — Encounter (HOSPITAL_COMMUNITY)
Admission: RE | Admit: 2013-09-12 | Discharge: 2013-09-12 | Disposition: A | Discharge status: Home / Self Care | Payer: Self-pay | Source: Ambulatory Visit | Attending: Pulmonary Disease | Admitting: Pulmonary Disease

## 2013-09-12 DIAGNOSIS — J4489 Other specified chronic obstructive pulmonary disease: Secondary | ICD-10-CM | POA: Insufficient documentation

## 2013-09-12 DIAGNOSIS — Z85528 Personal history of other malignant neoplasm of kidney: Secondary | ICD-10-CM | POA: Insufficient documentation

## 2013-09-12 DIAGNOSIS — J984 Other disorders of lung: Secondary | ICD-10-CM | POA: Insufficient documentation

## 2013-09-12 DIAGNOSIS — J449 Chronic obstructive pulmonary disease, unspecified: Secondary | ICD-10-CM | POA: Insufficient documentation

## 2013-09-12 DIAGNOSIS — Z5189 Encounter for other specified aftercare: Secondary | ICD-10-CM | POA: Insufficient documentation

## 2013-09-12 DIAGNOSIS — G2581 Restless legs syndrome: Secondary | ICD-10-CM | POA: Insufficient documentation

## 2013-09-14 ENCOUNTER — Other Ambulatory Visit: Payer: Self-pay

## 2013-09-14 ENCOUNTER — Encounter (HOSPITAL_COMMUNITY)
Admission: RE | Admit: 2013-09-14 | Discharge: 2013-09-14 | Disposition: A | Discharge status: Home / Self Care | Payer: Self-pay | Source: Ambulatory Visit | Attending: Pulmonary Disease | Admitting: Pulmonary Disease

## 2013-09-19 ENCOUNTER — Encounter (HOSPITAL_COMMUNITY)
Admission: RE | Admit: 2013-09-19 | Discharge: 2013-09-19 | Disposition: A | Discharge status: Home / Self Care | Payer: Self-pay | Source: Ambulatory Visit | Attending: Pulmonary Disease | Admitting: Pulmonary Disease

## 2013-09-21 ENCOUNTER — Encounter (HOSPITAL_COMMUNITY)
Admission: RE | Admit: 2013-09-21 | Discharge: 2013-09-21 | Disposition: A | Discharge status: Home / Self Care | Payer: Self-pay | Source: Ambulatory Visit | Attending: Pulmonary Disease | Admitting: Pulmonary Disease

## 2013-09-25 ENCOUNTER — Ambulatory Visit (INDEPENDENT_AMBULATORY_CARE_PROVIDER_SITE_OTHER): Payer: BC Managed Care – PPO | Admitting: Adult Health

## 2013-09-25 ENCOUNTER — Encounter: Payer: Self-pay | Admitting: Adult Health

## 2013-09-25 VITALS — BP 140/84 | HR 74 | Temp 97.0°F | Ht 73.75 in | Wt 298.0 lb

## 2013-09-25 DIAGNOSIS — J441 Chronic obstructive pulmonary disease with (acute) exacerbation: Secondary | ICD-10-CM

## 2013-09-25 MED ORDER — CEFDINIR 300 MG PO CAPS: 300.0000 mg | ORAL_CAPSULE | Freq: Two times a day (BID) | ORAL | Status: DC

## 2013-09-25 MED ORDER — BUDESONIDE-FORMOTEROL FUMARATE 160-4.5 MCG/ACT IN AERO: INHALATION_SPRAY | RESPIRATORY_TRACT | Status: DC

## 2013-09-25 MED ORDER — LEVALBUTEROL HCL 0.63 MG/3ML IN NEBU
0.6300 mg | INHALATION_SOLUTION | Freq: Once | RESPIRATORY_TRACT | Status: AC
  Administered 2013-09-25: 0.63 mg via RESPIRATORY_TRACT

## 2013-09-25 MED ORDER — TIOTROPIUM BROMIDE MONOHYDRATE 18 MCG IN CAPS: ORAL_CAPSULE | RESPIRATORY_TRACT | Status: DC

## 2013-09-25 MED ORDER — ALBUTEROL SULFATE HFA 108 (90 BASE) MCG/ACT IN AERS: 2.0000 | INHALATION_SPRAY | RESPIRATORY_TRACT | Status: DC | PRN

## 2013-09-25 NOTE — Progress Notes (Signed)
  Subjective:    Patient ID: Harold Hall, male    DOB: 01-26-1946, 67 y.o.   MRN: [id number]  HPI 67 yo male with known COPD   09/25/2013 Acute OV  Complains of prod cough with thick gray mucus, wheezing, increased SOB, chest tightness, occ f/c/s x4days - denies head congestion, hemoptysis, nausea, vomiting, edema.    Review of Systems Constitutional:   No  weight loss, night sweats,  Fevers, chills,  +fatigue, or  lassitude.  HEENT:   No headaches,  Difficulty swallowing,  Tooth/dental problems, or  Sore throat,                No sneezing, itching, ear ache,  +nasal congestion, post nasal drip,   CV:  No chest pain,  Orthopnea, PND, swelling in lower extremities, anasarca, dizziness, palpitations, syncope.   GI  No heartburn, indigestion, abdominal pain, nausea, vomiting, diarrhea, change in bowel habits, loss of appetite, bloody stools.   Resp: .  No chest wall deformity  Skin: no rash or lesions.  GU: no dysuria, change in color of urine, no urgency or frequency.  No flank pain, no hematuria   MS:  No joint pain or swelling.  No decreased range of motion.  No back pain.  Psych:  No change in mood or affect. No depression or anxiety.  No memory loss.         Objective:   Physical Exam GEN: A/Ox3; pleasant , NAD, obese   HEENT:  [place]/AT,  EACs-clear, TMs-wnl, NOSE-clear, THROAT-clear, no lesions, no postnasal drip or exudate noted.   NECK:  Supple w/ fair ROM; no JVD; normal carotid impulses w/o bruits; no thyromegaly or nodules palpated; no lymphadenopathy.  RESP  Few rhonchi no accessory muscle use, no dullness to percussion  CARD:  RRR, no m/r/g  , tr-1+ peripheral edema, pulses intact, no cyanosis or clubbing.  GI:   Soft & nt; nml bowel sounds; no organomegaly or masses detected.  Musco: Warm bil, no deformities or joint swelling noted.   Neuro: alert, no focal deficits noted.    Skin: Warm, no lesions or rashes         Assessment & Plan:

## 2013-09-25 NOTE — Patient Instructions (Signed)
Omnicef 300 mg twice daily for 7 days, take with food .  Mucinex DM twice daily as needed. For cough and congestion. Follow Dr. Shelle Iron as planned and as needed. Please contact office for sooner follow up if symptoms do not improve or worsen or seek emergency care

## 2013-09-25 NOTE — Progress Notes (Signed)
Ov reviewed, and agree with plan as outlined.  

## 2013-09-25 NOTE — Addendum Note (Signed)
Addended by: Boone Master E on: 09/25/2013 10:09 AM   Modules accepted: Orders

## 2013-09-25 NOTE — Assessment & Plan Note (Signed)
Flare  Xopenex neb x 1   Plan  Omnicef 300 mg twice daily for 7 days, take with food .  Mucinex DM twice daily as needed. For cough and congestion. Follow Dr. Shelle Iron as planned and as needed. Please contact office for sooner follow up if symptoms do not improve or worsen or seek emergency care

## 2013-09-26 ENCOUNTER — Encounter (HOSPITAL_COMMUNITY): Payer: BC Managed Care – PPO

## 2013-09-28 ENCOUNTER — Telehealth: Payer: Self-pay | Admitting: Pulmonary Disease

## 2013-09-28 ENCOUNTER — Encounter (HOSPITAL_COMMUNITY): Payer: BC Managed Care – PPO

## 2013-09-28 MED ORDER — PREDNISONE 10 MG PO TABS: ORAL_TABLET | ORAL | Status: DC

## 2013-09-28 NOTE — Telephone Encounter (Signed)
Called and spoke with pt and he is aware of the pred taper per KC.  This has been sent to the pts pharmacy and pt is aware.

## 2013-09-28 NOTE — Telephone Encounter (Signed)
Would call in an 8day course of prednisone 40 to off.

## 2013-09-28 NOTE — Telephone Encounter (Signed)
Spoke to pt. Saw TP on 09/25/13 for chest congestion and cough. Was given Omnicef, this has relieved some of the congestion but the cough is still very much present. Has also been taking Mucinex DM and Alka Seltzer Cold and Sinus with not much relief. Would like KC recs.  KC - please advise.

## 2013-10-03 ENCOUNTER — Encounter (HOSPITAL_COMMUNITY)
Admission: RE | Admit: 2013-10-03 | Discharge: 2013-10-03 | Disposition: A | Discharge status: Home / Self Care | Payer: Self-pay | Source: Ambulatory Visit | Attending: Pulmonary Disease | Admitting: Pulmonary Disease

## 2013-10-05 ENCOUNTER — Encounter (HOSPITAL_COMMUNITY): Payer: BC Managed Care – PPO

## 2013-10-10 ENCOUNTER — Encounter (HOSPITAL_COMMUNITY)
Admission: RE | Admit: 2013-10-10 | Discharge: 2013-10-10 | Disposition: A | Discharge status: Home / Self Care | Payer: Self-pay | Source: Ambulatory Visit | Attending: Pulmonary Disease | Admitting: Pulmonary Disease

## 2013-10-10 DIAGNOSIS — Z5189 Encounter for other specified aftercare: Secondary | ICD-10-CM | POA: Insufficient documentation

## 2013-10-10 DIAGNOSIS — J449 Chronic obstructive pulmonary disease, unspecified: Secondary | ICD-10-CM | POA: Insufficient documentation

## 2013-10-10 DIAGNOSIS — J4489 Other specified chronic obstructive pulmonary disease: Secondary | ICD-10-CM | POA: Insufficient documentation

## 2013-10-10 DIAGNOSIS — Z85528 Personal history of other malignant neoplasm of kidney: Secondary | ICD-10-CM | POA: Insufficient documentation

## 2013-10-10 DIAGNOSIS — J984 Other disorders of lung: Secondary | ICD-10-CM | POA: Insufficient documentation

## 2013-10-10 DIAGNOSIS — G2581 Restless legs syndrome: Secondary | ICD-10-CM | POA: Insufficient documentation

## 2013-10-12 ENCOUNTER — Encounter (HOSPITAL_COMMUNITY)
Admission: RE | Admit: 2013-10-12 | Discharge: 2013-10-12 | Disposition: A | Discharge status: Home / Self Care | Payer: Self-pay | Source: Ambulatory Visit | Attending: Pulmonary Disease | Admitting: Pulmonary Disease

## 2013-10-17 ENCOUNTER — Encounter (HOSPITAL_COMMUNITY)
Admission: RE | Admit: 2013-10-17 | Discharge: 2013-10-17 | Disposition: A | Discharge status: Home / Self Care | Payer: Self-pay | Source: Ambulatory Visit | Attending: Pulmonary Disease | Admitting: Pulmonary Disease

## 2013-10-19 ENCOUNTER — Encounter (HOSPITAL_COMMUNITY): Payer: Self-pay

## 2013-10-24 ENCOUNTER — Encounter (HOSPITAL_COMMUNITY)
Admission: RE | Admit: 2013-10-24 | Discharge: 2013-10-24 | Disposition: A | Discharge status: Home / Self Care | Payer: Self-pay | Source: Ambulatory Visit | Attending: Pulmonary Disease | Admitting: Pulmonary Disease

## 2013-10-25 ENCOUNTER — Ambulatory Visit (INDEPENDENT_AMBULATORY_CARE_PROVIDER_SITE_OTHER): Payer: BC Managed Care – PPO | Admitting: Pulmonary Disease

## 2013-10-25 ENCOUNTER — Encounter: Payer: Self-pay | Admitting: Pulmonary Disease

## 2013-10-25 VITALS — BP 146/78 | HR 65 | Temp 97.8°F | Ht 73.75 in | Wt 297.0 lb

## 2013-10-25 DIAGNOSIS — J438 Other emphysema: Secondary | ICD-10-CM

## 2013-10-25 DIAGNOSIS — J439 Emphysema, unspecified: Secondary | ICD-10-CM

## 2013-10-25 NOTE — Patient Instructions (Signed)
No change in medications. Stay in rehab, work on weight loss followup with me in 6mos.

## 2013-10-25 NOTE — Assessment & Plan Note (Signed)
The pt is slowly returning to baseline since his acute exacerbation last month.  I have asked him to continue on his current meds, and to keep working in pulmonary rehab and on weight loss.  Will see him back in 6mos.

## 2013-10-25 NOTE — Progress Notes (Signed)
   Subjective:    Patient ID: Harold Hall, male    DOB: 03-May-1946, 67 y.o.   MRN: [id number]  HPI The patient comes in today for followup of his known COPD. He had a recent acute exacerbation in November of this year, and was treated by our nurse practitioner. He is definitely improved, and slowly returning to his usual baseline. He is continuing to participate in pulmonary rehabilitation, and is having no issues with his current medications.   Review of Systems  Constitutional: Negative for fever and unexpected weight change.  HENT: Negative for congestion, dental problem, ear pain, nosebleeds, postnasal drip, rhinorrhea, sinus pressure, sneezing, sore throat and trouble swallowing.   Eyes: Negative for redness and itching.  Respiratory: Positive for shortness of breath. Negative for cough, chest tightness and wheezing.   Cardiovascular: Negative for palpitations and leg swelling.  Gastrointestinal: Negative for nausea and vomiting.  Genitourinary: Negative for dysuria.  Musculoskeletal: Negative for joint swelling.  Skin: Negative for rash.  Neurological: Negative for headaches.  Hematological: Does not bruise/bleed easily.  Psychiatric/Behavioral: Negative for dysphoric mood. The patient is not nervous/anxious.        Objective:   Physical Exam Obese male in no acute distress Nose without purulence or discharge noted Neck without lymphadenopathy or thyromegaly Chest with mildly decreased breath sounds, but adequate air flow. No wheezes or rhonchi noted Cardiac exam with regular rate and rhythm Lower extremities with mild edema, no cyanosis Alert and oriented, moves all 4 extremities.       Assessment & Plan:

## 2013-10-26 ENCOUNTER — Encounter (HOSPITAL_COMMUNITY)
Admission: RE | Admit: 2013-10-26 | Discharge: 2013-10-26 | Disposition: A | Discharge status: Home / Self Care | Payer: Self-pay | Source: Ambulatory Visit | Attending: Pulmonary Disease | Admitting: Pulmonary Disease

## 2013-10-31 ENCOUNTER — Encounter (HOSPITAL_COMMUNITY)
Admission: RE | Admit: 2013-10-31 | Discharge: 2013-10-31 | Disposition: A | Discharge status: Home / Self Care | Payer: Self-pay | Source: Ambulatory Visit | Attending: Pulmonary Disease | Admitting: Pulmonary Disease

## 2013-11-07 ENCOUNTER — Encounter (HOSPITAL_COMMUNITY)
Admission: RE | Admit: 2013-11-07 | Discharge: 2013-11-07 | Disposition: A | Discharge status: Home / Self Care | Payer: Self-pay | Source: Ambulatory Visit | Attending: Pulmonary Disease | Admitting: Pulmonary Disease

## 2013-11-14 ENCOUNTER — Encounter (HOSPITAL_COMMUNITY)
Admission: RE | Admit: 2013-11-14 | Discharge: 2013-11-14 | Disposition: A | Discharge status: Home / Self Care | Payer: Self-pay | Source: Ambulatory Visit | Attending: Internal Medicine | Admitting: Internal Medicine

## 2013-11-14 DIAGNOSIS — G2581 Restless legs syndrome: Secondary | ICD-10-CM | POA: Insufficient documentation

## 2013-11-14 DIAGNOSIS — Z85528 Personal history of other malignant neoplasm of kidney: Secondary | ICD-10-CM | POA: Insufficient documentation

## 2013-11-14 DIAGNOSIS — Z5189 Encounter for other specified aftercare: Secondary | ICD-10-CM | POA: Insufficient documentation

## 2013-11-14 DIAGNOSIS — J4489 Other specified chronic obstructive pulmonary disease: Secondary | ICD-10-CM | POA: Insufficient documentation

## 2013-11-14 DIAGNOSIS — J984 Other disorders of lung: Secondary | ICD-10-CM | POA: Insufficient documentation

## 2013-11-14 DIAGNOSIS — J449 Chronic obstructive pulmonary disease, unspecified: Secondary | ICD-10-CM | POA: Insufficient documentation

## 2013-11-16 ENCOUNTER — Encounter (HOSPITAL_COMMUNITY)
Admission: RE | Admit: 2013-11-16 | Discharge: 2013-11-16 | Disposition: A | Discharge status: Home / Self Care | Payer: Self-pay | Source: Ambulatory Visit | Attending: Internal Medicine | Admitting: Internal Medicine

## 2013-11-21 ENCOUNTER — Encounter (HOSPITAL_COMMUNITY)
Admission: RE | Admit: 2013-11-21 | Discharge: 2013-11-21 | Disposition: A | Discharge status: Home / Self Care | Payer: Self-pay | Source: Ambulatory Visit | Attending: Internal Medicine | Admitting: Internal Medicine

## 2013-11-23 ENCOUNTER — Encounter (HOSPITAL_COMMUNITY)
Admission: RE | Admit: 2013-11-23 | Discharge: 2013-11-23 | Disposition: A | Discharge status: Home / Self Care | Payer: Self-pay | Source: Ambulatory Visit | Attending: Internal Medicine | Admitting: Internal Medicine

## 2013-11-28 ENCOUNTER — Encounter (HOSPITAL_COMMUNITY)
Admission: RE | Admit: 2013-11-28 | Discharge: 2013-11-28 | Disposition: A | Discharge status: Home / Self Care | Payer: Self-pay | Source: Ambulatory Visit | Attending: Internal Medicine | Admitting: Internal Medicine

## 2013-11-30 ENCOUNTER — Encounter (HOSPITAL_COMMUNITY)
Admission: RE | Admit: 2013-11-30 | Discharge: 2013-11-30 | Disposition: A | Discharge status: Home / Self Care | Payer: Self-pay | Source: Ambulatory Visit | Attending: Internal Medicine | Admitting: Internal Medicine

## 2013-12-05 ENCOUNTER — Encounter (HOSPITAL_COMMUNITY)
Admission: RE | Admit: 2013-12-05 | Discharge: 2013-12-05 | Disposition: A | Discharge status: Home / Self Care | Payer: Self-pay | Source: Ambulatory Visit | Attending: Internal Medicine | Admitting: Internal Medicine

## 2013-12-07 ENCOUNTER — Encounter (HOSPITAL_COMMUNITY): Payer: Self-pay

## 2013-12-12 ENCOUNTER — Encounter (HOSPITAL_COMMUNITY)
Admission: RE | Admit: 2013-12-12 | Discharge: 2013-12-12 | Disposition: A | Discharge status: Home / Self Care | Payer: Self-pay | Source: Ambulatory Visit | Attending: Internal Medicine | Admitting: Internal Medicine

## 2013-12-12 DIAGNOSIS — Z85528 Personal history of other malignant neoplasm of kidney: Secondary | ICD-10-CM | POA: Insufficient documentation

## 2013-12-12 DIAGNOSIS — J449 Chronic obstructive pulmonary disease, unspecified: Secondary | ICD-10-CM | POA: Insufficient documentation

## 2013-12-12 DIAGNOSIS — G2581 Restless legs syndrome: Secondary | ICD-10-CM | POA: Insufficient documentation

## 2013-12-12 DIAGNOSIS — J984 Other disorders of lung: Secondary | ICD-10-CM | POA: Insufficient documentation

## 2013-12-12 DIAGNOSIS — Z5189 Encounter for other specified aftercare: Secondary | ICD-10-CM | POA: Insufficient documentation

## 2013-12-12 DIAGNOSIS — J4489 Other specified chronic obstructive pulmonary disease: Secondary | ICD-10-CM | POA: Insufficient documentation

## 2013-12-14 ENCOUNTER — Encounter (HOSPITAL_COMMUNITY)
Admission: RE | Admit: 2013-12-14 | Discharge: 2013-12-14 | Disposition: A | Discharge status: Home / Self Care | Payer: Self-pay | Source: Ambulatory Visit | Attending: Internal Medicine | Admitting: Internal Medicine

## 2013-12-19 ENCOUNTER — Encounter (HOSPITAL_COMMUNITY)
Admission: RE | Admit: 2013-12-19 | Discharge: 2013-12-19 | Disposition: A | Discharge status: Home / Self Care | Payer: Self-pay | Source: Ambulatory Visit | Attending: Internal Medicine | Admitting: Internal Medicine

## 2013-12-21 ENCOUNTER — Encounter (HOSPITAL_COMMUNITY)
Admission: RE | Admit: 2013-12-21 | Discharge: 2013-12-21 | Disposition: A | Discharge status: Home / Self Care | Payer: Self-pay | Source: Ambulatory Visit | Attending: Internal Medicine | Admitting: Internal Medicine

## 2013-12-24 ENCOUNTER — Encounter: Payer: Self-pay | Admitting: Pulmonary Disease

## 2013-12-25 MED ORDER — TIOTROPIUM BROMIDE MONOHYDRATE 18 MCG IN CAPS: ORAL_CAPSULE | RESPIRATORY_TRACT | Status: DC

## 2013-12-25 NOTE — Telephone Encounter (Signed)
Rx was sent to pharm

## 2013-12-26 ENCOUNTER — Encounter (HOSPITAL_COMMUNITY): Payer: Self-pay

## 2013-12-28 ENCOUNTER — Encounter (HOSPITAL_COMMUNITY)
Admission: RE | Admit: 2013-12-28 | Discharge: 2013-12-28 | Disposition: A | Discharge status: Home / Self Care | Payer: Self-pay | Source: Ambulatory Visit | Attending: Internal Medicine | Admitting: Internal Medicine

## 2014-01-02 ENCOUNTER — Encounter (HOSPITAL_COMMUNITY): Payer: Self-pay

## 2014-01-04 ENCOUNTER — Encounter (HOSPITAL_COMMUNITY): Payer: Self-pay

## 2014-01-08 ENCOUNTER — Encounter: Payer: Self-pay | Admitting: Pulmonary Disease

## 2014-01-08 ENCOUNTER — Ambulatory Visit (INDEPENDENT_AMBULATORY_CARE_PROVIDER_SITE_OTHER): Payer: BC Managed Care – PPO | Admitting: Pulmonary Disease

## 2014-01-08 VITALS — BP 122/80 | HR 57 | Temp 97.6°F | Ht 74.0 in | Wt 299.0 lb

## 2014-01-08 DIAGNOSIS — J438 Other emphysema: Secondary | ICD-10-CM

## 2014-01-08 DIAGNOSIS — J069 Acute upper respiratory infection, unspecified: Secondary | ICD-10-CM

## 2014-01-08 DIAGNOSIS — J439 Emphysema, unspecified: Secondary | ICD-10-CM

## 2014-01-08 NOTE — Assessment & Plan Note (Signed)
His history seems most consistent with a viral URI rather than acute bacterial sinusitis. I have asked him to continue with his symptom treatment, as well as his saline rinses. Will ask him to use Afrin for the next 4-5 days only to help with nasal hygiene, and I have offered to treat him with a short course of prednisone to help with mucosal inflammation and drainage. He would prefer to avoid this at this time. I would like to avoid any antibiotics until we have a more convincing scenario for a bacterial infection. He is to let us know if he is not improving, and certainly if he is worsening.

## 2014-01-08 NOTE — Assessment & Plan Note (Signed)
The patient is fairly stable from a pulmonary standpoint currently, with no increased shortness of breath or cough/purulence. He has no bronchospasm on exam.

## 2014-01-08 NOTE — Progress Notes (Signed)
   Subjective:    Patient ID: Harold Hall, male    DOB: January 21, 1946, 68 y.o.   MRN: [id number]  HPI The patient comes in today for an acute sick visit. He has known COPD, but is not having breathing issues currently. He notes worsening sinus congestion, postnasal drip, and a sore throat.  He has not had any fever or purulent drainage. He has had some sinus pressure. He denies any chest congestion, increased cough, or increased shortness of breath. He has been taking an over-the-counter cold preparation, and also using saline rinses.   Review of Systems  Constitutional: Positive for fever ( low grade). Negative for unexpected weight change.  HENT: Positive for congestion, postnasal drip, rhinorrhea, sinus pressure and sore throat. Negative for dental problem, ear pain, nosebleeds, sneezing and trouble swallowing.   Eyes: Negative for redness and itching.  Respiratory: Positive for cough and shortness of breath. Negative for chest tightness and wheezing.   Cardiovascular: Negative for palpitations and leg swelling.  Gastrointestinal: Negative for nausea and vomiting.  Genitourinary: Negative for dysuria.  Musculoskeletal: Negative for joint swelling.  Skin: Negative for rash.  Neurological: Positive for headaches.  Hematological: Does not bruise/bleed easily.  Psychiatric/Behavioral: Negative for dysphoric mood. The patient is not nervous/anxious.        Objective:   Physical Exam Obese male in no acute distress Nose with mild erythema, but no edema or purulence noted.  No palpable sinus pressure. Oropharynx clear, no exudates posteriorly or erythema Neck without lymphadenopathy or thyromegaly Chest with mildly decreased breath sounds, no wheezes or rhonchi Cardiac exam with regular rate and rhythm Lower extremities with minimal edema, no cyanosis. Alert and oriented, moves all 4 extremities.       Assessment & Plan:

## 2014-01-08 NOTE — Patient Instructions (Signed)
Continue nettie pot am and pm until better. Can use afrin 2 sprays each nostril (after rinses) up to 3 times a day for only 5 days.  No longer than this. Continue over the counter cold and sinus medication If you begin to produce discolored mucus, or if you are not improving, please call so we can discuss possible antibiotic. Can treat with a short course of prednisone to help with inflammation and congestion in your sinuses.  Let me know Keep usual followup apptm.

## 2014-01-09 ENCOUNTER — Encounter (HOSPITAL_COMMUNITY): Payer: BC Managed Care – PPO

## 2014-01-09 DIAGNOSIS — J4489 Other specified chronic obstructive pulmonary disease: Secondary | ICD-10-CM | POA: Insufficient documentation

## 2014-01-09 DIAGNOSIS — Z5189 Encounter for other specified aftercare: Secondary | ICD-10-CM | POA: Insufficient documentation

## 2014-01-09 DIAGNOSIS — G2581 Restless legs syndrome: Secondary | ICD-10-CM | POA: Insufficient documentation

## 2014-01-09 DIAGNOSIS — Z85528 Personal history of other malignant neoplasm of kidney: Secondary | ICD-10-CM | POA: Insufficient documentation

## 2014-01-09 DIAGNOSIS — J984 Other disorders of lung: Secondary | ICD-10-CM | POA: Insufficient documentation

## 2014-01-09 DIAGNOSIS — J449 Chronic obstructive pulmonary disease, unspecified: Secondary | ICD-10-CM | POA: Insufficient documentation

## 2014-01-11 ENCOUNTER — Encounter (HOSPITAL_COMMUNITY)
Admission: RE | Admit: 2014-01-11 | Discharge: 2014-01-11 | Disposition: A | Discharge status: Home / Self Care | Payer: Self-pay | Source: Ambulatory Visit | Attending: Internal Medicine | Admitting: Internal Medicine

## 2014-01-16 ENCOUNTER — Encounter (HOSPITAL_COMMUNITY)
Admission: RE | Admit: 2014-01-16 | Discharge: 2014-01-16 | Disposition: A | Discharge status: Home / Self Care | Payer: Self-pay | Source: Ambulatory Visit | Attending: Internal Medicine | Admitting: Internal Medicine

## 2014-01-18 ENCOUNTER — Encounter (HOSPITAL_COMMUNITY)
Admission: RE | Admit: 2014-01-18 | Discharge: 2014-01-18 | Disposition: A | Discharge status: Home / Self Care | Payer: Self-pay | Source: Ambulatory Visit | Attending: Internal Medicine | Admitting: Internal Medicine

## 2014-01-23 ENCOUNTER — Encounter (HOSPITAL_COMMUNITY)
Admission: RE | Admit: 2014-01-23 | Discharge: 2014-01-23 | Disposition: A | Discharge status: Home / Self Care | Payer: Self-pay | Source: Ambulatory Visit | Attending: Internal Medicine | Admitting: Internal Medicine

## 2014-01-25 ENCOUNTER — Encounter (HOSPITAL_COMMUNITY)
Admission: RE | Admit: 2014-01-25 | Discharge: 2014-01-25 | Disposition: A | Discharge status: Home / Self Care | Payer: Self-pay | Source: Ambulatory Visit | Attending: Internal Medicine | Admitting: Internal Medicine

## 2014-01-30 ENCOUNTER — Encounter (HOSPITAL_COMMUNITY)
Admission: RE | Admit: 2014-01-30 | Discharge: 2014-01-30 | Disposition: A | Discharge status: Home / Self Care | Payer: Self-pay | Source: Ambulatory Visit | Attending: Internal Medicine | Admitting: Internal Medicine

## 2014-02-01 ENCOUNTER — Encounter (HOSPITAL_COMMUNITY)
Admission: RE | Admit: 2014-02-01 | Discharge: 2014-02-01 | Disposition: A | Discharge status: Home / Self Care | Payer: Self-pay | Source: Ambulatory Visit | Attending: Internal Medicine | Admitting: Internal Medicine

## 2014-02-06 ENCOUNTER — Encounter (HOSPITAL_COMMUNITY)
Admission: RE | Admit: 2014-02-06 | Discharge: 2014-02-06 | Disposition: A | Discharge status: Home / Self Care | Payer: Self-pay | Source: Ambulatory Visit | Attending: Internal Medicine | Admitting: Internal Medicine

## 2014-02-08 ENCOUNTER — Encounter (HOSPITAL_COMMUNITY)
Admission: RE | Admit: 2014-02-08 | Discharge: 2014-02-08 | Disposition: A | Discharge status: Home / Self Care | Payer: Self-pay | Source: Ambulatory Visit | Attending: Internal Medicine | Admitting: Internal Medicine

## 2014-02-08 DIAGNOSIS — G2581 Restless legs syndrome: Secondary | ICD-10-CM | POA: Insufficient documentation

## 2014-02-08 DIAGNOSIS — J984 Other disorders of lung: Secondary | ICD-10-CM | POA: Insufficient documentation

## 2014-02-08 DIAGNOSIS — J4489 Other specified chronic obstructive pulmonary disease: Secondary | ICD-10-CM | POA: Insufficient documentation

## 2014-02-08 DIAGNOSIS — Z5189 Encounter for other specified aftercare: Secondary | ICD-10-CM | POA: Insufficient documentation

## 2014-02-08 DIAGNOSIS — J449 Chronic obstructive pulmonary disease, unspecified: Secondary | ICD-10-CM | POA: Insufficient documentation

## 2014-02-08 DIAGNOSIS — Z85528 Personal history of other malignant neoplasm of kidney: Secondary | ICD-10-CM | POA: Insufficient documentation

## 2014-02-13 ENCOUNTER — Encounter (HOSPITAL_COMMUNITY)
Admission: RE | Admit: 2014-02-13 | Discharge: 2014-02-13 | Disposition: A | Discharge status: Home / Self Care | Payer: Self-pay | Source: Ambulatory Visit | Attending: Internal Medicine | Admitting: Internal Medicine

## 2014-02-15 ENCOUNTER — Encounter (HOSPITAL_COMMUNITY)
Admission: RE | Admit: 2014-02-15 | Discharge: 2014-02-15 | Disposition: A | Discharge status: Home / Self Care | Payer: Self-pay | Source: Ambulatory Visit | Attending: Internal Medicine | Admitting: Internal Medicine

## 2014-02-20 ENCOUNTER — Encounter (HOSPITAL_COMMUNITY): Payer: Self-pay

## 2014-02-22 ENCOUNTER — Encounter (HOSPITAL_COMMUNITY): Payer: Self-pay

## 2014-02-27 ENCOUNTER — Encounter (HOSPITAL_COMMUNITY)
Admission: RE | Admit: 2014-02-27 | Discharge: 2014-02-27 | Disposition: A | Discharge status: Home / Self Care | Payer: Self-pay | Source: Ambulatory Visit | Attending: Internal Medicine | Admitting: Internal Medicine

## 2014-03-01 ENCOUNTER — Encounter (HOSPITAL_COMMUNITY)
Admission: RE | Admit: 2014-03-01 | Discharge: 2014-03-01 | Disposition: A | Discharge status: Home / Self Care | Payer: Self-pay | Source: Ambulatory Visit | Attending: Internal Medicine | Admitting: Internal Medicine

## 2014-03-06 ENCOUNTER — Encounter (HOSPITAL_COMMUNITY)
Admission: RE | Admit: 2014-03-06 | Discharge: 2014-03-06 | Disposition: A | Discharge status: Home / Self Care | Payer: Self-pay | Source: Ambulatory Visit | Attending: Internal Medicine | Admitting: Internal Medicine

## 2014-03-08 ENCOUNTER — Encounter (HOSPITAL_COMMUNITY)
Admission: RE | Admit: 2014-03-08 | Discharge: 2014-03-08 | Disposition: A | Discharge status: Home / Self Care | Payer: Self-pay | Source: Ambulatory Visit | Attending: Internal Medicine | Admitting: Internal Medicine

## 2014-03-13 ENCOUNTER — Encounter (HOSPITAL_COMMUNITY)
Admission: RE | Admit: 2014-03-13 | Discharge: 2014-03-13 | Disposition: A | Discharge status: Home / Self Care | Payer: Self-pay | Source: Ambulatory Visit | Attending: Internal Medicine | Admitting: Internal Medicine

## 2014-03-13 DIAGNOSIS — J4489 Other specified chronic obstructive pulmonary disease: Secondary | ICD-10-CM | POA: Insufficient documentation

## 2014-03-13 DIAGNOSIS — Z85528 Personal history of other malignant neoplasm of kidney: Secondary | ICD-10-CM | POA: Insufficient documentation

## 2014-03-13 DIAGNOSIS — G2581 Restless legs syndrome: Secondary | ICD-10-CM | POA: Insufficient documentation

## 2014-03-13 DIAGNOSIS — Z5189 Encounter for other specified aftercare: Secondary | ICD-10-CM | POA: Insufficient documentation

## 2014-03-13 DIAGNOSIS — J984 Other disorders of lung: Secondary | ICD-10-CM | POA: Insufficient documentation

## 2014-03-13 DIAGNOSIS — J449 Chronic obstructive pulmonary disease, unspecified: Secondary | ICD-10-CM | POA: Insufficient documentation

## 2014-03-15 ENCOUNTER — Encounter (HOSPITAL_COMMUNITY)
Admission: RE | Admit: 2014-03-15 | Discharge: 2014-03-15 | Disposition: A | Discharge status: Home / Self Care | Payer: Self-pay | Source: Ambulatory Visit | Attending: Internal Medicine | Admitting: Internal Medicine

## 2014-03-20 ENCOUNTER — Encounter (HOSPITAL_COMMUNITY)
Admission: RE | Admit: 2014-03-20 | Discharge: 2014-03-20 | Disposition: A | Discharge status: Home / Self Care | Payer: Self-pay | Source: Ambulatory Visit | Attending: Internal Medicine | Admitting: Internal Medicine

## 2014-03-22 ENCOUNTER — Encounter (HOSPITAL_COMMUNITY)
Admission: RE | Admit: 2014-03-22 | Discharge: 2014-03-22 | Disposition: A | Discharge status: Home / Self Care | Payer: Self-pay | Source: Ambulatory Visit | Attending: Internal Medicine | Admitting: Internal Medicine

## 2014-03-27 ENCOUNTER — Encounter (HOSPITAL_COMMUNITY)
Admission: RE | Admit: 2014-03-27 | Discharge: 2014-03-27 | Disposition: A | Discharge status: Home / Self Care | Payer: Self-pay | Source: Ambulatory Visit | Attending: Internal Medicine | Admitting: Internal Medicine

## 2014-03-29 ENCOUNTER — Encounter (HOSPITAL_COMMUNITY)
Admission: RE | Admit: 2014-03-29 | Discharge: 2014-03-29 | Disposition: A | Discharge status: Home / Self Care | Payer: Self-pay | Source: Ambulatory Visit | Attending: Internal Medicine | Admitting: Internal Medicine

## 2014-04-03 ENCOUNTER — Encounter (HOSPITAL_COMMUNITY)
Admission: RE | Admit: 2014-04-03 | Discharge: 2014-04-03 | Disposition: A | Discharge status: Home / Self Care | Payer: Self-pay | Source: Ambulatory Visit | Attending: Internal Medicine | Admitting: Internal Medicine

## 2014-04-05 ENCOUNTER — Encounter (HOSPITAL_COMMUNITY)
Admission: RE | Admit: 2014-04-05 | Discharge: 2014-04-05 | Disposition: A | Discharge status: Home / Self Care | Payer: Self-pay | Source: Ambulatory Visit | Attending: Internal Medicine | Admitting: Internal Medicine

## 2014-04-09 ENCOUNTER — Ambulatory Visit (INDEPENDENT_AMBULATORY_CARE_PROVIDER_SITE_OTHER): Payer: BC Managed Care – PPO | Admitting: Internal Medicine

## 2014-04-09 ENCOUNTER — Encounter: Payer: Self-pay | Admitting: Internal Medicine

## 2014-04-09 VITALS — BP 138/52 | HR 80 | Temp 98.9°F | Ht 74.0 in | Wt 299.8 lb

## 2014-04-09 DIAGNOSIS — J439 Emphysema, unspecified: Secondary | ICD-10-CM

## 2014-04-09 DIAGNOSIS — J209 Acute bronchitis, unspecified: Secondary | ICD-10-CM

## 2014-04-09 DIAGNOSIS — J438 Other emphysema: Secondary | ICD-10-CM

## 2014-04-09 MED ORDER — TRAMADOL HCL 50 MG PO TABS: ORAL_TABLET | ORAL | Status: DC

## 2014-04-09 MED ORDER — PREDNISONE 10 MG PO TABS: ORAL_TABLET | ORAL | Status: DC

## 2014-04-09 MED ORDER — AZITHROMYCIN 250 MG PO TABS: ORAL_TABLET | ORAL | Status: DC

## 2014-04-09 NOTE — Progress Notes (Signed)
Subjective:     Patient ID: NOA CONSTANTE, male   DOB: 1946-08-10 MRN: [id number]  HPI  42 yowm quit smoking 2012 with COPD GOLD III 2012 followed by Dr Gwenette Greet on 02 3lpm at bedtime and walking with flares with sinus drainage last eval 01/08/14 rec otcs no abx / pred   04/09/2014  Acute  ov/Seanpatrick Maisano re: on spiriva/ symbicort 160 2bid Chief Complaint  Patient presents with  . Acute Visit    Pt c/o thick yellow mucous with cough, low grade fever, c/o constant chest tightness, and increase in SOB x 3 days. Pt has taen mucinex dm BID, night time cough and cold med, and dayquil.    still only using the saba rarely with main c/o severe noct coughing but increase doe even on 02 but not at rest. States flares always start abruptly with sinus drainage as was the case here and constant sensation of pnds   No obvious day to day or daytime variabilty or assoc overt  hb symptoms. No unusual exp hx or h/o childhood pna/ asthma or knowledge of premature birth.   Also denies any obvious fluctuation of symptoms with weather or environmental changes or other aggravating or alleviating factors except as outlined above   Current Medications, Allergies, Complete Past Medical History, Past Surgical History, Family History, and Social History were reviewed in [employer] record.  ROS  The following are not active complaints unless bolded sore throat, dysphagia, dental problems, itching, sneezing,  nasal congestion or excess/ purulent secretions, ear ache,   fever, chills, sweats, unintended wt loss, pleuritic or exertional cp, hemoptysis,  orthopnea pnd or leg swelling, presyncope, palpitations, heartburn, abdominal pain, anorexia, nausea, vomiting, diarrhea  or change in bowel or urinary habits, change in stools or urine, dysuria,hematuria,  rash, arthralgias, visual complaints, headache, numbness weakness or ataxia or problems with walking or coordination,  change in mood/affect or memory.      Review of Systems     Objective:   Physical Exam    amb slt hoarse wm nad no increased wob on 3lpm   Wt Readings from Last 3 Encounters:  04/09/14 299 lb 12.8 oz (135.988 kg)  01/08/14 299 lb (135.626 kg)  10/25/13 297 lb (134.718 kg)      HEENT mild turbinate edema.  Oropharynx no thrush or excess pnd or cobblestoning.  No JVD or cervical adenopathy. Mild accessory muscle hypertrophy. Trachea midline, nl thryroid. Chest was hyperinflated by percussion with diminished breath sounds and moderate increased exp time without wheeze. Hoover sign positive at mid inspiration. Regular rate and rhythm without murmur gallop or rub or increase P2 or edema.  Abd: no hsm, nl excursion. Ext warm without cyanosis or clubbing.     Sinus ct 11/11/11  No advanced disease. Few opacified ethmoid air cells. Some  mucosal thickening along the floor of the left maxillary sinus   Assessment:

## 2014-04-09 NOTE — Patient Instructions (Signed)
Take mucinex dm 1200 every 12 hours and supplement if needed with  tramadol 50 mg up to 1 every 4 hours to suppress the urge to cough. Swallowing water or using ice chips/non mint and menthol containing candies (such as lifesavers or sugarless jolly ranchers) are also effective.  You should rest your voice and avoid activities that you know make you cough.  Once you have eliminated the cough for 3 straight days try reducing the tramadol first,  then the mucinex dm  as tolerated.    Add pepcid ac 20 mg at bedtime as long as coughing  zpak / Prednisone 10 mg take  4 each am x 2 days,   2 each am x 2 days,  1 each am x 2 days and stop   Leave fish oil and chocolate while coughing or clearing your throat

## 2014-04-10 ENCOUNTER — Encounter (HOSPITAL_COMMUNITY): Payer: BC Managed Care – PPO

## 2014-04-10 DIAGNOSIS — G2581 Restless legs syndrome: Secondary | ICD-10-CM | POA: Insufficient documentation

## 2014-04-10 DIAGNOSIS — J449 Chronic obstructive pulmonary disease, unspecified: Secondary | ICD-10-CM | POA: Insufficient documentation

## 2014-04-10 DIAGNOSIS — J4489 Other specified chronic obstructive pulmonary disease: Secondary | ICD-10-CM | POA: Insufficient documentation

## 2014-04-10 DIAGNOSIS — J984 Other disorders of lung: Secondary | ICD-10-CM | POA: Insufficient documentation

## 2014-04-10 DIAGNOSIS — Z5189 Encounter for other specified aftercare: Secondary | ICD-10-CM | POA: Insufficient documentation

## 2014-04-10 DIAGNOSIS — Z85528 Personal history of other malignant neoplasm of kidney: Secondary | ICD-10-CM | POA: Insufficient documentation

## 2014-04-11 ENCOUNTER — Telehealth: Payer: Self-pay | Admitting: Pulmonary Disease

## 2014-04-11 ENCOUNTER — Ambulatory Visit (INDEPENDENT_AMBULATORY_CARE_PROVIDER_SITE_OTHER): Payer: BC Managed Care – PPO | Admitting: Pulmonary Disease

## 2014-04-11 ENCOUNTER — Encounter: Payer: Self-pay | Admitting: Pulmonary Disease

## 2014-04-11 ENCOUNTER — Ambulatory Visit (INDEPENDENT_AMBULATORY_CARE_PROVIDER_SITE_OTHER)
Admission: RE | Admit: 2014-04-11 | Discharge: 2014-04-11 | Disposition: A | Discharge status: Home / Self Care | Payer: BC Managed Care – PPO | Source: Ambulatory Visit | Attending: Pulmonary Disease | Admitting: Pulmonary Disease

## 2014-04-11 VITALS — BP 130/72 | HR 77 | Temp 98.7°F | Ht 74.0 in | Wt 304.0 lb

## 2014-04-11 DIAGNOSIS — J441 Chronic obstructive pulmonary disease with (acute) exacerbation: Secondary | ICD-10-CM

## 2014-04-11 DIAGNOSIS — J9610 Chronic respiratory failure, unspecified whether with hypoxia or hypercapnia: Secondary | ICD-10-CM | POA: Insufficient documentation

## 2014-04-11 DIAGNOSIS — J439 Emphysema, unspecified: Secondary | ICD-10-CM

## 2014-04-11 MED ORDER — PREDNISONE 10 MG PO TABS: ORAL_TABLET | ORAL | Status: DC

## 2014-04-11 MED ORDER — LEVOFLOXACIN 750 MG PO TABS: 750.0000 mg | ORAL_TABLET | Freq: Every day | ORAL | Status: DC

## 2014-04-11 NOTE — Assessment & Plan Note (Signed)
DDX of  difficult airways managment all start with A and  include Adherence, Ace Inhibitors, Acid Reflux, Active Sinus Disease, Alpha 1 Antitripsin deficiency, Anxiety masquerading as Airways dz,  ABPA,  allergy(esp in young), Aspiration (esp in elderly), Adverse effects of DPI,  Active smokers, plus two Bs  = Bronchiectasis and Beta blocker use..and one C= CHF    Adherence is always the initial "prime suspect" and is a multilayered concern that requires a "trust but verify" approach in every patient - starting with knowing how to use medications, especially inhalers, correctly, keeping up with refills and understanding the fundamental difference between maintenance and prns vs those medications only taken for a very short course and then stopped and not refilled.   Explained natural history of uri and why it's necessary in patients at risk to treat GERD aggressively  at least  short term   to reduce risk of evolving cyclical cough initially  triggered by epithelial injury and a heightened sensitivty to the effects of any upper airway irritants,  most importantly acid - related.  That is, the more sensitive the epithelium damaged for virus, the more the cough, the more the secondary reflux (especially in those prone to reflux) the more the irritation of the sensitive mucosa and so on in a cyclical pattern.   ? Active sinus dz > try zpak then consider broader rx or repeat sinus ct  See instructions for specific recommendations which were reviewed directly with the patient who was given a copy with highlighter outlining the key components.

## 2014-04-11 NOTE — Progress Notes (Signed)
   Subjective:    Patient ID: Harold Hall, male    DOB: Mar 19, 1946, 68 y.o.   MRN: [id number]  HPI Patient comes in today for an acute sick visit. He has known severe COPD with chronic respiratory failure. He was seen on Monday by one of my partners for a 3 to four-day history of increasing chest congestion, cough with purulent mucus, and increased shortness of breath. He was treated with a course of prednisone and a Z-Pak, but has continued to have worsening shortness of breath since that visit. He tells me that his saturations have been in the 70s and 80s, but are actually 90-93% today on pulsed oxygen. His cough and congestion is better, but he is still bringing up purulent mucus. He is more concerned about his persistent dyspnea on exertion. He is in no respiratory distress today, and is not using accessory muscles.   Review of Systems  Constitutional: Negative for fever and unexpected weight change.  HENT: Negative for congestion, dental problem, ear pain, nosebleeds, postnasal drip, rhinorrhea, sinus pressure, sneezing, sore throat and trouble swallowing.   Eyes: Negative for redness and itching.  Respiratory: Positive for cough, chest tightness and shortness of breath. Negative for wheezing.   Cardiovascular: Negative for palpitations and leg swelling.  Gastrointestinal: Negative for nausea and vomiting.  Genitourinary: Negative for dysuria.  Musculoskeletal: Negative for joint swelling.  Skin: Negative for rash.  Neurological: Negative for headaches.  Hematological: Does not bruise/bleed easily.  Psychiatric/Behavioral: Negative for dysphoric mood. The patient is not nervous/anxious.        Objective:   Physical Exam Obese male in no acute distress Nose without purulence or discharge noted Neck without lymphadenopathy or thyromegaly  Chest with decreased breath sounds but adequate airflow, no active wheezes or rhonchi Cardiac exam with regular rate and rhythm Lower  extremities with minimal edema, no cyanosis Alert and oriented, moves all 4 extremities.       Assessment & Plan:

## 2014-04-11 NOTE — Telephone Encounter (Signed)
Spoke with [name]  She states that pt's breathing is worse since seen for acute ov 04/09/14 She states that he gets out of breath just walking across the room  OV with [place] at 10 am today

## 2014-04-11 NOTE — Assessment & Plan Note (Signed)
GOLD III and actually relatively well compensated despite acute cough so no change rx    Each maintenance medication was reviewed in detail including most importantly the difference between maintenance and as needed and under what circumstances the prns are to be used.  Please see instructions for details which were reviewed in writing and the patient given a copy.

## 2014-04-11 NOTE — Patient Instructions (Signed)
Will give steroid shot, and put you back on an 8 day steroid taper. Change zpak to levaquin 750mg  one a day for 5 days Will check cxr today, and call you with results. Stay at home, no activity If you are not much better in 3-4 days, let me know and we will look at other possible causes for your shortness of breath.

## 2014-04-11 NOTE — Telephone Encounter (Signed)
Spoke with the pt's spouse and notified of recs per [facility] She verbalized understanding  Nothing further needed

## 2014-04-11 NOTE — Telephone Encounter (Signed)
Called spoke with patient who reported that he has been having some sporadic symptoms today that have worsened as the afternoon progresses: - sees hallucinations while watching TV > figures and animals that aren't there - sometimes a flashing light off to the side  - dizziness - feels disoriented/"tripping" - feels like it's harder for his words to come out  Pt denies any increased SOB, blurred vision, weakness, tightness, hemoptysis, nausea, vomiting, orthopnea.  Pt sats while on the phone were 92% and HR was 72.  Pt has not taken the Levaquin yet today.  The only new meds he has taken are the zpak and tramadol from [facility] on 6/1 > last took tramadol today at 0600.  Received depo at the office today.  Advised pt will forward message to [facility] for recs, but he was made aware to call 911 immediately if his symptoms worsen before hearing back from our office.

## 2014-04-11 NOTE — Telephone Encounter (Signed)
Of all the things listed, would think tramadol can do this.  I agree that if his symptoms persist or if they worsen, needs to go to ER for evaluation.

## 2014-04-11 NOTE — Assessment & Plan Note (Signed)
The patient is having persistent shortness of breath and falling oxygen saturations with activity, despite being on a short prednisone taper and Z-Pak. He feels that his cough and congestion is a little better, but his dyspnea on exertion remains very abnormal. His saturations today on his pulsed oxygen are adequate. He does not have bronchospasm on exam. At this point, I would like to treat more aggressively for her airway inflammation, and place him on a broader spectrum antibiotic. Will also check a chest x-ray for completeness. If he continues to have shortness of breath above his usual baseline over the next 3-4 days, will consider evaluating for thromboembolic disease or possibly superimposed cardiac disease.

## 2014-04-12 ENCOUNTER — Encounter (HOSPITAL_COMMUNITY): Payer: Self-pay

## 2014-04-12 DIAGNOSIS — J9610 Chronic respiratory failure, unspecified whether with hypoxia or hypercapnia: Secondary | ICD-10-CM

## 2014-04-12 DIAGNOSIS — J441 Chronic obstructive pulmonary disease with (acute) exacerbation: Secondary | ICD-10-CM

## 2014-04-12 DIAGNOSIS — J438 Other emphysema: Secondary | ICD-10-CM

## 2014-04-12 MED ORDER — METHYLPREDNISOLONE ACETATE 80 MG/ML IJ SUSP
120.0000 mg | Freq: Once | INTRAMUSCULAR | Status: AC
  Administered 2014-04-12: 120 mg via INTRAMUSCULAR

## 2014-04-12 NOTE — Addendum Note (Signed)
Addended by: Lilli Few on: 04/12/2014 12:34 PM   Modules accepted: Orders

## 2014-04-17 ENCOUNTER — Encounter (HOSPITAL_COMMUNITY): Payer: Self-pay

## 2014-04-19 ENCOUNTER — Encounter (HOSPITAL_COMMUNITY): Payer: Self-pay

## 2014-04-24 ENCOUNTER — Encounter (HOSPITAL_COMMUNITY)
Admission: RE | Admit: 2014-04-24 | Discharge: 2014-04-24 | Disposition: A | Discharge status: Home / Self Care | Payer: Self-pay | Source: Ambulatory Visit | Attending: Internal Medicine | Admitting: Internal Medicine

## 2014-04-25 ENCOUNTER — Encounter: Payer: Self-pay | Admitting: Pulmonary Disease

## 2014-04-25 ENCOUNTER — Ambulatory Visit (INDEPENDENT_AMBULATORY_CARE_PROVIDER_SITE_OTHER): Payer: BC Managed Care – PPO | Admitting: Pulmonary Disease

## 2014-04-25 VITALS — BP 120/62 | HR 75 | Temp 98.2°F | Ht 74.0 in | Wt 296.8 lb

## 2014-04-25 DIAGNOSIS — J9610 Chronic respiratory failure, unspecified whether with hypoxia or hypercapnia: Secondary | ICD-10-CM

## 2014-04-25 DIAGNOSIS — J438 Other emphysema: Secondary | ICD-10-CM

## 2014-04-25 DIAGNOSIS — J439 Emphysema, unspecified: Secondary | ICD-10-CM

## 2014-04-25 NOTE — Patient Instructions (Signed)
No change in maintenance medications. Keep working on weight loss and exercise program followup with me again in 37mos if doing well.

## 2014-04-25 NOTE — Progress Notes (Signed)
   Subjective:    Patient ID: Harold Hall, male    DOB: 1946/10/24, 68 y.o.   MRN: [id number]  HPI The patient comes in today for followup of his known severe COPD with chronic respiratory failure. He has had a recent acute exacerbation which required 2 rounds of antibiotics and prednisone. He feels much better today, and has basically returned back to his usual baseline other than weakness. He denies any cough or congestion, and is not producing purulence.   Review of Systems  Constitutional: Negative for fever and unexpected weight change.  HENT: Negative for congestion, dental problem, ear pain, nosebleeds, postnasal drip, rhinorrhea, sinus pressure, sneezing, sore throat and trouble swallowing.   Eyes: Negative for redness and itching.  Respiratory: Positive for shortness of breath. Negative for cough, chest tightness and wheezing.   Cardiovascular: Negative for palpitations and leg swelling.  Gastrointestinal: Negative for nausea and vomiting.  Genitourinary: Negative for dysuria.  Musculoskeletal: Negative for joint swelling.  Skin: Negative for rash.  Neurological: Negative for headaches.  Hematological: Does not bruise/bleed easily.  Psychiatric/Behavioral: Negative for dysphoric mood. The patient is not nervous/anxious.        Objective:   Physical Exam Obese male in nad Nose without purulence or d/c noted. Neck without LN or TMG Chest with decreased bs, no wheezing or rhonchi Cor with rrr LE with mild edema, no cyanosis Alert and oriented, moves all 4.        Assessment & Plan:

## 2014-04-25 NOTE — Assessment & Plan Note (Signed)
The patient is much improved after his recent acute exacerbation, and is nearly back to his usual baseline. I've asked him to continue on his maintenance bronchodilator regimen, and to continue working in pulmonary rehabilitation. I've also stressed to him the importance of aggressive weight loss

## 2014-04-26 ENCOUNTER — Encounter (HOSPITAL_COMMUNITY)
Admission: RE | Admit: 2014-04-26 | Discharge: 2014-04-26 | Disposition: A | Discharge status: Home / Self Care | Payer: Self-pay | Source: Ambulatory Visit | Attending: Internal Medicine | Admitting: Internal Medicine

## 2014-05-01 ENCOUNTER — Encounter (HOSPITAL_COMMUNITY)
Admission: RE | Admit: 2014-05-01 | Discharge: 2014-05-01 | Disposition: A | Discharge status: Home / Self Care | Payer: Self-pay | Source: Ambulatory Visit | Attending: Internal Medicine | Admitting: Internal Medicine

## 2014-05-03 ENCOUNTER — Encounter (HOSPITAL_COMMUNITY)
Admission: RE | Admit: 2014-05-03 | Discharge: 2014-05-03 | Disposition: A | Discharge status: Home / Self Care | Payer: Self-pay | Source: Ambulatory Visit | Attending: Internal Medicine | Admitting: Internal Medicine

## 2014-05-08 ENCOUNTER — Encounter (HOSPITAL_COMMUNITY)
Admission: RE | Admit: 2014-05-08 | Discharge: 2014-05-08 | Disposition: A | Discharge status: Home / Self Care | Payer: Self-pay | Source: Ambulatory Visit | Attending: Internal Medicine | Admitting: Internal Medicine

## 2014-05-10 ENCOUNTER — Encounter (HOSPITAL_COMMUNITY)
Admission: RE | Admit: 2014-05-10 | Discharge: 2014-05-10 | Disposition: A | Discharge status: Home / Self Care | Payer: Self-pay | Source: Ambulatory Visit | Attending: Internal Medicine | Admitting: Internal Medicine

## 2014-05-10 DIAGNOSIS — Z85528 Personal history of other malignant neoplasm of kidney: Secondary | ICD-10-CM | POA: Insufficient documentation

## 2014-05-10 DIAGNOSIS — J449 Chronic obstructive pulmonary disease, unspecified: Secondary | ICD-10-CM | POA: Insufficient documentation

## 2014-05-10 DIAGNOSIS — J4489 Other specified chronic obstructive pulmonary disease: Secondary | ICD-10-CM | POA: Insufficient documentation

## 2014-05-10 DIAGNOSIS — J984 Other disorders of lung: Secondary | ICD-10-CM | POA: Insufficient documentation

## 2014-05-10 DIAGNOSIS — G2581 Restless legs syndrome: Secondary | ICD-10-CM | POA: Insufficient documentation

## 2014-05-10 DIAGNOSIS — Z5189 Encounter for other specified aftercare: Secondary | ICD-10-CM | POA: Insufficient documentation

## 2014-05-15 ENCOUNTER — Encounter (HOSPITAL_COMMUNITY)
Admission: RE | Admit: 2014-05-15 | Discharge: 2014-05-15 | Disposition: A | Discharge status: Home / Self Care | Payer: Self-pay | Source: Ambulatory Visit | Attending: Internal Medicine | Admitting: Internal Medicine

## 2014-05-17 ENCOUNTER — Encounter (HOSPITAL_COMMUNITY)
Admission: RE | Admit: 2014-05-17 | Discharge: 2014-05-17 | Disposition: A | Discharge status: Home / Self Care | Payer: Self-pay | Source: Ambulatory Visit | Attending: Internal Medicine | Admitting: Internal Medicine

## 2014-05-22 ENCOUNTER — Encounter (HOSPITAL_COMMUNITY)
Admission: RE | Admit: 2014-05-22 | Discharge: 2014-05-22 | Disposition: A | Discharge status: Home / Self Care | Payer: Self-pay | Source: Ambulatory Visit | Attending: Internal Medicine | Admitting: Internal Medicine

## 2014-05-24 ENCOUNTER — Encounter (HOSPITAL_COMMUNITY)
Admission: RE | Admit: 2014-05-24 | Discharge: 2014-05-24 | Disposition: A | Discharge status: Home / Self Care | Payer: Self-pay | Source: Ambulatory Visit | Attending: Internal Medicine | Admitting: Internal Medicine

## 2014-05-29 ENCOUNTER — Encounter (HOSPITAL_COMMUNITY)
Admission: RE | Admit: 2014-05-29 | Discharge: 2014-05-29 | Disposition: A | Discharge status: Home / Self Care | Payer: Self-pay | Source: Ambulatory Visit | Attending: Internal Medicine | Admitting: Internal Medicine

## 2014-05-31 ENCOUNTER — Encounter (HOSPITAL_COMMUNITY)
Admission: RE | Admit: 2014-05-31 | Discharge: 2014-05-31 | Disposition: A | Discharge status: Home / Self Care | Payer: Self-pay | Source: Ambulatory Visit | Attending: Internal Medicine | Admitting: Internal Medicine

## 2014-06-05 ENCOUNTER — Encounter (HOSPITAL_COMMUNITY): Payer: Self-pay

## 2014-06-07 ENCOUNTER — Encounter (HOSPITAL_COMMUNITY)
Admission: RE | Admit: 2014-06-07 | Discharge: 2014-06-07 | Disposition: A | Discharge status: Home / Self Care | Payer: Self-pay | Source: Ambulatory Visit | Attending: Internal Medicine | Admitting: Internal Medicine

## 2014-06-12 ENCOUNTER — Encounter (HOSPITAL_COMMUNITY)
Admission: RE | Admit: 2014-06-12 | Discharge: 2014-06-12 | Disposition: A | Discharge status: Home / Self Care | Payer: Self-pay | Source: Ambulatory Visit | Attending: Internal Medicine | Admitting: Internal Medicine

## 2014-06-12 DIAGNOSIS — Z85528 Personal history of other malignant neoplasm of kidney: Secondary | ICD-10-CM | POA: Insufficient documentation

## 2014-06-12 DIAGNOSIS — J984 Other disorders of lung: Secondary | ICD-10-CM | POA: Insufficient documentation

## 2014-06-12 DIAGNOSIS — G2581 Restless legs syndrome: Secondary | ICD-10-CM | POA: Insufficient documentation

## 2014-06-12 DIAGNOSIS — J4489 Other specified chronic obstructive pulmonary disease: Secondary | ICD-10-CM | POA: Insufficient documentation

## 2014-06-12 DIAGNOSIS — Z5189 Encounter for other specified aftercare: Secondary | ICD-10-CM | POA: Insufficient documentation

## 2014-06-12 DIAGNOSIS — J449 Chronic obstructive pulmonary disease, unspecified: Secondary | ICD-10-CM | POA: Insufficient documentation

## 2014-06-14 ENCOUNTER — Encounter (HOSPITAL_COMMUNITY)
Admission: RE | Admit: 2014-06-14 | Discharge: 2014-06-14 | Disposition: A | Discharge status: Home / Self Care | Payer: Self-pay | Source: Ambulatory Visit | Attending: Internal Medicine | Admitting: Internal Medicine

## 2014-06-19 ENCOUNTER — Encounter (HOSPITAL_COMMUNITY): Payer: Self-pay

## 2014-06-21 ENCOUNTER — Encounter (HOSPITAL_COMMUNITY)
Admission: RE | Admit: 2014-06-21 | Discharge: 2014-06-21 | Disposition: A | Discharge status: Home / Self Care | Payer: Self-pay | Source: Ambulatory Visit | Attending: Internal Medicine | Admitting: Internal Medicine

## 2014-06-26 ENCOUNTER — Ambulatory Visit (INDEPENDENT_AMBULATORY_CARE_PROVIDER_SITE_OTHER): Payer: BC Managed Care – PPO | Admitting: Pulmonary Disease

## 2014-06-26 ENCOUNTER — Encounter: Payer: Self-pay | Admitting: Pulmonary Disease

## 2014-06-26 ENCOUNTER — Encounter (HOSPITAL_COMMUNITY)
Admission: RE | Admit: 2014-06-26 | Discharge: 2014-06-26 | Disposition: A | Discharge status: Home / Self Care | Payer: Self-pay | Source: Ambulatory Visit | Attending: Internal Medicine | Admitting: Internal Medicine

## 2014-06-26 VITALS — BP 132/82 | HR 97 | Temp 98.4°F | Ht 74.0 in | Wt 295.6 lb

## 2014-06-26 DIAGNOSIS — J438 Other emphysema: Secondary | ICD-10-CM

## 2014-06-26 DIAGNOSIS — J441 Chronic obstructive pulmonary disease with (acute) exacerbation: Secondary | ICD-10-CM

## 2014-06-26 DIAGNOSIS — J439 Emphysema, unspecified: Secondary | ICD-10-CM

## 2014-06-26 MED ORDER — PREDNISONE 10 MG PO TABS: ORAL_TABLET | ORAL | Status: DC

## 2014-06-26 MED ORDER — LEVOFLOXACIN 750 MG PO TABS: 750.0000 mg | ORAL_TABLET | Freq: Every day | ORAL | Status: DC

## 2014-06-26 NOTE — Patient Instructions (Signed)
levaquin 750mg  one a day for 7 days Will treat with an 8 day course of prednisone.  No change in usual medications Keep regular followup visit.  If you have another flare before then, would consider trying daliresp.

## 2014-06-26 NOTE — Assessment & Plan Note (Signed)
The patient is staying on his maintenance bronchodilator regimen, but is having a COPD exacerbation associated with a chest infection. Will treat him with a short course of prednisone, but if he continues to have these, will add daliresp.

## 2014-06-26 NOTE — Progress Notes (Signed)
   Subjective:    Patient ID: Harold Hall, male    DOB: December 27, 1945, 68 y.o.   MRN: [id number]  HPI Patient comes in today for an acute sick visit. He had been doing well until the last week when he began to have increasing chest congestion, cough with purulent mucus, as well as increased shortness of breath with wheezing. The patient recently discovered mold in his house from a leaking heart water heater, and is wondering if this is the cause of his recurrent pulmonary infections. He is staying on his aggressive bronchodilator regimen. He does have some sinus symptoms, but nothing to suggest acute or chronic sinusitis. He is not having a lot of reflux, nor does he have any issue with aspiration.   Review of Systems  Constitutional: Negative for fever and unexpected weight change.  HENT: Positive for congestion and sinus pressure. Negative for dental problem, ear pain, nosebleeds, postnasal drip, rhinorrhea, sneezing, sore throat and trouble swallowing.   Eyes: Negative for redness and itching.  Respiratory: Positive for cough and shortness of breath. Negative for chest tightness and wheezing.   Cardiovascular: Negative for palpitations and leg swelling.  Gastrointestinal: Negative for nausea and vomiting.  Genitourinary: Negative for dysuria.  Musculoskeletal: Negative for joint swelling.  Skin: Negative for rash.  Neurological: Negative for headaches.  Hematological: Does not bruise/bleed easily.  Psychiatric/Behavioral: Negative for dysphoric mood. The patient is not nervous/anxious.        Objective:   Physical Exam Overweight male in no acute distress Nose without purulence or discharge noted Neck without lymphadenopathy or thyromegaly Chest with mildly decreased breath sounds right base, no wheezing Cardiac exam with regular rate and rhythm Lower extremities with mild edema, no cyanosis Alert and oriented, moves all 4 extremities.       Assessment & Plan:

## 2014-06-26 NOTE — Assessment & Plan Note (Signed)
Patient is having an acute exacerbation secondary to another pulmonary infection. Will treat with an antibiotic and a course of prednisone, but ultimately we need to figure out why this is recurring on a regular basis. He is concerned that it may be related to mold in his house, and he denies any chronic sinus symptoms or ongoing aspiration. This may simply be a part of his underlying chronic lung disease, and would consider adding daliresp if it keeps occurring.

## 2014-06-28 ENCOUNTER — Encounter (HOSPITAL_COMMUNITY): Payer: Self-pay

## 2014-07-03 ENCOUNTER — Encounter (HOSPITAL_COMMUNITY): Payer: Self-pay

## 2014-07-05 ENCOUNTER — Encounter (HOSPITAL_COMMUNITY): Payer: Self-pay

## 2014-07-10 ENCOUNTER — Encounter (HOSPITAL_COMMUNITY)
Admission: RE | Admit: 2014-07-10 | Discharge: 2014-07-10 | Disposition: A | Discharge status: Home / Self Care | Payer: Self-pay | Source: Ambulatory Visit | Attending: Internal Medicine | Admitting: Internal Medicine

## 2014-07-10 DIAGNOSIS — Z85528 Personal history of other malignant neoplasm of kidney: Secondary | ICD-10-CM | POA: Insufficient documentation

## 2014-07-10 DIAGNOSIS — J4489 Other specified chronic obstructive pulmonary disease: Secondary | ICD-10-CM | POA: Insufficient documentation

## 2014-07-10 DIAGNOSIS — Z5189 Encounter for other specified aftercare: Secondary | ICD-10-CM | POA: Insufficient documentation

## 2014-07-10 DIAGNOSIS — J984 Other disorders of lung: Secondary | ICD-10-CM | POA: Insufficient documentation

## 2014-07-10 DIAGNOSIS — J449 Chronic obstructive pulmonary disease, unspecified: Secondary | ICD-10-CM | POA: Insufficient documentation

## 2014-07-10 DIAGNOSIS — G2581 Restless legs syndrome: Secondary | ICD-10-CM | POA: Insufficient documentation

## 2014-07-12 ENCOUNTER — Encounter (HOSPITAL_COMMUNITY)
Admission: RE | Admit: 2014-07-12 | Discharge: 2014-07-12 | Disposition: A | Discharge status: Home / Self Care | Payer: Self-pay | Source: Ambulatory Visit | Attending: Internal Medicine | Admitting: Internal Medicine

## 2014-07-17 ENCOUNTER — Encounter (HOSPITAL_COMMUNITY)
Admission: RE | Admit: 2014-07-17 | Discharge: 2014-07-17 | Disposition: A | Discharge status: Home / Self Care | Payer: Self-pay | Source: Ambulatory Visit | Attending: Internal Medicine | Admitting: Internal Medicine

## 2014-07-19 ENCOUNTER — Encounter (HOSPITAL_COMMUNITY)
Admission: RE | Admit: 2014-07-19 | Discharge: 2014-07-19 | Disposition: A | Discharge status: Home / Self Care | Payer: Self-pay | Source: Ambulatory Visit | Attending: Internal Medicine | Admitting: Internal Medicine

## 2014-07-24 ENCOUNTER — Encounter (HOSPITAL_COMMUNITY)
Admission: RE | Admit: 2014-07-24 | Discharge: 2014-07-24 | Disposition: A | Discharge status: Home / Self Care | Payer: Self-pay | Source: Ambulatory Visit | Attending: Internal Medicine | Admitting: Internal Medicine

## 2014-07-26 ENCOUNTER — Encounter (HOSPITAL_COMMUNITY)
Admission: RE | Admit: 2014-07-26 | Discharge: 2014-07-26 | Disposition: A | Discharge status: Home / Self Care | Payer: Self-pay | Source: Ambulatory Visit | Attending: Internal Medicine | Admitting: Internal Medicine

## 2014-07-31 ENCOUNTER — Encounter (HOSPITAL_COMMUNITY): Payer: Self-pay

## 2014-08-02 ENCOUNTER — Encounter (HOSPITAL_COMMUNITY): Payer: Self-pay

## 2014-08-07 ENCOUNTER — Encounter (HOSPITAL_COMMUNITY): Payer: Self-pay

## 2014-08-08 ENCOUNTER — Ambulatory Visit (INDEPENDENT_AMBULATORY_CARE_PROVIDER_SITE_OTHER): Payer: BC Managed Care – PPO | Admitting: Pulmonary Disease

## 2014-08-08 ENCOUNTER — Encounter: Payer: Self-pay | Admitting: Pulmonary Disease

## 2014-08-08 VITALS — BP 148/82 | HR 75 | Temp 98.1°F | Ht 74.0 in | Wt 296.0 lb

## 2014-08-08 DIAGNOSIS — J438 Other emphysema: Secondary | ICD-10-CM

## 2014-08-08 DIAGNOSIS — J9610 Chronic respiratory failure, unspecified whether with hypoxia or hypercapnia: Secondary | ICD-10-CM

## 2014-08-08 DIAGNOSIS — R0902 Hypoxemia: Secondary | ICD-10-CM

## 2014-08-08 DIAGNOSIS — J439 Emphysema, unspecified: Secondary | ICD-10-CM

## 2014-08-08 DIAGNOSIS — J9611 Chronic respiratory failure with hypoxia: Secondary | ICD-10-CM

## 2014-08-08 NOTE — Progress Notes (Signed)
   Subjective:    Patient ID: Harold Hall, male    DOB: [birth year], 68 y.o.   MRN: [id number]  HPI The patient comes in today for an acute sick visit. He has known severe COPD with chronic respiratory failure, and over the last few months has been having recurrent episodes of worsening dyspnea, as well as an atypical sensation in his chest with cough. He does not bring up mucus on a consistent basis. He is been treated with multiple rounds of antibiotics and prednisone without a significant change. He is scheduled to be evaluated by cardiology in the near future.   Review of Systems  Constitutional: Negative for fever and unexpected weight change.  HENT: Positive for congestion, postnasal drip, rhinorrhea, sinus pressure and sneezing. Negative for dental problem, ear pain, nosebleeds, sore throat and trouble swallowing.   Eyes: Negative for redness and itching.  Respiratory: Positive for cough, chest tightness, shortness of breath and wheezing.   Cardiovascular: Positive for leg swelling. Negative for palpitations.  Gastrointestinal: Negative for nausea and vomiting.  Genitourinary: Negative for dysuria.  Musculoskeletal: Negative for joint swelling.  Skin: Negative for rash.  Neurological: Negative for headaches.  Hematological: Does not bruise/bleed easily.  Psychiatric/Behavioral: Negative for dysphoric mood. The patient is not nervous/anxious.        Objective:   Physical Exam Obese male in no acute distress Nose without purulence or discharge noted Neck without lymphadenopathy or thyromegaly Chest with a few basilar crackles, but overall adequate airflow with no wheezing Cardiac exam with regular rate and rhythm Lower extremities with minimal edema, no cyanosis Alert and oriented, moves all 4 extremities.       Assessment & Plan:

## 2014-08-08 NOTE — Patient Instructions (Signed)
Will try daliresp 500mg  one each day.  Start today.  Please call me in 2 weeks with update, or sooner if you feel your breathing is getting worse. No change in your other breathing meds.

## 2014-08-08 NOTE — Assessment & Plan Note (Signed)
The patient has known severe COPD, but his episodes that he is describing are not classic for a COPD exacerbation. He is describing an atypical sensation in his chest which can result in cough, but he does not bring up a lot of mucus except intermittently. He is been treated with multiple rounds of antibiotics and prednisone without improvement. It is unclear whether there is an allergic component to his symptoms, whether he may have bronchiectasis, whether this may be simply chronic bronchitis, or whether he has underlying cardiac disease. I would like to give him a trial of daliresp since he is having questionable recurrent exacerbations and associated with chronic bronchitic symptoms. He tells me that he is also scheduled to have a full cardiac evaluation, which I totally agree with. It really bothers me that he does not see significant improvement with a prednisone taper, which suggest this may not be a pulmonary issue.

## 2014-08-09 ENCOUNTER — Encounter (HOSPITAL_COMMUNITY): Payer: BC Managed Care – PPO | Attending: Internal Medicine

## 2014-08-09 DIAGNOSIS — I10 Essential (primary) hypertension: Secondary | ICD-10-CM | POA: Insufficient documentation

## 2014-08-09 DIAGNOSIS — J9611 Chronic respiratory failure with hypoxia: Secondary | ICD-10-CM | POA: Insufficient documentation

## 2014-08-09 DIAGNOSIS — J439 Emphysema, unspecified: Secondary | ICD-10-CM | POA: Insufficient documentation

## 2014-08-13 ENCOUNTER — Other Ambulatory Visit: Payer: Self-pay | Admitting: Adult Health

## 2014-08-14 ENCOUNTER — Telehealth (HOSPITAL_COMMUNITY): Payer: Self-pay

## 2014-08-14 ENCOUNTER — Encounter (HOSPITAL_COMMUNITY): Admission: RE | Admit: 2014-08-14 | Payer: Self-pay | Source: Ambulatory Visit

## 2014-08-14 NOTE — Telephone Encounter (Signed)
Patient called to inform pulmonary rehab dept that he would not be attending today's exercise session. He stated that he has to see a cardiologist next week and would return once he has been medically cleared and his SOB/weakness has improved.

## 2014-08-16 ENCOUNTER — Encounter (HOSPITAL_COMMUNITY): Payer: Self-pay

## 2014-08-21 ENCOUNTER — Encounter (HOSPITAL_COMMUNITY): Payer: Self-pay

## 2014-08-23 ENCOUNTER — Encounter (HOSPITAL_COMMUNITY): Payer: Self-pay

## 2014-08-24 ENCOUNTER — Ambulatory Visit (INDEPENDENT_AMBULATORY_CARE_PROVIDER_SITE_OTHER): Payer: BC Managed Care – PPO | Admitting: Pulmonary Disease

## 2014-08-24 ENCOUNTER — Encounter: Payer: Self-pay | Admitting: Pulmonary Disease

## 2014-08-24 VITALS — BP 130/66 | HR 64 | Temp 97.1°F | Ht 74.0 in | Wt 293.6 lb

## 2014-08-24 DIAGNOSIS — J438 Other emphysema: Secondary | ICD-10-CM

## 2014-08-24 MED ORDER — ALPRAZOLAM 0.5 MG PO TABS: 0.5000 mg | ORAL_TABLET | Freq: Two times a day (BID) | ORAL | Status: AC

## 2014-08-24 NOTE — Progress Notes (Signed)
   Subjective:    Patient ID: Harold Hall, male    DOB: 1946-04-26, 68 y.o.   MRN: [id number]  HPI The patient comes in today for followup of his known COPD with chronic respiratory failure. He has been having recurrent episodes of acute exacerbations despite being treated with prednisone and antibiotics. At the last visit he was started on daliresp, and now sees a significant improvement in his chest congestion, cough, and mucus. He has not had any increased GI symptoms with this. He feels that his breathing has actually improved a little bit since the last visit. He is concerned about and increased anxiety and not feeling totally "right". He has been seen by cardiology who has scheduled him for an echo and a stress test, but there is also some concern about conduction abnormalities.   Review of Systems  Constitutional: Negative for fever and unexpected weight change.  HENT: Negative for congestion, dental problem, ear pain, nosebleeds, postnasal drip, rhinorrhea, sinus pressure, sneezing, sore throat and trouble swallowing.   Eyes: Negative for redness and itching.  Respiratory: Positive for chest tightness and shortness of breath. Negative for cough and wheezing.   Cardiovascular: Negative for palpitations and leg swelling.  Gastrointestinal: Positive for nausea. Negative for vomiting.  Genitourinary: Negative for dysuria.  Musculoskeletal: Negative for joint swelling.  Skin: Negative for rash.  Neurological: Negative for headaches.  Hematological: Does not bruise/bleed easily.  Psychiatric/Behavioral: Negative for dysphoric mood. The patient is not nervous/anxious.        Objective:   Physical Exam Obese male in no acute distress Nose without purulence or discharge noted Neck without lymphadenopathy or thyromegaly Chest with mildly decreased breath sounds, no wheezing Cardiac exam with regular rate and rhythm Lower extremities with minimal edema, no cyanosis Alert and  oriented, moves all 4 extremities.       Assessment & Plan:

## 2014-08-24 NOTE — Patient Instructions (Signed)
Continue on current medications, including daliresp since it has helped you. Will give you a prescription for xanax 0.5mg  to take one in am and pm to see if it will help with your anxiety.  Please discuss a more long term solution with your primary in next 3-4 weeks. Please have Dr. Einar Gip send me the results of your cardiac evaluation.  Keep your followup apptm with me, but call if having issues.

## 2014-08-24 NOTE — Assessment & Plan Note (Signed)
The patient has seen a significant improvement in his cough and congestion with daliresp, and feels that his breathing is better as well. He is also having a lot of anxiety, some of which may have been triggered by his cardiac evaluation. I will start him on a low-dose benzodiazepine, and give him enough to last the next 3-4 weeks, but I have asked him to discuss with his primary physician the long-term treatment of his anxiety.

## 2014-09-13 ENCOUNTER — Telehealth (HOSPITAL_COMMUNITY): Payer: Self-pay | Admitting: *Deleted

## 2014-09-18 ENCOUNTER — Encounter (HOSPITAL_COMMUNITY): Payer: Self-pay

## 2014-09-18 DIAGNOSIS — J9611 Chronic respiratory failure with hypoxia: Secondary | ICD-10-CM | POA: Insufficient documentation

## 2014-09-18 DIAGNOSIS — J439 Emphysema, unspecified: Secondary | ICD-10-CM | POA: Insufficient documentation

## 2014-09-18 DIAGNOSIS — I10 Essential (primary) hypertension: Secondary | ICD-10-CM | POA: Insufficient documentation

## 2014-09-20 ENCOUNTER — Encounter (HOSPITAL_COMMUNITY): Payer: Self-pay

## 2014-09-25 ENCOUNTER — Encounter (HOSPITAL_COMMUNITY)
Admission: RE | Admit: 2014-09-25 | Discharge: 2014-09-25 | Disposition: A | Discharge status: Home / Self Care | Payer: Self-pay | Source: Ambulatory Visit | Attending: Internal Medicine | Admitting: Internal Medicine

## 2014-09-27 ENCOUNTER — Encounter (HOSPITAL_COMMUNITY)
Admission: RE | Admit: 2014-09-27 | Discharge: 2014-09-27 | Disposition: A | Discharge status: Home / Self Care | Payer: Self-pay | Source: Ambulatory Visit | Attending: Pulmonary Disease | Admitting: Pulmonary Disease

## 2014-10-02 ENCOUNTER — Encounter (HOSPITAL_COMMUNITY)
Admission: RE | Admit: 2014-10-02 | Discharge: 2014-10-02 | Disposition: A | Discharge status: Home / Self Care | Payer: Self-pay | Source: Ambulatory Visit | Attending: Pulmonary Disease | Admitting: Pulmonary Disease

## 2014-10-04 ENCOUNTER — Encounter (HOSPITAL_COMMUNITY): Payer: Self-pay

## 2014-10-08 ENCOUNTER — Telehealth: Payer: Self-pay | Admitting: Pulmonary Disease

## 2014-10-08 MED ORDER — TIOTROPIUM BROMIDE MONOHYDRATE 18 MCG IN CAPS: ORAL_CAPSULE | RESPIRATORY_TRACT | Status: DC

## 2014-10-08 NOTE — Telephone Encounter (Signed)
Called spoke with pt. He needs a refill on spiriva sent to [facility]. I advised will do so. Nothing further needed

## 2014-10-09 ENCOUNTER — Encounter (HOSPITAL_COMMUNITY)
Admission: RE | Admit: 2014-10-09 | Discharge: 2014-10-09 | Disposition: A | Discharge status: Home / Self Care | Payer: Self-pay | Source: Ambulatory Visit | Attending: Internal Medicine | Admitting: Internal Medicine

## 2014-10-09 DIAGNOSIS — I10 Essential (primary) hypertension: Secondary | ICD-10-CM | POA: Insufficient documentation

## 2014-10-09 DIAGNOSIS — J9611 Chronic respiratory failure with hypoxia: Secondary | ICD-10-CM | POA: Insufficient documentation

## 2014-10-09 DIAGNOSIS — J439 Emphysema, unspecified: Secondary | ICD-10-CM | POA: Insufficient documentation

## 2014-10-11 ENCOUNTER — Encounter (HOSPITAL_COMMUNITY)
Admission: RE | Admit: 2014-10-11 | Discharge: 2014-10-11 | Disposition: A | Discharge status: Home / Self Care | Payer: Self-pay | Source: Ambulatory Visit | Attending: Pulmonary Disease | Admitting: Pulmonary Disease

## 2014-10-16 ENCOUNTER — Encounter (HOSPITAL_COMMUNITY)
Admission: RE | Admit: 2014-10-16 | Discharge: 2014-10-16 | Disposition: A | Discharge status: Home / Self Care | Payer: Self-pay | Source: Ambulatory Visit | Attending: Pulmonary Disease | Admitting: Pulmonary Disease

## 2014-10-18 ENCOUNTER — Encounter (HOSPITAL_COMMUNITY)
Admission: RE | Admit: 2014-10-18 | Discharge: 2014-10-18 | Disposition: A | Discharge status: Home / Self Care | Payer: Self-pay | Source: Ambulatory Visit | Attending: Pulmonary Disease | Admitting: Pulmonary Disease

## 2014-10-23 ENCOUNTER — Encounter (HOSPITAL_COMMUNITY)
Admission: RE | Admit: 2014-10-23 | Discharge: 2014-10-23 | Disposition: A | Discharge status: Home / Self Care | Payer: Self-pay | Source: Ambulatory Visit | Attending: Pulmonary Disease | Admitting: Pulmonary Disease

## 2014-10-25 ENCOUNTER — Encounter (HOSPITAL_COMMUNITY)
Admission: RE | Admit: 2014-10-25 | Discharge: 2014-10-25 | Disposition: A | Discharge status: Home / Self Care | Payer: Self-pay | Source: Ambulatory Visit | Attending: Pulmonary Disease | Admitting: Pulmonary Disease

## 2014-10-30 ENCOUNTER — Encounter (HOSPITAL_COMMUNITY)
Admission: RE | Admit: 2014-10-30 | Discharge: 2014-10-30 | Disposition: A | Discharge status: Home / Self Care | Payer: Self-pay | Source: Ambulatory Visit | Attending: Pulmonary Disease | Admitting: Pulmonary Disease

## 2014-11-01 ENCOUNTER — Encounter (HOSPITAL_COMMUNITY)
Admission: RE | Admit: 2014-11-01 | Discharge: 2014-11-01 | Disposition: A | Discharge status: Home / Self Care | Payer: Self-pay | Source: Ambulatory Visit | Attending: Pulmonary Disease | Admitting: Pulmonary Disease

## 2014-11-05 ENCOUNTER — Telehealth: Payer: Self-pay | Admitting: Pulmonary Disease

## 2014-11-05 MED ORDER — ROFLUMILAST 500 MCG PO TABS: 500.0000 ug | ORAL_TABLET | Freq: Every day | ORAL | Status: DC

## 2014-11-05 NOTE — Telephone Encounter (Signed)
Called and spoke with pt and he is aware of refill that has been sent to his pharmacy.  Nothing further is needed

## 2014-11-06 ENCOUNTER — Encounter (HOSPITAL_COMMUNITY): Payer: Self-pay

## 2014-11-08 ENCOUNTER — Encounter (HOSPITAL_COMMUNITY): Payer: Self-pay

## 2014-11-13 ENCOUNTER — Encounter (HOSPITAL_COMMUNITY): Payer: Self-pay

## 2014-11-13 DIAGNOSIS — J439 Emphysema, unspecified: Secondary | ICD-10-CM | POA: Insufficient documentation

## 2014-11-13 DIAGNOSIS — J9611 Chronic respiratory failure with hypoxia: Secondary | ICD-10-CM | POA: Insufficient documentation

## 2014-11-13 DIAGNOSIS — I10 Essential (primary) hypertension: Secondary | ICD-10-CM | POA: Insufficient documentation

## 2014-11-15 ENCOUNTER — Encounter (HOSPITAL_COMMUNITY)
Admission: RE | Admit: 2014-11-15 | Discharge: 2014-11-15 | Disposition: A | Discharge status: Home / Self Care | Payer: Self-pay | Source: Ambulatory Visit | Attending: Internal Medicine | Admitting: Internal Medicine

## 2014-11-20 ENCOUNTER — Encounter (HOSPITAL_COMMUNITY)
Admission: RE | Admit: 2014-11-20 | Discharge: 2014-11-20 | Disposition: A | Discharge status: Home / Self Care | Payer: Self-pay | Source: Ambulatory Visit | Attending: Pulmonary Disease | Admitting: Pulmonary Disease

## 2014-11-22 ENCOUNTER — Encounter (HOSPITAL_COMMUNITY)
Admission: RE | Admit: 2014-11-22 | Discharge: 2014-11-22 | Disposition: A | Discharge status: Home / Self Care | Payer: Self-pay | Source: Ambulatory Visit | Attending: Pulmonary Disease | Admitting: Pulmonary Disease

## 2014-11-27 ENCOUNTER — Encounter (HOSPITAL_COMMUNITY)
Admission: RE | Admit: 2014-11-27 | Discharge: 2014-11-27 | Disposition: A | Discharge status: Home / Self Care | Payer: Self-pay | Source: Ambulatory Visit | Attending: Pulmonary Disease | Admitting: Pulmonary Disease

## 2014-11-29 ENCOUNTER — Encounter (HOSPITAL_COMMUNITY): Payer: Self-pay

## 2014-11-30 ENCOUNTER — Ambulatory Visit: Payer: BC Managed Care – PPO | Admitting: Pulmonary Disease

## 2014-12-04 ENCOUNTER — Encounter (HOSPITAL_COMMUNITY)
Admission: RE | Admit: 2014-12-04 | Discharge: 2014-12-04 | Disposition: A | Discharge status: Home / Self Care | Payer: Self-pay | Source: Ambulatory Visit | Attending: Pulmonary Disease | Admitting: Pulmonary Disease

## 2014-12-06 ENCOUNTER — Encounter (HOSPITAL_COMMUNITY)
Admission: RE | Admit: 2014-12-06 | Discharge: 2014-12-06 | Disposition: A | Discharge status: Home / Self Care | Payer: Self-pay | Source: Ambulatory Visit | Attending: Pulmonary Disease | Admitting: Pulmonary Disease

## 2014-12-11 ENCOUNTER — Encounter (HOSPITAL_COMMUNITY)
Admission: RE | Admit: 2014-12-11 | Discharge: 2014-12-11 | Disposition: A | Discharge status: Home / Self Care | Payer: Self-pay | Source: Ambulatory Visit | Attending: Internal Medicine | Admitting: Internal Medicine

## 2014-12-11 DIAGNOSIS — I10 Essential (primary) hypertension: Secondary | ICD-10-CM | POA: Insufficient documentation

## 2014-12-11 DIAGNOSIS — J439 Emphysema, unspecified: Secondary | ICD-10-CM | POA: Insufficient documentation

## 2014-12-11 DIAGNOSIS — J9611 Chronic respiratory failure with hypoxia: Secondary | ICD-10-CM | POA: Insufficient documentation

## 2014-12-13 ENCOUNTER — Encounter: Payer: Self-pay | Admitting: Pulmonary Disease

## 2014-12-13 ENCOUNTER — Encounter (HOSPITAL_COMMUNITY): Payer: Self-pay

## 2014-12-13 ENCOUNTER — Ambulatory Visit (INDEPENDENT_AMBULATORY_CARE_PROVIDER_SITE_OTHER): Payer: BLUE CROSS/BLUE SHIELD | Admitting: Pulmonary Disease

## 2014-12-13 VITALS — BP 178/72 | HR 73 | Temp 97.0°F | Ht 74.0 in | Wt 292.4 lb

## 2014-12-13 DIAGNOSIS — J438 Other emphysema: Secondary | ICD-10-CM

## 2014-12-13 NOTE — Progress Notes (Signed)
   Subjective:    Patient ID: Harold Hall, male    DOB: 1945/12/02, 69 y.o.   MRN: [id number]  HPI Patient comes in today for follow-up of his known severe COPD. He has been doing fairly well recently, with no acute exacerbation or pulmonary infection. He has been tried on daliresp, and sees an improvement in his symptoms. He is still participating in pulmonary rehabilitation, and is trying to work on weight loss. He has had a recent cardiac evaluation that showed diastolic dysfunction, and a low risk nuclear stress test.   Review of Systems  Constitutional: Negative for fever and unexpected weight change.  HENT: Positive for congestion and postnasal drip. Negative for dental problem, ear pain, nosebleeds, rhinorrhea, sinus pressure, sneezing, sore throat and trouble swallowing.   Eyes: Negative for redness and itching.  Respiratory: Positive for cough and shortness of breath. Negative for chest tightness and wheezing.   Cardiovascular: Positive for leg swelling. Negative for palpitations.  Gastrointestinal: Negative for nausea and vomiting.  Genitourinary: Negative for dysuria.  Musculoskeletal: Negative for joint swelling.  Skin: Negative for rash.  Neurological: Negative for headaches.  Hematological: Does not bruise/bleed easily.  Psychiatric/Behavioral: Negative for dysphoric mood. The patient is not nervous/anxious.        Objective:   Physical Exam Obese male in no acute distress Nose without purulence or discharge noted Neck without lymphadenopathy or thyromegaly Chest with mildly decreased breath sounds, but no wheezing and adequate airflow Cardiac exam with regular rate and rhythm Lower extremities with 1+ edema, no cyanosis Alert and oriented, moves all 4 extremities.       Assessment & Plan:

## 2014-12-13 NOTE — Assessment & Plan Note (Signed)
The pt appears to be stable from a pulmonary standpoint, and has seen a positive response to daliresp.  He still has ups and downs with his breathing as expected, but is at least working on an exercise program and weight loss.  He is really on a maximal pharmaceutical regimen at this point, and I stressed to him the importance of ongoing exercise and weight reduction. We can certainly consider a change to nebulized bronchodilators, but I do not think he is to that point as of yet. It should also be noted that he has had a recent echocardiogram that showed diastolic dysfunction with elevated left ventricular filling pressures and mild dilatation of the left atrial cavity. He also had a low risk nuclear stress test.

## 2014-12-13 NOTE — Patient Instructions (Signed)
No change in pulmonary medications. Be sure and wear your oxygen with exertional activities if it is going to be more prolonged Stay in rehab, and keep working on weight loss Followup with me again in 68mos.

## 2014-12-18 ENCOUNTER — Encounter (HOSPITAL_COMMUNITY)
Admission: RE | Admit: 2014-12-18 | Discharge: 2014-12-18 | Disposition: A | Discharge status: Home / Self Care | Payer: Self-pay | Source: Ambulatory Visit | Attending: Pulmonary Disease | Admitting: Pulmonary Disease

## 2014-12-20 ENCOUNTER — Encounter (HOSPITAL_COMMUNITY)
Admission: RE | Admit: 2014-12-20 | Discharge: 2014-12-20 | Disposition: A | Discharge status: Home / Self Care | Payer: Self-pay | Source: Ambulatory Visit | Attending: Pulmonary Disease | Admitting: Pulmonary Disease

## 2014-12-25 ENCOUNTER — Encounter (HOSPITAL_COMMUNITY): Payer: Self-pay

## 2014-12-27 ENCOUNTER — Encounter (HOSPITAL_COMMUNITY)
Admission: RE | Admit: 2014-12-27 | Discharge: 2014-12-27 | Disposition: A | Discharge status: Home / Self Care | Payer: Self-pay | Source: Ambulatory Visit | Attending: Pulmonary Disease | Admitting: Pulmonary Disease

## 2015-01-01 ENCOUNTER — Encounter (HOSPITAL_COMMUNITY)
Admission: RE | Admit: 2015-01-01 | Discharge: 2015-01-01 | Disposition: A | Discharge status: Home / Self Care | Payer: Self-pay | Source: Ambulatory Visit | Attending: Pulmonary Disease | Admitting: Pulmonary Disease

## 2015-01-03 ENCOUNTER — Encounter (HOSPITAL_COMMUNITY)
Admission: RE | Admit: 2015-01-03 | Discharge: 2015-01-03 | Disposition: A | Discharge status: Home / Self Care | Payer: Self-pay | Source: Ambulatory Visit | Attending: Pulmonary Disease | Admitting: Pulmonary Disease

## 2015-01-08 ENCOUNTER — Encounter (HOSPITAL_COMMUNITY): Payer: BLUE CROSS/BLUE SHIELD

## 2015-01-08 DIAGNOSIS — J439 Emphysema, unspecified: Secondary | ICD-10-CM | POA: Insufficient documentation

## 2015-01-08 DIAGNOSIS — J9611 Chronic respiratory failure with hypoxia: Secondary | ICD-10-CM | POA: Insufficient documentation

## 2015-01-08 DIAGNOSIS — I10 Essential (primary) hypertension: Secondary | ICD-10-CM | POA: Insufficient documentation

## 2015-01-10 ENCOUNTER — Encounter (HOSPITAL_COMMUNITY)
Admission: RE | Admit: 2015-01-10 | Discharge: 2015-01-10 | Disposition: A | Discharge status: Home / Self Care | Payer: Self-pay | Source: Ambulatory Visit | Attending: Internal Medicine | Admitting: Internal Medicine

## 2015-01-15 ENCOUNTER — Encounter (HOSPITAL_COMMUNITY)
Admission: RE | Admit: 2015-01-15 | Discharge: 2015-01-15 | Disposition: A | Discharge status: Home / Self Care | Payer: Self-pay | Source: Ambulatory Visit | Attending: Pulmonary Disease | Admitting: Pulmonary Disease

## 2015-01-17 ENCOUNTER — Encounter (HOSPITAL_COMMUNITY)
Admission: RE | Admit: 2015-01-17 | Discharge: 2015-01-17 | Disposition: A | Discharge status: Home / Self Care | Payer: Self-pay | Source: Ambulatory Visit | Attending: Pulmonary Disease | Admitting: Pulmonary Disease

## 2015-01-22 ENCOUNTER — Encounter (HOSPITAL_COMMUNITY)
Admission: RE | Admit: 2015-01-22 | Discharge: 2015-01-22 | Disposition: A | Discharge status: Home / Self Care | Payer: Self-pay | Source: Ambulatory Visit | Attending: Pulmonary Disease | Admitting: Pulmonary Disease

## 2015-01-24 ENCOUNTER — Encounter (HOSPITAL_COMMUNITY)
Admission: RE | Admit: 2015-01-24 | Discharge: 2015-01-24 | Disposition: A | Discharge status: Home / Self Care | Payer: Self-pay | Source: Ambulatory Visit | Attending: Pulmonary Disease | Admitting: Pulmonary Disease

## 2015-01-29 ENCOUNTER — Encounter (HOSPITAL_COMMUNITY)
Admission: RE | Admit: 2015-01-29 | Discharge: 2015-01-29 | Disposition: A | Discharge status: Home / Self Care | Payer: Self-pay | Source: Ambulatory Visit | Attending: Pulmonary Disease | Admitting: Pulmonary Disease

## 2015-01-31 ENCOUNTER — Encounter (HOSPITAL_COMMUNITY)
Admission: RE | Admit: 2015-01-31 | Discharge: 2015-01-31 | Disposition: A | Discharge status: Home / Self Care | Payer: Self-pay | Source: Ambulatory Visit | Attending: Pulmonary Disease | Admitting: Pulmonary Disease

## 2015-02-05 ENCOUNTER — Encounter (HOSPITAL_COMMUNITY)
Admission: RE | Admit: 2015-02-05 | Discharge: 2015-02-05 | Disposition: A | Discharge status: Home / Self Care | Payer: Self-pay | Source: Ambulatory Visit | Attending: Pulmonary Disease | Admitting: Pulmonary Disease

## 2015-02-07 ENCOUNTER — Encounter (HOSPITAL_COMMUNITY)
Admission: RE | Admit: 2015-02-07 | Discharge: 2015-02-07 | Disposition: A | Discharge status: Home / Self Care | Payer: Self-pay | Source: Ambulatory Visit | Attending: Pulmonary Disease | Admitting: Pulmonary Disease

## 2015-02-12 ENCOUNTER — Encounter (HOSPITAL_COMMUNITY)
Admission: RE | Admit: 2015-02-12 | Discharge: 2015-02-12 | Disposition: A | Discharge status: Home / Self Care | Payer: Self-pay | Source: Ambulatory Visit | Attending: Internal Medicine | Admitting: Internal Medicine

## 2015-02-12 DIAGNOSIS — J9611 Chronic respiratory failure with hypoxia: Secondary | ICD-10-CM | POA: Insufficient documentation

## 2015-02-12 DIAGNOSIS — I10 Essential (primary) hypertension: Secondary | ICD-10-CM | POA: Insufficient documentation

## 2015-02-12 DIAGNOSIS — J439 Emphysema, unspecified: Secondary | ICD-10-CM | POA: Insufficient documentation

## 2015-02-14 ENCOUNTER — Encounter (HOSPITAL_COMMUNITY)
Admission: RE | Admit: 2015-02-14 | Discharge: 2015-02-14 | Disposition: A | Discharge status: Home / Self Care | Payer: Self-pay | Source: Ambulatory Visit | Attending: Pulmonary Disease | Admitting: Pulmonary Disease

## 2015-02-19 ENCOUNTER — Encounter (HOSPITAL_COMMUNITY)
Admission: RE | Admit: 2015-02-19 | Discharge: 2015-02-19 | Disposition: A | Discharge status: Home / Self Care | Payer: Self-pay | Source: Ambulatory Visit | Attending: Pulmonary Disease | Admitting: Pulmonary Disease

## 2015-02-21 ENCOUNTER — Encounter (HOSPITAL_COMMUNITY)
Admission: RE | Admit: 2015-02-21 | Discharge: 2015-02-21 | Disposition: A | Discharge status: Home / Self Care | Payer: Self-pay | Source: Ambulatory Visit | Attending: Pulmonary Disease | Admitting: Pulmonary Disease

## 2015-02-26 ENCOUNTER — Encounter (HOSPITAL_COMMUNITY): Payer: Self-pay

## 2015-02-28 ENCOUNTER — Encounter (HOSPITAL_COMMUNITY)
Admission: RE | Admit: 2015-02-28 | Discharge: 2015-02-28 | Disposition: A | Discharge status: Home / Self Care | Payer: Self-pay | Source: Ambulatory Visit | Attending: Pulmonary Disease | Admitting: Pulmonary Disease

## 2015-03-05 ENCOUNTER — Encounter (HOSPITAL_COMMUNITY)
Admission: RE | Admit: 2015-03-05 | Discharge: 2015-03-05 | Disposition: A | Discharge status: Home / Self Care | Payer: Self-pay | Source: Ambulatory Visit | Attending: Pulmonary Disease | Admitting: Pulmonary Disease

## 2015-03-07 ENCOUNTER — Encounter (HOSPITAL_COMMUNITY)
Admission: RE | Admit: 2015-03-07 | Discharge: 2015-03-07 | Disposition: A | Discharge status: Home / Self Care | Payer: Self-pay | Source: Ambulatory Visit | Attending: Pulmonary Disease | Admitting: Pulmonary Disease

## 2015-03-12 ENCOUNTER — Encounter (HOSPITAL_COMMUNITY)
Admission: RE | Admit: 2015-03-12 | Discharge: 2015-03-12 | Disposition: A | Discharge status: Home / Self Care | Payer: Self-pay | Source: Ambulatory Visit | Attending: Internal Medicine | Admitting: Internal Medicine

## 2015-03-12 DIAGNOSIS — J9611 Chronic respiratory failure with hypoxia: Secondary | ICD-10-CM | POA: Insufficient documentation

## 2015-03-12 DIAGNOSIS — J439 Emphysema, unspecified: Secondary | ICD-10-CM | POA: Insufficient documentation

## 2015-03-12 DIAGNOSIS — I10 Essential (primary) hypertension: Secondary | ICD-10-CM | POA: Insufficient documentation

## 2015-03-14 ENCOUNTER — Encounter (HOSPITAL_COMMUNITY)
Admission: RE | Admit: 2015-03-14 | Discharge: 2015-03-14 | Disposition: A | Discharge status: Home / Self Care | Payer: Self-pay | Source: Ambulatory Visit | Attending: Internal Medicine | Admitting: Internal Medicine

## 2015-03-19 ENCOUNTER — Encounter (HOSPITAL_COMMUNITY): Payer: Self-pay

## 2015-03-21 ENCOUNTER — Encounter (HOSPITAL_COMMUNITY): Payer: Self-pay

## 2015-03-26 ENCOUNTER — Encounter (HOSPITAL_COMMUNITY)
Admission: RE | Admit: 2015-03-26 | Discharge: 2015-03-26 | Disposition: A | Discharge status: Home / Self Care | Payer: Self-pay | Source: Ambulatory Visit | Attending: Internal Medicine | Admitting: Internal Medicine

## 2015-03-28 ENCOUNTER — Encounter (HOSPITAL_COMMUNITY)
Admission: RE | Admit: 2015-03-28 | Discharge: 2015-03-28 | Disposition: A | Discharge status: Home / Self Care | Payer: Self-pay | Source: Ambulatory Visit | Attending: Internal Medicine | Admitting: Internal Medicine

## 2015-04-02 ENCOUNTER — Encounter (HOSPITAL_COMMUNITY)
Admission: RE | Admit: 2015-04-02 | Discharge: 2015-04-02 | Disposition: A | Discharge status: Home / Self Care | Payer: Self-pay | Source: Ambulatory Visit | Attending: Internal Medicine | Admitting: Internal Medicine

## 2015-04-04 ENCOUNTER — Encounter (HOSPITAL_COMMUNITY)
Admission: RE | Admit: 2015-04-04 | Discharge: 2015-04-04 | Disposition: A | Discharge status: Home / Self Care | Payer: Self-pay | Source: Ambulatory Visit | Attending: Internal Medicine | Admitting: Internal Medicine

## 2015-04-09 ENCOUNTER — Encounter (HOSPITAL_COMMUNITY): Payer: Self-pay

## 2015-04-11 ENCOUNTER — Encounter (HOSPITAL_COMMUNITY): Payer: BLUE CROSS/BLUE SHIELD

## 2015-04-11 DIAGNOSIS — J9611 Chronic respiratory failure with hypoxia: Secondary | ICD-10-CM | POA: Insufficient documentation

## 2015-04-11 DIAGNOSIS — I10 Essential (primary) hypertension: Secondary | ICD-10-CM | POA: Insufficient documentation

## 2015-04-11 DIAGNOSIS — J439 Emphysema, unspecified: Secondary | ICD-10-CM | POA: Insufficient documentation

## 2015-04-15 ENCOUNTER — Ambulatory Visit (INDEPENDENT_AMBULATORY_CARE_PROVIDER_SITE_OTHER): Payer: BLUE CROSS/BLUE SHIELD | Admitting: Pulmonary Disease

## 2015-04-15 ENCOUNTER — Encounter: Payer: Self-pay | Admitting: Pulmonary Disease

## 2015-04-15 VITALS — BP 132/60 | HR 62 | Temp 98.0°F | Ht 74.0 in | Wt 290.6 lb

## 2015-04-15 DIAGNOSIS — J438 Other emphysema: Secondary | ICD-10-CM | POA: Diagnosis not present

## 2015-04-15 DIAGNOSIS — J9611 Chronic respiratory failure with hypoxia: Secondary | ICD-10-CM | POA: Diagnosis not present

## 2015-04-15 NOTE — Patient Instructions (Signed)
No change in breathing medications Continue to work on weight loss followup with Dr. Lake Bells in 4-20mos

## 2015-04-15 NOTE — Assessment & Plan Note (Signed)
The patient is doing very well from a COPD standpoint on his current medication. He is working in pulmonary rehabilitation, and is staying on his bronchodilator. He has not had frequent exacerbations since daliresp has been added. I've asked him to continue on his current medications, and to work hard in pulmonary rehabilitation, and to work aggressively on weight loss.

## 2015-04-15 NOTE — Progress Notes (Signed)
   Subjective:    Patient ID: Harold Hall, male    DOB: 07-22-1946, 69 y.o.   MRN: [id number]  HPI Patient comes in today for follow-up of his known COPD with chronic respiratory failure. He is staying on his oxygen with exertion, as well as a very good bronchodilator regimen. He feels that his exertional tolerance is at baseline, and is participating in pulmonary rehabilitation. He has had no significant chest congestion or purulence, and no acute exacerbation since the last visit.   Review of Systems  Constitutional: Negative for fever and unexpected weight change.  HENT: Negative for congestion, dental problem, ear pain, nosebleeds, postnasal drip, rhinorrhea, sinus pressure, sneezing, sore throat and trouble swallowing.   Eyes: Negative for redness and itching.  Respiratory: Positive for shortness of breath. Negative for cough, chest tightness and wheezing.   Cardiovascular: Negative for palpitations and leg swelling.  Gastrointestinal: Negative for nausea and vomiting.  Genitourinary: Negative for dysuria.  Musculoskeletal: Negative for joint swelling.  Skin: Negative for rash.  Neurological: Negative for headaches.  Hematological: Does not bruise/bleed easily.  Psychiatric/Behavioral: Negative for dysphoric mood. The patient is not nervous/anxious.        Objective:   Physical Exam Overweight male in no acute distress Nose without purulence or discharge noted Neck without lymphadenopathy or thyromegaly Chest with mildly decreased breath sounds, but adequate airflow and no wheezing. Cardiac exam with regular rate and rhythm Lower extremities with minimal edema, no cyanosis Alert and oriented, moves all 4 extremities.       Assessment & Plan:

## 2015-04-16 ENCOUNTER — Encounter (HOSPITAL_COMMUNITY)
Admission: RE | Admit: 2015-04-16 | Discharge: 2015-04-16 | Disposition: A | Discharge status: Home / Self Care | Payer: Self-pay | Source: Ambulatory Visit | Attending: Internal Medicine | Admitting: Internal Medicine

## 2015-04-18 ENCOUNTER — Encounter (HOSPITAL_COMMUNITY)
Admission: RE | Admit: 2015-04-18 | Discharge: 2015-04-18 | Disposition: A | Discharge status: Home / Self Care | Payer: Self-pay | Source: Ambulatory Visit | Attending: Internal Medicine | Admitting: Internal Medicine

## 2015-04-23 ENCOUNTER — Encounter (HOSPITAL_COMMUNITY)
Admission: RE | Admit: 2015-04-23 | Discharge: 2015-04-23 | Disposition: A | Discharge status: Home / Self Care | Payer: Self-pay | Source: Ambulatory Visit | Attending: Internal Medicine | Admitting: Internal Medicine

## 2015-04-25 ENCOUNTER — Encounter (HOSPITAL_COMMUNITY)
Admission: RE | Admit: 2015-04-25 | Discharge: 2015-04-25 | Disposition: A | Discharge status: Home / Self Care | Payer: Self-pay | Source: Ambulatory Visit | Attending: Internal Medicine | Admitting: Internal Medicine

## 2015-04-30 ENCOUNTER — Encounter (HOSPITAL_COMMUNITY): Payer: Self-pay

## 2015-05-02 ENCOUNTER — Encounter (HOSPITAL_COMMUNITY): Payer: Self-pay

## 2015-05-07 ENCOUNTER — Encounter (HOSPITAL_COMMUNITY)
Admission: RE | Admit: 2015-05-07 | Discharge: 2015-05-07 | Disposition: A | Discharge status: Home / Self Care | Payer: Self-pay | Source: Ambulatory Visit | Attending: Internal Medicine | Admitting: Internal Medicine

## 2015-05-09 ENCOUNTER — Encounter (HOSPITAL_COMMUNITY)
Admission: RE | Admit: 2015-05-09 | Discharge: 2015-05-09 | Disposition: A | Discharge status: Home / Self Care | Payer: Self-pay | Source: Ambulatory Visit | Attending: Internal Medicine | Admitting: Internal Medicine

## 2015-05-14 ENCOUNTER — Encounter (HOSPITAL_COMMUNITY)
Admission: RE | Admit: 2015-05-14 | Discharge: 2015-05-14 | Disposition: A | Discharge status: Home / Self Care | Payer: Self-pay | Source: Ambulatory Visit | Attending: Internal Medicine | Admitting: Internal Medicine

## 2015-05-14 DIAGNOSIS — J439 Emphysema, unspecified: Secondary | ICD-10-CM | POA: Insufficient documentation

## 2015-05-14 DIAGNOSIS — J9611 Chronic respiratory failure with hypoxia: Secondary | ICD-10-CM | POA: Insufficient documentation

## 2015-05-14 DIAGNOSIS — I10 Essential (primary) hypertension: Secondary | ICD-10-CM | POA: Insufficient documentation

## 2015-05-16 ENCOUNTER — Encounter (HOSPITAL_COMMUNITY)
Admission: RE | Admit: 2015-05-16 | Discharge: 2015-05-16 | Disposition: A | Discharge status: Home / Self Care | Payer: Self-pay | Source: Ambulatory Visit | Attending: Internal Medicine | Admitting: Internal Medicine

## 2015-05-21 ENCOUNTER — Encounter (HOSPITAL_COMMUNITY)
Admission: RE | Admit: 2015-05-21 | Discharge: 2015-05-21 | Disposition: A | Discharge status: Home / Self Care | Payer: Self-pay | Source: Ambulatory Visit | Attending: Internal Medicine | Admitting: Internal Medicine

## 2015-05-23 ENCOUNTER — Encounter (HOSPITAL_COMMUNITY)
Admission: RE | Admit: 2015-05-23 | Discharge: 2015-05-23 | Disposition: A | Discharge status: Home / Self Care | Payer: Self-pay | Source: Ambulatory Visit | Attending: Internal Medicine | Admitting: Internal Medicine

## 2015-05-28 ENCOUNTER — Encounter (HOSPITAL_COMMUNITY): Payer: Self-pay

## 2015-05-30 ENCOUNTER — Encounter (HOSPITAL_COMMUNITY)
Admission: RE | Admit: 2015-05-30 | Discharge: 2015-05-30 | Disposition: A | Discharge status: Home / Self Care | Payer: Self-pay | Source: Ambulatory Visit | Attending: Internal Medicine | Admitting: Internal Medicine

## 2015-06-04 ENCOUNTER — Encounter (HOSPITAL_COMMUNITY)
Admission: RE | Admit: 2015-06-04 | Discharge: 2015-06-04 | Disposition: A | Discharge status: Home / Self Care | Payer: Self-pay | Source: Ambulatory Visit | Attending: Internal Medicine | Admitting: Internal Medicine

## 2015-06-06 ENCOUNTER — Encounter (HOSPITAL_COMMUNITY)
Admission: RE | Admit: 2015-06-06 | Discharge: 2015-06-06 | Disposition: A | Discharge status: Home / Self Care | Payer: Self-pay | Source: Ambulatory Visit | Attending: Internal Medicine | Admitting: Internal Medicine

## 2015-06-11 ENCOUNTER — Encounter (HOSPITAL_COMMUNITY)
Admission: RE | Admit: 2015-06-11 | Discharge: 2015-06-11 | Disposition: A | Discharge status: Home / Self Care | Payer: Self-pay | Source: Ambulatory Visit | Attending: Internal Medicine | Admitting: Internal Medicine

## 2015-06-11 DIAGNOSIS — J9611 Chronic respiratory failure with hypoxia: Secondary | ICD-10-CM | POA: Insufficient documentation

## 2015-06-11 DIAGNOSIS — J439 Emphysema, unspecified: Secondary | ICD-10-CM | POA: Insufficient documentation

## 2015-06-11 DIAGNOSIS — I10 Essential (primary) hypertension: Secondary | ICD-10-CM | POA: Insufficient documentation

## 2015-06-13 ENCOUNTER — Encounter (HOSPITAL_COMMUNITY)
Admission: RE | Admit: 2015-06-13 | Discharge: 2015-06-13 | Disposition: A | Discharge status: Home / Self Care | Payer: Self-pay | Source: Ambulatory Visit | Attending: Internal Medicine | Admitting: Internal Medicine

## 2015-06-18 ENCOUNTER — Encounter (HOSPITAL_COMMUNITY)
Admission: RE | Admit: 2015-06-18 | Discharge: 2015-06-18 | Disposition: A | Discharge status: Home / Self Care | Payer: Self-pay | Source: Ambulatory Visit | Attending: Internal Medicine | Admitting: Internal Medicine

## 2015-06-20 ENCOUNTER — Encounter (HOSPITAL_COMMUNITY)
Admission: RE | Admit: 2015-06-20 | Discharge: 2015-06-20 | Disposition: A | Discharge status: Home / Self Care | Payer: Self-pay | Source: Ambulatory Visit | Attending: Internal Medicine | Admitting: Internal Medicine

## 2015-06-25 ENCOUNTER — Encounter (HOSPITAL_COMMUNITY)
Admission: RE | Admit: 2015-06-25 | Discharge: 2015-06-25 | Disposition: A | Discharge status: Home / Self Care | Payer: Self-pay | Source: Ambulatory Visit | Attending: Internal Medicine | Admitting: Internal Medicine

## 2015-06-27 ENCOUNTER — Encounter (HOSPITAL_COMMUNITY)
Admission: RE | Admit: 2015-06-27 | Discharge: 2015-06-27 | Disposition: A | Discharge status: Home / Self Care | Payer: Self-pay | Source: Ambulatory Visit | Attending: Internal Medicine | Admitting: Internal Medicine

## 2015-07-02 ENCOUNTER — Encounter (HOSPITAL_COMMUNITY)
Admission: RE | Admit: 2015-07-02 | Discharge: 2015-07-02 | Disposition: A | Discharge status: Home / Self Care | Payer: Self-pay | Source: Ambulatory Visit | Attending: Internal Medicine | Admitting: Internal Medicine

## 2015-07-04 ENCOUNTER — Encounter (HOSPITAL_COMMUNITY)
Admission: RE | Admit: 2015-07-04 | Discharge: 2015-07-04 | Disposition: A | Discharge status: Home / Self Care | Payer: Self-pay | Source: Ambulatory Visit | Attending: Internal Medicine | Admitting: Internal Medicine

## 2015-07-09 ENCOUNTER — Encounter (HOSPITAL_COMMUNITY)
Admission: RE | Admit: 2015-07-09 | Discharge: 2015-07-09 | Disposition: A | Discharge status: Home / Self Care | Payer: Self-pay | Source: Ambulatory Visit | Attending: Internal Medicine | Admitting: Internal Medicine

## 2015-07-11 ENCOUNTER — Encounter (HOSPITAL_COMMUNITY)
Admission: RE | Admit: 2015-07-11 | Discharge: 2015-07-11 | Disposition: A | Discharge status: Home / Self Care | Payer: Self-pay | Source: Ambulatory Visit | Attending: Internal Medicine | Admitting: Internal Medicine

## 2015-07-11 DIAGNOSIS — J9611 Chronic respiratory failure with hypoxia: Secondary | ICD-10-CM | POA: Insufficient documentation

## 2015-07-11 DIAGNOSIS — J439 Emphysema, unspecified: Secondary | ICD-10-CM | POA: Insufficient documentation

## 2015-07-11 DIAGNOSIS — I10 Essential (primary) hypertension: Secondary | ICD-10-CM | POA: Insufficient documentation

## 2015-07-16 ENCOUNTER — Encounter (HOSPITAL_COMMUNITY)
Admission: RE | Admit: 2015-07-16 | Discharge: 2015-07-16 | Disposition: A | Discharge status: Home / Self Care | Payer: Self-pay | Source: Ambulatory Visit | Attending: Internal Medicine | Admitting: Internal Medicine

## 2015-07-18 ENCOUNTER — Encounter (HOSPITAL_COMMUNITY)
Admission: RE | Admit: 2015-07-18 | Discharge: 2015-07-18 | Disposition: A | Discharge status: Home / Self Care | Payer: Self-pay | Source: Ambulatory Visit | Attending: Internal Medicine | Admitting: Internal Medicine

## 2015-07-23 ENCOUNTER — Encounter (HOSPITAL_COMMUNITY)
Admission: RE | Admit: 2015-07-23 | Discharge: 2015-07-23 | Disposition: A | Discharge status: Home / Self Care | Payer: Self-pay | Source: Ambulatory Visit | Attending: Internal Medicine | Admitting: Internal Medicine

## 2015-07-25 ENCOUNTER — Encounter (HOSPITAL_COMMUNITY)
Admission: RE | Admit: 2015-07-25 | Discharge: 2015-07-25 | Disposition: A | Discharge status: Home / Self Care | Payer: Self-pay | Source: Ambulatory Visit | Attending: Internal Medicine | Admitting: Internal Medicine

## 2015-07-30 ENCOUNTER — Encounter (HOSPITAL_COMMUNITY)
Admission: RE | Admit: 2015-07-30 | Discharge: 2015-07-30 | Disposition: A | Discharge status: Home / Self Care | Payer: Self-pay | Source: Ambulatory Visit | Attending: Internal Medicine | Admitting: Internal Medicine

## 2015-08-01 ENCOUNTER — Encounter (HOSPITAL_COMMUNITY)
Admission: RE | Admit: 2015-08-01 | Discharge: 2015-08-01 | Disposition: A | Discharge status: Home / Self Care | Payer: Self-pay | Source: Ambulatory Visit | Attending: Internal Medicine | Admitting: Internal Medicine

## 2015-08-06 ENCOUNTER — Encounter (HOSPITAL_COMMUNITY)
Admission: RE | Admit: 2015-08-06 | Discharge: 2015-08-06 | Disposition: A | Discharge status: Home / Self Care | Payer: Self-pay | Source: Ambulatory Visit | Attending: Internal Medicine | Admitting: Internal Medicine

## 2015-08-08 ENCOUNTER — Encounter (HOSPITAL_COMMUNITY)
Admission: RE | Admit: 2015-08-08 | Discharge: 2015-08-08 | Disposition: A | Discharge status: Home / Self Care | Payer: Self-pay | Source: Ambulatory Visit | Attending: Internal Medicine | Admitting: Internal Medicine

## 2015-08-13 ENCOUNTER — Encounter (HOSPITAL_COMMUNITY)
Admission: RE | Admit: 2015-08-13 | Discharge: 2015-08-13 | Disposition: A | Discharge status: Home / Self Care | Payer: Self-pay | Source: Ambulatory Visit | Attending: Internal Medicine | Admitting: Internal Medicine

## 2015-08-13 DIAGNOSIS — J439 Emphysema, unspecified: Secondary | ICD-10-CM | POA: Insufficient documentation

## 2015-08-13 DIAGNOSIS — I10 Essential (primary) hypertension: Secondary | ICD-10-CM | POA: Insufficient documentation

## 2015-08-13 DIAGNOSIS — J9611 Chronic respiratory failure with hypoxia: Secondary | ICD-10-CM | POA: Insufficient documentation

## 2015-08-15 ENCOUNTER — Encounter (HOSPITAL_COMMUNITY): Payer: Self-pay

## 2015-08-20 ENCOUNTER — Encounter (HOSPITAL_COMMUNITY): Payer: Self-pay

## 2015-08-22 ENCOUNTER — Encounter (HOSPITAL_COMMUNITY): Payer: Self-pay

## 2015-08-27 ENCOUNTER — Encounter (HOSPITAL_COMMUNITY)
Admission: RE | Admit: 2015-08-27 | Discharge: 2015-08-27 | Disposition: A | Discharge status: Home / Self Care | Payer: Self-pay | Source: Ambulatory Visit | Attending: Internal Medicine | Admitting: Internal Medicine

## 2015-08-29 ENCOUNTER — Encounter (HOSPITAL_COMMUNITY)
Admission: RE | Admit: 2015-08-29 | Discharge: 2015-08-29 | Disposition: A | Discharge status: Home / Self Care | Payer: Self-pay | Source: Ambulatory Visit | Attending: Internal Medicine | Admitting: Internal Medicine

## 2015-09-02 ENCOUNTER — Telehealth: Payer: Self-pay | Admitting: Pulmonary Disease

## 2015-09-02 MED ORDER — TIOTROPIUM BROMIDE MONOHYDRATE 18 MCG IN CAPS: ORAL_CAPSULE | RESPIRATORY_TRACT | Status: DC

## 2015-09-02 MED ORDER — BUDESONIDE-FORMOTEROL FUMARATE 160-4.5 MCG/ACT IN AERO: INHALATION_SPRAY | RESPIRATORY_TRACT | Status: DC

## 2015-09-02 NOTE — Telephone Encounter (Signed)
Spoke with pt, states that he needs a refill on symbicort and spiriva to [facility].  Pt is a former [facility] pt scheduled to follow up with BQ.  rx's sent under BQ's name.  Nothing further needed.

## 2015-09-03 ENCOUNTER — Encounter (HOSPITAL_COMMUNITY)
Admission: RE | Admit: 2015-09-03 | Discharge: 2015-09-03 | Disposition: A | Discharge status: Home / Self Care | Payer: Self-pay | Source: Ambulatory Visit | Attending: Internal Medicine | Admitting: Internal Medicine

## 2015-09-03 ENCOUNTER — Other Ambulatory Visit: Payer: Self-pay | Admitting: *Deleted

## 2015-09-10 ENCOUNTER — Encounter (HOSPITAL_COMMUNITY)
Admission: RE | Admit: 2015-09-10 | Discharge: 2015-09-10 | Disposition: A | Discharge status: Home / Self Care | Payer: Self-pay | Source: Ambulatory Visit | Attending: Internal Medicine | Admitting: Internal Medicine

## 2015-09-10 DIAGNOSIS — I10 Essential (primary) hypertension: Secondary | ICD-10-CM | POA: Insufficient documentation

## 2015-09-10 DIAGNOSIS — J9611 Chronic respiratory failure with hypoxia: Secondary | ICD-10-CM | POA: Insufficient documentation

## 2015-09-10 DIAGNOSIS — J439 Emphysema, unspecified: Secondary | ICD-10-CM | POA: Insufficient documentation

## 2015-09-12 ENCOUNTER — Encounter (HOSPITAL_COMMUNITY)
Admission: RE | Admit: 2015-09-12 | Discharge: 2015-09-12 | Disposition: A | Discharge status: Home / Self Care | Payer: Self-pay | Source: Ambulatory Visit | Attending: Internal Medicine | Admitting: Internal Medicine

## 2015-09-17 ENCOUNTER — Encounter (HOSPITAL_COMMUNITY)
Admission: RE | Admit: 2015-09-17 | Discharge: 2015-09-17 | Disposition: A | Discharge status: Home / Self Care | Payer: Self-pay | Source: Ambulatory Visit | Attending: Internal Medicine | Admitting: Internal Medicine

## 2015-09-19 ENCOUNTER — Encounter (HOSPITAL_COMMUNITY)
Admission: RE | Admit: 2015-09-19 | Discharge: 2015-09-19 | Disposition: A | Discharge status: Home / Self Care | Payer: Self-pay | Source: Ambulatory Visit | Attending: Internal Medicine | Admitting: Internal Medicine

## 2015-09-24 ENCOUNTER — Encounter (HOSPITAL_COMMUNITY)
Admission: RE | Admit: 2015-09-24 | Discharge: 2015-09-24 | Disposition: A | Discharge status: Home / Self Care | Payer: Self-pay | Source: Ambulatory Visit | Attending: Internal Medicine | Admitting: Internal Medicine

## 2015-09-26 ENCOUNTER — Encounter (HOSPITAL_COMMUNITY)
Admission: RE | Admit: 2015-09-26 | Discharge: 2015-09-26 | Disposition: A | Discharge status: Home / Self Care | Payer: Self-pay | Source: Ambulatory Visit | Attending: Internal Medicine | Admitting: Internal Medicine

## 2015-10-01 ENCOUNTER — Encounter (HOSPITAL_COMMUNITY)
Admission: RE | Admit: 2015-10-01 | Discharge: 2015-10-01 | Disposition: A | Discharge status: Home / Self Care | Payer: Self-pay | Source: Ambulatory Visit | Attending: Internal Medicine | Admitting: Internal Medicine

## 2015-10-03 ENCOUNTER — Encounter (HOSPITAL_COMMUNITY): Payer: Self-pay

## 2015-10-08 ENCOUNTER — Encounter (HOSPITAL_COMMUNITY)
Admission: RE | Admit: 2015-10-08 | Discharge: 2015-10-08 | Disposition: A | Discharge status: Home / Self Care | Payer: Self-pay | Source: Ambulatory Visit | Attending: Internal Medicine | Admitting: Internal Medicine

## 2015-10-10 ENCOUNTER — Encounter (HOSPITAL_COMMUNITY)
Admission: RE | Admit: 2015-10-10 | Discharge: 2015-10-10 | Disposition: A | Discharge status: Home / Self Care | Payer: Self-pay | Source: Ambulatory Visit | Attending: Internal Medicine | Admitting: Internal Medicine

## 2015-10-10 DIAGNOSIS — J9611 Chronic respiratory failure with hypoxia: Secondary | ICD-10-CM | POA: Insufficient documentation

## 2015-10-10 DIAGNOSIS — J439 Emphysema, unspecified: Secondary | ICD-10-CM | POA: Insufficient documentation

## 2015-10-10 DIAGNOSIS — I10 Essential (primary) hypertension: Secondary | ICD-10-CM | POA: Insufficient documentation

## 2015-10-15 ENCOUNTER — Encounter (HOSPITAL_COMMUNITY)
Admission: RE | Admit: 2015-10-15 | Discharge: 2015-10-15 | Disposition: A | Discharge status: Home / Self Care | Payer: Self-pay | Source: Ambulatory Visit | Attending: Internal Medicine | Admitting: Internal Medicine

## 2015-10-17 ENCOUNTER — Encounter (HOSPITAL_COMMUNITY)
Admission: RE | Admit: 2015-10-17 | Discharge: 2015-10-17 | Disposition: A | Discharge status: Home / Self Care | Payer: Self-pay | Source: Ambulatory Visit | Attending: Internal Medicine | Admitting: Internal Medicine

## 2015-10-21 ENCOUNTER — Other Ambulatory Visit: Payer: Self-pay | Admitting: *Deleted

## 2015-10-21 MED ORDER — ROFLUMILAST 500 MCG PO TABS: 500.0000 ug | ORAL_TABLET | Freq: Every day | ORAL | Status: DC

## 2015-10-22 ENCOUNTER — Encounter (HOSPITAL_COMMUNITY)
Admission: RE | Admit: 2015-10-22 | Discharge: 2015-10-22 | Disposition: A | Discharge status: Home / Self Care | Payer: Self-pay | Source: Ambulatory Visit | Attending: Internal Medicine | Admitting: Internal Medicine

## 2015-10-24 ENCOUNTER — Encounter (HOSPITAL_COMMUNITY)
Admission: RE | Admit: 2015-10-24 | Discharge: 2015-10-24 | Disposition: A | Discharge status: Home / Self Care | Payer: Self-pay | Source: Ambulatory Visit | Attending: Internal Medicine | Admitting: Internal Medicine

## 2015-10-24 ENCOUNTER — Encounter: Payer: Self-pay | Admitting: Pulmonary Disease

## 2015-10-24 ENCOUNTER — Ambulatory Visit (INDEPENDENT_AMBULATORY_CARE_PROVIDER_SITE_OTHER): Payer: BLUE CROSS/BLUE SHIELD | Admitting: Pulmonary Disease

## 2015-10-24 VITALS — BP 132/68 | HR 82 | Ht 74.0 in | Wt 294.0 lb

## 2015-10-24 DIAGNOSIS — J438 Other emphysema: Secondary | ICD-10-CM

## 2015-10-24 DIAGNOSIS — J441 Chronic obstructive pulmonary disease with (acute) exacerbation: Secondary | ICD-10-CM

## 2015-10-24 DIAGNOSIS — J9611 Chronic respiratory failure with hypoxia: Secondary | ICD-10-CM

## 2015-10-24 NOTE — Assessment & Plan Note (Signed)
Stable interval without any deterioration in the last several years. Hopefully this means there is not fibrosis despite the chest x-ray findings.  3L with exertion, 4-5 with heavy exertoin

## 2015-10-24 NOTE — Progress Notes (Signed)
Subjective:    Patient ID: Harold Hall, male    DOB: 09/15/1946, 69 y.o.   MRN: [id number]  Synopsis: Former patient of Dr. Gwenette Greet with COPD. Dr. Gwenette Greet summarized his situation as follows: Spiro 2007:  FEV1 2.10 (46%), ratio 47 Stopped smoking 05/2011 Pulm rehab 2012, 2016 On exertional oxygen with portable concentrator. +response to daliresp.  He walks with 3L with exertion, 4-5 with heavy exercise Participating in pulmonary rehab in 2016  HPI Chief Complaint  Patient presents with  . Follow-up    former [facility] pt being treated for emphysema.  Pt c/o sinus congestion, PND.  states this is normal for him this time of year.     He has been doing well.  He has some sinus congestion this time of year typically.  No recent flare up s of his COPD.  He has some throat mucus from time.   No bronchitis lately.  He has not had any flare ups since he started taking Daliresp, prior to that he had a lot of problems. He recalls that his disease started around 1991 when he was being treated for bronchitis at least 2 times per year. This persisted until 3 years ago and he has not had a flare up since starting [place].    He continues to participate in pulmonary rehab.  He uses his oxygen regularly   Past Medical History  Diagnosis Date  . Allergic rhinitis   . Hypertension   . RLS (restless legs syndrome)   . COPD (chronic obstructive pulmonary disease) ([place])   . Gout   . History of kidney cancer   . Arthritis   . Hernia   . Sinus problem   . Erectile dysfunction   . Hypercholesterolemia   . Cancer [facility])     left renal -solitary kidney      Review of Systems  Constitutional: Negative for fever, chills and fatigue.  HENT: Negative for postnasal drip, rhinorrhea and sinus pressure.   Respiratory: Positive for shortness of breath. Negative for cough and wheezing.   Cardiovascular: Negative for chest pain, palpitations and leg swelling.       Objective:   Physical  Exam Filed Vitals:   10/24/15 1139  BP: 132/68  Pulse: 82  Height: 6\' 2"  (1.88 m)  Weight: 294 lb (133.358 kg)  SpO2: 91%   RA  Gen: obsee, well appearing HENT: OP clear, TM's clear, neck supple PULM: few crackles bases B, normal percussion CV: RRR, no mgr, trace edema GI: BS+, soft, nontender Derm: no cyanosis or rash Psyche: normal mood and affect  Chest x-ray images from the last several years personally reviewed, see below. Records from my partner were reviewed where indicated for his COPD which was deemed as stable.     Assessment & Plan:  COPD (chronic obstructive pulmonary disease) with emphysema ([place]) This has been a stable interval for [place]. Despite his severe COPD and history of repeated exacerbations he remains quite stable with Roflumilast. His pneumonia vaccines and immunizations for influenza are up-to-date.  The only question I have is whether or not he has pulmonary fibrosis. 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 I've taken the time to review these images as well as a CT chest from 2012 which showed primarily very significant emphysema. It's not clear to me that there is concomitant fibrosis.  Plan: Continue Symbicort and Spiriva Continue Roflumilast Follow-up 6 months or sooner if needed I will at  our colleagues in radiology to review his chest imaging of the last several years to see if they feel there is evidence of pulmonary fibrosis.  Chronic respiratory failure with hypoxia (HCC) Stable interval without any deterioration in the last several years. Hopefully this means there is not fibrosis despite the chest x-ray findings.  3L with exertion, 4-5 with heavy exertoin     Current outpatient prescriptions:  .  albuterol (PROAIR HFA) 108 (90 BASE) MCG/ACT inhaler, Inhale 2 puffs into the lungs every 4 (four) hours as needed for wheezing or shortness of breath., Disp: 3 Inhaler, Rfl: 3 .  allopurinol  (ZYLOPRIM) 300 MG tablet, Take 300 mg by mouth daily., Disp: , Rfl:  .  ALPRAZolam (XANAX) 0.5 MG tablet, Take 1 tablet (0.5 mg total) by mouth 2 (two) times daily., Disp: 60 tablet, Rfl: 0 .  amLODipine (NORVASC) 10 MG tablet, Take 10 mg by mouth daily. , Disp: , Rfl:  .  atorvastatin (LIPITOR) 20 MG tablet, Take 20 mg by mouth daily.  , Disp: , Rfl:  .  azelastine (ASTELIN) 0.1 % nasal spray, Once daily, Disp: , Rfl: 2 .  budesonide-formoterol (SYMBICORT) 160-4.5 MCG/ACT inhaler, INHALE 2 PUFFS INTO THE LUNGS TWICE A DAY, Disp: 30.6 g, Rfl: 3 .  fish oil-omega-3 fatty acids 1000 MG capsule, Take 1 capsule by mouth 3 (three) times daily.  , Disp: , Rfl:  .  furosemide (LASIX) 40 MG tablet, Take 40 mg by mouth daily. , Disp: , Rfl:  .  Linaclotide (LINZESS) 145 MCG CAPS capsule, Take 145 mcg by mouth daily., Disp: , Rfl:  .  metoprolol succinate (TOPROL-XL) 50 MG 24 hr tablet, Take 50 mg by mouth daily. Take with or immediately following a meal., Disp: , Rfl:  .  Multiple Vitamin (MULITIVITAMIN WITH MINERALS) TABS, Take 1 tablet by mouth daily., Disp: , Rfl:  .  roflumilast (DALIRESP) 500 MCG TABS tablet, Take 1 tablet (500 mcg total) by mouth daily., Disp: 90 tablet, Rfl: 3 .  rOPINIRole (REQUIP) 0.5 MG tablet, Take 1 tablet (0.5 mg total) by mouth daily., Disp: 30 tablet, Rfl: 0 .  temazepam (RESTORIL) 15 MG capsule, Take 15 mg by mouth at bedtime as needed for sleep. , Disp: , Rfl:  .  tiotropium (SPIRIVA HANDIHALER) 18 MCG inhalation capsule, INHALE THE CONTENTS OF 1 CAPSULE DAILY, Disp: 90 capsule, Rfl: 3 .  valsartan-hydrochlorothiazide (DIOVAN-HCT) 320-25 MG per tablet, Take 1 tablet by mouth daily., Disp: , Rfl:

## 2015-10-24 NOTE — Patient Instructions (Signed)
Keep taking your medications as you're doing We will get a chest x-ray on the next visit when you come to see me in 6 months Let us know if you have any problems between now and then

## 2015-10-24 NOTE — Assessment & Plan Note (Signed)
This has been a stable interval for [facility]. Despite his severe COPD and history of repeated exacerbations he remains quite stable with Roflumilast. His pneumonia vaccines and immunizations for influenza are up-to-date.  The only question I have is whether or not he has pulmonary fibrosis. 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 I've taken the time to review these images as well as a CT chest from 2012 which showed primarily very significant emphysema. It's not clear to me that there is concomitant fibrosis.  Plan: Continue Symbicort and Spiriva Continue Roflumilast Follow-up 6 months or sooner if needed I will at our colleagues in radiology to review his chest imaging of the last several years to see if they feel there is evidence of pulmonary fibrosis.

## 2015-10-29 ENCOUNTER — Encounter (HOSPITAL_COMMUNITY)
Admission: RE | Admit: 2015-10-29 | Discharge: 2015-10-29 | Disposition: A | Discharge status: Home / Self Care | Payer: Self-pay | Source: Ambulatory Visit | Attending: Internal Medicine | Admitting: Internal Medicine

## 2015-10-31 ENCOUNTER — Encounter (HOSPITAL_COMMUNITY)
Admission: RE | Admit: 2015-10-31 | Discharge: 2015-10-31 | Disposition: A | Discharge status: Home / Self Care | Payer: Self-pay | Source: Ambulatory Visit | Attending: Internal Medicine | Admitting: Internal Medicine

## 2015-11-05 ENCOUNTER — Encounter (HOSPITAL_COMMUNITY): Payer: Self-pay

## 2015-11-07 ENCOUNTER — Encounter (HOSPITAL_COMMUNITY): Payer: Self-pay

## 2015-11-12 ENCOUNTER — Encounter (HOSPITAL_COMMUNITY): Payer: BLUE CROSS/BLUE SHIELD

## 2015-11-12 DIAGNOSIS — I10 Essential (primary) hypertension: Secondary | ICD-10-CM | POA: Insufficient documentation

## 2015-11-12 DIAGNOSIS — J439 Emphysema, unspecified: Secondary | ICD-10-CM | POA: Insufficient documentation

## 2015-11-12 DIAGNOSIS — J9611 Chronic respiratory failure with hypoxia: Secondary | ICD-10-CM | POA: Insufficient documentation

## 2015-11-14 ENCOUNTER — Encounter (HOSPITAL_COMMUNITY): Payer: Self-pay

## 2015-11-19 ENCOUNTER — Encounter (HOSPITAL_COMMUNITY): Payer: Self-pay

## 2015-11-21 ENCOUNTER — Encounter (HOSPITAL_COMMUNITY)
Admission: RE | Admit: 2015-11-21 | Discharge: 2015-11-21 | Disposition: A | Discharge status: Home / Self Care | Payer: Self-pay | Source: Ambulatory Visit | Attending: Internal Medicine | Admitting: Internal Medicine

## 2015-11-26 ENCOUNTER — Encounter (HOSPITAL_COMMUNITY)
Admission: RE | Admit: 2015-11-26 | Discharge: 2015-11-26 | Disposition: A | Discharge status: Home / Self Care | Payer: Self-pay | Source: Ambulatory Visit | Attending: Internal Medicine | Admitting: Internal Medicine

## 2015-11-28 ENCOUNTER — Encounter (HOSPITAL_COMMUNITY)
Admission: RE | Admit: 2015-11-28 | Discharge: 2015-11-28 | Disposition: A | Discharge status: Home / Self Care | Payer: Self-pay | Source: Ambulatory Visit | Attending: Internal Medicine | Admitting: Internal Medicine

## 2015-12-03 ENCOUNTER — Encounter (HOSPITAL_COMMUNITY)
Admission: RE | Admit: 2015-12-03 | Discharge: 2015-12-03 | Disposition: A | Discharge status: Home / Self Care | Payer: Self-pay | Source: Ambulatory Visit | Attending: Internal Medicine | Admitting: Internal Medicine

## 2015-12-05 ENCOUNTER — Encounter (HOSPITAL_COMMUNITY)
Admission: RE | Admit: 2015-12-05 | Discharge: 2015-12-05 | Disposition: A | Discharge status: Home / Self Care | Payer: Self-pay | Source: Ambulatory Visit | Attending: Internal Medicine | Admitting: Internal Medicine

## 2015-12-10 ENCOUNTER — Encounter (HOSPITAL_COMMUNITY)
Admission: RE | Admit: 2015-12-10 | Discharge: 2015-12-10 | Disposition: A | Discharge status: Home / Self Care | Payer: Self-pay | Source: Ambulatory Visit | Attending: Internal Medicine | Admitting: Internal Medicine

## 2015-12-12 ENCOUNTER — Encounter (HOSPITAL_COMMUNITY)
Admission: RE | Admit: 2015-12-12 | Discharge: 2015-12-12 | Disposition: A | Discharge status: Home / Self Care | Payer: Self-pay | Source: Ambulatory Visit | Attending: Internal Medicine | Admitting: Internal Medicine

## 2015-12-12 DIAGNOSIS — J439 Emphysema, unspecified: Secondary | ICD-10-CM | POA: Insufficient documentation

## 2015-12-12 DIAGNOSIS — I10 Essential (primary) hypertension: Secondary | ICD-10-CM | POA: Insufficient documentation

## 2015-12-12 DIAGNOSIS — J9611 Chronic respiratory failure with hypoxia: Secondary | ICD-10-CM | POA: Insufficient documentation

## 2015-12-17 ENCOUNTER — Encounter (HOSPITAL_COMMUNITY)
Admission: RE | Admit: 2015-12-17 | Discharge: 2015-12-17 | Disposition: A | Discharge status: Home / Self Care | Payer: Self-pay | Source: Ambulatory Visit | Attending: Internal Medicine | Admitting: Internal Medicine

## 2015-12-19 ENCOUNTER — Encounter (HOSPITAL_COMMUNITY)
Admission: RE | Admit: 2015-12-19 | Discharge: 2015-12-19 | Disposition: A | Discharge status: Home / Self Care | Payer: Self-pay | Source: Ambulatory Visit | Attending: Internal Medicine | Admitting: Internal Medicine

## 2015-12-24 ENCOUNTER — Encounter (HOSPITAL_COMMUNITY)
Admission: RE | Admit: 2015-12-24 | Discharge: 2015-12-24 | Disposition: A | Discharge status: Home / Self Care | Payer: Self-pay | Source: Ambulatory Visit | Attending: Internal Medicine | Admitting: Internal Medicine

## 2015-12-26 ENCOUNTER — Other Ambulatory Visit: Payer: Self-pay | Admitting: Gastroenterology

## 2015-12-26 ENCOUNTER — Encounter (HOSPITAL_COMMUNITY): Payer: Self-pay

## 2015-12-31 ENCOUNTER — Encounter (HOSPITAL_COMMUNITY)
Admission: RE | Admit: 2015-12-31 | Discharge: 2015-12-31 | Disposition: A | Discharge status: Home / Self Care | Payer: Self-pay | Source: Ambulatory Visit | Attending: Internal Medicine | Admitting: Internal Medicine

## 2016-01-02 ENCOUNTER — Encounter (HOSPITAL_COMMUNITY)
Admission: RE | Admit: 2016-01-02 | Discharge: 2016-01-02 | Disposition: A | Discharge status: Home / Self Care | Payer: Self-pay | Source: Ambulatory Visit | Attending: Internal Medicine | Admitting: Internal Medicine

## 2016-01-07 ENCOUNTER — Encounter (HOSPITAL_COMMUNITY): Payer: Self-pay

## 2016-01-09 ENCOUNTER — Encounter (HOSPITAL_COMMUNITY)
Admission: RE | Admit: 2016-01-09 | Discharge: 2016-01-09 | Disposition: A | Discharge status: Home / Self Care | Payer: Self-pay | Source: Ambulatory Visit | Attending: Internal Medicine | Admitting: Internal Medicine

## 2016-01-09 DIAGNOSIS — J439 Emphysema, unspecified: Secondary | ICD-10-CM | POA: Insufficient documentation

## 2016-01-09 DIAGNOSIS — J9611 Chronic respiratory failure with hypoxia: Secondary | ICD-10-CM | POA: Insufficient documentation

## 2016-01-09 DIAGNOSIS — I10 Essential (primary) hypertension: Secondary | ICD-10-CM | POA: Insufficient documentation

## 2016-01-14 ENCOUNTER — Encounter (HOSPITAL_COMMUNITY)
Admission: RE | Admit: 2016-01-14 | Discharge: 2016-01-14 | Disposition: A | Discharge status: Home / Self Care | Payer: Self-pay | Source: Ambulatory Visit | Attending: Internal Medicine | Admitting: Internal Medicine

## 2016-01-16 ENCOUNTER — Encounter (HOSPITAL_COMMUNITY): Payer: Self-pay

## 2016-01-21 ENCOUNTER — Encounter (HOSPITAL_COMMUNITY)
Admission: RE | Admit: 2016-01-21 | Discharge: 2016-01-21 | Disposition: A | Discharge status: Home / Self Care | Payer: Self-pay | Source: Ambulatory Visit | Attending: Internal Medicine | Admitting: Internal Medicine

## 2016-01-23 ENCOUNTER — Encounter (HOSPITAL_COMMUNITY)
Admission: RE | Admit: 2016-01-23 | Discharge: 2016-01-23 | Disposition: A | Discharge status: Home / Self Care | Payer: Self-pay | Source: Ambulatory Visit | Attending: Internal Medicine | Admitting: Internal Medicine

## 2016-01-28 ENCOUNTER — Encounter (HOSPITAL_COMMUNITY)
Admission: RE | Admit: 2016-01-28 | Discharge: 2016-01-28 | Disposition: A | Discharge status: Home / Self Care | Payer: Self-pay | Source: Ambulatory Visit | Attending: Internal Medicine | Admitting: Internal Medicine

## 2016-01-30 ENCOUNTER — Encounter (HOSPITAL_COMMUNITY)
Admission: RE | Admit: 2016-01-30 | Discharge: 2016-01-30 | Disposition: A | Discharge status: Home / Self Care | Payer: Self-pay | Source: Ambulatory Visit | Attending: Internal Medicine | Admitting: Internal Medicine

## 2016-02-04 ENCOUNTER — Encounter (HOSPITAL_COMMUNITY)
Admission: RE | Admit: 2016-02-04 | Discharge: 2016-02-04 | Disposition: A | Discharge status: Home / Self Care | Payer: Self-pay | Source: Ambulatory Visit | Attending: Internal Medicine | Admitting: Internal Medicine

## 2016-02-05 ENCOUNTER — Other Ambulatory Visit: Payer: Self-pay | Admitting: Family Medicine

## 2016-02-05 ENCOUNTER — Ambulatory Visit
Admission: RE | Admit: 2016-02-05 | Discharge: 2016-02-05 | Disposition: A | Discharge status: Home / Self Care | Payer: BLUE CROSS/BLUE SHIELD | Source: Ambulatory Visit | Attending: Family Medicine | Admitting: Family Medicine

## 2016-02-05 DIAGNOSIS — M150 Primary generalized (osteo)arthritis: Secondary | ICD-10-CM

## 2016-02-06 ENCOUNTER — Encounter (HOSPITAL_COMMUNITY)
Admission: RE | Admit: 2016-02-06 | Discharge: 2016-02-06 | Disposition: A | Discharge status: Home / Self Care | Payer: Self-pay | Source: Ambulatory Visit | Attending: Internal Medicine | Admitting: Internal Medicine

## 2016-02-11 ENCOUNTER — Encounter (HOSPITAL_COMMUNITY)
Admission: RE | Admit: 2016-02-11 | Discharge: 2016-02-11 | Disposition: A | Discharge status: Home / Self Care | Payer: Self-pay | Source: Ambulatory Visit | Attending: Internal Medicine | Admitting: Internal Medicine

## 2016-02-11 DIAGNOSIS — I10 Essential (primary) hypertension: Secondary | ICD-10-CM | POA: Insufficient documentation

## 2016-02-11 DIAGNOSIS — J439 Emphysema, unspecified: Secondary | ICD-10-CM | POA: Insufficient documentation

## 2016-02-11 DIAGNOSIS — J9611 Chronic respiratory failure with hypoxia: Secondary | ICD-10-CM | POA: Insufficient documentation

## 2016-02-13 ENCOUNTER — Encounter (HOSPITAL_COMMUNITY)
Admission: RE | Admit: 2016-02-13 | Discharge: 2016-02-13 | Disposition: A | Discharge status: Home / Self Care | Payer: Self-pay | Source: Ambulatory Visit | Attending: Internal Medicine | Admitting: Internal Medicine

## 2016-02-18 ENCOUNTER — Encounter (HOSPITAL_COMMUNITY)
Admission: RE | Admit: 2016-02-18 | Discharge: 2016-02-18 | Disposition: A | Discharge status: Home / Self Care | Payer: Self-pay | Source: Ambulatory Visit | Attending: Internal Medicine | Admitting: Internal Medicine

## 2016-02-20 ENCOUNTER — Encounter (HOSPITAL_COMMUNITY)
Admission: RE | Admit: 2016-02-20 | Discharge: 2016-02-20 | Disposition: A | Discharge status: Home / Self Care | Payer: Self-pay | Source: Ambulatory Visit | Attending: Internal Medicine | Admitting: Internal Medicine

## 2016-02-25 ENCOUNTER — Encounter (HOSPITAL_COMMUNITY): Payer: Self-pay

## 2016-02-27 ENCOUNTER — Encounter (HOSPITAL_COMMUNITY)
Admission: RE | Admit: 2016-02-27 | Discharge: 2016-02-27 | Disposition: A | Discharge status: Home / Self Care | Payer: Self-pay | Source: Ambulatory Visit | Attending: Internal Medicine | Admitting: Internal Medicine

## 2016-02-27 ENCOUNTER — Telehealth: Payer: Self-pay | Admitting: Pulmonary Disease

## 2016-02-27 DIAGNOSIS — J438 Other emphysema: Secondary | ICD-10-CM

## 2016-02-27 NOTE — Telephone Encounter (Signed)
OK by me 

## 2016-02-27 NOTE — Telephone Encounter (Signed)
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

## 2016-02-28 NOTE — Telephone Encounter (Signed)
atc pt, line rang several times, no answer, no vm, asked for "remote access code".   Order placed for 02 concentrator.

## 2016-02-28 NOTE — Telephone Encounter (Signed)
Pt aware order was sent 

## 2016-03-03 ENCOUNTER — Encounter (HOSPITAL_COMMUNITY)
Admission: RE | Admit: 2016-03-03 | Discharge: 2016-03-03 | Disposition: A | Discharge status: Home / Self Care | Payer: Self-pay | Source: Ambulatory Visit | Attending: Internal Medicine | Admitting: Internal Medicine

## 2016-03-05 ENCOUNTER — Encounter (HOSPITAL_COMMUNITY)
Admission: RE | Admit: 2016-03-05 | Discharge: 2016-03-05 | Disposition: A | Discharge status: Home / Self Care | Payer: Self-pay | Source: Ambulatory Visit | Attending: Internal Medicine | Admitting: Internal Medicine

## 2016-03-10 ENCOUNTER — Encounter (HOSPITAL_COMMUNITY): Payer: Self-pay

## 2016-03-10 DIAGNOSIS — I10 Essential (primary) hypertension: Secondary | ICD-10-CM | POA: Insufficient documentation

## 2016-03-10 DIAGNOSIS — J9611 Chronic respiratory failure with hypoxia: Secondary | ICD-10-CM | POA: Insufficient documentation

## 2016-03-10 DIAGNOSIS — J439 Emphysema, unspecified: Secondary | ICD-10-CM | POA: Insufficient documentation

## 2016-03-12 ENCOUNTER — Encounter (HOSPITAL_COMMUNITY): Payer: Self-pay

## 2016-03-17 ENCOUNTER — Encounter (HOSPITAL_COMMUNITY)
Admission: RE | Admit: 2016-03-17 | Discharge: 2016-03-17 | Disposition: A | Discharge status: Home / Self Care | Payer: Self-pay | Source: Ambulatory Visit | Attending: Internal Medicine | Admitting: Internal Medicine

## 2016-03-19 ENCOUNTER — Encounter (HOSPITAL_COMMUNITY)
Admission: RE | Admit: 2016-03-19 | Discharge: 2016-03-19 | Disposition: A | Discharge status: Home / Self Care | Payer: Self-pay | Source: Ambulatory Visit | Attending: Internal Medicine | Admitting: Internal Medicine

## 2016-03-24 ENCOUNTER — Encounter (HOSPITAL_COMMUNITY)
Admission: RE | Admit: 2016-03-24 | Discharge: 2016-03-24 | Disposition: A | Discharge status: Home / Self Care | Payer: Self-pay | Source: Ambulatory Visit | Attending: Internal Medicine | Admitting: Internal Medicine

## 2016-03-26 ENCOUNTER — Encounter (HOSPITAL_COMMUNITY)
Admission: RE | Admit: 2016-03-26 | Discharge: 2016-03-26 | Disposition: A | Discharge status: Home / Self Care | Payer: Self-pay | Source: Ambulatory Visit | Attending: Internal Medicine | Admitting: Internal Medicine

## 2016-03-31 ENCOUNTER — Encounter (HOSPITAL_COMMUNITY)
Admission: RE | Admit: 2016-03-31 | Discharge: 2016-03-31 | Disposition: A | Discharge status: Home / Self Care | Payer: Self-pay | Source: Ambulatory Visit | Attending: Internal Medicine | Admitting: Internal Medicine

## 2016-04-02 ENCOUNTER — Encounter (HOSPITAL_COMMUNITY): Payer: Self-pay

## 2016-04-07 ENCOUNTER — Encounter (HOSPITAL_COMMUNITY): Payer: Self-pay

## 2016-04-14 ENCOUNTER — Encounter (HOSPITAL_COMMUNITY): Payer: Self-pay

## 2016-04-14 DIAGNOSIS — J439 Emphysema, unspecified: Secondary | ICD-10-CM | POA: Insufficient documentation

## 2016-04-14 DIAGNOSIS — I10 Essential (primary) hypertension: Secondary | ICD-10-CM | POA: Insufficient documentation

## 2016-04-14 DIAGNOSIS — J9611 Chronic respiratory failure with hypoxia: Secondary | ICD-10-CM | POA: Insufficient documentation

## 2016-04-16 ENCOUNTER — Encounter (HOSPITAL_COMMUNITY)
Admission: RE | Admit: 2016-04-16 | Discharge: 2016-04-16 | Disposition: A | Discharge status: Home / Self Care | Payer: Self-pay | Source: Ambulatory Visit | Attending: Internal Medicine | Admitting: Internal Medicine

## 2016-04-21 ENCOUNTER — Other Ambulatory Visit: Payer: BLUE CROSS/BLUE SHIELD

## 2016-04-21 ENCOUNTER — Ambulatory Visit (INDEPENDENT_AMBULATORY_CARE_PROVIDER_SITE_OTHER): Payer: BLUE CROSS/BLUE SHIELD | Admitting: Pulmonary Disease

## 2016-04-21 ENCOUNTER — Encounter: Payer: Self-pay | Admitting: Pulmonary Disease

## 2016-04-21 ENCOUNTER — Encounter (HOSPITAL_COMMUNITY)
Admission: RE | Admit: 2016-04-21 | Discharge: 2016-04-21 | Disposition: A | Discharge status: Home / Self Care | Payer: Self-pay | Source: Ambulatory Visit | Attending: Pulmonary Disease | Admitting: Pulmonary Disease

## 2016-04-21 ENCOUNTER — Ambulatory Visit (INDEPENDENT_AMBULATORY_CARE_PROVIDER_SITE_OTHER)
Admission: RE | Admit: 2016-04-21 | Discharge: 2016-04-21 | Disposition: A | Discharge status: Home / Self Care | Payer: BLUE CROSS/BLUE SHIELD | Source: Ambulatory Visit | Attending: Pulmonary Disease | Admitting: Pulmonary Disease

## 2016-04-21 VITALS — BP 136/68 | HR 80 | Ht 74.0 in | Wt 292.4 lb

## 2016-04-21 DIAGNOSIS — J441 Chronic obstructive pulmonary disease with (acute) exacerbation: Secondary | ICD-10-CM

## 2016-04-21 DIAGNOSIS — I482 Chronic atrial fibrillation, unspecified: Secondary | ICD-10-CM

## 2016-04-21 DIAGNOSIS — R0602 Shortness of breath: Secondary | ICD-10-CM

## 2016-04-21 DIAGNOSIS — J9611 Chronic respiratory failure with hypoxia: Secondary | ICD-10-CM | POA: Diagnosis not present

## 2016-04-21 DIAGNOSIS — J438 Other emphysema: Secondary | ICD-10-CM

## 2016-04-21 DIAGNOSIS — R0600 Dyspnea, unspecified: Secondary | ICD-10-CM | POA: Insufficient documentation

## 2016-04-21 DIAGNOSIS — I4891 Unspecified atrial fibrillation: Secondary | ICD-10-CM

## 2016-04-21 NOTE — Assessment & Plan Note (Signed)
Today on exam he had an irregular heart beat so we checked an EKG and diagnosed atrial fibrillation. Currently he is rate controlled. I suspect this has something to do with his increased dyspnea.  Plan: Start aspirin 325 g daily Refer back to his cardiologist Dr. Einar Gip

## 2016-04-21 NOTE — Assessment & Plan Note (Signed)
Continue 3 L with minimal exertion, 4-5 with heavy exertion (pulmonary rehabilitation)

## 2016-04-21 NOTE — Patient Instructions (Addendum)
We will refer you to Dr. Einar Gip again in regards to your atrial fibrillation Take aspirin 325 mg daily Continue taking your inhaled medicines as you're doing We will call you with the results of the blood test, the chest x-ray, and the lung function test We will see you back in 4-6 weeks or sooner if needed, f/u with NP OK

## 2016-04-21 NOTE — Progress Notes (Signed)
Subjective:    Patient ID: Harold Hall, male    DOB: [birth year], 70 y.o.   MRN: [id number]  Synopsis: Former patient of Dr. Gwenette Greet with COPD. Dr. Gwenette Greet summarized his situation as follows: Spiro 2007:  FEV1 2.10 (46%), ratio 47 Stopped smoking 05/2011 Pulm rehab 2012, 2016 On exertional oxygen with portable concentrator. +response to daliresp.  He walks with 3L with exertion, 4-5 with heavy exercise Participating in pulmonary rehab in 2016  HPI Chief Complaint  Patient presents with  . Follow-up    pt c/o worsening SOB with exertion, prod cough with clear/white mucus.  CAT score 16   Harold Hall continues to participate in the maintenance pulmonary rehab plan at our hospital.  This going really well.  He continues to take his Daliresp daily.    He had a sinus infection in December that lasted a week to a week and a half.  It went away but he had some chest congestion since then and he feels dramatically more dyspneic than he did before.  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 Paradoxically he has not had dyspnea when doing somewhat heavy yardwork.  He doesn't have chest congestion much now that he has been taking mucinex.  He has not seen a drop in his distance or performance in the gym.  He has been swelling more in his legs.  He continues to use his oxygen with exertion.  He doesn't use it at rest.  He has noticed that his oxygen saturation will increase when he does lat pull downs at the gym.     Past Medical History  Diagnosis Date  . Allergic rhinitis   . Hypertension   . RLS (restless legs syndrome)   . COPD (chronic obstructive pulmonary disease) ([place])   . Gout   . History of kidney cancer   . Arthritis   . Hernia   . Sinus problem   . Erectile dysfunction   . Hypercholesterolemia   . Cancer [facility])     left renal -solitary kidney      Review of Systems  Constitutional: Negative for fever, chills  and fatigue.  HENT: Negative for postnasal drip, rhinorrhea and sinus pressure.   Respiratory: Positive for shortness of breath. Negative for cough and wheezing.   Cardiovascular: Positive for leg swelling. Negative for chest pain and palpitations.       Objective:   Physical Exam Filed Vitals:   04/21/16 1127  BP: 136/68  Pulse: 80  Height: 6\' 2"  (1.88 m)  Weight: 292 lb 6.4 oz (132.632 kg)  SpO2: 90%   RA  Gen: obsee, well appearing HENT: OP clear, TM's clear, neck supple PULM: few crackles R bases  Normal effort CV: Irreg irreg today, no mgr, significant edema GI: BS+, soft, nontender Derm: no cyanosis or rash Psyche: normal mood and affect  EKG performed today shows atrial fibrillation  Last chest x-ray and CT scan reviewed showing severe emphysema.     Assessment & Plan:  Atrial fibrillation ([place]) Today on exam he had an irregular heart beat so we checked an EKG and diagnosed atrial fibrillation. Currently he is rate controlled. I suspect this has something to do with his increased dyspnea.  Plan: Start aspirin 325 g daily Refer back to his cardiologist Dr. Einar Gip  Chronic respiratory failure with hypoxia ([place]) Continue 3 L with minimal exertion, 4-5 with heavy exertion (pulmonary rehabilitation)  COPD (chronic obstructive  pulmonary disease) with emphysema ([place]) I am concerned about the increase in his shortness of breath. I suspect is related to his atrial fibrillation. However, it has been over 6 years since his last lung function test and the radiologist raised the question of fibrosis on his last chest x-ray. I doubt he has fibrosis but I think it's worth looking into a bit more.  Plan: Continue current treatment for COPD Check lung function testing Check chest x-ray Check proBNP  Dyspnea As above     Current outpatient prescriptions:  .  albuterol (PROAIR HFA) 108 (90 BASE) MCG/ACT inhaler, Inhale 2 puffs into the lungs every 4 (four) hours as  needed for wheezing or shortness of breath., Disp: 3 Inhaler, Rfl: 3 .  allopurinol (ZYLOPRIM) 300 MG tablet, Take 300 mg by mouth daily., Disp: , Rfl:  .  ALPRAZolam (XANAX) 0.5 MG tablet, Take 1 tablet (0.5 mg total) by mouth 2 (two) times daily., Disp: 60 tablet, Rfl: 0 .  amLODipine (NORVASC) 10 MG tablet, Take 10 mg by mouth daily. , Disp: , Rfl:  .  azelastine (ASTELIN) 0.1 % nasal spray, Once daily, Disp: , Rfl: 2 .  budesonide-formoterol (SYMBICORT) 160-4.5 MCG/ACT inhaler, INHALE 2 PUFFS INTO THE LUNGS TWICE A DAY, Disp: 30.6 g, Rfl: 3 .  fish oil-omega-3 fatty acids 1000 MG capsule, Take 1 capsule by mouth 3 (three) times daily.  , Disp: , Rfl:  .  furosemide (LASIX) 40 MG tablet, Take 40 mg by mouth daily. , Disp: , Rfl:  .  Linaclotide (LINZESS) 145 MCG CAPS capsule, Take 145 mcg by mouth daily., Disp: , Rfl:  .  metoprolol succinate (TOPROL-XL) 50 MG 24 hr tablet, Take 50 mg by mouth daily. Take with or immediately following a meal., Disp: , Rfl:  .  Multiple Vitamin (MULITIVITAMIN WITH MINERALS) TABS, Take 1 tablet by mouth daily., Disp: , Rfl:  .  roflumilast (DALIRESP) 500 MCG TABS tablet, Take 1 tablet (500 mcg total) by mouth daily., Disp: 90 tablet, Rfl: 3 .  rOPINIRole (REQUIP) 0.5 MG tablet, Take 1 tablet (0.5 mg total) by mouth daily., Disp: 30 tablet, Rfl: 0 .  temazepam (RESTORIL) 15 MG capsule, Take 15 mg by mouth at bedtime as needed for sleep. , Disp: , Rfl:  .  tiotropium (SPIRIVA HANDIHALER) 18 MCG inhalation capsule, INHALE THE CONTENTS OF 1 CAPSULE DAILY, Disp: 90 capsule, Rfl: 3 .  valsartan-hydrochlorothiazide (DIOVAN-HCT) 320-25 MG per tablet, Take 1 tablet by mouth daily., Disp: , Rfl:

## 2016-04-21 NOTE — Assessment & Plan Note (Signed)
I am concerned about the increase in his shortness of breath. I suspect is related to his atrial fibrillation. However, it has been over 6 years since his last lung function test and the radiologist raised the question of fibrosis on his last chest x-ray. I doubt he has fibrosis but I think it's worth looking into a bit more.  Plan: Continue current treatment for COPD Check lung function testing Check chest x-ray Check proBNP

## 2016-04-21 NOTE — Assessment & Plan Note (Signed)
As above.

## 2016-04-22 ENCOUNTER — Ambulatory Visit (INDEPENDENT_AMBULATORY_CARE_PROVIDER_SITE_OTHER): Payer: BLUE CROSS/BLUE SHIELD | Admitting: Pulmonary Disease

## 2016-04-22 DIAGNOSIS — R0602 Shortness of breath: Secondary | ICD-10-CM

## 2016-04-22 LAB — PULMONARY FUNCTION TEST
DL/VA % PRED: 48 %
DL/VA: 2.32 ml/min/mmHg/L
DLCO UNC % PRED: 34 %
DLCO cor % pred: 33 %
DLCO cor: 12.67 ml/min/mmHg
DLCO unc: 13.18 ml/min/mmHg
FEF 25-75 PRE: 0.71 L/s
FEF 25-75 Post: 0.77 L/sec
FEF2575-%CHANGE-POST: 8 %
FEF2575-%Pred-Post: 26 %
FEF2575-%Pred-Pre: 24 %
FEV1-%CHANGE-POST: 0 %
FEV1-%PRED-PRE: 46 %
FEV1-%Pred-Post: 46 %
FEV1-PRE: 1.77 L
FEV1-Post: 1.76 L
FEV1FVC-%CHANGE-POST: -5 %
FEV1FVC-%Pred-Pre: 66 %
FEV6-%Change-Post: 8 %
FEV6-%PRED-PRE: 69 %
FEV6-%Pred-Post: 75 %
FEV6-POST: 3.66 L
FEV6-PRE: 3.38 L
FEV6FVC-%Change-Post: 2 %
FEV6FVC-%Pred-Post: 101 %
FEV6FVC-%Pred-Pre: 99 %
FVC-%CHANGE-POST: 5 %
FVC-%PRED-POST: 73 %
FVC-%PRED-PRE: 69 %
FVC-POST: 3.78 L
FVC-PRE: 3.58 L
POST FEV6/FVC RATIO: 97 %
PRE FEV1/FVC RATIO: 49 %
PRE FEV6/FVC RATIO: 94 %
Post FEV1/FVC ratio: 47 %
RV % PRED: 93 %
RV: 2.49 L
TLC % pred: 82 %
TLC: 6.45 L

## 2016-04-22 LAB — PRO B NATRIURETIC PEPTIDE: PRO B NATRI PEPTIDE: 459.5 pg/mL — AB (ref <126)

## 2016-04-22 NOTE — Progress Notes (Signed)
PFT done today. 

## 2016-04-23 ENCOUNTER — Telehealth: Payer: Self-pay | Admitting: Pulmonary Disease

## 2016-04-23 ENCOUNTER — Encounter (HOSPITAL_COMMUNITY)
Admission: RE | Admit: 2016-04-23 | Discharge: 2016-04-23 | Disposition: A | Discharge status: Home / Self Care | Payer: Self-pay | Source: Ambulatory Visit | Attending: Pulmonary Disease | Admitting: Pulmonary Disease

## 2016-04-23 NOTE — Telephone Encounter (Signed)
Result Notes     Notes Recorded by Len Blalock, CMA on 04/23/2016 at 11:00 AM lmtcb X1 for pt to relay results/recs.  ------  Notes Recorded by Juanito Doom, MD on 04/22/2016 at 10:57 PM A, Please let the patient know this was OK Thanks, B   Attempted to call pt. Line was busy. Will try back.

## 2016-04-23 NOTE — Telephone Encounter (Signed)
Spoke with pt and notified of results per Dr.McQuaid. Pt verbalized understanding and denied any questions. 

## 2016-04-28 ENCOUNTER — Telehealth: Payer: Self-pay | Admitting: Pulmonary Disease

## 2016-04-28 ENCOUNTER — Encounter (HOSPITAL_COMMUNITY)
Admission: RE | Admit: 2016-04-28 | Discharge: 2016-04-28 | Disposition: A | Discharge status: Home / Self Care | Payer: Self-pay | Source: Ambulatory Visit | Attending: Pulmonary Disease | Admitting: Pulmonary Disease

## 2016-04-28 NOTE — Telephone Encounter (Signed)
[place]ron, [place] - 04/22/16 >','<< Less Detail',event)" href="javascript:;">More Detail >>   Juanito Doom, MD   Sent: Thu April 23, 2016 10:27 PM    To: Len Blalock, CMA        Result Note     A,    Please let him know that his PFTs are a bit worse than when they were done 10 years ago, but the decline is expected with aging and doesn't necessarily reflect worsening COPD.    Thanks    B      Spoke with pt and notified of results per Dr. Lake Bells. Pt verbalized understanding and denied any questions.

## 2016-04-30 ENCOUNTER — Encounter (HOSPITAL_COMMUNITY)
Admission: RE | Admit: 2016-04-30 | Discharge: 2016-04-30 | Disposition: A | Discharge status: Home / Self Care | Payer: Self-pay | Source: Ambulatory Visit | Attending: Pulmonary Disease | Admitting: Pulmonary Disease

## 2016-05-05 ENCOUNTER — Encounter (HOSPITAL_COMMUNITY)
Admission: RE | Admit: 2016-05-05 | Discharge: 2016-05-05 | Disposition: A | Discharge status: Home / Self Care | Payer: Self-pay | Source: Ambulatory Visit | Attending: Pulmonary Disease | Admitting: Pulmonary Disease

## 2016-05-07 ENCOUNTER — Encounter (HOSPITAL_COMMUNITY)
Admission: RE | Admit: 2016-05-07 | Discharge: 2016-05-07 | Disposition: A | Discharge status: Home / Self Care | Payer: Self-pay | Source: Ambulatory Visit | Attending: Pulmonary Disease | Admitting: Pulmonary Disease

## 2016-05-12 ENCOUNTER — Encounter (HOSPITAL_COMMUNITY): Payer: Self-pay

## 2016-05-12 DIAGNOSIS — J9611 Chronic respiratory failure with hypoxia: Secondary | ICD-10-CM | POA: Insufficient documentation

## 2016-05-12 DIAGNOSIS — I10 Essential (primary) hypertension: Secondary | ICD-10-CM | POA: Insufficient documentation

## 2016-05-12 DIAGNOSIS — J439 Emphysema, unspecified: Secondary | ICD-10-CM | POA: Insufficient documentation

## 2016-05-14 ENCOUNTER — Encounter (HOSPITAL_COMMUNITY)
Admission: RE | Admit: 2016-05-14 | Discharge: 2016-05-14 | Disposition: A | Discharge status: Home / Self Care | Payer: Self-pay | Source: Ambulatory Visit | Attending: Internal Medicine | Admitting: Internal Medicine

## 2016-05-19 ENCOUNTER — Encounter (HOSPITAL_COMMUNITY)
Admission: RE | Admit: 2016-05-19 | Discharge: 2016-05-19 | Disposition: A | Discharge status: Home / Self Care | Payer: Self-pay | Source: Ambulatory Visit | Attending: Pulmonary Disease | Admitting: Pulmonary Disease

## 2016-05-21 ENCOUNTER — Encounter (HOSPITAL_COMMUNITY)
Admission: RE | Admit: 2016-05-21 | Discharge: 2016-05-21 | Disposition: A | Discharge status: Home / Self Care | Payer: Self-pay | Source: Ambulatory Visit | Attending: Pulmonary Disease | Admitting: Pulmonary Disease

## 2016-05-24 NOTE — H&P (Signed)
OFFICE VISIT NOTES COPIED TO EPIC FOR DOCUMENTATION  . History of Present Illness Harold Hall AGNP-C; 05/20/2016 5:28 PM) The patient is a 70 year old male, accompanied by his wife during the visit, who presents for a Follow-up for Dyspnea. He has a history of HTN, COPD on home O2 (3L), and HLD who presented for evaluation of Dyspnea. He had been seen by his pulmonologist Dr. Gwenette Greet. Dr. Gwenette Greet noted that he has severe COPD but his exacerbations were not classic for COPD. He has had several rounds of steroids and antibiotics without improvement. He thus recommended that he see a cardiolgist for further evaluation to assess for underlying cardiac disease as this may be contributing to worsening dysnpea. Stress testing and echocardiogram did not reveal any cardiac etiology of his symptoms and he was made PRN, last seen 1 1/2 years ago. He recently saw Dr. Lake Bells, who he is now seeing for pulmonology, for worsening dyspnea over the past 3-4 months. After examining him, he performed an EKG which revealed atrial fibrillation. He was referred back here for reevaluation and further recommendations.  No chest pain, per se, but endorses a constant pressure. He notes that he is unable to sleep flat and sleeps in a recliner, which is not new. No report PND. He reports occasional LE edema and he takes lasix 40 mg daily, but feels this is worsening. He was scheduled for echocardiogram and nuclear stress test and presents today for follow up.   Problem List/Past Medical Anderson Malta Sergeant; 05/19/2016 11:16 AM) Centrilobular emphysema (J43.2)  CT angio 05/22/2011: Mediastinal lymph nodes are not enlarged by CT size criteria. No hilar or axillary adenopathy. Atherosclerotic calcification of the arterial vasculature, including coronary arteries. Heart size normal. Anterior pericardial fluid or thickening. Pulmonary arteries are enlarged. Biapical pleural parenchymal scarring, left greater than right.  Emphysema. Focal mucoid impaction in the anterior segment right upper lobe (image 25). Lungs are otherwise clear. No pleural fluid. Scattered debris in the trachea. Coronary artery calcification seen on CAT scan (I25.10)  CT angio 05/22/2011: calcification of the arterial vasculature, including coronary arteries. Heart size normal. Anterior pericardial fluid or thickening. Pulmonary arteries are enlarged. Emphysema and apical scarring noted. Lexiscan sestamibi stress test 08/31/2014: 1. The resting electrocardiogram demonstrated normal sinus rhythm, poor R-wave progression, no resting arrhythmias and normal rest repolarization. Stress symptoms included dyspnea. 2. The perfusion imaging study demonstrates mild diaphragmatic continuation artifact in the inferior wall. There is no evidence of ischemia or scar. Left ventricular systolic function calculated by QGS was 81%. This represents a low risk study. Shortness of breath at rest (R06.02)  Echo- 09/12/14 - Left ventricle cavity is normal in size. Moderate concentric hypertrophy of the left ventricle. Normal global wall motion. Doppler evidence of grade I (impaired) diastolic dysfunction with elevated LV filling pressure. Calculated EF 55%. - Left atrial cavity is mildly dilated. Mild inter atrial septal aneurysm. - Right ventricle cavity is normal in size. Mild concentric hypertrophy. Normal right ventricular function. - Structurally normal tricuspid valve with trace regurgitation. No evidence of pulmonary hypertension. Essential hypertension (I10)  Pure hypercholesterolemia (E78.00)  Bilateral carotid bruits (R09.89)  Carotid artery duplex 09/26/2014: Minimal disease of the bilateral external carotid artery with < 50% stenosis. There is minimal evidence of heterogeneous plaque in the bilateral external carotid arteries. Follow up in one year is appropriate if clinically indicated. 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 S/P Left nephrectoy March 1994 New onset atrial fibrillation (I48.91) 04/21/2016 CHA2DS2-VASc Score is 3 with yearly risk of stroke of 3.2%. HAS-Bled score is 1 and estimated major bleeding in one year is 1.02-1.5%.  Allergies Anderson Malta Sergeant; 2016/06/05 11:16 AM) No Known Drug Allergies 08/21/2014  Family History Anderson Malta Sergeant; 05-Jun-2016 11:16 AM) Mother  Deceased. at age 19: hx of copd, htn, osteoporosis Father  Deceased. at age 62 from prostate cancer, no known heart conditions Sister 1  In good health. 1- 3 years younger, no known heart conditions  Social History Anderson Malta Sergeant; 2016/06/05 11:16 AM) Current tobacco use  Former smoker. smoked 1.5 ppd for 50 years. Stopped smoking 05/2011 Marital status  Married. Alcohol Use  Occasional alcohol use. Number of Children  1. Living Situation  Lives with spouse.  Past Surgical History Anderson Malta Sergeant; 06-05-2016 11:16 AM) Nephrectomy 1994 Left. Kidney removed Hernia Repair 06/04/2011 3 Hernia's repaired Melanoma Excision 03/2016  Medication History Anderson Malta Sergeant; 06-05-16 11:27 AM) Eliquis (5MG Tablet, 1 (one) Tablet Oral two times daily, Taken starting 05/07/2016) Active. TraMADol HCl (50MG Tablet, 1 (one) Tablet Oral three times daily, as needed, Taken starting 04/27/2016) Active. Valsartan-Hydrochlorothiazide (320-25MG Tablet, 1 (one) Tablet Tablet Oral daily, Taken starting 09/24/2014) Active. (Disregard Losartan HCT. Efficacy) AmLODIPine Besylate (10MG Tablet, 1 Tablet Tablet Oral daily, Taken starting 08/22/2014) Active. (Disregard 40m Rx) Temazepam (15MG Capsule, 1 Oral at bedtime as needed) Active. Linzess (145MCG Capsule, 1 Oral daily) Active. Metoprolol Succinate ER (50MG Tablet ER  24HR, 1 Oral daily) Active. Furosemide (40MG Tablet, 1 Oral daily) Active. ROPINIRole HCl (0.5MG Tablet, 1 Oral daily) Active. Spiriva HandiHaler (18MCG Capsule, 1 Inhalation daily) Active. Atorvastatin Calcium (20MG Tablet, 1 Oral daily) Active. Symbicort (160-4.5MCG/ACT Aerosol, 2 puffs Inhalation two times daily) Active. Allopurinol (300MG Tablet, 1 Oral daily) Active. Daliresp (500MCG Tablet, 1 Oral daily) Active. O2 3 Liters  (1 as needed during the day & PM) Active. Fish Oil 1200 MG (1 three times daily) Active. One Day Multi Vitamin  (1 daily) Active. Stool Softener 250 MG (1 daily) Active. ProAir HFA (108 (90 Base)MCG/ACT Aerosol Soln, 2 puffs Inhalation every four hours as needed) Active. ALPRAZolam (0.5MG Tablet, 1 Oral two times daily) Active. ZyrTEC Allergy (10MG Tablet, 1 Oral daily) Active. Medications Reconciled (list present)  Diagnostic Studies History (Bridgette AEbony Hail AGNP-C; 05/20/2016 5:25 PM) Echocardiogram 05/07/2016 Left ventricle cavity is normal in size. Moderate concentric hypertrophy of the left ventricle. Hyperdynamic LV systolic function with cavitary obliteration. Suspect intraventricular pressure gradient. Doppler evidence of grade I (impaired) diastolic dysfunction. Visual EF is 65-70%. Left atrial cavity is severely dilated at 5.5cm. Moderate tricuspid regurgitation. Moderate pulmonary hypertension. Pulmonary artery systolic pressure is estimated at 454mHg. Insignificant pericardial effusion. IVC is dilated with respiratory variation. This suggests elevated right heart pressure. Compared to the study done on 02/17/2014, LA size was mildly dilated and Pul HTN new. Nuclear stress test 05/01/2016 1. The resting electrocardiogram demonstrated atrial fibrillation, normal resting conduction and nonspecific ST-T changes. Stress EKG is non-diagnostic for ischemia as it a pharmacologic stress using Lexiscan. Stress symptoms included dyspnea. 2. The  perfusion imaging study demonstrates mild diaphragmatic attenuation artifact in the inferior wall, there is no evidence of ischemia or scar. Left ventricle systolic function calculated by QGS was 86%. This is a low risk study. No significant change from 08/31/2014. Labwork  7/July 28, 2017Creatinine 1.34, eGFR 54, potassium 3.6 02/05/2016: Creatinine 1.4, eGFR  50, potassium 3.6, CMP normal 04/21/2016: proBNP 460 01/28/2015: serum glucose 108, creatinine 1.31, CMP otherwise normal, total cholesterol 125, triglycerides 107, HDL 34, LDL 70 07/30/2014: Serum glucose 108, BUN 20, creatinine 1.29, eGFR 55, CMP otherwise normal, total cholesterol 138, triglycerides 111, HDL 32, LDL 85, urine microalbumin 12.6 Colonoscopy 2005 Normal. removed 5 polys Carotid Doppler 09/26/2014 Minimal disease of the bilateral external carotid artery with < 50% stenosis. There is minimal evidence of heterogeneous plaque in the bilateral external carotid arteries. Follow up in one year is appropriate if clinically indicated. Chest X-ray 04/21/2016 1. Stable cardiomegaly. No pulmonary venous congestion. 2. Stable chronic interstitial changes consistent with interstitial fibrosis.    Review of Systems [facility] Ebony Hail, [place]; 05/20/2016 5:35 PM) General Present- Fatigue. Not Present- Anorexia and Fever. Respiratory Present- Decreased Exercise Tolerance and Difficulty Breathing on Exertion. Not Present- Cough. Cardiovascular Present- Chest Pain ("pressure") and Edema. Not Present- Claudications, Orthopnea, Palpitations and Paroxysmal Nocturnal Dyspnea. Gastrointestinal Not Present- Black, Tarry Stool, Change in Bowel Habits and Nausea. Neurological Not Present- Focal Neurological Symptoms and Syncope. Endocrine Not Present- Cold Intolerance, Excessive Sweating, Heat Intolerance and Thyroid Problems. Hematology Not Present- Anemia, Easy Bruising, Petechiae and Prolonged Bleeding.  Vitals Anderson Malta Sergeant; 05/19/2016 11:30  AM) 05/19/2016 11:19 AM Weight: 284 lb Height: 74in Body Surface Area: 2.52 m Body Mass Index: 36.46 kg/m  Pulse: 76 (Regular)  P.OX: 89% (Room air) BP: 110/60 (Sitting, Left Arm, Standard)       Physical Exam (Bridgette Ebony Hail AGNP-C; 05/20/2016 5:35 PM) General Mental Status-Alert. General Appearance-Cooperative, Appears stated age, Not in acute distress. Orientation-Oriented X3. Build & Nutrition-Well built and Moderately obese.  Head and Neck Thyroid Gland Characteristics - no palpable nodules, no palpable enlargement.  Chest and Lung Exam Inspection Shape - Barrel-shaped. Palpation Tender - No chest wall tenderness. Auscultation Breath sounds - Clear.  Cardiovascular Inspection Jugular vein - Right - No Distention. Auscultation Rhythm - Irregularly irregular. Heart Sounds - S1 is variable, S2 WNL and No gallop present. Note: Distant. Murmurs & Other Heart Sounds: Murmur - Location - Aortic Area and Apex. Timing - Mid-systolic. Grade - II/VI.  Abdomen Inspection Contour - Obese. Palpation/Percussion Normal exam - Non Tender and No hepatosplenomegaly. Auscultation Normal exam - Bowel sounds normal.  Peripheral Vascular Lower Extremity Inspection - Left - Pigmented, No Varicose veins. Right - Pigmented, No Varicose veins. Palpation - Edema - Bilateral - Trace edema. Femoral pulse - Left - Normal. Right - Normal. Popliteal pulse - Left - Normal. Right - Normal. Dorsalis pedis pulse - Left - Normal. Right - Normal. Posterior tibial pulse - Left - Normal. Right - Normal. Carotid arteries Carotid arteries - Bilateral-Soft Bruit. Abdomen-No prominent abdominal aortic pulsation, No epigastric bruit.  Neurologic Motor-Grossly intact without any focal deficits.  Musculoskeletal Global Assessment Left Lower Extremity - normal range of motion without pain and Edema present. Right Lower Extremity - normal range of motion without pain and Edema  present.    Assessment & Plan (Bridgette Ebony Hail AGNP-C; 05/20/2016 5:32 PM) New onset atrial fibrillation (I48.91) Story: CHA2DS2-VASc Score is 3 with yearly risk of stroke of 3.2%. HAS-Bled score is 1 and estimated major bleeding in one year is 1.02-1.5%.  Lexiscan myoview stress test 05/01/2016: 1. The resting electrocardiogram demonstrated atrial fibrillation, normal resting conduction and nonspecific ST-T changes. Stress EKG is non-diagnostic for ischemia as it a pharmacologic stress using Lexiscan. Stress symptoms included dyspnea. 2. The perfusion imaging study demonstrates mild diaphragmatic attenuation artifact in the inferior wall, there is  no evidence of ischemia or scar. Left ventricle systolic function calculated by QGS was 86%. This is a low risk study. No significant change from 08/31/2014.  Echocardiogram 05/07/2016: Left ventricle cavity is normal in size. Moderate concentric hypertrophy of the left ventricle. Hyperdynamic LV systolic function with cavitary obliteration. Suspect intraventricular pressure gradient. Doppler evidence of grade I (impaired) diastolic dysfunction. Visual EF is 65-70%. Left atrial cavity is severely dilated at 5.5cm. Moderate tricuspid regurgitation. Moderate pulmonary hypertension. Pulmonary artery systolic pressure is estimated at 57m Hg. Insignificant pericardial effusion. IVC is dilated with respiratory variation. This suggests elevated right heart pressure. Compared to the study done on 02/17/2014, LA size was mildly dilated and Pul HTN new. Impression: EKG 05/20/2016: Atrial fibrillation with controlled ventricular response at a rate of 83 bpm, normal axis, poor R-wave progression, cannot exclude anterior infarct old. Borderline low voltage complexes. PVC. No significant change from EKG on 04/27/2016. Current Plans METABOLIC PANEL, BASIC (872536 Started Flecainide Acetate 100MG, 1 (one) Tablet two times daily, #60, 05/20/2016, Ref. x1. Shortness  of breath at rest (R06.02) Story: Echo- 09/12/14 - Left ventricle cavity is normal in size. Moderate concentric hypertrophy of the left ventricle. Normal global wall motion. Doppler evidence of grade I (impaired) diastolic dysfunction with elevated LV filling pressure. Calculated EF 55%. - Left atrial cavity is mildly dilated. Mild inter atrial septal aneurysm. - Right ventricle cavity is normal in size. Mild concentric hypertrophy. Normal right ventricular function. - Structurally normal tricuspid valve with trace regurgitation. No evidence of pulmonary hypertension. Essential hypertension (I10) Single kidney (Z90.5) Story: Left nephrectomy 1994 due to renal cell carcinoma  Current Plans Mechanism of underlying disease process and action of medications discussed with the patient. I discussed primary/secondary prevention and also dietary counseling was done. He presents for follow-up of new onset atrial fibrillation. Stress test was negative for evidence of ischemia with EF of 86%. Echocardiogram revealed normal EF at 65-70% with severely dilated left atrium 5.5 cm. He remains in atrial fibrillation today, rate controlled. Discussed options for management of A. fib including rate control versus rhythm control. Patient would like to proceed with attempt at cardioversion. Given severely dilated left atrium, we will begin flecainide 100 mg twice a day to improve chances of successful conversion to sinus rhythm. He has been taking Eliquis as ordered without bleeding diathesis. Follow-up after cardioversion for reevaluation and further recommendations.  *I have discussed this case with Dr. GEinar Gipand he participated in formulating the plan.*  Signed by BNeldon Hall AGNP-C (05/20/2016 5:36 PM)

## 2016-05-26 ENCOUNTER — Ambulatory Visit (HOSPITAL_COMMUNITY)
Admission: RE | Admit: 2016-05-26 | Discharge: 2016-05-26 | Disposition: A | Discharge status: Home / Self Care | Payer: BLUE CROSS/BLUE SHIELD | Source: Ambulatory Visit | Attending: Cardiology | Admitting: Cardiology

## 2016-05-26 ENCOUNTER — Encounter (HOSPITAL_COMMUNITY)
Admission: RE | Disposition: A | Discharge status: Home / Self Care | Payer: Self-pay | Source: Ambulatory Visit | Attending: Cardiology

## 2016-05-26 ENCOUNTER — Ambulatory Visit: Payer: BLUE CROSS/BLUE SHIELD | Admitting: Adult Health

## 2016-05-26 ENCOUNTER — Encounter (HOSPITAL_COMMUNITY): Payer: Self-pay | Admitting: *Deleted

## 2016-05-26 ENCOUNTER — Encounter (HOSPITAL_COMMUNITY): Payer: Self-pay | Admitting: Anesthesiology

## 2016-05-26 ENCOUNTER — Encounter (HOSPITAL_COMMUNITY): Payer: Self-pay

## 2016-05-26 DIAGNOSIS — Z87891 Personal history of nicotine dependence: Secondary | ICD-10-CM | POA: Insufficient documentation

## 2016-05-26 DIAGNOSIS — Z85528 Personal history of other malignant neoplasm of kidney: Secondary | ICD-10-CM | POA: Diagnosis not present

## 2016-05-26 DIAGNOSIS — E785 Hyperlipidemia, unspecified: Secondary | ICD-10-CM | POA: Insufficient documentation

## 2016-05-26 DIAGNOSIS — I10 Essential (primary) hypertension: Secondary | ICD-10-CM | POA: Insufficient documentation

## 2016-05-26 DIAGNOSIS — Z9981 Dependence on supplemental oxygen: Secondary | ICD-10-CM | POA: Diagnosis not present

## 2016-05-26 DIAGNOSIS — Z905 Acquired absence of kidney: Secondary | ICD-10-CM | POA: Diagnosis not present

## 2016-05-26 DIAGNOSIS — J449 Chronic obstructive pulmonary disease, unspecified: Secondary | ICD-10-CM | POA: Insufficient documentation

## 2016-05-26 DIAGNOSIS — I4891 Unspecified atrial fibrillation: Secondary | ICD-10-CM | POA: Insufficient documentation

## 2016-05-26 SURGERY — CANCELLED PROCEDURE

## 2016-05-26 NOTE — Progress Notes (Signed)
Patient admitted to Endo unit and placed on monitor. Patient appeared to be in NSR. EKG obtained and confirmed with MD. Patient informed to keep follow up appointment and discharged home.

## 2016-05-28 ENCOUNTER — Encounter (HOSPITAL_COMMUNITY): Payer: Self-pay

## 2016-06-02 ENCOUNTER — Encounter (HOSPITAL_COMMUNITY)
Admission: RE | Admit: 2016-06-02 | Discharge: 2016-06-02 | Disposition: A | Discharge status: Home / Self Care | Payer: Self-pay | Source: Ambulatory Visit | Attending: Pulmonary Disease | Admitting: Pulmonary Disease

## 2016-06-04 ENCOUNTER — Encounter (HOSPITAL_COMMUNITY)
Admission: RE | Admit: 2016-06-04 | Discharge: 2016-06-04 | Disposition: A | Discharge status: Home / Self Care | Payer: Self-pay | Source: Ambulatory Visit | Attending: Pulmonary Disease | Admitting: Pulmonary Disease

## 2016-06-04 ENCOUNTER — Ambulatory Visit (INDEPENDENT_AMBULATORY_CARE_PROVIDER_SITE_OTHER): Payer: BLUE CROSS/BLUE SHIELD | Admitting: Adult Health

## 2016-06-04 ENCOUNTER — Encounter: Payer: Self-pay | Admitting: Adult Health

## 2016-06-04 DIAGNOSIS — I4891 Unspecified atrial fibrillation: Secondary | ICD-10-CM | POA: Diagnosis not present

## 2016-06-04 DIAGNOSIS — J9611 Chronic respiratory failure with hypoxia: Secondary | ICD-10-CM

## 2016-06-04 DIAGNOSIS — J439 Emphysema, unspecified: Secondary | ICD-10-CM

## 2016-06-04 NOTE — Progress Notes (Signed)
Subjective:    Patient ID: Harold Hall, male    DOB: 1945-12-03, 70 y.o.   MRN: [id number]  HPI 70 yo male former smoker with COPD  Stopped smoking 2012  TEST [name] 2007 FEV1 46%, ratio 47.  Pulmonary rehab 2012 , 2017   06/04/2016 Follow up : COPD  Pt returns for 1 month follow up .  Says he is feeling better. Was dx with Atrial fib last ov , referred to cardiology .  Had episode of A Fib w /RVR last week, went to ER for possible cardioversion . He converted on his on and was discharged . Now on Eliquis and Tambocor and metoprolol .  Remains on Symbicort , Daliresp , and Spiriva .  On Oxygen 4 l/m with act and 3l/m At bedtime   No flare of cough , wheezing or edema    Past Medical History:  Diagnosis Date  . Allergic rhinitis   . Arthritis   . Cancer [facility])    left renal -solitary kidney  . COPD (chronic obstructive pulmonary disease) ([place])   . Erectile dysfunction   . Gout   . Hernia   . History of kidney cancer   . Hypercholesterolemia   . Hypertension   . RLS (restless legs syndrome)   . Sinus problem    Current Outpatient Prescriptions on File Prior to Visit  Medication Sig Dispense Refill  . albuterol (PROAIR HFA) 108 (90 BASE) MCG/ACT inhaler Inhale 2 puffs into the lungs every 4 (four) hours as needed for wheezing or shortness of breath. 3 Inhaler 3  . allopurinol (ZYLOPRIM) 300 MG tablet Take 300 mg by mouth daily.    Marland Kitchen ALPRAZolam (XANAX) 0.5 MG tablet Take 1 tablet (0.5 mg total) by mouth 2 (two) times daily. 60 tablet 0  . amLODipine (NORVASC) 10 MG tablet Take 10 mg by mouth daily.     Marland Kitchen atorvastatin (LIPITOR) 20 MG tablet Take 20 mg by mouth daily.    Marland Kitchen azelastine (ASTELIN) 0.1 % nasal spray Place 1 spray into both nostrils 2 (two) times daily. Once daily  2  . budesonide-formoterol (SYMBICORT) 160-4.5 MCG/ACT inhaler INHALE 2 PUFFS INTO THE LUNGS TWICE A DAY 30.6 g 3  . ELIQUIS 5 MG TABS tablet Take 5 mg by mouth 2 (two) times daily.  0  .  fish oil-omega-3 fatty acids 1000 MG capsule Take 1 capsule by mouth 3 (three) times daily.      . flecainide (TAMBOCOR) 100 MG tablet Take 100 mg by mouth 2 (two) times daily.  0  . furosemide (LASIX) 40 MG tablet Take 40 mg by mouth daily.     . Linaclotide (LINZESS) 145 MCG CAPS capsule Take 145 mcg by mouth daily.    . metoprolol succinate (TOPROL-XL) 50 MG 24 hr tablet Take 50 mg by mouth daily. Take with or immediately following a meal.    . metroNIDAZOLE (METROGEL) 0.75 % gel apply to affected area once daily  0  . Multiple Vitamin (MULITIVITAMIN WITH MINERALS) TABS Take 1 tablet by mouth daily.    . roflumilast (DALIRESP) 500 MCG TABS tablet Take 1 tablet (500 mcg total) by mouth daily. 90 tablet 3  . rOPINIRole (REQUIP) 0.5 MG tablet Take 1 tablet (0.5 mg total) by mouth daily. 30 tablet 0  . temazepam (RESTORIL) 15 MG capsule Take 15 mg by mouth at bedtime as needed for sleep.     Marland Kitchen tiotropium (SPIRIVA HANDIHALER) 18 MCG inhalation capsule INHALE  THE CONTENTS OF 1 CAPSULE DAILY 90 capsule 3  . traMADol (ULTRAM) 50 MG tablet Take 50 mg by mouth 3 (three) times daily as needed for moderate pain.   0  . valsartan-hydrochlorothiazide (DIOVAN-HCT) 320-25 MG per tablet Take 1 tablet by mouth daily.     No current facility-administered medications on file prior to visit.     Review of Systems Constitutional:   No  weight loss, night sweats,  Fevers, chills,  +fatigue, or  lassitude.  HEENT:   No headaches,  Difficulty swallowing,  Tooth/dental problems, or  Sore throat,                No sneezing, itching, ear ache, nasal congestion, post nasal drip,   CV:  No chest pain,  Orthopnea, PND, , anasarca, dizziness, palpitations, syncope.   GI  No heartburn, indigestion, abdominal pain, nausea, vomiting, diarrhea, change in bowel habits, loss of appetite, bloody stools.   Resp:   No chest wall deformity  Skin: no rash or lesions.  GU: no dysuria, change in color of urine, no urgency or  frequency.  No flank pain, no hematuria   MS:  No joint pain or swelling.  No decreased range of motion.  No back pain.  Psych:  No change in mood or affect. No depression or anxiety.  No memory loss.         Objective:   Physical Exam Vitals:   06/04/16 1541  BP: 116/62  Pulse: 66  Temp: 98.4 F (36.9 C)  TempSrc: Oral  SpO2: 92%  Weight: 287 lb (130.2 kg)  Height: 6\' 2"  (1.88 m)   GEN: A/Ox3; pleasant , NAD, obese    HEENT:  [place]/AT,  EACs-clear, TMs-wnl, NOSE-clear, THROAT-clear, no lesions, no postnasal drip or exudate noted.   NECK:  Supple w/ fair ROM; no JVD; normal carotid impulses w/o bruits; no thyromegaly or nodules palpated; no lymphadenopathy.    RESP  Clear  P & A; w/o, wheezes/ rales/ or rhonchi. no accessory muscle use, no dullness to percussion  CARD:  RRR, no m/r/g  , tr  peripheral edema, pulses intact, no cyanosis or clubbing.  GI:   Soft & nt; nml bowel sounds; no organomegaly or masses detected.   Musco: Warm bil, no deformities or joint swelling noted.   Neuro: alert, no focal deficits noted.    Skin: Warm, no lesions or rashes  Tammy Parrett NP-C  [facility]  06/04/2016        Assessment & Plan:

## 2016-06-04 NOTE — Assessment & Plan Note (Signed)
Improved and converted into NSR  Cont w. follow up with Cardiology

## 2016-06-04 NOTE — Patient Instructions (Signed)
Continue on current regimen  Follow up with Dr. Lake Bells in 4 months and as needed.

## 2016-06-04 NOTE — Assessment & Plan Note (Signed)
Cont on O2 .  

## 2016-06-04 NOTE — Assessment & Plan Note (Signed)
Controled on rx without flare   Plan  Continue on current regimen  Follow up with Dr. Lake Bells in 4 months and as needed.

## 2016-06-09 ENCOUNTER — Encounter (HOSPITAL_COMMUNITY)
Admission: RE | Admit: 2016-06-09 | Discharge: 2016-06-09 | Disposition: A | Discharge status: Home / Self Care | Payer: Self-pay | Source: Ambulatory Visit | Attending: Internal Medicine | Admitting: Internal Medicine

## 2016-06-09 DIAGNOSIS — J439 Emphysema, unspecified: Secondary | ICD-10-CM | POA: Insufficient documentation

## 2016-06-09 DIAGNOSIS — J9611 Chronic respiratory failure with hypoxia: Secondary | ICD-10-CM | POA: Insufficient documentation

## 2016-06-09 DIAGNOSIS — I10 Essential (primary) hypertension: Secondary | ICD-10-CM | POA: Insufficient documentation

## 2016-06-11 ENCOUNTER — Encounter (HOSPITAL_COMMUNITY): Payer: Self-pay

## 2016-06-16 ENCOUNTER — Encounter (HOSPITAL_COMMUNITY): Payer: Self-pay

## 2016-06-18 ENCOUNTER — Encounter (HOSPITAL_COMMUNITY)
Admission: RE | Admit: 2016-06-18 | Discharge: 2016-06-18 | Disposition: A | Discharge status: Home / Self Care | Payer: Self-pay | Source: Ambulatory Visit | Attending: Pulmonary Disease | Admitting: Pulmonary Disease

## 2016-06-23 ENCOUNTER — Encounter (HOSPITAL_COMMUNITY)
Admission: RE | Admit: 2016-06-23 | Discharge: 2016-06-23 | Disposition: A | Discharge status: Home / Self Care | Payer: Self-pay | Source: Ambulatory Visit | Attending: Pulmonary Disease | Admitting: Pulmonary Disease

## 2016-06-25 ENCOUNTER — Encounter (HOSPITAL_COMMUNITY)
Admission: RE | Admit: 2016-06-25 | Discharge: 2016-06-25 | Disposition: A | Discharge status: Home / Self Care | Payer: Self-pay | Source: Ambulatory Visit | Attending: Pulmonary Disease | Admitting: Pulmonary Disease

## 2016-06-30 ENCOUNTER — Encounter (HOSPITAL_COMMUNITY): Payer: Self-pay

## 2016-07-02 ENCOUNTER — Encounter (HOSPITAL_COMMUNITY): Payer: Self-pay

## 2016-07-07 ENCOUNTER — Encounter (HOSPITAL_COMMUNITY)
Admission: RE | Admit: 2016-07-07 | Discharge: 2016-07-07 | Disposition: A | Discharge status: Home / Self Care | Payer: Self-pay | Source: Ambulatory Visit | Attending: Pulmonary Disease | Admitting: Pulmonary Disease

## 2016-07-09 ENCOUNTER — Encounter (HOSPITAL_COMMUNITY)
Admission: RE | Admit: 2016-07-09 | Discharge: 2016-07-09 | Disposition: A | Discharge status: Home / Self Care | Payer: Self-pay | Source: Ambulatory Visit | Attending: Pulmonary Disease | Admitting: Pulmonary Disease

## 2016-07-14 ENCOUNTER — Encounter (HOSPITAL_COMMUNITY)
Admission: RE | Admit: 2016-07-14 | Discharge: 2016-07-14 | Disposition: A | Discharge status: Home / Self Care | Payer: Self-pay | Source: Ambulatory Visit | Attending: Internal Medicine | Admitting: Internal Medicine

## 2016-07-14 DIAGNOSIS — J9611 Chronic respiratory failure with hypoxia: Secondary | ICD-10-CM | POA: Insufficient documentation

## 2016-07-14 DIAGNOSIS — I10 Essential (primary) hypertension: Secondary | ICD-10-CM | POA: Insufficient documentation

## 2016-07-14 DIAGNOSIS — J439 Emphysema, unspecified: Secondary | ICD-10-CM | POA: Insufficient documentation

## 2016-07-16 ENCOUNTER — Encounter (HOSPITAL_COMMUNITY)
Admission: RE | Admit: 2016-07-16 | Discharge: 2016-07-16 | Disposition: A | Discharge status: Home / Self Care | Payer: Self-pay | Source: Ambulatory Visit | Attending: Pulmonary Disease | Admitting: Pulmonary Disease

## 2016-07-21 ENCOUNTER — Encounter (HOSPITAL_COMMUNITY): Payer: Self-pay

## 2016-07-23 ENCOUNTER — Encounter (HOSPITAL_COMMUNITY): Payer: Self-pay

## 2016-07-28 ENCOUNTER — Encounter (HOSPITAL_COMMUNITY): Payer: Self-pay

## 2016-07-28 ENCOUNTER — Ambulatory Visit (INDEPENDENT_AMBULATORY_CARE_PROVIDER_SITE_OTHER): Payer: BLUE CROSS/BLUE SHIELD | Admitting: Adult Health

## 2016-07-28 ENCOUNTER — Encounter: Payer: Self-pay | Admitting: Adult Health

## 2016-07-28 DIAGNOSIS — J439 Emphysema, unspecified: Secondary | ICD-10-CM

## 2016-07-28 MED ORDER — DOXYCYCLINE HYCLATE 100 MG PO TABS: 100.0000 mg | ORAL_TABLET | Freq: Two times a day (BID) | ORAL | 0 refills | Status: DC

## 2016-07-28 NOTE — Progress Notes (Signed)
Subjective:    Patient ID: Harold Hall, male    DOB: 07/05/1946, 70 y.o.   MRN: [id number]  HPI 70 yo male former smoker with COPD  Stopped smoking 2012  TEST [name] 2007 FEV1 46%, ratio 47.  PFT 04/2016 FEV1 46, ratio 47, FVC 73%, DLCO 34%.  Pulmonary rehab 2012 , 2017   07/28/2016 Acute OV : COPD  Pt presents for an acute office visit. Complains 1-2 weeks of increased cough, congestion with thick yellow green mucus and doe. No chest pain , orthopnea, increased edema or fever.  Good appetite. Legs swelling is doing good. Wt is down 5lbs.  Going to pulmonary rehab, really likes it.  Wife has similar sx with cough .   Remains on Symbicort , Daliresp , and Spiriva .  On Oxygen 4 l/m with act and 3l/m At bedtime   No flare of cough , wheezing or edema   Last cxr in June showed chronic interstitial changes .   Says he may have had Gout flare -joint inflammation in right foot, seen by PCP , given pred taper . Sx resolved he says.   Says he just bought a [employer] . Going [place] the beach , he is excited.   Past Medical History:  Diagnosis Date  . Allergic rhinitis   . Arthritis   . Cancer [facility])    left renal -solitary kidney  . COPD (chronic obstructive pulmonary disease) ([place])   . Erectile dysfunction   . Gout   . Hernia   . History of kidney cancer   . Hypercholesterolemia   . Hypertension   . RLS (restless legs syndrome)   . Sinus problem    Current Outpatient Prescriptions on File Prior to Visit  Medication Sig Dispense Refill  . albuterol (PROAIR HFA) 108 (90 BASE) MCG/ACT inhaler Inhale 2 puffs into the lungs every 4 (four) hours as needed for wheezing or shortness of breath. 3 Inhaler 3  . allopurinol (ZYLOPRIM) 300 MG tablet Take 300 mg by mouth daily.    Marland Kitchen ALPRAZolam (XANAX) 0.5 MG tablet Take 1 tablet (0.5 mg total) by mouth 2 (two) times daily. 60 tablet 0  . amLODipine (NORVASC) 10 MG tablet Take 10 mg by mouth daily.     Marland Kitchen atorvastatin  (LIPITOR) 20 MG tablet Take 20 mg by mouth daily.    Marland Kitchen azelastine (ASTELIN) 0.1 % nasal spray Place 1 spray into both nostrils 2 (two) times daily. Once daily  2  . budesonide-formoterol (SYMBICORT) 160-4.5 MCG/ACT inhaler INHALE 2 PUFFS INTO THE LUNGS TWICE A DAY 30.6 g 3  . ELIQUIS 5 MG TABS tablet Take 5 mg by mouth 2 (two) times daily.  0  . fish oil-omega-3 fatty acids 1000 MG capsule Take 1 capsule by mouth 3 (three) times daily.      . flecainide (TAMBOCOR) 100 MG tablet Take 100 mg by mouth 2 (two) times daily.  0  . furosemide (LASIX) 40 MG tablet Take 40 mg by mouth daily.     . Linaclotide (LINZESS) 145 MCG CAPS capsule Take 145 mcg by mouth daily.    . metoprolol succinate (TOPROL-XL) 50 MG 24 hr tablet Take 50 mg by mouth daily. Take with or immediately following a meal.    . metroNIDAZOLE (METROGEL) 0.75 % gel apply to affected area once daily  0  . Multiple Vitamin (MULITIVITAMIN WITH MINERALS) TABS Take 1 tablet by mouth daily.    . roflumilast (DALIRESP) 500  MCG TABS tablet Take 1 tablet (500 mcg total) by mouth daily. 90 tablet 3  . rOPINIRole (REQUIP) 0.5 MG tablet Take 1 tablet (0.5 mg total) by mouth daily. 30 tablet 0  . temazepam (RESTORIL) 15 MG capsule Take 15 mg by mouth at bedtime as needed for sleep.     Marland Kitchen tiotropium (SPIRIVA HANDIHALER) 18 MCG inhalation capsule INHALE THE CONTENTS OF 1 CAPSULE DAILY 90 capsule 3  . traMADol (ULTRAM) 50 MG tablet Take 50 mg by mouth 3 (three) times daily as needed for moderate pain.   0  . valsartan-hydrochlorothiazide (DIOVAN-HCT) 320-25 MG per tablet Take 1 tablet by mouth daily.     No current facility-administered medications on file prior to visit.     Review of Systems Constitutional:   No  weight loss, night sweats,  Fevers, chills,  +fatigue, or  lassitude.  HEENT:   No headaches,  Difficulty swallowing,  Tooth/dental problems, or  Sore throat,                No sneezing, itching, ear ache, nasal congestion, post nasal  drip,   CV:  No chest pain,  Orthopnea, PND, , anasarca, dizziness, palpitations, syncope.   GI  No heartburn, indigestion, abdominal pain, nausea, vomiting, diarrhea, change in bowel habits, loss of appetite, bloody stools.   Resp:   No chest wall deformity  Skin: no rash or lesions.  GU: no dysuria, change in color of urine, no urgency or frequency.  No flank pain, no hematuria   MS:  No joint pain or swelling.  No decreased range of motion.  No back pain.  Psych:  No change in mood or affect. No depression or anxiety.  No memory loss.         Objective:   Physical Exam Vitals:   07/28/16 1138  BP: 136/86  Pulse: 65  Temp: 98.5 F (36.9 C)  TempSrc: Oral  SpO2: 94%  Weight: 283 lb (128.4 kg)  Height: 6\' 2"  (1.88 m)   GEN: A/Ox3; pleasant , NAD, obese    HEENT:  [place]/AT,  EACs-clear, TMs-wnl, NOSE-clear, THROAT-clear, no lesions, no postnasal drip or exudate noted.   NECK:  Supple w/ fair ROM; no JVD; normal carotid impulses w/o bruits; no thyromegaly or nodules palpated; no lymphadenopathy.    RESP  Clear  P & A; w/o, wheezes/ rales/ or rhonchi. no accessory muscle use, no dullness to percussion  CARD:  RRR, no m/r/g  , tr  peripheral edema, pulses intact, no cyanosis or clubbing.  GI:   Soft & nt; nml bowel sounds; no organomegaly or masses detected.   Musco: Warm bil, no deformities or joint swelling noted.   Neuro: alert, no focal deficits noted.    Skin: Warm, no lesions or rashes  Wilber Fini NP-C  [facility]  07/28/2016        Assessment & Plan:

## 2016-07-28 NOTE — Patient Instructions (Addendum)
Doxycycline 100mg  Twice daily  , take with food. Use sunscreen .  Mucinex DM Twice daily As needed  Cough /congestion  Continue on Symbicort and Spiriva .  Wear Oxygen 3-4 l/m .  Follow up Dr. Lake Bells as planned and As needed   Please contact office for sooner follow up if symptoms do not improve or worsen or seek emergency care

## 2016-07-28 NOTE — Assessment & Plan Note (Signed)
Flare   Plan  Patient Instructions  Doxycycline 100mg  Twice daily  , take with food. Use sunscreen .  Mucinex DM Twice daily As needed  Cough /congestion  Continue on Symbicort and Spiriva .  Wear Oxygen 3-4 l/m .  Follow up Dr. Lake Bells as planned and As needed   Please contact office for sooner follow up if symptoms do not improve or worsen or seek emergency care

## 2016-07-30 ENCOUNTER — Encounter (HOSPITAL_COMMUNITY): Payer: Self-pay

## 2016-08-01 NOTE — Progress Notes (Signed)
Reviewed, agree with this plan of care 

## 2016-08-04 ENCOUNTER — Encounter (HOSPITAL_COMMUNITY): Admission: RE | Admit: 2016-08-04 | Payer: Self-pay | Source: Ambulatory Visit

## 2016-08-05 ENCOUNTER — Ambulatory Visit (INDEPENDENT_AMBULATORY_CARE_PROVIDER_SITE_OTHER): Payer: BLUE CROSS/BLUE SHIELD | Admitting: Pulmonary Disease

## 2016-08-05 ENCOUNTER — Telehealth: Payer: Self-pay | Admitting: Pulmonary Disease

## 2016-08-05 ENCOUNTER — Ambulatory Visit (INDEPENDENT_AMBULATORY_CARE_PROVIDER_SITE_OTHER)
Admission: RE | Admit: 2016-08-05 | Discharge: 2016-08-05 | Disposition: A | Discharge status: Home / Self Care | Payer: BLUE CROSS/BLUE SHIELD | Source: Ambulatory Visit | Attending: Pulmonary Disease | Admitting: Pulmonary Disease

## 2016-08-05 ENCOUNTER — Encounter: Payer: Self-pay | Admitting: Pulmonary Disease

## 2016-08-05 DIAGNOSIS — J441 Chronic obstructive pulmonary disease with (acute) exacerbation: Secondary | ICD-10-CM

## 2016-08-05 MED ORDER — PREDNISONE 10 MG PO TABS: ORAL_TABLET | ORAL | 0 refills | Status: DC

## 2016-08-05 MED ORDER — POTASSIUM CHLORIDE 20 MEQ PO PACK: 20.0000 meq | PACK | Freq: Every day | ORAL | 0 refills | Status: DC

## 2016-08-05 NOTE — Assessment & Plan Note (Signed)
COPD Flare Plan: Wear oxygen on continuous mode only for the next week. Wear continuous oxygen at 3-4 L with goal saturations greater than 90% Call if you do not start to improve.

## 2016-08-05 NOTE — Progress Notes (Signed)
History of Present Illness Harold Hall is a 70 y.o. male former ( Quit 2012)  smoker with emphysema, COPD on oxygen and PAH ( per CT) followed by Harold Hall.   08/05/2016 Acute OV: Pt was seen in the office 07/28/2016 for complaint of 1-2 weeks of increased cough, congestion with thick yellow green mucous and DOE.No flare of  wheezing or edema at the time. He is compliant with his Symbicort, Daliresp , and Spiriva . He is On Oxygen 4 l/m with act and 3l/m At bedtime    He was treated with Doxycycline ,and  Mucinex. He presents today with  Worsening DOE, cough with white to yellow secretions,. He was recently treated with prednisone for swelling of the right foot by his PCP ( 9/3), and states he did not get any better after treatment with Doxycycline ( 9/19). His saturations were 81% after ambulating in the office today. He rebounded quickly to 94% with conversion to continuous oxygen.He states he still has a cough and congestion in his lungs. He has no energy.He denies fever, chest pain, orthopnea or hemoptysis. He has significant edema  bilaterally to his lower extremities.    TEST Harold Hall  Harold Hall Hall FEV1 46%, ratio 47.  PFT 04/2016 FEV1 46, ratio 47, FVC 73%, DLCO 34%.  Pulmonary rehab 2012 , 2017   Past medical hx Past Medical History:  Diagnosis Date  . Allergic rhinitis   . Arthritis   . Cancer [facility])    left renal -solitary kidney  . COPD (chronic obstructive pulmonary disease) ([place])   . Erectile dysfunction   . Gout   . Hernia   . History of kidney cancer   . Hypercholesterolemia   . Hypertension   . RLS (restless legs syndrome)   . Sinus problem      Past surgical hx, Family hx, Social hx all reviewed.  Current Outpatient Prescriptions on File Prior to Visit  Medication Sig  . albuterol (PROAIR HFA) 108 (90 BASE) MCG/ACT inhaler Inhale 2 puffs into the lungs every 4 (four) hours as needed for wheezing or shortness of breath.  . allopurinol (ZYLOPRIM) 300 MG tablet  Take 300 mg by mouth daily.  Marland Kitchen ALPRAZolam (XANAX) 0.5 MG tablet Take 1 tablet (0.5 mg total) by mouth 2 (two) times daily.  Marland Kitchen amLODipine (NORVASC) 10 MG tablet Take 10 mg by mouth daily.   Marland Kitchen atorvastatin (LIPITOR) 20 MG tablet Take 20 mg by mouth daily.  Marland Kitchen azelastine (ASTELIN) 0.1 % nasal spray Place 1 spray into both nostrils 2 (two) times daily. Once daily  . budesonide-formoterol (SYMBICORT) 160-4.5 MCG/ACT inhaler INHALE 2 PUFFS INTO THE LUNGS TWICE A DAY  . ELIQUIS 5 MG TABS tablet Take 5 mg by mouth 2 (two) times daily.  . fish oil-omega-3 fatty acids 1000 MG capsule Take 1 capsule by mouth 3 (three) times daily.    . flecainide (TAMBOCOR) 100 MG tablet Take 100 mg by mouth 2 (two) times daily.  . furosemide (LASIX) 40 MG tablet Take 40 mg by mouth daily.   . Linaclotide (LINZESS) 145 MCG CAPS capsule Take 145 mcg by mouth daily.  . metoprolol succinate (TOPROL-XL) 50 MG 24 hr tablet Take 50 mg by mouth daily. Take with or immediately following a meal.  . metroNIDAZOLE (METROGEL) 0.75 % gel apply to affected area once daily  . Multiple Vitamin (MULITIVITAMIN WITH MINERALS) TABS Take 1 tablet by mouth daily.  . roflumilast (DALIRESP) 500 MCG TABS tablet Take 1 tablet (500  mcg total) by mouth daily.  Marland Kitchen rOPINIRole (REQUIP) 0.5 MG tablet Take 1 tablet (0.5 mg total) by mouth daily.  . temazepam (RESTORIL) 15 MG capsule Take 15 mg by mouth at bedtime as needed for sleep.   Marland Kitchen tiotropium (SPIRIVA HANDIHALER) 18 MCG inhalation capsule INHALE THE CONTENTS OF 1 CAPSULE DAILY  . traMADol (ULTRAM) 50 MG tablet Take 50 mg by mouth 3 (three) times daily as needed for moderate pain.   . valsartan-hydrochlorothiazide (DIOVAN-HCT) 320-25 MG per tablet Take 1 tablet by mouth daily.   No current facility-administered medications on file prior to visit.      No Known Allergies  Review Of Systems:  Constitutional:   No  weight loss, night sweats,  Fevers, chills,+ fatigue, or  lassitude.  HEENT:   No  headaches,  Difficulty swallowing,  Tooth/dental problems, or  Sore throat,                No sneezing, itching, ear ache, nasal congestion, post nasal drip,   CV:  No chest pain,  Orthopnea, PND,+  swelling in lower extremities, no anasarca, dizziness, palpitations, syncope.   GI  No heartburn, indigestion, abdominal pain, nausea, vomiting, diarrhea, change in bowel habits, loss of appetite, bloody stools.   Resp: + shortness of breath with exertion or at rest.  + excess mucus, + productive cough,  No non-productive cough,  No coughing up of blood.  + change in color of mucus.  No wheezing.  No chest wall deformity  Skin: no rash or lesions.  GU: no dysuria, change in color of urine, no urgency or frequency.  No flank pain, no hematuria   MS:  + joint pain or swelling ( ankle).  No decreased range of motion.  No back pain.  Psych:  No change in mood or affect. No depression or anxiety.  No memory loss.   Vital Signs BP 134/76 (BP Location: Left Arm, Cuff Size: Normal)   Pulse 67   Ht 6\' 2"  (1.88 m)   Wt 279 lb (126.6 kg)   SpO2 94%   BMI 35.82 kg/m    Physical Exam:  General- No distress,  A&Ox3, obese, pleasant ENT: No sinus tenderness, TM clear, pale nasal mucosa, no oral exudate,no post nasal drip, no LAN Cardiac: S1, S2, regular rate and rhythm, no murmur Chest: No wheeze/ rales/ diminished breath sounds bilaterally, + accessory muscle use, no nasal flaring, no sternal retractions Abd.: Soft Non-tender Ext: No clubbing cyanosis, edema Neuro:  deconditioned Skin: No rashes, warm and dry Psych: normal mood and behavior   Assessment/Plan  COPD exacerbation Slow to resolve Exacerbation of COPD Plan: We will  Get a  copy of 2D Echo from Harold Hall Office CXR today We will call you with results. Prednisone taper; 10 mg tablets: 4 tabs x 2 days, 3 tabs x 2 days, 2 tabs x 2 days 1 tab x 2 days then stop. Lasix ( Furosemide) 40 mg twice daily for 3 days. Potassium 20  MEq daily x 3 days  Symbicort, Daliresp , and Spiriva .  Wear only your continuous oxygen at 3-4 Liters to maintain saturations at greater than 90%. Follow up in 1 week with NP. Please contact office for sooner follow up if symptoms do not improve or worsen or seek emergency care  We will check labs upon follow up in 1 week. ( BMET)     Chronic respiratory failure with hypoxia (HCC) COPD Flare Plan: Wear oxygen on continuous mode  only for the next week. Wear continuous oxygen at 3-4 L with goal saturations greater than 90% Call if you do not start to improve.    Magdalen Spatz, NP 08/05/2016  4:56 PM   Ex-smoker with severe emphysema, DLCO is reduced at 34% while FEV1 is about 46%. I have reviewed his prior CT scan which also suggest pulmonary hypertension, echo Report is not available (dr Einar Gip). He was recently treated for an episode of bronchitis and doxycycline and steroid taper, his sputum has changedto clear , but he seems more hypoxic today  Requiring 4-5 L continuous oxygen rather than his usual 3 L pulse. Exam-decreased breath sounds bilateral, no rhonchi, 2+ bipedal edema, S1 and S2 normal no S3  Recommend- we will provide him with continuous oxygen Increase Lasix 40 twice a day for 3-5 days with potassium, return in one week for recheck Prednisone taper, chest x-ray Does not show pneumonia but shows vague opacity right upper lobe-will imaging with CT scan  Rigoberto Noel MD

## 2016-08-05 NOTE — Telephone Encounter (Signed)
Patient calling to be seen today.  Has SOB, cough, chest congestion.  Scheduled patient to see Dr. Elsworth Soho at 4:!5pm today.  Patient aware of appointment. Nothing further needed.

## 2016-08-05 NOTE — Patient Instructions (Addendum)
It is nice to meet you today. We will  Get a  copy of 2D Echo from Dr. Andria Meuse Office CXR today We will call you with results. Prednisone taper; 10 mg tablets: 4 tabs x 2 days, 3 tabs x 2 days, 2 tabs x 2 days 1 tab x 2 days then stop. Lasix ( Furosemide) 40 mg twice daily for 3 days. Potassium 20 MEq daily x 3 days  Symbicort, Daliresp , and Spiriva .  Wear only your continuous oxygen at 3-4 Liters to maintain saturations at greater than 90%. Follow up in 1 week with NP. Please contact office for sooner follow up if symptoms do not improve or worsen or seek emergency care  We will check labs upon follow up in 1 week. ( BMET)

## 2016-08-05 NOTE — Assessment & Plan Note (Signed)
Slow to resolve Exacerbation of COPD Plan: We will  Get a  copy of 2D Echo from Dr. Andria Meuse Office CXR today We will call you with results. Prednisone taper; 10 mg tablets: 4 tabs x 2 days, 3 tabs x 2 days, 2 tabs x 2 days 1 tab x 2 days then stop. Lasix ( Furosemide) 40 mg twice daily for 3 days. Potassium 20 MEq daily x 3 days  Symbicort, Daliresp , and Spiriva .  Wear only your continuous oxygen at 3-4 Liters to maintain saturations at greater than 90%. Follow up in 1 week with NP. Please contact office for sooner follow up if symptoms do not improve or worsen or seek emergency care  We will check labs upon follow up in 1 week. ( BMET)

## 2016-08-06 ENCOUNTER — Encounter (HOSPITAL_COMMUNITY): Payer: Self-pay

## 2016-08-06 NOTE — Progress Notes (Signed)
thanks

## 2016-08-11 ENCOUNTER — Encounter (HOSPITAL_COMMUNITY)
Admission: RE | Admit: 2016-08-11 | Discharge: 2016-08-11 | Disposition: A | Discharge status: Home / Self Care | Payer: Self-pay | Source: Ambulatory Visit | Attending: Internal Medicine | Admitting: Internal Medicine

## 2016-08-11 DIAGNOSIS — J439 Emphysema, unspecified: Secondary | ICD-10-CM | POA: Insufficient documentation

## 2016-08-11 DIAGNOSIS — I10 Essential (primary) hypertension: Secondary | ICD-10-CM | POA: Insufficient documentation

## 2016-08-11 DIAGNOSIS — J9611 Chronic respiratory failure with hypoxia: Secondary | ICD-10-CM | POA: Insufficient documentation

## 2016-08-13 ENCOUNTER — Ambulatory Visit (INDEPENDENT_AMBULATORY_CARE_PROVIDER_SITE_OTHER): Payer: BLUE CROSS/BLUE SHIELD | Admitting: Adult Health

## 2016-08-13 ENCOUNTER — Encounter (HOSPITAL_COMMUNITY)
Admission: RE | Admit: 2016-08-13 | Discharge: 2016-08-13 | Disposition: A | Discharge status: Home / Self Care | Payer: Self-pay | Source: Ambulatory Visit | Attending: Pulmonary Disease | Admitting: Pulmonary Disease

## 2016-08-13 ENCOUNTER — Encounter: Payer: Self-pay | Admitting: Adult Health

## 2016-08-13 DIAGNOSIS — Z23 Encounter for immunization: Secondary | ICD-10-CM

## 2016-08-13 DIAGNOSIS — J9611 Chronic respiratory failure with hypoxia: Secondary | ICD-10-CM | POA: Diagnosis not present

## 2016-08-13 DIAGNOSIS — J441 Chronic obstructive pulmonary disease with (acute) exacerbation: Secondary | ICD-10-CM | POA: Diagnosis not present

## 2016-08-13 NOTE — Patient Instructions (Signed)
Flu shot today .  Continue on current regimen  Follow up  Dr. Lake Bells next month as planned

## 2016-08-13 NOTE — Progress Notes (Signed)
Subjective:    Patient ID: Harold Hall, male    DOB: April 16, 1946, 70 y.o.   MRN: [id number]  HPI 70 yo male former smoker with COPD GOLD III  Stopped smoking 2012 S/p left nephrectomy (kidney cancer)   TEST [name] 2007 FEV1 46%, ratio 47.  PFT 04/2016 FEV1 46, ratio 47, FVC 73%, DLCO 34%.  Pulmonary rehab 2012 , 2017   08/13/2016 Follow up  : COPD  Pt returns for a 1 week follow up . He has been having a slow to resolve COPD flare. He was tx w/ Doxycycline and steroid taper . Had minimal improvement , came back to office on 9/27 , lasix was increased 40mg  Twice daily  X 3 days . And given prednisone taper . He returns today much improved says he is feeling much better. Cough and dyspnea are decreased. No fever , chest pain, orthopnea or increased edema. CXR last ov showed RUL opacity ? Scarring . Recommended to have CT chest . He prefers to wait, wants to discuss with Dr. Sherrin Daisy on return ov next month. No weight loss or hemoptysis .    Remains on Symbicort , Daliresp , and Spiriva .  On Oxygen 4 l/m with act and 3l/m At bedtime   No flare of cough , wheezing or edema    Says he just bought a camper . [place] , [place]. Going there next week on vacation for early birthday trip .   Seen by PCP this week with labs and told everything was good.    Past Medical History:  Diagnosis Date  . Allergic rhinitis   . Arthritis   . Cancer [facility])    left renal -solitary kidney  . COPD (chronic obstructive pulmonary disease) ([place])   . Erectile dysfunction   . Gout   . Hernia   . History of kidney cancer   . Hypercholesterolemia   . Hypertension   . RLS (restless legs syndrome)   . Sinus problem    Current Outpatient Prescriptions on File Prior to Visit  Medication Sig Dispense Refill  . albuterol (PROAIR HFA) 108 (90 BASE) MCG/ACT inhaler Inhale 2 puffs into the lungs every 4 (four) hours as needed for wheezing or shortness of breath. 3 Inhaler  3  . allopurinol (ZYLOPRIM) 300 MG tablet Take 300 mg by mouth daily.    Marland Kitchen ALPRAZolam (XANAX) 0.5 MG tablet Take 1 tablet (0.5 mg total) by mouth 2 (two) times daily. (Patient taking differently: Take 0.5 mg by mouth at bedtime. ) 60 tablet 0  . atorvastatin (LIPITOR) 20 MG tablet Take 20 mg by mouth daily.    Marland Kitchen azelastine (ASTELIN) 0.1 % nasal spray Place 1 spray into both nostrils 2 (two) times daily. Once daily  2  . budesonide-formoterol (SYMBICORT) 160-4.5 MCG/ACT inhaler INHALE 2 PUFFS INTO THE LUNGS TWICE A DAY 30.6 g 3  . ELIQUIS 5 MG TABS tablet Take 5 mg by mouth 2 (two) times daily.  0  . fish oil-omega-3 fatty acids 1000 MG capsule Take 1 capsule by mouth 3 (three) times daily.      . flecainide (TAMBOCOR) 100 MG tablet Take 100 mg by mouth 2 (two) times daily.  0  . furosemide (LASIX) 40 MG tablet Take 40 mg by mouth daily.     . Linaclotide (LINZESS) 145 MCG CAPS capsule Take 145 mcg by mouth daily.    . metoprolol succinate (TOPROL-XL) 50 MG 24  hr tablet Take 50 mg by mouth daily. Take with or immediately following a meal.    . metroNIDAZOLE (METROGEL) 0.75 % gel apply to affected area once daily  0  . Multiple Vitamin (MULITIVITAMIN WITH MINERALS) TABS Take 1 tablet by mouth daily.    . predniSONE (DELTASONE) 10 MG tablet Take 4 tabs x 2 days, 3 tabs x 2 days, 2 tabs x 2 days x 1 tab x 2 days, then STOP 20 tablet 0  . roflumilast (DALIRESP) 500 MCG TABS tablet Take 1 tablet (500 mcg total) by mouth daily. 90 tablet 3  . rOPINIRole (REQUIP) 0.5 MG tablet Take 1 tablet (0.5 mg total) by mouth daily. 30 tablet 0  . temazepam (RESTORIL) 15 MG capsule Take 15 mg by mouth at bedtime as needed for sleep.     Marland Kitchen tiotropium (SPIRIVA HANDIHALER) 18 MCG inhalation capsule INHALE THE CONTENTS OF 1 CAPSULE DAILY 90 capsule 3  . traMADol (ULTRAM) 50 MG tablet Take 50 mg by mouth 3 (three) times daily as needed for moderate pain.   0  . valsartan-hydrochlorothiazide (DIOVAN-HCT) 320-25 MG per  tablet Take 1 tablet by mouth daily.    Marland Kitchen amLODipine (NORVASC) 10 MG tablet Take 10 mg by mouth daily.     . potassium chloride (KLOR-CON) 20 MEQ packet Take 20 mEq by mouth daily. (Patient not taking: Reported on 08/13/2016) 3 tablet 0   No current facility-administered medications on file prior to visit.     Review of Systems Constitutional:   No  weight loss, night sweats,  Fevers, chills, fatigue, or  lassitude.  HEENT:   No headaches,  Difficulty swallowing,  Tooth/dental problems, or  Sore throat,                No sneezing, itching, ear ache, nasal congestion, post nasal drip,   CV:  No chest pain,  Orthopnea, PND, , anasarca, dizziness, palpitations, syncope.   GI  No heartburn, indigestion, abdominal pain, nausea, vomiting, diarrhea, change in bowel habits, loss of appetite, bloody stools.   Resp:   No chest wall deformity  Skin: no rash or lesions.  GU: no dysuria, change in color of urine, no urgency or frequency.  No flank pain, no hematuria   MS:  No joint pain or swelling.  No decreased range of motion.  No back pain.  Psych:  No change in mood or affect. No depression or anxiety.  No memory loss.         Objective:   Physical Exam Vitals:   08/13/16 1436  BP: 130/70  Pulse: 72  Temp: 98.4 F (36.9 C)  SpO2: 94%  Weight: 279 lb 9.6 oz (126.8 kg)  Height: 6\' 2"  (1.88 m)  Body mass index is 35.9 kg/m.   GEN: A/Ox3; pleasant , NAD, obese    HEENT:  [place]/AT,  EACs-clear, TMs-wnl, NOSE-clear, THROAT-clear, no lesions, no postnasal drip or exudate noted.   NECK:  Supple w/ fair ROM; no JVD; normal carotid impulses w/o bruits; no thyromegaly or nodules palpated; no lymphadenopathy.    RESP  Clear  P & A; w/o, wheezes/ rales/ or rhonchi. no accessory muscle use, no dullness to percussion  CARD:  RRR, no m/r/g  , tr  peripheral edema, pulses intact, no cyanosis or clubbing.  GI:   Soft & nt; nml bowel sounds; no organomegaly or masses detected.   Musco:  Warm bil, no deformities or joint swelling noted.   Neuro: alert, no focal deficits  noted.    Skin: Warm, no lesions or rashes  Jakera Beaupre NP-C  [facility]  08/13/2016        Assessment & Plan:

## 2016-08-13 NOTE — Assessment & Plan Note (Signed)
Cont on o2 .  

## 2016-08-13 NOTE — Assessment & Plan Note (Signed)
Recent slow to resolve with improvement after abx, steroids and gentle diuresis.  Senior dose flu shot today   Plan  Patient Instructions  Flu shot today .  Continue on current regimen  Follow up  Dr. Lake Bells next month as planned

## 2016-08-17 ENCOUNTER — Ambulatory Visit: Payer: BLUE CROSS/BLUE SHIELD | Admitting: Acute Care

## 2016-08-17 NOTE — Progress Notes (Signed)
Reviewed, agree with this plan of care 

## 2016-08-17 NOTE — Progress Notes (Signed)
Reviewed, agree with this plan 

## 2016-08-18 ENCOUNTER — Encounter (HOSPITAL_COMMUNITY): Payer: Self-pay

## 2016-08-20 ENCOUNTER — Encounter (HOSPITAL_COMMUNITY): Payer: Self-pay

## 2016-08-24 ENCOUNTER — Telehealth: Payer: Self-pay | Admitting: Pulmonary Disease

## 2016-08-24 MED ORDER — BUDESONIDE-FORMOTEROL FUMARATE 160-4.5 MCG/ACT IN AERO: INHALATION_SPRAY | RESPIRATORY_TRACT | 3 refills | Status: DC

## 2016-08-24 MED ORDER — ALBUTEROL SULFATE HFA 108 (90 BASE) MCG/ACT IN AERS: 2.0000 | INHALATION_SPRAY | RESPIRATORY_TRACT | 0 refills | Status: DC | PRN

## 2016-08-24 NOTE — Telephone Encounter (Signed)
I spoke with the pt to verify msg  He is requesting that we send refills on symbicort and proair to [facility] sent and pt states nothing further needed

## 2016-08-25 ENCOUNTER — Encounter (HOSPITAL_COMMUNITY)
Admission: RE | Admit: 2016-08-25 | Discharge: 2016-08-25 | Disposition: A | Discharge status: Home / Self Care | Payer: Self-pay | Source: Ambulatory Visit | Attending: Pulmonary Disease | Admitting: Pulmonary Disease

## 2016-08-27 ENCOUNTER — Encounter (HOSPITAL_COMMUNITY)
Admission: RE | Admit: 2016-08-27 | Discharge: 2016-08-27 | Disposition: A | Discharge status: Home / Self Care | Payer: Self-pay | Source: Ambulatory Visit | Attending: Pulmonary Disease | Admitting: Pulmonary Disease

## 2016-09-01 ENCOUNTER — Encounter (HOSPITAL_COMMUNITY)
Admission: RE | Admit: 2016-09-01 | Discharge: 2016-09-01 | Disposition: A | Discharge status: Home / Self Care | Payer: Self-pay | Source: Ambulatory Visit | Attending: Pulmonary Disease | Admitting: Pulmonary Disease

## 2016-09-03 ENCOUNTER — Encounter (HOSPITAL_COMMUNITY)
Admission: RE | Admit: 2016-09-03 | Discharge: 2016-09-03 | Disposition: A | Discharge status: Home / Self Care | Payer: Self-pay | Source: Ambulatory Visit | Attending: Pulmonary Disease | Admitting: Pulmonary Disease

## 2016-09-08 ENCOUNTER — Encounter (HOSPITAL_COMMUNITY)
Admission: RE | Admit: 2016-09-08 | Discharge: 2016-09-08 | Disposition: A | Discharge status: Home / Self Care | Payer: Self-pay | Source: Ambulatory Visit | Attending: Pulmonary Disease | Admitting: Pulmonary Disease

## 2016-09-10 ENCOUNTER — Encounter (HOSPITAL_COMMUNITY)
Admission: RE | Admit: 2016-09-10 | Discharge: 2016-09-10 | Disposition: A | Discharge status: Home / Self Care | Payer: Self-pay | Source: Ambulatory Visit | Attending: Internal Medicine | Admitting: Internal Medicine

## 2016-09-10 DIAGNOSIS — J9611 Chronic respiratory failure with hypoxia: Secondary | ICD-10-CM | POA: Insufficient documentation

## 2016-09-10 DIAGNOSIS — I10 Essential (primary) hypertension: Secondary | ICD-10-CM | POA: Insufficient documentation

## 2016-09-10 DIAGNOSIS — J439 Emphysema, unspecified: Secondary | ICD-10-CM | POA: Insufficient documentation

## 2016-09-15 ENCOUNTER — Encounter (HOSPITAL_COMMUNITY)
Admission: RE | Admit: 2016-09-15 | Discharge: 2016-09-15 | Disposition: A | Discharge status: Home / Self Care | Payer: Self-pay | Source: Ambulatory Visit | Attending: Pulmonary Disease | Admitting: Pulmonary Disease

## 2016-09-17 ENCOUNTER — Encounter (HOSPITAL_COMMUNITY)
Admission: RE | Admit: 2016-09-17 | Discharge: 2016-09-17 | Disposition: A | Discharge status: Home / Self Care | Payer: Self-pay | Source: Ambulatory Visit | Attending: Pulmonary Disease | Admitting: Pulmonary Disease

## 2016-09-22 ENCOUNTER — Encounter (HOSPITAL_COMMUNITY)
Admission: RE | Admit: 2016-09-22 | Discharge: 2016-09-22 | Disposition: A | Discharge status: Home / Self Care | Payer: Self-pay | Source: Ambulatory Visit | Attending: Pulmonary Disease | Admitting: Pulmonary Disease

## 2016-09-24 ENCOUNTER — Encounter (HOSPITAL_COMMUNITY)
Admission: RE | Admit: 2016-09-24 | Discharge: 2016-09-24 | Disposition: A | Discharge status: Home / Self Care | Payer: Self-pay | Source: Ambulatory Visit | Attending: Pulmonary Disease | Admitting: Pulmonary Disease

## 2016-09-29 ENCOUNTER — Encounter (HOSPITAL_COMMUNITY)
Admission: RE | Admit: 2016-09-29 | Discharge: 2016-09-29 | Disposition: A | Discharge status: Home / Self Care | Payer: Self-pay | Source: Ambulatory Visit | Attending: Pulmonary Disease | Admitting: Pulmonary Disease

## 2016-10-01 ENCOUNTER — Encounter (HOSPITAL_COMMUNITY): Payer: Self-pay

## 2016-10-06 ENCOUNTER — Encounter (HOSPITAL_COMMUNITY)
Admission: RE | Admit: 2016-10-06 | Discharge: 2016-10-06 | Disposition: A | Discharge status: Home / Self Care | Payer: Self-pay | Source: Ambulatory Visit | Attending: Pulmonary Disease | Admitting: Pulmonary Disease

## 2016-10-06 ENCOUNTER — Other Ambulatory Visit (INDEPENDENT_AMBULATORY_CARE_PROVIDER_SITE_OTHER): Payer: BLUE CROSS/BLUE SHIELD

## 2016-10-06 ENCOUNTER — Encounter: Payer: Self-pay | Admitting: Pulmonary Disease

## 2016-10-06 ENCOUNTER — Ambulatory Visit (INDEPENDENT_AMBULATORY_CARE_PROVIDER_SITE_OTHER): Payer: BLUE CROSS/BLUE SHIELD | Admitting: Pulmonary Disease

## 2016-10-06 VITALS — BP 130/62 | HR 60 | Ht 74.0 in | Wt 287.0 lb

## 2016-10-06 DIAGNOSIS — R0602 Shortness of breath: Secondary | ICD-10-CM

## 2016-10-06 DIAGNOSIS — J439 Emphysema, unspecified: Secondary | ICD-10-CM

## 2016-10-06 MED ORDER — ALPRAZOLAM 0.5 MG PO TABS: 1.0000 mg | ORAL_TABLET | Freq: Every evening | ORAL | 5 refills | Status: DC | PRN

## 2016-10-06 MED ORDER — TIOTROPIUM BROMIDE MONOHYDRATE 18 MCG IN CAPS: ORAL_CAPSULE | RESPIRATORY_TRACT | 3 refills | Status: DC

## 2016-10-06 MED ORDER — TIOTROPIUM BROMIDE MONOHYDRATE 18 MCG IN CAPS
ORAL_CAPSULE | RESPIRATORY_TRACT | 3 refills | Status: DC
Start: 2016-10-06 — End: 2016-10-26

## 2016-10-06 MED ORDER — ALPRAZOLAM 0.5 MG PO TABS: ORAL_TABLET | ORAL | 0 refills | Status: DC

## 2016-10-06 NOTE — Assessment & Plan Note (Signed)
This seems to have resolved with flecainide. He will continue follow-up with Dr. Einar Gip.

## 2016-10-06 NOTE — Assessment & Plan Note (Signed)
This problem continues to worsen this year for him. There are many potential causes including being overweight, his recent cardiac issues and his COPD. However, lung function testing this year did not show significant progression in his COPD compared to 10 years ago. Further, though he has had exacerbations of COPD this year he does not describe current symptoms of increasing chest congestion or suggestion that his airways disease is any worse.  I worry that his dyspnea may be due to interstitial lung disease as he has crackles in his right base again today. The differential diagnosis includes anemia and diastolic heart failure.  Plan: Check CT scan of the chest to evaluate for interstitial lung disease. Check CBC, proBNP If CT scan normal, consider right heart catheterization

## 2016-10-06 NOTE — Progress Notes (Signed)
Subjective:    Patient ID: Harold Hall, male    DOB: [birth year], 70 y.o.   MRN: [id number]  Synopsis: Former patient of Dr. Gwenette Greet with COPD. Dr. Gwenette Greet summarized his situation as follows: Spiro 2007:  FEV1 2.10 (46%), ratio 47 Stopped smoking 05/2011 Pulm rehab 2012, 2016 On exertional oxygen with portable concentrator. +response to daliresp.  He walks with 3L with exertion, 4-5 with heavy exercise Participating in pulmonary rehab in 2016  HPI Chief Complaint  Patient presents with  . Follow-up    pt c/o worsening SOB, unsure if this comes from copd or afib.     Asiyah has been struggling with dyspnea lately.   > somedays he has very low energy > definitely worse this year compared to normal > had a flare up over labor day and was treated with a steroid pack and an antibiotic > chest congestion waxes and wanes, not particularly worse this year > his dyspnea waxes and wanes as well, was actually able to use a leaf blower yesterday for 2 hours > he continues to participate in pulmonary rehab regulalry > not using albuterol  > he has orthopnea > has been this way for 5 years > he denies leg swelling right now > there have been no environment > no changes to his inhaled medicines  He was planned to have an ablation but was found to be back in sinus rhythm so this was aborted. > Ganji saw him last month   Past Medical History:  Diagnosis Date  . Allergic rhinitis   . Arthritis   . Cancer [facility])    left renal -solitary kidney  . COPD (chronic obstructive pulmonary disease) ([place])   . Erectile dysfunction   . Gout   . Hernia   . History of kidney cancer   . Hypercholesterolemia   . Hypertension   . RLS (restless legs syndrome)   . Sinus problem       Review of Systems  Constitutional: Negative for chills, fatigue and fever.  HENT: Negative for postnasal drip, rhinorrhea and sinus pressure.   Respiratory: Positive for shortness of breath. Negative for cough  and wheezing.   Cardiovascular: Positive for leg swelling. Negative for chest pain and palpitations.       Objective:   Physical Exam Vitals:   10/06/16 1148  BP: 130/62  Pulse: 60  SpO2: 94%  Weight: 287 lb (130.2 kg)  Height: 6\' 2"  (1.88 m)   RA  Gen: obsee, well appearing HENT: OP clear, TM's clear, neck supple PULM: Increased crackles R bases  Normal effort CV: Irreg irreg today, no mgr, significant edema GI: BS+, soft, nontender Derm: no cyanosis or rash Psyche: normal mood and affect  EKG performed today shows atrial fibrillation  07/2016 CXR personally reviewed> Nonspecific apical capping bilaterally, significant emphysema     Assessment & Plan:  Dyspnea This problem continues to worsen this year for him. There are many potential causes including being overweight, his recent cardiac issues and his COPD. However, lung function testing this year did not show significant progression in his COPD compared to 10 years ago. Further, though he has had exacerbations of COPD this year he does not describe current symptoms of increasing chest congestion or suggestion that his airways disease is any worse.  I worry that his dyspnea may be due to interstitial lung disease as he has crackles in his right base again today. The differential diagnosis includes anemia and diastolic heart failure.  Plan:  Check CT scan of the chest to evaluate for interstitial lung disease. Check CBC, proBNP If CT scan normal, consider right heart catheterization  COPD (chronic obstructive pulmonary disease) with emphysema ([place]) As noted above, his lung function testing this year did not show dramatic worsening in his lung function compared to 10 years ago. However, he has had exacerbations this year.  Plan: Continue Symbicort and Spiriva Flu shot up-to-date Given the exacerbations he has experienced lately I will check a serum IgE.    Chronic respiratory failure with hypoxia (HCC) Continue 3 L  of oxygen with exertion fortified with heavy exertion  Atrial fibrillation ([place]) This seems to have resolved with flecainide. He will continue follow-up with Dr. Einar Gip.    Current Outpatient Prescriptions:  .  albuterol (PROAIR HFA) 108 (90 Base) MCG/ACT inhaler, Inhale 2 puffs into the lungs every 4 (four) hours as needed for wheezing or shortness of breath., Disp: 3 Inhaler, Rfl: 0 .  allopurinol (ZYLOPRIM) 300 MG tablet, Take 300 mg by mouth daily., Disp: , Rfl:  .  ALPRAZolam (XANAX) 0.5 MG tablet, Take 1 tablet (0.5 mg total) by mouth 2 (two) times daily. (Patient taking differently: Take 0.5 mg by mouth at bedtime. ), Disp: 60 tablet, Rfl: 0 .  amLODipine (NORVASC) 10 MG tablet, Take 10 mg by mouth daily. , Disp: , Rfl:  .  atorvastatin (LIPITOR) 20 MG tablet, Take 20 mg by mouth daily., Disp: , Rfl:  .  azelastine (ASTELIN) 0.1 % nasal spray, Place 1 spray into both nostrils 2 (two) times daily. Once daily, Disp: , Rfl: 2 .  budesonide-formoterol (SYMBICORT) 160-4.5 MCG/ACT inhaler, INHALE 2 PUFFS INTO THE LUNGS TWICE A DAY, Disp: 30.6 g, Rfl: 3 .  ELIQUIS 5 MG TABS tablet, Take 5 mg by mouth 2 (two) times daily., Disp: , Rfl: 0 .  fish oil-omega-3 fatty acids 1000 MG capsule, Take 1 capsule by mouth 3 (three) times daily.  , Disp: , Rfl:  .  flecainide (TAMBOCOR) 100 MG tablet, Take 100 mg by mouth 2 (two) times daily., Disp: , Rfl: 0 .  furosemide (LASIX) 40 MG tablet, Take 40 mg by mouth daily. , Disp: , Rfl:  .  Linaclotide (LINZESS) 145 MCG CAPS capsule, Take 145 mcg by mouth daily., Disp: , Rfl:  .  metoprolol succinate (TOPROL-XL) 50 MG 24 hr tablet, Take 50 mg by mouth daily. Take with or immediately following a meal., Disp: , Rfl:  .  metroNIDAZOLE (METROGEL) 0.75 % gel, apply to affected area once daily, Disp: , Rfl: 0 .  Multiple Vitamin (MULITIVITAMIN WITH MINERALS) TABS, Take 1 tablet by mouth daily., Disp: , Rfl:  .  potassium chloride (KLOR-CON) 20 MEQ packet, Take 20 mEq  by mouth daily., Disp: 3 tablet, Rfl: 0 .  roflumilast (DALIRESP) 500 MCG TABS tablet, Take 1 tablet (500 mcg total) by mouth daily., Disp: 90 tablet, Rfl: 3 .  rOPINIRole (REQUIP) 0.5 MG tablet, Take 1 tablet (0.5 mg total) by mouth daily., Disp: 30 tablet, Rfl: 0 .  temazepam (RESTORIL) 15 MG capsule, Take 15 mg by mouth at bedtime as needed for sleep. , Disp: , Rfl:  .  tiotropium (SPIRIVA HANDIHALER) 18 MCG inhalation capsule, INHALE THE CONTENTS OF 1 CAPSULE DAILY, Disp: 90 capsule, Rfl: 3 .  traMADol (ULTRAM) 50 MG tablet, Take 50 mg by mouth 3 (three) times daily as needed for moderate pain. , Disp: , Rfl: 0 .  valsartan-hydrochlorothiazide (DIOVAN-HCT) 320-25 MG per tablet, Take 1  tablet by mouth daily., Disp: , Rfl:

## 2016-10-06 NOTE — Patient Instructions (Signed)
We will arrange for a CT scan of your chest Take the Xanax approximately 30 minutes prior to the CT scan We will call you with the results of today's blood work Keep taking her medicines as you're doing We will see you back in 6-8 weeks to go over these results

## 2016-10-06 NOTE — Assessment & Plan Note (Signed)
Continue 3 L of oxygen with exertion fortified with heavy exertion

## 2016-10-06 NOTE — Addendum Note (Signed)
Addended by: Collier Salina on: 10/06/2016 01:35 PM   Modules accepted: Orders

## 2016-10-06 NOTE — Assessment & Plan Note (Signed)
As noted above, his lung function testing this year did not show dramatic worsening in his lung function compared to 10 years ago. However, he has had exacerbations this year.  Plan: Continue Symbicort and Spiriva Flu shot up-to-date Given the exacerbations he has experienced lately I will check a serum IgE.

## 2016-10-07 LAB — CBC
HEMATOCRIT: 49.6 % (ref 39.0–52.0)
HEMOGLOBIN: 16.8 g/dL (ref 13.0–17.0)
MCHC: 33.8 g/dL (ref 30.0–36.0)
MCV: 86.3 fl (ref 78.0–100.0)
Platelets: 190 10*3/uL (ref 150.0–400.0)
RBC: 5.74 Mil/uL (ref 4.22–5.81)
RDW: 15.6 % — ABNORMAL HIGH (ref 11.5–15.5)
WBC: 8.4 10*3/uL (ref 4.0–10.5)

## 2016-10-07 LAB — IGE: IGE (IMMUNOGLOBULIN E), SERUM: 4 kU/L (ref <115)

## 2016-10-08 ENCOUNTER — Encounter (HOSPITAL_COMMUNITY)
Admission: RE | Admit: 2016-10-08 | Discharge: 2016-10-08 | Disposition: A | Discharge status: Home / Self Care | Payer: Self-pay | Source: Ambulatory Visit | Attending: Pulmonary Disease | Admitting: Pulmonary Disease

## 2016-10-09 ENCOUNTER — Ambulatory Visit (INDEPENDENT_AMBULATORY_CARE_PROVIDER_SITE_OTHER)
Admission: RE | Admit: 2016-10-09 | Discharge: 2016-10-09 | Disposition: A | Discharge status: Home / Self Care | Payer: BLUE CROSS/BLUE SHIELD | Source: Ambulatory Visit | Attending: Pulmonary Disease | Admitting: Pulmonary Disease

## 2016-10-09 DIAGNOSIS — R0602 Shortness of breath: Secondary | ICD-10-CM

## 2016-10-12 ENCOUNTER — Telehealth: Payer: Self-pay | Admitting: Pulmonary Disease

## 2016-10-12 NOTE — Telephone Encounter (Signed)
lmomtcb x1 

## 2016-10-12 NOTE — Telephone Encounter (Signed)
Pt called back and stated that he spoke with the mail order pharmacy and they are mailing him out a 90 day supply.  He stated that they will contact our office to further refills.  Pt did not want an rx sent in yet.  Nothing further is needed.

## 2016-10-13 ENCOUNTER — Encounter (HOSPITAL_COMMUNITY)
Admission: RE | Admit: 2016-10-13 | Discharge: 2016-10-13 | Disposition: A | Discharge status: Home / Self Care | Payer: Self-pay | Source: Ambulatory Visit | Attending: Internal Medicine | Admitting: Internal Medicine

## 2016-10-13 DIAGNOSIS — J439 Emphysema, unspecified: Secondary | ICD-10-CM | POA: Insufficient documentation

## 2016-10-13 DIAGNOSIS — J9611 Chronic respiratory failure with hypoxia: Secondary | ICD-10-CM | POA: Insufficient documentation

## 2016-10-13 DIAGNOSIS — I10 Essential (primary) hypertension: Secondary | ICD-10-CM | POA: Insufficient documentation

## 2016-10-13 NOTE — Progress Notes (Signed)
LMTCB

## 2016-10-13 NOTE — Progress Notes (Signed)
LMTCB on home and mobile numbers

## 2016-10-14 ENCOUNTER — Telehealth: Payer: Self-pay | Admitting: Pulmonary Disease

## 2016-10-14 NOTE — Telephone Encounter (Signed)
Called [facility]. There was a mix up with the pt's Spiriva refill. I have authorized for them to fill this for the pt. Nothing further was needed.

## 2016-10-14 NOTE — Telephone Encounter (Signed)
Ref # [id number].Harold Hall

## 2016-10-14 NOTE — Telephone Encounter (Signed)
LMTCB

## 2016-10-15 ENCOUNTER — Telehealth: Payer: Self-pay | Admitting: Pulmonary Disease

## 2016-10-15 ENCOUNTER — Encounter (HOSPITAL_COMMUNITY)
Admission: RE | Admit: 2016-10-15 | Discharge: 2016-10-15 | Disposition: A | Discharge status: Home / Self Care | Payer: Self-pay | Source: Ambulatory Visit | Attending: Pulmonary Disease | Admitting: Pulmonary Disease

## 2016-10-15 NOTE — Progress Notes (Signed)
Called and spoke to pt. Informed her of the results and recs per BQ. Pt verbalized understanding and denied any further questions or concerns at this time.   

## 2016-10-15 NOTE — Telephone Encounter (Signed)
Called and spoke to pt. Informed her of the results and recs per BQ. Pt verbalized understanding and denied any further questions or concerns at this time.    Notes Recorded by Juanito Doom, MD on 10/12/2016 at 1:48 AM EST A, Please let him know that his CT scan did not show interstitial lung disease but he has a lot of emphysema and airway thickening that explain his dyspnea. Also there were no worrisome findings to suggest cancer. Thanks B  Notes Recorded by Juanito Doom, MD on 10/08/2016 at 10:13 AM EST A, Please let the patient know this was OK Thanks, B

## 2016-10-19 ENCOUNTER — Telehealth: Payer: Self-pay | Admitting: Pulmonary Disease

## 2016-10-19 MED ORDER — ROFLUMILAST 500 MCG PO TABS: 500.0000 ug | ORAL_TABLET | Freq: Every day | ORAL | 3 refills | Status: DC

## 2016-10-19 NOTE — Telephone Encounter (Signed)
Pt requesting 90 day refill on daliresp to [facility] order pharmacy.  This has been sent in.  Nothing further needed.

## 2016-10-20 ENCOUNTER — Encounter (HOSPITAL_COMMUNITY)
Admission: RE | Admit: 2016-10-20 | Discharge: 2016-10-20 | Disposition: A | Discharge status: Home / Self Care | Payer: Self-pay | Source: Ambulatory Visit | Attending: Pulmonary Disease | Admitting: Pulmonary Disease

## 2016-10-22 ENCOUNTER — Encounter (HOSPITAL_COMMUNITY)
Admission: RE | Admit: 2016-10-22 | Discharge: 2016-10-22 | Disposition: A | Discharge status: Home / Self Care | Payer: Self-pay | Source: Ambulatory Visit | Attending: Pulmonary Disease | Admitting: Pulmonary Disease

## 2016-10-26 ENCOUNTER — Other Ambulatory Visit: Payer: Self-pay

## 2016-10-26 ENCOUNTER — Telehealth: Payer: Self-pay | Admitting: Pulmonary Disease

## 2016-10-26 MED ORDER — TIOTROPIUM BROMIDE MONOHYDRATE 18 MCG IN CAPS: ORAL_CAPSULE | RESPIRATORY_TRACT | 3 refills | Status: DC

## 2016-10-26 NOTE — Telephone Encounter (Signed)
Spoke with patient. Informed him that the RX has been sent in for him. Patient verbalized understanding. No further questions.

## 2016-10-27 ENCOUNTER — Encounter (HOSPITAL_COMMUNITY)
Admission: RE | Admit: 2016-10-27 | Discharge: 2016-10-27 | Disposition: A | Discharge status: Home / Self Care | Payer: Self-pay | Source: Ambulatory Visit | Attending: Pulmonary Disease | Admitting: Pulmonary Disease

## 2016-10-29 ENCOUNTER — Encounter (HOSPITAL_COMMUNITY): Payer: Self-pay

## 2016-11-03 ENCOUNTER — Encounter (HOSPITAL_COMMUNITY): Payer: Self-pay

## 2016-11-05 ENCOUNTER — Encounter (HOSPITAL_COMMUNITY): Payer: Self-pay

## 2016-11-17 ENCOUNTER — Encounter (HOSPITAL_COMMUNITY)
Admission: RE | Admit: 2016-11-17 | Discharge: 2016-11-17 | Disposition: A | Discharge status: Home / Self Care | Payer: Self-pay | Source: Ambulatory Visit | Attending: Internal Medicine | Admitting: Internal Medicine

## 2016-11-17 DIAGNOSIS — J9611 Chronic respiratory failure with hypoxia: Secondary | ICD-10-CM | POA: Insufficient documentation

## 2016-11-17 DIAGNOSIS — J439 Emphysema, unspecified: Secondary | ICD-10-CM | POA: Insufficient documentation

## 2016-11-17 DIAGNOSIS — I10 Essential (primary) hypertension: Secondary | ICD-10-CM | POA: Insufficient documentation

## 2016-11-19 ENCOUNTER — Encounter (HOSPITAL_COMMUNITY)
Admission: RE | Admit: 2016-11-19 | Discharge: 2016-11-19 | Disposition: A | Discharge status: Home / Self Care | Payer: Self-pay | Source: Ambulatory Visit | Attending: Internal Medicine | Admitting: Internal Medicine

## 2016-11-24 ENCOUNTER — Encounter: Payer: Self-pay | Admitting: Pulmonary Disease

## 2016-11-24 ENCOUNTER — Ambulatory Visit (INDEPENDENT_AMBULATORY_CARE_PROVIDER_SITE_OTHER): Payer: BLUE CROSS/BLUE SHIELD | Admitting: Pulmonary Disease

## 2016-11-24 DIAGNOSIS — J9611 Chronic respiratory failure with hypoxia: Secondary | ICD-10-CM

## 2016-11-24 DIAGNOSIS — I4891 Unspecified atrial fibrillation: Secondary | ICD-10-CM

## 2016-11-24 DIAGNOSIS — J432 Centrilobular emphysema: Secondary | ICD-10-CM | POA: Diagnosis not present

## 2016-11-24 NOTE — Progress Notes (Signed)
Subjective:    Patient ID: Harold Hall, male    DOB: [birth year], 71 y.o.   MRN: [id number]  Synopsis: Former patient of Dr. Gwenette Greet with COPD. Dr. Gwenette Greet summarized his situation as follows: Spiro 2007:  FEV1 2.10 (46%), ratio 47 Stopped smoking 05/2011 Pulm rehab 2012, 2016 On exertional oxygen with portable concentrator. +response to daliresp.  He walks with 3L with exertion, 4-5 with heavy exercise Participating in pulmonary rehab in 2016  HPI Chief Complaint  Patient presents with  . Follow-up    Pt stated his SOB has remained unchanged and is here for CT results    He says he still has dyspnea since the last visit. He has not been in pulmonary rehab due to some skin surgery. He notes walking uphill causes significant dyspnea.  He is still taking his medications (inhalers). He is bothered somewhat by the prospect of dying from COPD and the uncertainty of the progression of his disease.   He is still using and benefiting from his oxygen use.     In regards to his Atrial Fibrillation he is continuing follow up with Dr. Everitt Amber, and is now back in sinus rhythm.  He is taking Eliquis and metoprolol regularly.   Past Medical History:  Diagnosis Date  . Allergic rhinitis   . Arthritis   . Cancer [facility])    left renal -solitary kidney  . COPD (chronic obstructive pulmonary disease) ([place])   . Erectile dysfunction   . Gout   . Hernia   . History of kidney cancer   . Hypercholesterolemia   . Hypertension   . RLS (restless legs syndrome)   . Sinus problem       Review of Systems  Constitutional: Negative for chills, fatigue and fever.  HENT: Negative for postnasal drip, rhinorrhea and sinus pressure.   Respiratory: Positive for shortness of breath. Negative for cough and wheezing.   Cardiovascular: Positive for leg swelling. Negative for chest pain and palpitations.       Objective:   Physical Exam Vitals:   11/24/16 0955  BP: 118/68  Pulse: 63  SpO2: 94%    Weight: 294 lb (133.4 kg)  Height: 6\' 2"  (1.88 m)   RA  Gen: obese, chornically ill appearing, appearing HENT: OP clear, TM's clear, neck supple PULM: Crackles bases, normal percussion CV: RRR, no mgr, trace edema GI: BS+, soft, nontender Derm: no cyanosis or rash Psyche: normal mood and affect    Imaging:  December 2017 chest CT images personal reviewed showing significant emphysema with an upper lobe predominance, no clear interstitial lung disease.  Blood: 09/2016 IgE 4  CBC    Component Value Date/Time   WBC 8.4 10/06/2016 1306   RBC 5.74 10/06/2016 1306   HGB 16.8 10/06/2016 1306   HCT 49.6 10/06/2016 1306   PLT 190.0 10/06/2016 1306   MCV 86.3 10/06/2016 1306   MCH 28.8 06/10/2011 0904   MCHC 33.8 10/06/2016 1306   RDW 15.6 (H) 10/06/2016 1306   LYMPHSABS 1.1 06/10/2011 0904   MONOABS 1.0 06/10/2011 0904   EOSABS 0.3 06/10/2011 0904   BASOSABS 0.0 06/10/2011 0904        Assessment & Plan:  COPD (chronic obstructive pulmonary disease) with emphysema ([place]) Skeeter hasn't had worsening symptoms necessarily since the last visit nor has he had an exacerbation but overall his symptoms have been progressing over the last 12 months. He is not anemic, his CT scan does not show evidence of a concomitant lung  disease other than severe emphysema and COPD.  Today we talked about the fact that COPD is a progressive condition and when in combination with obesity and cardiac conditions it's very likely to be at least contributing to his dyspnea.  We spent an extended amount of time talking about depression and anxiety which can be associated with COPD. Had a low below this this year but he says it's not severe. I offered referral to counseling or psychiatry and he deferred.  Plan: Continue Symbicort Continue Spiriva Continue using oxygen as prescribed  Greater than 50% of this 27 minute visit spent face-to-face  Follow-up 3 months  Chronic respiratory failure with  hypoxia (HCC) As above  Atrial fibrillation ([place]) Continue f/u with Dr. Tamsen Snider    Current Outpatient Prescriptions:  .  albuterol (PROAIR HFA) 108 (90 Base) MCG/ACT inhaler, Inhale 2 puffs into the lungs every 4 (four) hours as needed for wheezing or shortness of breath., Disp: 3 Inhaler, Rfl: 0 .  allopurinol (ZYLOPRIM) 300 MG tablet, Take 300 mg by mouth daily., Disp: , Rfl:  .  ALPRAZolam (XANAX) 0.5 MG tablet, Take 1 tablet (0.5 mg total) by mouth 2 (two) times daily. (Patient taking differently: Take 0.5 mg by mouth at bedtime. ), Disp: 60 tablet, Rfl: 0 .  amLODipine (NORVASC) 10 MG tablet, Take 10 mg by mouth daily. , Disp: , Rfl:  .  atorvastatin (LIPITOR) 20 MG tablet, Take 20 mg by mouth daily., Disp: , Rfl:  .  azelastine (ASTELIN) 0.1 % nasal spray, Place 1 spray into both nostrils 2 (two) times daily. Once daily, Disp: , Rfl: 2 .  budesonide-formoterol (SYMBICORT) 160-4.5 MCG/ACT inhaler, INHALE 2 PUFFS INTO THE LUNGS TWICE A DAY, Disp: 30.6 g, Rfl: 3 .  ELIQUIS 5 MG TABS tablet, Take 5 mg by mouth 2 (two) times daily., Disp: , Rfl: 0 .  fish oil-omega-3 fatty acids 1000 MG capsule, Take 1 capsule by mouth 3 (three) times daily.  , Disp: , Rfl:  .  flecainide (TAMBOCOR) 100 MG tablet, Take 100 mg by mouth 2 (two) times daily., Disp: , Rfl: 0 .  furosemide (LASIX) 40 MG tablet, Take 40 mg by mouth daily. , Disp: , Rfl:  .  Linaclotide (LINZESS) 145 MCG CAPS capsule, Take 145 mcg by mouth daily., Disp: , Rfl:  .  metoprolol succinate (TOPROL-XL) 50 MG 24 hr tablet, Take 50 mg by mouth daily. Take with or immediately following a meal., Disp: , Rfl:  .  metroNIDAZOLE (METROGEL) 0.75 % gel, apply to affected area once daily, Disp: , Rfl: 0 .  Multiple Vitamin (MULITIVITAMIN WITH MINERALS) TABS, Take 1 tablet by mouth daily., Disp: , Rfl:  .  potassium chloride (KLOR-CON) 20 MEQ packet, Take 20 mEq by mouth daily., Disp: 3 tablet, Rfl: 0 .  roflumilast (DALIRESP) 500 MCG TABS tablet,  Take 1 tablet (500 mcg total) by mouth daily., Disp: 90 tablet, Rfl: 3 .  rOPINIRole (REQUIP) 0.5 MG tablet, Take 1 tablet (0.5 mg total) by mouth daily., Disp: 30 tablet, Rfl: 0 .  temazepam (RESTORIL) 15 MG capsule, Take 15 mg by mouth at bedtime as needed for sleep. , Disp: , Rfl:  .  tiotropium (SPIRIVA HANDIHALER) 18 MCG inhalation capsule, INHALE THE CONTENTS OF 1 CAPSULE DAILY, Disp: 90 capsule, Rfl: 3 .  traMADol (ULTRAM) 50 MG tablet, Take 50 mg by mouth 3 (three) times daily as needed for moderate pain. , Disp: , Rfl: 0 .  valsartan-hydrochlorothiazide (DIOVAN-HCT) 320-25 MG  per tablet, Take 1 tablet by mouth daily., Disp: , Rfl:  .  ALPRAZolam (XANAX) 0.5 MG tablet, Take 1 tablet 30 min prior to CT scan. (Patient not taking: Reported on 11/24/2016), Disp: 1 tablet, Rfl: 0

## 2016-11-24 NOTE — Assessment & Plan Note (Signed)
As above.

## 2016-11-24 NOTE — Assessment & Plan Note (Signed)
Continue f/u with Dr. Tamsen Snider

## 2016-11-24 NOTE — Assessment & Plan Note (Signed)
Harold Hall hasn't had worsening symptoms necessarily since the last visit nor has he had an exacerbation but overall his symptoms have been progressing over the last 12 months. He is not anemic, his CT scan does not show evidence of a concomitant lung disease other than severe emphysema and COPD.  Today we talked about the fact that COPD is a progressive condition and when in combination with obesity and cardiac conditions it's very likely to be at least contributing to his dyspnea.  We spent an extended amount of time talking about depression and anxiety which can be associated with COPD. Had a low below this this year but he says it's not severe. I offered referral to counseling or psychiatry and he deferred.  Plan: Continue Symbicort Continue Spiriva Continue using oxygen as prescribed  Greater than 50% of this 27 minute visit spent face-to-face  Follow-up 3 months

## 2016-11-24 NOTE — Patient Instructions (Signed)
Continue Symbicort Continue Spiriva Continue using oxygen as prescribed  F/u 3 months

## 2016-11-26 ENCOUNTER — Encounter (HOSPITAL_COMMUNITY): Payer: Self-pay

## 2016-12-01 ENCOUNTER — Encounter (HOSPITAL_COMMUNITY)
Admission: RE | Admit: 2016-12-01 | Discharge: 2016-12-01 | Disposition: A | Discharge status: Home / Self Care | Payer: Self-pay | Source: Ambulatory Visit | Attending: Internal Medicine | Admitting: Internal Medicine

## 2016-12-03 ENCOUNTER — Encounter (HOSPITAL_COMMUNITY)
Admission: RE | Admit: 2016-12-03 | Discharge: 2016-12-03 | Disposition: A | Discharge status: Home / Self Care | Payer: Self-pay | Source: Ambulatory Visit | Attending: Internal Medicine | Admitting: Internal Medicine

## 2016-12-08 ENCOUNTER — Encounter (HOSPITAL_COMMUNITY)
Admission: RE | Admit: 2016-12-08 | Discharge: 2016-12-08 | Disposition: A | Discharge status: Home / Self Care | Payer: Self-pay | Source: Ambulatory Visit | Attending: Internal Medicine | Admitting: Internal Medicine

## 2016-12-10 ENCOUNTER — Encounter (HOSPITAL_COMMUNITY)
Admission: RE | Admit: 2016-12-10 | Discharge: 2016-12-10 | Disposition: A | Discharge status: Home / Self Care | Payer: BLUE CROSS/BLUE SHIELD | Source: Ambulatory Visit | Attending: Internal Medicine | Admitting: Internal Medicine

## 2016-12-10 DIAGNOSIS — J9611 Chronic respiratory failure with hypoxia: Secondary | ICD-10-CM | POA: Insufficient documentation

## 2016-12-10 DIAGNOSIS — I10 Essential (primary) hypertension: Secondary | ICD-10-CM | POA: Diagnosis not present

## 2016-12-10 DIAGNOSIS — J439 Emphysema, unspecified: Secondary | ICD-10-CM | POA: Diagnosis not present

## 2016-12-15 ENCOUNTER — Encounter (HOSPITAL_COMMUNITY)
Admission: RE | Admit: 2016-12-15 | Discharge: 2016-12-15 | Disposition: A | Discharge status: Home / Self Care | Payer: BLUE CROSS/BLUE SHIELD | Source: Ambulatory Visit | Attending: Internal Medicine | Admitting: Internal Medicine

## 2016-12-17 ENCOUNTER — Encounter (HOSPITAL_COMMUNITY)
Admission: RE | Admit: 2016-12-17 | Discharge: 2016-12-17 | Disposition: A | Discharge status: Home / Self Care | Payer: BLUE CROSS/BLUE SHIELD | Source: Ambulatory Visit | Attending: Internal Medicine | Admitting: Internal Medicine

## 2016-12-22 ENCOUNTER — Encounter (HOSPITAL_COMMUNITY)
Admission: RE | Admit: 2016-12-22 | Discharge: 2016-12-22 | Disposition: A | Discharge status: Home / Self Care | Payer: BLUE CROSS/BLUE SHIELD | Source: Ambulatory Visit | Attending: Internal Medicine | Admitting: Internal Medicine

## 2016-12-24 ENCOUNTER — Encounter (HOSPITAL_COMMUNITY)
Admission: RE | Admit: 2016-12-24 | Discharge: 2016-12-24 | Disposition: A | Discharge status: Home / Self Care | Payer: BLUE CROSS/BLUE SHIELD | Source: Ambulatory Visit | Attending: Internal Medicine | Admitting: Internal Medicine

## 2016-12-29 ENCOUNTER — Encounter (HOSPITAL_COMMUNITY)
Admission: RE | Admit: 2016-12-29 | Discharge: 2016-12-29 | Disposition: A | Discharge status: Home / Self Care | Payer: BLUE CROSS/BLUE SHIELD | Source: Ambulatory Visit | Attending: Internal Medicine | Admitting: Internal Medicine

## 2016-12-31 ENCOUNTER — Encounter (HOSPITAL_COMMUNITY)
Admission: RE | Admit: 2016-12-31 | Discharge: 2016-12-31 | Disposition: A | Discharge status: Home / Self Care | Payer: BLUE CROSS/BLUE SHIELD | Source: Ambulatory Visit | Attending: Internal Medicine | Admitting: Internal Medicine

## 2017-01-05 ENCOUNTER — Encounter (HOSPITAL_COMMUNITY)
Admission: RE | Admit: 2017-01-05 | Discharge: 2017-01-05 | Disposition: A | Discharge status: Home / Self Care | Payer: BLUE CROSS/BLUE SHIELD | Source: Ambulatory Visit | Attending: Internal Medicine | Admitting: Internal Medicine

## 2017-01-07 ENCOUNTER — Encounter (HOSPITAL_COMMUNITY): Payer: Self-pay

## 2017-01-07 DIAGNOSIS — I10 Essential (primary) hypertension: Secondary | ICD-10-CM | POA: Insufficient documentation

## 2017-01-07 DIAGNOSIS — J9611 Chronic respiratory failure with hypoxia: Secondary | ICD-10-CM | POA: Insufficient documentation

## 2017-01-07 DIAGNOSIS — J439 Emphysema, unspecified: Secondary | ICD-10-CM | POA: Insufficient documentation

## 2017-01-12 ENCOUNTER — Encounter (HOSPITAL_COMMUNITY)
Admission: RE | Admit: 2017-01-12 | Discharge: 2017-01-12 | Disposition: A | Discharge status: Home / Self Care | Payer: Self-pay | Source: Ambulatory Visit | Attending: Internal Medicine | Admitting: Internal Medicine

## 2017-01-14 ENCOUNTER — Encounter (HOSPITAL_COMMUNITY)
Admission: RE | Admit: 2017-01-14 | Discharge: 2017-01-14 | Disposition: A | Discharge status: Home / Self Care | Payer: Self-pay | Source: Ambulatory Visit | Attending: Internal Medicine | Admitting: Internal Medicine

## 2017-01-19 ENCOUNTER — Encounter (HOSPITAL_COMMUNITY): Payer: Self-pay

## 2017-01-21 ENCOUNTER — Encounter (HOSPITAL_COMMUNITY)
Admission: RE | Admit: 2017-01-21 | Discharge: 2017-01-21 | Disposition: A | Discharge status: Home / Self Care | Payer: Self-pay | Source: Ambulatory Visit | Attending: Internal Medicine | Admitting: Internal Medicine

## 2017-01-26 ENCOUNTER — Encounter (HOSPITAL_COMMUNITY)
Admission: RE | Admit: 2017-01-26 | Discharge: 2017-01-26 | Disposition: A | Discharge status: Home / Self Care | Payer: Self-pay | Source: Ambulatory Visit | Attending: Internal Medicine | Admitting: Internal Medicine

## 2017-01-28 ENCOUNTER — Encounter (HOSPITAL_COMMUNITY)
Admission: RE | Admit: 2017-01-28 | Discharge: 2017-01-28 | Disposition: A | Discharge status: Home / Self Care | Payer: Self-pay | Source: Ambulatory Visit | Attending: Internal Medicine | Admitting: Internal Medicine

## 2017-02-02 ENCOUNTER — Encounter (HOSPITAL_COMMUNITY)
Admission: RE | Admit: 2017-02-02 | Discharge: 2017-02-02 | Disposition: A | Discharge status: Home / Self Care | Payer: Self-pay | Source: Ambulatory Visit | Attending: Internal Medicine | Admitting: Internal Medicine

## 2017-02-04 ENCOUNTER — Encounter (HOSPITAL_COMMUNITY)
Admission: RE | Admit: 2017-02-04 | Discharge: 2017-02-04 | Disposition: A | Discharge status: Home / Self Care | Payer: Self-pay | Source: Ambulatory Visit | Attending: Internal Medicine | Admitting: Internal Medicine

## 2017-02-09 ENCOUNTER — Encounter (HOSPITAL_COMMUNITY)
Admission: RE | Admit: 2017-02-09 | Discharge: 2017-02-09 | Disposition: A | Discharge status: Home / Self Care | Payer: BLUE CROSS/BLUE SHIELD | Source: Ambulatory Visit | Attending: Internal Medicine | Admitting: Internal Medicine

## 2017-02-09 DIAGNOSIS — J9611 Chronic respiratory failure with hypoxia: Secondary | ICD-10-CM | POA: Insufficient documentation

## 2017-02-09 DIAGNOSIS — J439 Emphysema, unspecified: Secondary | ICD-10-CM | POA: Insufficient documentation

## 2017-02-09 DIAGNOSIS — I10 Essential (primary) hypertension: Secondary | ICD-10-CM | POA: Insufficient documentation

## 2017-02-11 ENCOUNTER — Encounter (HOSPITAL_COMMUNITY): Payer: Self-pay

## 2017-02-16 ENCOUNTER — Encounter (HOSPITAL_COMMUNITY): Payer: Self-pay

## 2017-02-18 ENCOUNTER — Encounter (HOSPITAL_COMMUNITY)
Admission: RE | Admit: 2017-02-18 | Discharge: 2017-02-18 | Disposition: A | Discharge status: Home / Self Care | Payer: Self-pay | Source: Ambulatory Visit | Attending: Internal Medicine | Admitting: Internal Medicine

## 2017-02-22 ENCOUNTER — Ambulatory Visit: Payer: BLUE CROSS/BLUE SHIELD | Admitting: Pulmonary Disease

## 2017-02-23 ENCOUNTER — Encounter (HOSPITAL_COMMUNITY): Payer: Self-pay

## 2017-02-25 ENCOUNTER — Encounter (HOSPITAL_COMMUNITY)
Admission: RE | Admit: 2017-02-25 | Discharge: 2017-02-25 | Disposition: A | Discharge status: Home / Self Care | Payer: Self-pay | Source: Ambulatory Visit | Attending: Internal Medicine | Admitting: Internal Medicine

## 2017-02-25 ENCOUNTER — Ambulatory Visit: Payer: BLUE CROSS/BLUE SHIELD | Admitting: Pulmonary Disease

## 2017-03-02 ENCOUNTER — Encounter (HOSPITAL_COMMUNITY): Payer: Self-pay

## 2017-03-04 ENCOUNTER — Encounter (HOSPITAL_COMMUNITY)
Admission: RE | Admit: 2017-03-04 | Discharge: 2017-03-04 | Disposition: A | Discharge status: Home / Self Care | Payer: Self-pay | Source: Ambulatory Visit | Attending: Internal Medicine | Admitting: Internal Medicine

## 2017-03-09 ENCOUNTER — Encounter (HOSPITAL_COMMUNITY)
Admission: RE | Admit: 2017-03-09 | Discharge: 2017-03-09 | Disposition: A | Discharge status: Home / Self Care | Payer: Self-pay | Source: Ambulatory Visit | Attending: Internal Medicine | Admitting: Internal Medicine

## 2017-03-09 DIAGNOSIS — J439 Emphysema, unspecified: Secondary | ICD-10-CM | POA: Insufficient documentation

## 2017-03-09 DIAGNOSIS — I10 Essential (primary) hypertension: Secondary | ICD-10-CM | POA: Insufficient documentation

## 2017-03-09 DIAGNOSIS — J9611 Chronic respiratory failure with hypoxia: Secondary | ICD-10-CM | POA: Insufficient documentation

## 2017-03-11 ENCOUNTER — Encounter (HOSPITAL_COMMUNITY)
Admission: RE | Admit: 2017-03-11 | Discharge: 2017-03-11 | Disposition: A | Discharge status: Home / Self Care | Payer: Self-pay | Source: Ambulatory Visit | Attending: Internal Medicine | Admitting: Internal Medicine

## 2017-03-16 ENCOUNTER — Encounter (HOSPITAL_COMMUNITY): Payer: Self-pay

## 2017-03-18 ENCOUNTER — Encounter (HOSPITAL_COMMUNITY): Payer: Self-pay

## 2017-03-23 ENCOUNTER — Encounter (HOSPITAL_COMMUNITY)
Admission: RE | Admit: 2017-03-23 | Discharge: 2017-03-23 | Disposition: A | Discharge status: Home / Self Care | Payer: Self-pay | Source: Ambulatory Visit | Attending: Internal Medicine | Admitting: Internal Medicine

## 2017-03-25 ENCOUNTER — Encounter (HOSPITAL_COMMUNITY)
Admission: RE | Admit: 2017-03-25 | Discharge: 2017-03-25 | Disposition: A | Discharge status: Home / Self Care | Payer: Self-pay | Source: Ambulatory Visit | Attending: Internal Medicine | Admitting: Internal Medicine

## 2017-03-30 ENCOUNTER — Encounter: Payer: Self-pay | Admitting: Pulmonary Disease

## 2017-03-30 ENCOUNTER — Ambulatory Visit (INDEPENDENT_AMBULATORY_CARE_PROVIDER_SITE_OTHER): Payer: BLUE CROSS/BLUE SHIELD | Admitting: Pulmonary Disease

## 2017-03-30 ENCOUNTER — Other Ambulatory Visit (INDEPENDENT_AMBULATORY_CARE_PROVIDER_SITE_OTHER): Payer: BLUE CROSS/BLUE SHIELD

## 2017-03-30 ENCOUNTER — Encounter (HOSPITAL_COMMUNITY)
Admission: RE | Admit: 2017-03-30 | Discharge: 2017-03-30 | Disposition: A | Discharge status: Home / Self Care | Payer: Self-pay | Source: Ambulatory Visit | Attending: Internal Medicine | Admitting: Internal Medicine

## 2017-03-30 VITALS — BP 130/60 | HR 65 | Ht 74.0 in | Wt 292.6 lb

## 2017-03-30 DIAGNOSIS — R0609 Other forms of dyspnea: Secondary | ICD-10-CM

## 2017-03-30 DIAGNOSIS — J432 Centrilobular emphysema: Secondary | ICD-10-CM

## 2017-03-30 DIAGNOSIS — I4891 Unspecified atrial fibrillation: Secondary | ICD-10-CM | POA: Diagnosis not present

## 2017-03-30 DIAGNOSIS — J9611 Chronic respiratory failure with hypoxia: Secondary | ICD-10-CM

## 2017-03-30 DIAGNOSIS — R5383 Other fatigue: Secondary | ICD-10-CM

## 2017-03-30 LAB — TSH: TSH: 2.52 u[IU]/mL (ref 0.35–4.50)

## 2017-03-30 MED ORDER — PREDNISONE 10 MG PO TABS: ORAL_TABLET | ORAL | 0 refills | Status: DC

## 2017-03-30 NOTE — Progress Notes (Signed)
Subjective:    Patient ID: Harold Hall, male    DOB: May 20, 1946, 71 y.o.   MRN: [id number]  Synopsis: Former patient of Dr. Gwenette Hall with COPD. Dr. Gwenette Hall summarized his situation as follows: [facility] 2007:  FEV1 2.10 (46%), ratio 47 Stopped smoking 05/2011 Pulm rehab 2012, 2016 On exertional oxygen with portable concentrator. +response to daliresp.  He walks with 3L with exertion, 4-5 with heavy exercise Participating in pulmonary rehab in 2016  HPI Chief Complaint  Patient presents with  . Follow-up    3 month emphysema, feels weak, SOB, went to 5L from 4L with exertion   [name] continues to work out with the pulmonary rehab folks regularly. He says that he has been weak lately and restless at night.  He says that he is not falling asleep well.  He has increased his oxygen to 5Lpm while exerting himself with walking. He had been using 4L with walking up until this point. He hsa not noticed an increase in cough, or wheezing. However he has noticed an increase in phlegm which is typically clear.  It will rarely be a little yellow. He has noticed that his nose is bleeding some as well.  This started when he was on blood thinners. He has never had a sleep study. He says that he doesn't snore.  He feels fatigued and washed out during the day, worse in the mornings.  He doesn't nap and doesn't feel too sleepy.   He has been feeling a burning sensation in his right neck from time to time> this is under his jaw and initially felt like a bug bite.  This has been going on for 3 months.  The area of swelling and pain has increased in size and the pain occurs more frequently.  He has noticed that certain movements (moving his neck) will make it worse.  Past Medical History:  Diagnosis Date  . Allergic rhinitis   . Arthritis   . Cancer [facility])    left renal -solitary kidney  . COPD (chronic obstructive pulmonary disease) ([place])   . Erectile dysfunction   . Gout   . Hernia   . History  of kidney cancer   . Hypercholesterolemia   . Hypertension   . RLS (restless legs syndrome)   . Sinus problem       Review of Systems  Constitutional: Negative for chills, fatigue and fever.  HENT: Negative for postnasal drip, rhinorrhea and sinus pressure.   Respiratory: Positive for shortness of breath. Negative for cough and wheezing.   Cardiovascular: Positive for leg swelling. Negative for chest pain and palpitations.       Objective:   Physical Exam Vitals:   03/30/17 1114 03/30/17 1117  BP: 130/60   Pulse: 65   SpO2: (!) 88% 91%  Weight: 292 lb 9.6 oz (132.7 kg)   Height: 6\' 2"  (1.88 m)    RA  Gen: chronically ill appearing HENT: OP clear, TM's clear, neck supple PULM: Poor air movement B, normal percussion CV: Irreg Irreg, no mgr, trace edema GI: BS+, soft, nontender Derm: no cyanosis or rash Psyche: normal mood and affect   Imaging:  December 2017 chest CT images personal reviewed showing significant emphysema with an upper lobe predominance, no clear interstitial lung disease.  Blood: 09/2016 IgE 4  CBC    Component Value Date/Time   WBC 8.4 10/06/2016 1306   RBC 5.74 10/06/2016 1306   HGB 16.8 10/06/2016 1306   HCT 49.6 10/06/2016 1306  PLT 190.0 10/06/2016 1306   MCV 86.3 10/06/2016 1306   MCH 28.8 06/10/2011 0904   MCHC 33.8 10/06/2016 1306   RDW 15.6 (H) 10/06/2016 1306   LYMPHSABS 1.1 06/10/2011 0904   MONOABS 1.0 06/10/2011 0904   EOSABS 0.3 06/10/2011 0904   BASOSABS 0.0 06/10/2011 0904        Assessment & Plan:  Impression: Chronic respiratory failure with hypoxemia Dyspnea Fatigue Obesity A. fib COPD  Discussion: Harold Hall continues to experience worsening dyspnea. He has been using a bit more oxygen lately and he's had a slight increase in mucus production. I worry that this may be related to changes in the weather with pollen recently leading to increasing chronic bronchitis symptoms which can be associated with worsening  oxygenation.  That said, prior to the onset of the chest congestion he had been experiencing more fatigue and dyspnea. He is at high risk for sleep apnea considering his obesity, COPD, fatigue during the day, and recent diagnosis of A. fib. The differential diagnosis of his fatigue includes thyroid disease. We have assessed him recently for anemia and he did not have that.  He has severe COPD overall.  For the fatigue you have been experiencing: We will check a home sleep study We will check a blood thyroid test  Four-year chronic respiratory failure with hypoxemia Keep using 3 L of oxygen at rest Continues to use 5 L with exertion We will check her oxygen level while walking on 5 L today  For your severe COPD: Continue taking Spiriva and Symbicort  For  your increasing cough with clear mucus production: Take the prednisone taper prescribed  If your symptoms have not improved after prednisone and you do not have sleep apnea on a sleep test then you should follow-up with cardiology  If your sleep study is abnormal we will have you come back to see [place] after the test, otherwise we will plan on seeing you back in 3 months   Current Outpatient Prescriptions:  .  albuterol (PROAIR HFA) 108 (90 Base) MCG/ACT inhaler, Inhale 2 puffs into the lungs every 4 (four) hours as needed for wheezing or shortness of breath., Disp: 3 Inhaler, Rfl: 0 .  allopurinol (ZYLOPRIM) 300 MG tablet, Take 300 mg by mouth daily., Disp: , Rfl:  .  ALPRAZolam (XANAX) 0.5 MG tablet, Take 1 tablet (0.5 mg total) by mouth 2 (two) times daily. (Patient taking differently: Take 0.5 mg by mouth at bedtime. ), Disp: 60 tablet, Rfl: 0 .  ALPRAZolam (XANAX) 0.5 MG tablet, Take 1 tablet 30 min prior to CT scan., Disp: 1 tablet, Rfl: 0 .  amLODipine (NORVASC) 10 MG tablet, Take 10 mg by mouth daily. , Disp: , Rfl:  .  atorvastatin (LIPITOR) 20 MG tablet, Take 20 mg by mouth daily., Disp: , Rfl:  .  azelastine (ASTELIN) 0.1 %  nasal spray, Place 1 spray into both nostrils 2 (two) times daily. Once daily, Disp: , Rfl: 2 .  budesonide-formoterol (SYMBICORT) 160-4.5 MCG/ACT inhaler, INHALE 2 PUFFS INTO THE LUNGS TWICE A DAY, Disp: 30.6 g, Rfl: 3 .  ELIQUIS 5 MG TABS tablet, Take 5 mg by mouth 2 (two) times daily., Disp: , Rfl: 0 .  fish oil-omega-3 fatty acids 1000 MG capsule, Take 1 capsule by mouth 3 (three) times daily.  , Disp: , Rfl:  .  flecainide (TAMBOCOR) 100 MG tablet, Take 100 mg by mouth 2 (two) times daily., Disp: , Rfl: 0 .  furosemide (LASIX) 40 MG  tablet, Take 40 mg by mouth daily. , Disp: , Rfl:  .  Linaclotide (LINZESS) 145 MCG CAPS capsule, Take 145 mcg by mouth daily., Disp: , Rfl:  .  metoprolol succinate (TOPROL-XL) 50 MG 24 hr tablet, Take 50 mg by mouth daily. Take with or immediately following a meal., Disp: , Rfl:  .  metroNIDAZOLE (METROGEL) 0.75 % gel, apply to affected area once daily, Disp: , Rfl: 0 .  Multiple Vitamin (MULITIVITAMIN WITH MINERALS) TABS, Take 1 tablet by mouth daily., Disp: , Rfl:  .  potassium chloride (KLOR-CON) 20 MEQ packet, Take 20 mEq by mouth daily., Disp: 3 tablet, Rfl: 0 .  roflumilast (DALIRESP) 500 MCG TABS tablet, Take 1 tablet (500 mcg total) by mouth daily., Disp: 90 tablet, Rfl: 3 .  rOPINIRole (REQUIP) 0.5 MG tablet, Take 1 tablet (0.5 mg total) by mouth daily., Disp: 30 tablet, Rfl: 0 .  temazepam (RESTORIL) 15 MG capsule, Take 15 mg by mouth at bedtime as needed for sleep. , Disp: , Rfl:  .  tiotropium (SPIRIVA HANDIHALER) 18 MCG inhalation capsule, INHALE THE CONTENTS OF 1 CAPSULE DAILY, Disp: 90 capsule, Rfl: 3 .  traMADol (ULTRAM) 50 MG tablet, Take 50 mg by mouth 3 (three) times daily as needed for moderate pain. , Disp: , Rfl: 0 .  valsartan-hydrochlorothiazide (DIOVAN-HCT) 320-25 MG per tablet, Take 1 tablet by mouth daily., Disp: , Rfl:

## 2017-03-30 NOTE — Patient Instructions (Addendum)
For the fatigue you have been experiencing: We will check a home sleep study We will check a blood thyroid test  Four-year chronic respiratory failure with hypoxemia Keep using 3 L of oxygen at rest Continues to use 5 L with exertion We will check her oxygen level while walking on 5 L today  For your severe COPD: Continue taking Spiriva and Symbicort  For  your increasing cough with clear mucus production: Take the prednisone taper prescribed  If your symptoms have not improved after prednisone and you do not have sleep apnea on a sleep test then you should follow-up with cardiology  If your sleep study is abnormal we will have you come back to see [place] after the test, otherwise we will plan on seeing you back in 3 months

## 2017-03-30 NOTE — Addendum Note (Signed)
Addended by: Len Blalock on: 03/30/2017 11:55 AM   Modules accepted: Orders

## 2017-04-01 ENCOUNTER — Encounter (HOSPITAL_COMMUNITY)
Admission: RE | Admit: 2017-04-01 | Discharge: 2017-04-01 | Disposition: A | Discharge status: Home / Self Care | Payer: Self-pay | Source: Ambulatory Visit | Attending: Internal Medicine | Admitting: Internal Medicine

## 2017-04-06 ENCOUNTER — Encounter (HOSPITAL_COMMUNITY)
Admission: RE | Admit: 2017-04-06 | Discharge: 2017-04-06 | Disposition: A | Discharge status: Home / Self Care | Payer: Self-pay | Source: Ambulatory Visit | Attending: Internal Medicine | Admitting: Internal Medicine

## 2017-04-08 ENCOUNTER — Encounter (HOSPITAL_COMMUNITY)
Admission: RE | Admit: 2017-04-08 | Discharge: 2017-04-08 | Disposition: A | Discharge status: Home / Self Care | Payer: Self-pay | Source: Ambulatory Visit | Attending: Internal Medicine | Admitting: Internal Medicine

## 2017-04-13 ENCOUNTER — Encounter (HOSPITAL_COMMUNITY)
Admission: RE | Admit: 2017-04-13 | Discharge: 2017-04-13 | Disposition: A | Discharge status: Home / Self Care | Payer: Self-pay | Source: Ambulatory Visit | Attending: Internal Medicine | Admitting: Internal Medicine

## 2017-04-13 DIAGNOSIS — I10 Essential (primary) hypertension: Secondary | ICD-10-CM | POA: Insufficient documentation

## 2017-04-13 DIAGNOSIS — J9611 Chronic respiratory failure with hypoxia: Secondary | ICD-10-CM | POA: Insufficient documentation

## 2017-04-13 DIAGNOSIS — J439 Emphysema, unspecified: Secondary | ICD-10-CM | POA: Insufficient documentation

## 2017-04-15 ENCOUNTER — Encounter (HOSPITAL_COMMUNITY)
Admission: RE | Admit: 2017-04-15 | Discharge: 2017-04-15 | Disposition: A | Discharge status: Home / Self Care | Payer: Self-pay | Source: Ambulatory Visit | Attending: Internal Medicine | Admitting: Internal Medicine

## 2017-04-20 ENCOUNTER — Encounter (HOSPITAL_COMMUNITY): Payer: Self-pay

## 2017-04-22 ENCOUNTER — Encounter (HOSPITAL_COMMUNITY)
Admission: RE | Admit: 2017-04-22 | Discharge: 2017-04-22 | Disposition: A | Discharge status: Home / Self Care | Payer: Self-pay | Source: Ambulatory Visit | Attending: Internal Medicine | Admitting: Internal Medicine

## 2017-04-27 ENCOUNTER — Encounter (HOSPITAL_COMMUNITY)
Admission: RE | Admit: 2017-04-27 | Discharge: 2017-04-27 | Disposition: A | Discharge status: Home / Self Care | Payer: Self-pay | Source: Ambulatory Visit | Attending: Internal Medicine | Admitting: Internal Medicine

## 2017-04-29 ENCOUNTER — Encounter (HOSPITAL_COMMUNITY)
Admission: RE | Admit: 2017-04-29 | Discharge: 2017-04-29 | Disposition: A | Discharge status: Home / Self Care | Payer: Self-pay | Source: Ambulatory Visit | Attending: Internal Medicine | Admitting: Internal Medicine

## 2017-05-04 ENCOUNTER — Encounter (HOSPITAL_COMMUNITY)
Admission: RE | Admit: 2017-05-04 | Discharge: 2017-05-04 | Disposition: A | Discharge status: Home / Self Care | Payer: Self-pay | Source: Ambulatory Visit | Attending: Internal Medicine | Admitting: Internal Medicine

## 2017-05-06 ENCOUNTER — Encounter (HOSPITAL_COMMUNITY)
Admission: RE | Admit: 2017-05-06 | Discharge: 2017-05-06 | Disposition: A | Discharge status: Home / Self Care | Payer: BLUE CROSS/BLUE SHIELD | Source: Ambulatory Visit | Attending: Internal Medicine | Admitting: Internal Medicine

## 2017-05-11 ENCOUNTER — Encounter (HOSPITAL_COMMUNITY)
Admission: RE | Admit: 2017-05-11 | Discharge: 2017-05-11 | Disposition: A | Discharge status: Home / Self Care | Payer: Self-pay | Source: Ambulatory Visit | Attending: Internal Medicine | Admitting: Internal Medicine

## 2017-05-11 DIAGNOSIS — I10 Essential (primary) hypertension: Secondary | ICD-10-CM | POA: Insufficient documentation

## 2017-05-11 DIAGNOSIS — J439 Emphysema, unspecified: Secondary | ICD-10-CM | POA: Insufficient documentation

## 2017-05-11 DIAGNOSIS — J9611 Chronic respiratory failure with hypoxia: Secondary | ICD-10-CM | POA: Insufficient documentation

## 2017-05-13 ENCOUNTER — Other Ambulatory Visit: Payer: Self-pay | Admitting: Pulmonary Disease

## 2017-05-13 ENCOUNTER — Encounter (HOSPITAL_COMMUNITY)
Admission: RE | Admit: 2017-05-13 | Discharge: 2017-05-13 | Disposition: A | Discharge status: Home / Self Care | Payer: Self-pay | Source: Ambulatory Visit | Attending: Internal Medicine | Admitting: Internal Medicine

## 2017-05-18 ENCOUNTER — Encounter (HOSPITAL_COMMUNITY)
Admission: RE | Admit: 2017-05-18 | Discharge: 2017-05-18 | Disposition: A | Discharge status: Home / Self Care | Payer: Self-pay | Source: Ambulatory Visit | Attending: Internal Medicine | Admitting: Internal Medicine

## 2017-05-18 ENCOUNTER — Telehealth: Payer: Self-pay | Admitting: Pulmonary Disease

## 2017-05-18 MED ORDER — ALBUTEROL SULFATE HFA 108 (90 BASE) MCG/ACT IN AERS: INHALATION_SPRAY | RESPIRATORY_TRACT | 1 refills | Status: DC

## 2017-05-18 NOTE — Telephone Encounter (Signed)
Spoke with pt. He is requesting a refill on Proair. This has been sent in for a 90 day supply per his request. Nothing further was needed.

## 2017-05-20 ENCOUNTER — Encounter (HOSPITAL_COMMUNITY)
Admission: RE | Admit: 2017-05-20 | Discharge: 2017-05-20 | Disposition: A | Discharge status: Home / Self Care | Payer: Self-pay | Source: Ambulatory Visit | Attending: Internal Medicine | Admitting: Internal Medicine

## 2017-05-25 ENCOUNTER — Encounter (HOSPITAL_COMMUNITY)
Admission: RE | Admit: 2017-05-25 | Discharge: 2017-05-25 | Disposition: A | Discharge status: Home / Self Care | Payer: Self-pay | Source: Ambulatory Visit | Attending: Internal Medicine | Admitting: Internal Medicine

## 2017-05-27 ENCOUNTER — Encounter (HOSPITAL_COMMUNITY)
Admission: RE | Admit: 2017-05-27 | Discharge: 2017-05-27 | Disposition: A | Discharge status: Home / Self Care | Payer: Self-pay | Source: Ambulatory Visit | Attending: Internal Medicine | Admitting: Internal Medicine

## 2017-06-01 ENCOUNTER — Encounter (HOSPITAL_BASED_OUTPATIENT_CLINIC_OR_DEPARTMENT_OTHER): Payer: BLUE CROSS/BLUE SHIELD

## 2017-06-01 ENCOUNTER — Encounter (HOSPITAL_COMMUNITY): Payer: Self-pay

## 2017-06-03 ENCOUNTER — Encounter (HOSPITAL_COMMUNITY): Payer: Self-pay

## 2017-06-08 ENCOUNTER — Encounter (HOSPITAL_COMMUNITY): Payer: Self-pay

## 2017-06-10 ENCOUNTER — Encounter (HOSPITAL_COMMUNITY): Payer: Self-pay

## 2017-06-10 DIAGNOSIS — J439 Emphysema, unspecified: Secondary | ICD-10-CM | POA: Insufficient documentation

## 2017-06-10 DIAGNOSIS — J9611 Chronic respiratory failure with hypoxia: Secondary | ICD-10-CM | POA: Insufficient documentation

## 2017-06-10 DIAGNOSIS — I10 Essential (primary) hypertension: Secondary | ICD-10-CM | POA: Insufficient documentation

## 2017-06-11 ENCOUNTER — Encounter: Payer: Self-pay | Admitting: Pulmonary Disease

## 2017-06-11 ENCOUNTER — Ambulatory Visit (INDEPENDENT_AMBULATORY_CARE_PROVIDER_SITE_OTHER): Payer: BLUE CROSS/BLUE SHIELD | Admitting: Pulmonary Disease

## 2017-06-11 VITALS — BP 134/62 | HR 62 | Ht 74.0 in | Wt 292.8 lb

## 2017-06-11 DIAGNOSIS — J432 Centrilobular emphysema: Secondary | ICD-10-CM | POA: Diagnosis not present

## 2017-06-11 DIAGNOSIS — R5383 Other fatigue: Secondary | ICD-10-CM | POA: Diagnosis not present

## 2017-06-11 DIAGNOSIS — J9611 Chronic respiratory failure with hypoxia: Secondary | ICD-10-CM | POA: Diagnosis not present

## 2017-06-11 NOTE — Patient Instructions (Signed)
For your COPD: Keep taking Symbicort and Spiriva Get a flu shot in the fall Stay active  For your chronic respiratory failure with hypoxemia: Keep taking 3 L of oxygen at rest, 5 L with exertion  For history of Afib and Sleepiness/fatigue: Please reschedule the sleep study  Follow-up with [place] in January 2018

## 2017-06-11 NOTE — Progress Notes (Signed)
Subjective:    Patient ID: Harold Hall, male    DOB: [birth year], 71 y.o.   MRN: [id number]  Synopsis: Former patient of Dr. Gwenette Greet with COPD. Dr. Gwenette Greet summarized his situation as follows: Stopped smoking 05/2011 Pulm rehab 2012, 2016, ongoing participation 2018 On exertional oxygen with portable concentrator> 3L rest, 5L with exertion +response to daliresp in past  HPI Chief Complaint  Patient presents with  . Follow-up    c/o increased mucus production (clear), attributes this to weather.  states sob is stable.     He is doing well right now.  Still using and benefitting from his oxygen at 3Lpm.  He will increase the O2 to 4-5 with exertion.  No flares of COPD since the last visit.  He is taking Spiriva and Symbicort regularly.  He needs albuterol once per week.  He is still going to pulmonary rehab regularly. Uses Saline spray prn.    Past Medical History:  Diagnosis Date  . Allergic rhinitis   . Arthritis   . Cancer [facility])    left renal -solitary kidney  . COPD (chronic obstructive pulmonary disease) ([place])   . Erectile dysfunction   . Gout   . Hernia   . History of kidney cancer   . Hypercholesterolemia   . Hypertension   . RLS (restless legs syndrome)   . Sinus problem       Review of Systems  Constitutional: Negative for chills, fatigue and fever.  HENT: Negative for postnasal drip, rhinorrhea and sinus pressure.   Respiratory: Positive for shortness of breath. Negative for cough and wheezing.   Cardiovascular: Positive for leg swelling. Negative for chest pain and palpitations.       Objective:   Physical Exam Vitals:   06/11/17 0857  BP: 134/62  Pulse: 62  SpO2: 93%  Weight: 292 lb 12.8 oz (132.8 kg)  Height: 6\' 2"  (1.88 m)   RA  Gen: well appearing HENT: OP clear, TM's clear, neck supple PULM: Poor air movement B, normal percussion CV: RRR, no mgr, trace edema GI: BS+, soft, nontender Derm: no cyanosis or rash Psyche: normal mood and  affect    Imaging: December 2017 chest CT images personal reviewed showing significant emphysema with an upper lobe predominance, no clear interstitial lung disease.  Blood: 09/2016 IgE 4   PFT: [facility] 2007:  FEV1 2.10 (46%), ratio 47 PFT 2017 Ratio 47%, FEV1 1.76L (46% pred), FVC 3.78L, TLC 6.45L (82% pred), DLCO 13.18  CBC    Component Value Date/Time   WBC 8.4 10/06/2016 1306   RBC 5.74 10/06/2016 1306   HGB 16.8 10/06/2016 1306   HCT 49.6 10/06/2016 1306   PLT 190.0 10/06/2016 1306   MCV 86.3 10/06/2016 1306   MCH 28.8 06/10/2011 0904   MCHC 33.8 10/06/2016 1306   RDW 15.6 (H) 10/06/2016 1306   LYMPHSABS 1.1 06/10/2011 0904   MONOABS 1.0 06/10/2011 0904   EOSABS 0.3 06/10/2011 0904   BASOSABS 0.0 06/10/2011 0904        Assessment & Plan:   Other fatigue  Chronic respiratory failure with hypoxia (HCC)  Centrilobular emphysema (HCC)  Discussion: This is been a stable interval for him. He has not had an exacerbation since the last visit. His follow-up visit with cardiology yesterday was okay. I encouraged him to stay active. Get a flu shot call.  Plan: For your COPD: Keep taking Symbicort and Spiriva Get a flu shot in the fall Stay active  For your chronic  respiratory failure with hypoxemia: Keep taking 3 L of oxygen at rest, 5 L with exertion  For history of Afib and Sleepiness/fatigue: Please reschedule the sleep study  Follow-up with [place] in January 2018    Current Outpatient Prescriptions:  .  albuterol (PROAIR HFA) 108 (90 Base) MCG/ACT inhaler, INHALE 2 PUFFS BY MOUTH INTO THE LUNGS EVERY 4 HOURS AS NEEDED FOR WHEEZING OR SHORTNESS OF BREATH, Disp: 3 Inhaler, Rfl: 1 .  allopurinol (ZYLOPRIM) 300 MG tablet, Take 300 mg by mouth daily., Disp: , Rfl:  .  ALPRAZolam (XANAX) 0.5 MG tablet, Take 1 tablet (0.5 mg total) by mouth 2 (two) times daily. (Patient taking differently: Take 0.5 mg by mouth at bedtime. ), Disp: 60 tablet, Rfl: 0 .  amLODipine  (NORVASC) 10 MG tablet, Take 10 mg by mouth daily. , Disp: , Rfl:  .  atorvastatin (LIPITOR) 20 MG tablet, Take 20 mg by mouth daily., Disp: , Rfl:  .  azelastine (ASTELIN) 0.1 % nasal spray, Place 1 spray into both nostrils 2 (two) times daily. Once daily, Disp: , Rfl: 2 .  budesonide-formoterol (SYMBICORT) 160-4.5 MCG/ACT inhaler, INHALE 2 PUFFS INTO THE LUNGS TWICE A DAY, Disp: 30.6 g, Rfl: 3 .  ELIQUIS 5 MG TABS tablet, Take 5 mg by mouth 2 (two) times daily., Disp: , Rfl: 0 .  fish oil-omega-3 fatty acids 1000 MG capsule, Take 1 capsule by mouth 3 (three) times daily.  , Disp: , Rfl:  .  flecainide (TAMBOCOR) 100 MG tablet, Take 100 mg by mouth 2 (two) times daily., Disp: , Rfl: 0 .  furosemide (LASIX) 40 MG tablet, Take 40 mg by mouth daily. , Disp: , Rfl:  .  Linaclotide (LINZESS) 145 MCG CAPS capsule, Take 145 mcg by mouth daily., Disp: , Rfl:  .  metoprolol succinate (TOPROL-XL) 50 MG 24 hr tablet, Take 50 mg by mouth daily. Take with or immediately following a meal., Disp: , Rfl:  .  Multiple Vitamin (MULITIVITAMIN WITH MINERALS) TABS, Take 1 tablet by mouth daily., Disp: , Rfl:  .  nitroGLYCERIN (NITROSTAT) 0.4 MG SL tablet, Place 0.4 mg under the tongue every 5 (five) minutes as needed for chest pain., Disp: , Rfl:  .  potassium chloride (KLOR-CON) 20 MEQ packet, Take 20 mEq by mouth daily., Disp: 3 tablet, Rfl: 0 .  roflumilast (DALIRESP) 500 MCG TABS tablet, Take 1 tablet (500 mcg total) by mouth daily., Disp: 90 tablet, Rfl: 3 .  rOPINIRole (REQUIP) 0.5 MG tablet, Take 1 tablet (0.5 mg total) by mouth daily., Disp: 30 tablet, Rfl: 0 .  temazepam (RESTORIL) 15 MG capsule, Take 15 mg by mouth at bedtime as needed for sleep. , Disp: , Rfl:  .  tiotropium (SPIRIVA HANDIHALER) 18 MCG inhalation capsule, INHALE THE CONTENTS OF 1 CAPSULE DAILY, Disp: 90 capsule, Rfl: 3 .  traMADol (ULTRAM) 50 MG tablet, Take 50 mg by mouth 3 (three) times daily as needed for moderate pain. , Disp: , Rfl:  0 .  valsartan-hydrochlorothiazide (DIOVAN-HCT) 320-25 MG per tablet, Take 1 tablet by mouth daily., Disp: , Rfl:

## 2017-06-15 ENCOUNTER — Encounter (HOSPITAL_COMMUNITY)
Admission: RE | Admit: 2017-06-15 | Discharge: 2017-06-15 | Disposition: A | Discharge status: Home / Self Care | Payer: Self-pay | Source: Ambulatory Visit | Attending: Internal Medicine | Admitting: Internal Medicine

## 2017-06-17 ENCOUNTER — Encounter (HOSPITAL_COMMUNITY)
Admission: RE | Admit: 2017-06-17 | Discharge: 2017-06-17 | Disposition: A | Discharge status: Home / Self Care | Payer: Self-pay | Source: Ambulatory Visit | Attending: Internal Medicine | Admitting: Internal Medicine

## 2017-06-22 ENCOUNTER — Encounter (HOSPITAL_COMMUNITY)
Admission: RE | Admit: 2017-06-22 | Discharge: 2017-06-22 | Disposition: A | Discharge status: Home / Self Care | Payer: Self-pay | Source: Ambulatory Visit | Attending: Internal Medicine | Admitting: Internal Medicine

## 2017-06-24 ENCOUNTER — Encounter (HOSPITAL_COMMUNITY)
Admission: RE | Admit: 2017-06-24 | Discharge: 2017-06-24 | Disposition: A | Discharge status: Home / Self Care | Payer: Self-pay | Source: Ambulatory Visit | Attending: Internal Medicine | Admitting: Internal Medicine

## 2017-06-29 ENCOUNTER — Encounter (HOSPITAL_COMMUNITY)
Admission: RE | Admit: 2017-06-29 | Discharge: 2017-06-29 | Disposition: A | Discharge status: Home / Self Care | Payer: Self-pay | Source: Ambulatory Visit | Attending: Internal Medicine | Admitting: Internal Medicine

## 2017-07-01 ENCOUNTER — Encounter (HOSPITAL_COMMUNITY)
Admission: RE | Admit: 2017-07-01 | Discharge: 2017-07-01 | Disposition: A | Discharge status: Home / Self Care | Payer: Self-pay | Source: Ambulatory Visit | Attending: Internal Medicine | Admitting: Internal Medicine

## 2017-07-13 ENCOUNTER — Encounter (HOSPITAL_COMMUNITY)
Admission: RE | Admit: 2017-07-13 | Discharge: 2017-07-13 | Disposition: A | Discharge status: Home / Self Care | Payer: Self-pay | Source: Ambulatory Visit | Attending: Internal Medicine | Admitting: Internal Medicine

## 2017-07-13 DIAGNOSIS — I10 Essential (primary) hypertension: Secondary | ICD-10-CM | POA: Insufficient documentation

## 2017-07-13 DIAGNOSIS — J9611 Chronic respiratory failure with hypoxia: Secondary | ICD-10-CM | POA: Insufficient documentation

## 2017-07-13 DIAGNOSIS — J439 Emphysema, unspecified: Secondary | ICD-10-CM | POA: Insufficient documentation

## 2017-07-15 ENCOUNTER — Encounter (HOSPITAL_COMMUNITY)
Admission: RE | Admit: 2017-07-15 | Discharge: 2017-07-15 | Disposition: A | Discharge status: Home / Self Care | Payer: Self-pay | Source: Ambulatory Visit | Attending: Internal Medicine | Admitting: Internal Medicine

## 2017-07-20 ENCOUNTER — Encounter (HOSPITAL_COMMUNITY): Payer: Self-pay

## 2017-07-22 ENCOUNTER — Encounter (HOSPITAL_COMMUNITY): Payer: Self-pay

## 2017-07-27 ENCOUNTER — Encounter (HOSPITAL_COMMUNITY)
Admission: RE | Admit: 2017-07-27 | Discharge: 2017-07-27 | Disposition: A | Discharge status: Home / Self Care | Payer: Self-pay | Source: Ambulatory Visit | Attending: Internal Medicine | Admitting: Internal Medicine

## 2017-07-29 ENCOUNTER — Encounter (HOSPITAL_COMMUNITY)
Admission: RE | Admit: 2017-07-29 | Discharge: 2017-07-29 | Disposition: A | Discharge status: Home / Self Care | Payer: Self-pay | Source: Ambulatory Visit | Attending: Internal Medicine | Admitting: Internal Medicine

## 2017-08-03 ENCOUNTER — Encounter (HOSPITAL_COMMUNITY): Payer: Self-pay

## 2017-08-05 ENCOUNTER — Encounter (HOSPITAL_COMMUNITY): Payer: Self-pay

## 2017-08-10 ENCOUNTER — Encounter (HOSPITAL_COMMUNITY)
Admission: RE | Admit: 2017-08-10 | Discharge: 2017-08-10 | Disposition: A | Discharge status: Home / Self Care | Payer: Self-pay | Source: Ambulatory Visit | Attending: Internal Medicine | Admitting: Internal Medicine

## 2017-08-10 DIAGNOSIS — J9611 Chronic respiratory failure with hypoxia: Secondary | ICD-10-CM | POA: Insufficient documentation

## 2017-08-10 DIAGNOSIS — I10 Essential (primary) hypertension: Secondary | ICD-10-CM | POA: Insufficient documentation

## 2017-08-10 DIAGNOSIS — J439 Emphysema, unspecified: Secondary | ICD-10-CM | POA: Insufficient documentation

## 2017-08-12 ENCOUNTER — Encounter (HOSPITAL_COMMUNITY)
Admission: RE | Admit: 2017-08-12 | Discharge: 2017-08-12 | Disposition: A | Discharge status: Home / Self Care | Payer: Self-pay | Source: Ambulatory Visit | Attending: Internal Medicine | Admitting: Internal Medicine

## 2017-08-17 ENCOUNTER — Other Ambulatory Visit: Payer: Self-pay | Admitting: Pulmonary Disease

## 2017-08-17 ENCOUNTER — Encounter (HOSPITAL_COMMUNITY): Payer: Self-pay

## 2017-08-19 ENCOUNTER — Encounter (HOSPITAL_COMMUNITY)
Admission: RE | Admit: 2017-08-19 | Discharge: 2017-08-19 | Disposition: A | Discharge status: Home / Self Care | Payer: Self-pay | Source: Ambulatory Visit | Attending: Internal Medicine | Admitting: Internal Medicine

## 2017-08-24 ENCOUNTER — Encounter (HOSPITAL_COMMUNITY): Payer: Self-pay

## 2017-08-26 ENCOUNTER — Encounter (HOSPITAL_COMMUNITY)
Admission: RE | Admit: 2017-08-26 | Discharge: 2017-08-26 | Disposition: A | Discharge status: Home / Self Care | Payer: Self-pay | Source: Ambulatory Visit | Attending: Internal Medicine | Admitting: Internal Medicine

## 2017-08-31 ENCOUNTER — Encounter (HOSPITAL_COMMUNITY)
Admission: RE | Admit: 2017-08-31 | Discharge: 2017-08-31 | Disposition: A | Discharge status: Home / Self Care | Payer: Self-pay | Source: Ambulatory Visit | Attending: Internal Medicine | Admitting: Internal Medicine

## 2017-09-02 ENCOUNTER — Encounter (HOSPITAL_COMMUNITY): Payer: Self-pay

## 2017-09-07 ENCOUNTER — Encounter (HOSPITAL_COMMUNITY)
Admission: RE | Admit: 2017-09-07 | Discharge: 2017-09-07 | Disposition: A | Discharge status: Home / Self Care | Payer: Self-pay | Source: Ambulatory Visit | Attending: Internal Medicine | Admitting: Internal Medicine

## 2017-09-09 ENCOUNTER — Encounter (HOSPITAL_COMMUNITY)
Admission: RE | Admit: 2017-09-09 | Discharge: 2017-09-09 | Disposition: A | Discharge status: Home / Self Care | Payer: Self-pay | Source: Ambulatory Visit | Attending: Internal Medicine | Admitting: Internal Medicine

## 2017-09-09 DIAGNOSIS — J9611 Chronic respiratory failure with hypoxia: Secondary | ICD-10-CM | POA: Insufficient documentation

## 2017-09-09 DIAGNOSIS — J439 Emphysema, unspecified: Secondary | ICD-10-CM | POA: Insufficient documentation

## 2017-09-09 DIAGNOSIS — I10 Essential (primary) hypertension: Secondary | ICD-10-CM | POA: Insufficient documentation

## 2017-09-14 ENCOUNTER — Encounter (HOSPITAL_COMMUNITY)
Admission: RE | Admit: 2017-09-14 | Discharge: 2017-09-14 | Disposition: A | Discharge status: Home / Self Care | Payer: Self-pay | Source: Ambulatory Visit | Attending: Internal Medicine | Admitting: Internal Medicine

## 2017-09-16 ENCOUNTER — Encounter (HOSPITAL_COMMUNITY)
Admission: RE | Admit: 2017-09-16 | Discharge: 2017-09-16 | Disposition: A | Discharge status: Home / Self Care | Payer: Self-pay | Source: Ambulatory Visit | Attending: Internal Medicine | Admitting: Internal Medicine

## 2017-09-21 ENCOUNTER — Encounter (HOSPITAL_COMMUNITY): Payer: Self-pay

## 2017-09-23 ENCOUNTER — Encounter (HOSPITAL_COMMUNITY)
Admission: RE | Admit: 2017-09-23 | Discharge: 2017-09-23 | Disposition: A | Discharge status: Home / Self Care | Payer: BLUE CROSS/BLUE SHIELD | Source: Ambulatory Visit | Attending: Internal Medicine | Admitting: Internal Medicine

## 2017-09-28 ENCOUNTER — Encounter (HOSPITAL_COMMUNITY)
Admission: RE | Admit: 2017-09-28 | Discharge: 2017-09-28 | Disposition: A | Discharge status: Home / Self Care | Payer: Self-pay | Source: Ambulatory Visit | Attending: Internal Medicine | Admitting: Internal Medicine

## 2017-09-29 ENCOUNTER — Other Ambulatory Visit: Payer: Self-pay | Admitting: Pulmonary Disease

## 2017-10-05 ENCOUNTER — Encounter (HOSPITAL_COMMUNITY): Payer: Self-pay

## 2017-10-07 ENCOUNTER — Encounter (HOSPITAL_COMMUNITY)
Admission: RE | Admit: 2017-10-07 | Discharge: 2017-10-07 | Disposition: A | Discharge status: Home / Self Care | Payer: Self-pay | Source: Ambulatory Visit | Attending: Internal Medicine | Admitting: Internal Medicine

## 2017-10-12 ENCOUNTER — Encounter (HOSPITAL_COMMUNITY)
Admission: RE | Admit: 2017-10-12 | Discharge: 2017-10-12 | Disposition: A | Discharge status: Home / Self Care | Payer: Self-pay | Source: Ambulatory Visit | Attending: Internal Medicine | Admitting: Internal Medicine

## 2017-10-12 DIAGNOSIS — J9611 Chronic respiratory failure with hypoxia: Secondary | ICD-10-CM | POA: Insufficient documentation

## 2017-10-12 DIAGNOSIS — J439 Emphysema, unspecified: Secondary | ICD-10-CM | POA: Insufficient documentation

## 2017-10-12 DIAGNOSIS — I10 Essential (primary) hypertension: Secondary | ICD-10-CM | POA: Insufficient documentation

## 2017-10-14 ENCOUNTER — Encounter (HOSPITAL_COMMUNITY)
Admission: RE | Admit: 2017-10-14 | Discharge: 2017-10-14 | Disposition: A | Discharge status: Home / Self Care | Payer: Self-pay | Source: Ambulatory Visit | Attending: Internal Medicine | Admitting: Internal Medicine

## 2017-10-19 ENCOUNTER — Encounter (HOSPITAL_COMMUNITY): Payer: Self-pay

## 2017-10-21 ENCOUNTER — Encounter (HOSPITAL_COMMUNITY): Payer: Self-pay

## 2017-10-26 ENCOUNTER — Encounter (HOSPITAL_COMMUNITY)
Admission: RE | Admit: 2017-10-26 | Discharge: 2017-10-26 | Disposition: A | Discharge status: Home / Self Care | Payer: Self-pay | Source: Ambulatory Visit | Attending: Internal Medicine | Admitting: Internal Medicine

## 2017-10-28 ENCOUNTER — Encounter (HOSPITAL_COMMUNITY)
Admission: RE | Admit: 2017-10-28 | Discharge: 2017-10-28 | Disposition: A | Discharge status: Home / Self Care | Payer: Self-pay | Source: Ambulatory Visit | Attending: Internal Medicine | Admitting: Internal Medicine

## 2017-11-04 ENCOUNTER — Encounter (HOSPITAL_COMMUNITY): Payer: Self-pay

## 2017-11-04 ENCOUNTER — Other Ambulatory Visit: Payer: Self-pay | Admitting: Pulmonary Disease

## 2017-11-11 ENCOUNTER — Encounter (HOSPITAL_COMMUNITY)
Admission: RE | Admit: 2017-11-11 | Discharge: 2017-11-11 | Disposition: A | Discharge status: Home / Self Care | Payer: Self-pay | Source: Ambulatory Visit | Attending: Internal Medicine | Admitting: Internal Medicine

## 2017-11-11 DIAGNOSIS — J9611 Chronic respiratory failure with hypoxia: Secondary | ICD-10-CM | POA: Insufficient documentation

## 2017-11-11 DIAGNOSIS — I10 Essential (primary) hypertension: Secondary | ICD-10-CM | POA: Insufficient documentation

## 2017-11-11 DIAGNOSIS — J439 Emphysema, unspecified: Secondary | ICD-10-CM | POA: Insufficient documentation

## 2017-11-16 ENCOUNTER — Encounter (HOSPITAL_COMMUNITY)
Admission: RE | Admit: 2017-11-16 | Discharge: 2017-11-16 | Disposition: A | Discharge status: Home / Self Care | Payer: Self-pay | Source: Ambulatory Visit | Attending: Internal Medicine | Admitting: Internal Medicine

## 2017-11-18 ENCOUNTER — Encounter (HOSPITAL_COMMUNITY)
Admission: RE | Admit: 2017-11-18 | Discharge: 2017-11-18 | Disposition: A | Discharge status: Home / Self Care | Payer: Self-pay | Source: Ambulatory Visit | Attending: Internal Medicine | Admitting: Internal Medicine

## 2017-11-23 ENCOUNTER — Encounter (HOSPITAL_COMMUNITY)
Admission: RE | Admit: 2017-11-23 | Discharge: 2017-11-23 | Disposition: A | Discharge status: Home / Self Care | Payer: Self-pay | Source: Ambulatory Visit | Attending: Internal Medicine | Admitting: Internal Medicine

## 2017-11-25 ENCOUNTER — Encounter (HOSPITAL_COMMUNITY)
Admission: RE | Admit: 2017-11-25 | Discharge: 2017-11-25 | Disposition: A | Discharge status: Home / Self Care | Payer: Self-pay | Source: Ambulatory Visit | Attending: Internal Medicine | Admitting: Internal Medicine

## 2017-11-30 ENCOUNTER — Encounter (HOSPITAL_COMMUNITY)
Admission: RE | Admit: 2017-11-30 | Discharge: 2017-11-30 | Disposition: A | Discharge status: Home / Self Care | Payer: BLUE CROSS/BLUE SHIELD | Source: Ambulatory Visit | Attending: Internal Medicine | Admitting: Internal Medicine

## 2017-12-02 ENCOUNTER — Encounter (HOSPITAL_COMMUNITY): Payer: Self-pay

## 2017-12-06 ENCOUNTER — Ambulatory Visit (INDEPENDENT_AMBULATORY_CARE_PROVIDER_SITE_OTHER): Payer: Medicare Other | Admitting: Pulmonary Disease

## 2017-12-06 ENCOUNTER — Encounter: Payer: Self-pay | Admitting: Pulmonary Disease

## 2017-12-06 VITALS — BP 134/66 | HR 64 | Ht 74.0 in | Wt 295.6 lb

## 2017-12-06 DIAGNOSIS — J9611 Chronic respiratory failure with hypoxia: Secondary | ICD-10-CM

## 2017-12-06 DIAGNOSIS — I4891 Unspecified atrial fibrillation: Secondary | ICD-10-CM

## 2017-12-06 DIAGNOSIS — J439 Emphysema, unspecified: Secondary | ICD-10-CM

## 2017-12-06 NOTE — Progress Notes (Signed)
Subjective:    Patient ID: Harold Hall, male    DOB: 10-Aug-1946, 72 y.o.   MRN: [id number]  Synopsis: Former patient of Dr. Gwenette Greet with COPD. Dr. Gwenette Greet summarized his situation as follows: Stopped smoking 05/2011 Pulm rehab 2012, 2016, ongoing participation 2018 On exertional oxygen with portable concentrator> 3L rest, 5L with exertion +response to daliresp in past  HPI Chief Complaint  Patient presents with  . Follow-up    pt c/o stable DOE, prod cough with white/yellow mucus.     Harold Hall has been doing OK. Afib: > He has ben seeing Dr. Einar Gip and saw him recently for some chest pain.  He had an EKG > he says that he really hasn't had the chest pain since then > his Afib has been fine lately, rate controlled  COPD: > He says his dyspnea is about the same, maybe a little worse lately > he says that when he is in his exercise classes he is not doing quite as much weight as he used to > it takes longer for him to do a mile on the bike than prior > trying to walk from his house to his shop, some days harder than others > it has been a little worse with the moisture in the air > no bronchitis late > flu shot is up to date  Chronic respiratory failure with hypoxemia: > he is still using 3Lpm at rest, 4 Lpm with exertion; he will use 5Lpm at rehab with strenuous exercise  Past Medical History:  Diagnosis Date  . Allergic rhinitis   . Arthritis   . Cancer [facility])    left renal -solitary kidney  . COPD (chronic obstructive pulmonary disease) ([place])   . Erectile dysfunction   . Gout   . Hernia   . History of kidney cancer   . Hypercholesterolemia   . Hypertension   . RLS (restless legs syndrome)   . Sinus problem       Review of Systems  Constitutional: Negative for chills, fatigue and fever.  HENT: Negative for postnasal drip, rhinorrhea and sinus pressure.   Respiratory: Positive for shortness of breath. Negative for cough and wheezing.   Cardiovascular:  Positive for leg swelling. Negative for chest pain and palpitations.       Objective:   Physical Exam Vitals:   12/06/17 0936  BP: 134/66  Pulse: 64  SpO2: 92%  Weight: 295 lb 9.6 oz (134.1 kg)  Height: 6\' 2"  (1.88 m)     Gen: well appearing HENT: OP clear, TM's clear, neck supple PULM: Some wheezing B, normal percussion CV: RRR today, no mgr, trace edema GI: BS+, soft, nontender Derm: no cyanosis or rash Psyche: normal mood and affect     Imaging: December 2017 chest CT images personal reviewed showing significant emphysema with an upper lobe predominance, no clear interstitial lung disease.  Blood: 09/2016 IgE 4   PFT: [facility] 2007:  FEV1 2.10 (46%), ratio 47 PFT 2017 Ratio 47%, FEV1 1.76L (46% pred), FVC 3.78L, TLC 6.45L (82% pred), DLCO 13.18  CBC    Component Value Date/Time   WBC 8.4 10/06/2016 1306   RBC 5.74 10/06/2016 1306   HGB 16.8 10/06/2016 1306   HCT 49.6 10/06/2016 1306   PLT 190.0 10/06/2016 1306   MCV 86.3 10/06/2016 1306   MCH 28.8 06/10/2011 0904   MCHC 33.8 10/06/2016 1306   RDW 15.6 (H) 10/06/2016 1306   LYMPHSABS 1.1 06/10/2011 0904   MONOABS 1.0 06/10/2011 0904  EOSABS 0.3 06/10/2011 0904   BASOSABS 0.0 06/10/2011 2202        Assessment & Plan:   Chronic respiratory failure with hypoxia (HCC)  Pulmonary emphysema, unspecified emphysema type (HCC)  Atrial fibrillation, unspecified type ([place])  Discussion: This has been a stable interval for clearance.  He remains active with exercise.  He is compliant with his inhaled medicines.  No exacerbations recently.  He has experienced a slight decline in his ability to walk but this is not unexpected given the natural history of COPD  Severe COPD: Continue Symbicort Continue Spiriva Stay active I am glad your flu shot is up-to-date Continue exercising  Chronic respiratory failure with hypoxemia: Continue using 3-4 L of oxygen with minimal exertion, 5 L with heavy  exertion  Atrial fibrillation: Your heart seemed to be in regular rhythm for me today on physical exam Continue follow-up with Dr. Einar Gip  I will see you back in 4 months or sooner if needed    Current Outpatient Medications:  .  albuterol (PROAIR HFA) 108 (90 Base) MCG/ACT inhaler, INHALE 2 PUFFS BY MOUTH INTO THE LUNGS EVERY 4 HOURS AS NEEDED FOR WHEEZING OR SHORTNESS OF BREATH, Disp: 3 Inhaler, Rfl: 1 .  allopurinol (ZYLOPRIM) 300 MG tablet, Take 300 mg by mouth daily., Disp: , Rfl:  .  ALPRAZolam (XANAX) 0.5 MG tablet, Take 1 tablet (0.5 mg total) by mouth 2 (two) times daily. (Patient taking differently: Take 0.5 mg by mouth at bedtime. ), Disp: 60 tablet, Rfl: 0 .  amLODipine (NORVASC) 10 MG tablet, Take 10 mg by mouth daily. , Disp: , Rfl:  .  atorvastatin (LIPITOR) 20 MG tablet, Take 20 mg by mouth daily., Disp: , Rfl:  .  azelastine (ASTELIN) 0.1 % nasal spray, Place 1 spray into both nostrils 2 (two) times daily. Once daily, Disp: , Rfl: 2 .  DALIRESP 500 MCG TABS tablet, TAKE 1 TABLET BY MOUTH DAILY., Disp: 90 tablet, Rfl: 3 .  ELIQUIS 5 MG TABS tablet, Take 5 mg by mouth 2 (two) times daily., Disp: , Rfl: 0 .  fish oil-omega-3 fatty acids 1000 MG capsule, Take 1 capsule by mouth 3 (three) times daily.  , Disp: , Rfl:  .  flecainide (TAMBOCOR) 100 MG tablet, Take 100 mg by mouth 2 (two) times daily., Disp: , Rfl: 0 .  furosemide (LASIX) 40 MG tablet, Take 40 mg by mouth daily. , Disp: , Rfl:  .  Linaclotide (LINZESS) 145 MCG CAPS capsule, Take 145 mcg by mouth daily., Disp: , Rfl:  .  metoprolol succinate (TOPROL-XL) 50 MG 24 hr tablet, Take 50 mg by mouth daily. Take with or immediately following a meal., Disp: , Rfl:  .  Multiple Vitamin (MULITIVITAMIN WITH MINERALS) TABS, Take 1 tablet by mouth daily., Disp: , Rfl:  .  nitroGLYCERIN (NITROSTAT) 0.4 MG SL tablet, Place 0.4 mg under the tongue every 5 (five) minutes as needed for chest pain., Disp: , Rfl:  .  potassium chloride  (KLOR-CON) 20 MEQ packet, Take 20 mEq by mouth daily., Disp: 3 tablet, Rfl: 0 .  rOPINIRole (REQUIP) 0.5 MG tablet, Take 1 tablet (0.5 mg total) by mouth daily., Disp: 30 tablet, Rfl: 0 .  SYMBICORT 160-4.5 MCG/ACT inhaler, INHALE 2 PUFFS BY MOUTH INTO THE LUNGS TWICE A DAY, Disp: 30.6 g, Rfl: 0 .  temazepam (RESTORIL) 15 MG capsule, Take 15 mg by mouth at bedtime as needed for sleep. , Disp: , Rfl:  .  tiotropium (SPIRIVA HANDIHALER)  18 MCG inhalation capsule, INHALE THE CONTENTS OF 1 CAPSULE DAILY, Disp: 90 capsule, Rfl: 3 .  traMADol (ULTRAM) 50 MG tablet, Take 50 mg by mouth 3 (three) times daily as needed for moderate pain. , Disp: , Rfl: 0 .  valsartan-hydrochlorothiazide (DIOVAN-HCT) 320-25 MG per tablet, Take 1 tablet by mouth daily., Disp: , Rfl:

## 2017-12-06 NOTE — Patient Instructions (Signed)
Severe COPD: Continue Symbicort Continue Spiriva Stay active I am glad your flu shot is up-to-date Continue exercising  Chronic respiratory failure with hypoxemia: Continue using 3-4 L of oxygen with minimal exertion, 5 L with heavy exertion  Atrial fibrillation: Your heart seemed to be in regular rhythm for me today on physical exam Continue follow-up with Dr. Einar Gip  I will see you back in 4 months or sooner if needed

## 2017-12-07 ENCOUNTER — Encounter (HOSPITAL_COMMUNITY)
Admission: RE | Admit: 2017-12-07 | Discharge: 2017-12-07 | Disposition: A | Discharge status: Home / Self Care | Payer: Self-pay | Source: Ambulatory Visit | Attending: Internal Medicine | Admitting: Internal Medicine

## 2017-12-09 ENCOUNTER — Encounter (HOSPITAL_COMMUNITY)
Admission: RE | Admit: 2017-12-09 | Discharge: 2017-12-09 | Disposition: A | Discharge status: Home / Self Care | Payer: Self-pay | Source: Ambulatory Visit | Attending: Internal Medicine | Admitting: Internal Medicine

## 2017-12-14 ENCOUNTER — Encounter (HOSPITAL_COMMUNITY): Payer: Self-pay

## 2017-12-14 DIAGNOSIS — J439 Emphysema, unspecified: Secondary | ICD-10-CM | POA: Insufficient documentation

## 2017-12-14 DIAGNOSIS — I10 Essential (primary) hypertension: Secondary | ICD-10-CM | POA: Insufficient documentation

## 2017-12-14 DIAGNOSIS — J9611 Chronic respiratory failure with hypoxia: Secondary | ICD-10-CM | POA: Insufficient documentation

## 2017-12-16 ENCOUNTER — Encounter (HOSPITAL_COMMUNITY)
Admission: RE | Admit: 2017-12-16 | Discharge: 2017-12-16 | Disposition: A | Discharge status: Home / Self Care | Payer: Self-pay | Source: Ambulatory Visit | Attending: Internal Medicine | Admitting: Internal Medicine

## 2017-12-17 ENCOUNTER — Telehealth: Payer: Self-pay | Admitting: Pulmonary Disease

## 2017-12-17 MED ORDER — ROFLUMILAST 500 MCG PO TABS: 500.0000 ug | ORAL_TABLET | Freq: Every day | ORAL | 3 refills | Status: DC

## 2017-12-17 MED ORDER — BUDESONIDE-FORMOTEROL FUMARATE 160-4.5 MCG/ACT IN AERO: INHALATION_SPRAY | RESPIRATORY_TRACT | 3 refills | Status: DC

## 2017-12-17 MED ORDER — TIOTROPIUM BROMIDE MONOHYDRATE 18 MCG IN CAPS: ORAL_CAPSULE | RESPIRATORY_TRACT | 3 refills | Status: DC

## 2017-12-17 MED ORDER — ALBUTEROL SULFATE HFA 108 (90 BASE) MCG/ACT IN AERS: INHALATION_SPRAY | RESPIRATORY_TRACT | 3 refills | Status: DC

## 2017-12-17 NOTE — Telephone Encounter (Signed)
Pt requesting 90 day supply of inhalers and daliresp sent to [facility].  These have been sent to pharmacy.  Nothing further needed.

## 2017-12-21 ENCOUNTER — Encounter (HOSPITAL_COMMUNITY)
Admission: RE | Admit: 2017-12-21 | Discharge: 2017-12-21 | Disposition: A | Discharge status: Home / Self Care | Payer: Self-pay | Source: Ambulatory Visit | Attending: Internal Medicine | Admitting: Internal Medicine

## 2017-12-23 ENCOUNTER — Encounter (HOSPITAL_COMMUNITY): Payer: Self-pay

## 2017-12-24 ENCOUNTER — Telehealth: Payer: Self-pay | Admitting: Pulmonary Disease

## 2017-12-24 NOTE — Telephone Encounter (Signed)
PA request received by [facility] for Daliresp.   Initiated PA, which has been approved through 12/21/2020.  Case ID: [id number].  Nothing further needed.

## 2017-12-28 ENCOUNTER — Encounter (HOSPITAL_COMMUNITY): Payer: Self-pay

## 2017-12-28 DIAGNOSIS — L738 Other specified follicular disorders: Secondary | ICD-10-CM | POA: Diagnosis not present

## 2017-12-28 DIAGNOSIS — Z85820 Personal history of malignant melanoma of skin: Secondary | ICD-10-CM | POA: Diagnosis not present

## 2017-12-28 DIAGNOSIS — L280 Lichen simplex chronicus: Secondary | ICD-10-CM | POA: Diagnosis not present

## 2017-12-28 DIAGNOSIS — L218 Other seborrheic dermatitis: Secondary | ICD-10-CM | POA: Diagnosis not present

## 2017-12-28 DIAGNOSIS — Z85828 Personal history of other malignant neoplasm of skin: Secondary | ICD-10-CM | POA: Diagnosis not present

## 2017-12-28 DIAGNOSIS — L739 Follicular disorder, unspecified: Secondary | ICD-10-CM | POA: Diagnosis not present

## 2017-12-28 DIAGNOSIS — D485 Neoplasm of uncertain behavior of skin: Secondary | ICD-10-CM | POA: Diagnosis not present

## 2017-12-28 DIAGNOSIS — D225 Melanocytic nevi of trunk: Secondary | ICD-10-CM | POA: Diagnosis not present

## 2017-12-28 DIAGNOSIS — L570 Actinic keratosis: Secondary | ICD-10-CM | POA: Diagnosis not present

## 2017-12-30 ENCOUNTER — Encounter (HOSPITAL_COMMUNITY)
Admission: RE | Admit: 2017-12-30 | Discharge: 2017-12-30 | Disposition: A | Discharge status: Home / Self Care | Payer: Self-pay | Source: Ambulatory Visit | Attending: Internal Medicine | Admitting: Internal Medicine

## 2018-01-03 DIAGNOSIS — R0602 Shortness of breath: Secondary | ICD-10-CM | POA: Diagnosis not present

## 2018-01-03 DIAGNOSIS — R0789 Other chest pain: Secondary | ICD-10-CM | POA: Diagnosis not present

## 2018-01-03 DIAGNOSIS — J432 Centrilobular emphysema: Secondary | ICD-10-CM | POA: Diagnosis not present

## 2018-01-03 DIAGNOSIS — I480 Paroxysmal atrial fibrillation: Secondary | ICD-10-CM | POA: Diagnosis not present

## 2018-01-04 ENCOUNTER — Encounter (HOSPITAL_COMMUNITY)
Admission: RE | Admit: 2018-01-04 | Discharge: 2018-01-04 | Disposition: A | Discharge status: Home / Self Care | Payer: Self-pay | Source: Ambulatory Visit | Attending: Internal Medicine | Admitting: Internal Medicine

## 2018-01-06 ENCOUNTER — Encounter (HOSPITAL_COMMUNITY)
Admission: RE | Admit: 2018-01-06 | Discharge: 2018-01-06 | Disposition: A | Discharge status: Home / Self Care | Payer: Self-pay | Source: Ambulatory Visit | Attending: Internal Medicine | Admitting: Internal Medicine

## 2018-01-11 ENCOUNTER — Encounter (HOSPITAL_COMMUNITY)
Admission: RE | Admit: 2018-01-11 | Discharge: 2018-01-11 | Disposition: A | Discharge status: Home / Self Care | Payer: Self-pay | Source: Ambulatory Visit | Attending: Internal Medicine | Admitting: Internal Medicine

## 2018-01-11 DIAGNOSIS — J9611 Chronic respiratory failure with hypoxia: Secondary | ICD-10-CM | POA: Insufficient documentation

## 2018-01-11 DIAGNOSIS — I10 Essential (primary) hypertension: Secondary | ICD-10-CM | POA: Insufficient documentation

## 2018-01-11 DIAGNOSIS — J439 Emphysema, unspecified: Secondary | ICD-10-CM | POA: Insufficient documentation

## 2018-01-13 ENCOUNTER — Encounter (HOSPITAL_COMMUNITY): Payer: Self-pay

## 2018-01-14 DIAGNOSIS — H0102B Squamous blepharitis left eye, upper and lower eyelids: Secondary | ICD-10-CM | POA: Diagnosis not present

## 2018-01-14 DIAGNOSIS — H0102A Squamous blepharitis right eye, upper and lower eyelids: Secondary | ICD-10-CM | POA: Diagnosis not present

## 2018-01-18 ENCOUNTER — Encounter (HOSPITAL_COMMUNITY)
Admission: RE | Admit: 2018-01-18 | Discharge: 2018-01-18 | Disposition: A | Discharge status: Home / Self Care | Payer: Self-pay | Source: Ambulatory Visit | Attending: Internal Medicine | Admitting: Internal Medicine

## 2018-01-20 ENCOUNTER — Encounter (HOSPITAL_COMMUNITY)
Admission: RE | Admit: 2018-01-20 | Discharge: 2018-01-20 | Disposition: A | Discharge status: Home / Self Care | Payer: Self-pay | Source: Ambulatory Visit | Attending: Internal Medicine | Admitting: Internal Medicine

## 2018-01-25 ENCOUNTER — Encounter (HOSPITAL_COMMUNITY): Payer: Self-pay

## 2018-01-27 ENCOUNTER — Encounter (HOSPITAL_COMMUNITY)
Admission: RE | Admit: 2018-01-27 | Discharge: 2018-01-27 | Disposition: A | Discharge status: Home / Self Care | Payer: Self-pay | Source: Ambulatory Visit | Attending: Internal Medicine | Admitting: Internal Medicine

## 2018-02-01 ENCOUNTER — Encounter (HOSPITAL_COMMUNITY): Payer: Self-pay

## 2018-02-01 DIAGNOSIS — R0989 Other specified symptoms and signs involving the circulatory and respiratory systems: Secondary | ICD-10-CM | POA: Diagnosis not present

## 2018-02-03 ENCOUNTER — Encounter (HOSPITAL_COMMUNITY)
Admission: RE | Admit: 2018-02-03 | Discharge: 2018-02-03 | Disposition: A | Discharge status: Home / Self Care | Payer: Self-pay | Source: Ambulatory Visit | Attending: Internal Medicine | Admitting: Internal Medicine

## 2018-02-08 ENCOUNTER — Encounter (HOSPITAL_COMMUNITY)
Admission: RE | Admit: 2018-02-08 | Discharge: 2018-02-08 | Disposition: A | Discharge status: Home / Self Care | Payer: Self-pay | Source: Ambulatory Visit | Attending: Internal Medicine | Admitting: Internal Medicine

## 2018-02-08 DIAGNOSIS — J439 Emphysema, unspecified: Secondary | ICD-10-CM | POA: Insufficient documentation

## 2018-02-08 DIAGNOSIS — J9611 Chronic respiratory failure with hypoxia: Secondary | ICD-10-CM | POA: Insufficient documentation

## 2018-02-08 DIAGNOSIS — I10 Essential (primary) hypertension: Secondary | ICD-10-CM | POA: Insufficient documentation

## 2018-02-10 ENCOUNTER — Encounter (HOSPITAL_COMMUNITY)
Admission: RE | Admit: 2018-02-10 | Discharge: 2018-02-10 | Disposition: A | Discharge status: Home / Self Care | Payer: Self-pay | Source: Ambulatory Visit | Attending: Internal Medicine | Admitting: Internal Medicine

## 2018-02-15 ENCOUNTER — Encounter (HOSPITAL_COMMUNITY)
Admission: RE | Admit: 2018-02-15 | Discharge: 2018-02-15 | Disposition: A | Discharge status: Home / Self Care | Payer: Self-pay | Source: Ambulatory Visit | Attending: Internal Medicine | Admitting: Internal Medicine

## 2018-02-22 DIAGNOSIS — E669 Obesity, unspecified: Secondary | ICD-10-CM | POA: Diagnosis not present

## 2018-02-22 DIAGNOSIS — I10 Essential (primary) hypertension: Secondary | ICD-10-CM | POA: Diagnosis not present

## 2018-02-22 DIAGNOSIS — J449 Chronic obstructive pulmonary disease, unspecified: Secondary | ICD-10-CM | POA: Diagnosis not present

## 2018-02-22 DIAGNOSIS — M109 Gout, unspecified: Secondary | ICD-10-CM | POA: Diagnosis not present

## 2018-02-22 DIAGNOSIS — N183 Chronic kidney disease, stage 3 (moderate): Secondary | ICD-10-CM | POA: Diagnosis not present

## 2018-02-22 DIAGNOSIS — G2581 Restless legs syndrome: Secondary | ICD-10-CM | POA: Diagnosis not present

## 2018-02-22 DIAGNOSIS — G4700 Insomnia, unspecified: Secondary | ICD-10-CM | POA: Diagnosis not present

## 2018-02-22 DIAGNOSIS — E782 Mixed hyperlipidemia: Secondary | ICD-10-CM | POA: Diagnosis not present

## 2018-02-22 DIAGNOSIS — K5900 Constipation, unspecified: Secondary | ICD-10-CM | POA: Diagnosis not present

## 2018-02-22 DIAGNOSIS — F411 Generalized anxiety disorder: Secondary | ICD-10-CM | POA: Diagnosis not present

## 2018-02-22 DIAGNOSIS — I4891 Unspecified atrial fibrillation: Secondary | ICD-10-CM | POA: Diagnosis not present

## 2018-02-24 ENCOUNTER — Encounter (HOSPITAL_COMMUNITY)
Admission: RE | Admit: 2018-02-24 | Discharge: 2018-02-24 | Disposition: A | Discharge status: Home / Self Care | Payer: Self-pay | Source: Ambulatory Visit | Attending: Internal Medicine | Admitting: Internal Medicine

## 2018-03-03 ENCOUNTER — Encounter (HOSPITAL_COMMUNITY)
Admission: RE | Admit: 2018-03-03 | Discharge: 2018-03-03 | Disposition: A | Discharge status: Home / Self Care | Payer: Self-pay | Source: Ambulatory Visit | Attending: Pulmonary Disease | Admitting: Pulmonary Disease

## 2018-03-08 ENCOUNTER — Encounter (HOSPITAL_COMMUNITY)
Admission: RE | Admit: 2018-03-08 | Discharge: 2018-03-08 | Disposition: A | Discharge status: Home / Self Care | Payer: Self-pay | Source: Ambulatory Visit | Attending: Pulmonary Disease | Admitting: Pulmonary Disease

## 2018-03-10 ENCOUNTER — Encounter (HOSPITAL_COMMUNITY)
Admission: RE | Admit: 2018-03-10 | Discharge: 2018-03-10 | Disposition: A | Discharge status: Home / Self Care | Payer: Self-pay | Source: Ambulatory Visit | Attending: Internal Medicine | Admitting: Internal Medicine

## 2018-03-10 DIAGNOSIS — I10 Essential (primary) hypertension: Secondary | ICD-10-CM | POA: Insufficient documentation

## 2018-03-10 DIAGNOSIS — J9611 Chronic respiratory failure with hypoxia: Secondary | ICD-10-CM | POA: Insufficient documentation

## 2018-03-10 DIAGNOSIS — J439 Emphysema, unspecified: Secondary | ICD-10-CM | POA: Insufficient documentation

## 2018-03-14 DIAGNOSIS — Z5329 Procedure and treatment not carried out because of patient's decision for other reasons: Secondary | ICD-10-CM | POA: Diagnosis not present

## 2018-03-14 DIAGNOSIS — J441 Chronic obstructive pulmonary disease with (acute) exacerbation: Secondary | ICD-10-CM | POA: Diagnosis not present

## 2018-03-14 DIAGNOSIS — R0600 Dyspnea, unspecified: Secondary | ICD-10-CM | POA: Diagnosis not present

## 2018-03-14 DIAGNOSIS — R0902 Hypoxemia: Secondary | ICD-10-CM | POA: Diagnosis not present

## 2018-03-14 DIAGNOSIS — R5381 Other malaise: Secondary | ICD-10-CM | POA: Diagnosis not present

## 2018-03-21 ENCOUNTER — Ambulatory Visit (INDEPENDENT_AMBULATORY_CARE_PROVIDER_SITE_OTHER): Payer: Medicare Other | Admitting: Adult Health

## 2018-03-21 ENCOUNTER — Ambulatory Visit: Payer: Medicare Other | Admitting: Pulmonary Disease

## 2018-03-21 ENCOUNTER — Telehealth (HOSPITAL_COMMUNITY): Payer: Self-pay | Admitting: *Deleted

## 2018-03-21 ENCOUNTER — Ambulatory Visit (INDEPENDENT_AMBULATORY_CARE_PROVIDER_SITE_OTHER)
Admission: RE | Admit: 2018-03-21 | Discharge: 2018-03-21 | Disposition: A | Discharge status: Home / Self Care | Payer: Medicare Other | Source: Ambulatory Visit | Attending: Adult Health | Admitting: Adult Health

## 2018-03-21 ENCOUNTER — Encounter: Payer: Self-pay | Admitting: Adult Health

## 2018-03-21 VITALS — BP 146/76 | HR 66

## 2018-03-21 DIAGNOSIS — J441 Chronic obstructive pulmonary disease with (acute) exacerbation: Secondary | ICD-10-CM

## 2018-03-21 DIAGNOSIS — J439 Emphysema, unspecified: Secondary | ICD-10-CM

## 2018-03-21 DIAGNOSIS — J9611 Chronic respiratory failure with hypoxia: Secondary | ICD-10-CM | POA: Diagnosis not present

## 2018-03-21 DIAGNOSIS — R0602 Shortness of breath: Secondary | ICD-10-CM | POA: Diagnosis not present

## 2018-03-21 MED ORDER — TIOTROPIUM BROMIDE MONOHYDRATE 2.5 MCG/ACT IN AERS: 2.0000 | INHALATION_SPRAY | Freq: Every day | RESPIRATORY_TRACT | 0 refills | Status: DC

## 2018-03-21 NOTE — Assessment & Plan Note (Signed)
Cont on O2 to keep sats >88-90%   Plan  Patient Instructions  Continue on Symbicort 2 puffs Twice daily  , rinse after use.  Continue on Spiriva , may change Respimat .  Continue to remain active  Chest xray today .  Cont with oxygen 3-4 with activity and 5l/m with heavy activity .  Follow up with Dr. [place] Bells in 6 months and As needed

## 2018-03-21 NOTE — Assessment & Plan Note (Addendum)
Recent exacerbation now finished antibiotic and prednisone  Patient says he is feeling better. Chest x-ray is pending.  Plan  Patient Instructions  Continue on Symbicort 2 puffs Twice daily  , rinse after use.  Continue on Spiriva , may change Respimat .  Continue to remain active  Chest xray today .  Cont with oxygen 3-4 with activity and 5l/m with heavy activity .  Follow up with Dr. [place] Bells in 6 months and As needed

## 2018-03-21 NOTE — Progress Notes (Signed)
@Patient  ID: Harold Hall, male    DOB: 10/16/1946, 72 y.o.   MRN: [id number]  Chief Complaint  Patient presents with  . Follow-up    COPD    Referring provider: Alroy Dust, L.[name], MD  HPI: 72 year old male former smoker followed for severe COPD and Oxygen dependent respiratory failure  TEST :  Imaging: December 2017 chest CT images personal reviewed showing significant emphysema with an upper lobe predominance, no clear interstitial lung disease.  Blood: 09/2016 IgE 4   PFT: [facility] 2007:  FEV1 2.10 (46%), ratio 47 PFT 2017 Ratio 47%, FEV1 1.76L (46% pred), FVC 3.78L, TLC 6.45L (82% pred), DLCO 13.18  03/21/2018 Follow up: COPD , O2 RF  Patient presents for a 67-month follow-up.  Patient has underlying severe COPD.  Patient says he recently had a COPD exacerbation while on vacation at [facility]  Patient says he was seen in the emergency room and given a Z-Pak and prednisone.  Patient is feeling better with decreased cough and congestion.  Now coughing up clear mucus.  Shortness of breath and wheezing are decreased.  Patient denies any chest pain orthopnea PND or increased leg swelling.  Patient does have some diastolic dysfunction.  Has recently been on increased dose of Lasix says is been helping his leg swelling.  He remains on oxygen 3 to 4 L with activity and 5 L with heavy activity.  He gets short of breath with activity walking especially.   No Known Allergies  Immunization History  Administered Date(s) Administered  . Influenza Split 08/13/2011, 08/21/2012, 08/17/2014, 08/24/2015  . Influenza Whole 08/12/2009, 08/09/2010  . Influenza, High Dose Seasonal PF 08/09/2013, 08/13/2016, 09/05/2017  . Pneumococcal Conjugate-13 08/17/2014  . Pneumococcal Polysaccharide-23 08/10/2007    Past Medical History:  Diagnosis Date  . Allergic rhinitis   . Arthritis   . Cancer [facility])    left renal -solitary kidney  . COPD (chronic obstructive pulmonary disease) ([place])    . Erectile dysfunction   . Gout   . Hernia   . History of kidney cancer   . Hypercholesterolemia   . Hypertension   . RLS (restless legs syndrome)   . Sinus problem     Tobacco History: Social History   Tobacco Use  Smoking Status Former Smoker  . Packs/day: 1.50  . Years: 50.00  . Pack years: 75.00  . Types: Cigarettes  . Last attempt to quit: 06/04/2011  . Years since quitting: 6.8  Smokeless Tobacco Never Used   Counseling given: Not Answered   Outpatient Encounter Medications as of 03/21/2018  Medication Sig  . albuterol (PROAIR HFA) 108 (90 Base) MCG/ACT inhaler INHALE 2 PUFFS BY MOUTH INTO THE LUNGS EVERY 4 HOURS AS NEEDED FOR WHEEZING OR SHORTNESS OF BREATH  . allopurinol (ZYLOPRIM) 300 MG tablet Take 300 mg by mouth daily.  Marland Kitchen ALPRAZolam (XANAX) 0.5 MG tablet Take 1 tablet (0.5 mg total) by mouth 2 (two) times daily. (Patient taking differently: Take 0.5 mg by mouth at bedtime. )  . amLODipine (NORVASC) 10 MG tablet Take 10 mg by mouth daily.   Marland Kitchen atorvastatin (LIPITOR) 20 MG tablet Take 20 mg by mouth daily.  Marland Kitchen azelastine (ASTELIN) 0.1 % nasal spray Place 1 spray into both nostrils 2 (two) times daily. Once daily  . budesonide-formoterol (SYMBICORT) 160-4.5 MCG/ACT inhaler INHALE 2 PUFFS BY MOUTH INTO THE LUNGS TWICE A DAY  . ELIQUIS 5 MG TABS tablet Take 5 mg by mouth 2 (two) times daily.  . fish oil-omega-3  fatty acids 1000 MG capsule Take 1 capsule by mouth 3 (three) times daily.    . flecainide (TAMBOCOR) 100 MG tablet Take 100 mg by mouth 2 (two) times daily.  . furosemide (LASIX) 40 MG tablet Take 40 mg by mouth daily.   . Linaclotide (LINZESS) 145 MCG CAPS capsule Take 145 mcg by mouth daily.  . metoprolol succinate (TOPROL-XL) 50 MG 24 hr tablet Take 50 mg by mouth daily. Take with or immediately following a meal.  . Multiple Vitamin (MULITIVITAMIN WITH MINERALS) TABS Take 1 tablet by mouth daily.  . nitroGLYCERIN (NITROSTAT) 0.4 MG SL tablet Place 0.4 mg  under the tongue every 5 (five) minutes as needed for chest pain.  . potassium chloride (KLOR-CON) 20 MEQ packet Take 20 mEq by mouth daily.  . roflumilast (DALIRESP) 500 MCG TABS tablet Take 1 tablet (500 mcg total) by mouth daily.  Marland Kitchen rOPINIRole (REQUIP) 0.5 MG tablet Take 1 tablet (0.5 mg total) by mouth daily.  . temazepam (RESTORIL) 15 MG capsule Take 15 mg by mouth at bedtime as needed for sleep.   Marland Kitchen tiotropium (SPIRIVA HANDIHALER) 18 MCG inhalation capsule INHALE THE CONTENTS OF 1 CAPSULE DAILY  . traMADol (ULTRAM) 50 MG tablet Take 50 mg by mouth 3 (three) times daily as needed for moderate pain.   . valsartan-hydrochlorothiazide (DIOVAN-HCT) 320-25 MG per tablet Take 1 tablet by mouth daily.  . Tiotropium Bromide Monohydrate (SPIRIVA RESPIMAT) 2.5 MCG/ACT AERS Inhale 2 puffs into the lungs daily.   No facility-administered encounter medications on file as of 03/21/2018.      Review of Systems  Constitutional:   No  weight loss, night sweats,  Fevers, chills,  +fatigue, or  lassitude.  HEENT:   No headaches,  Difficulty swallowing,  Tooth/dental problems, or  Sore throat,                No sneezing, itching, ear ache, nasal congestion, post nasal drip,   CV:  No chest pain,  Orthopnea, PND, swelling in lower extremities, anasarca, dizziness, palpitations, syncope.   GI  No heartburn, indigestion, abdominal pain, nausea, vomiting, diarrhea, change in bowel habits, loss of appetite, bloody stools.   Resp:   No chest wall deformity  Skin: no rash or lesions.  GU: no dysuria, change in color of urine, no urgency or frequency.  No flank pain, no hematuria   MS:  No joint pain or swelling.  No decreased range of motion.  No back pain.    Physical Exam  Pulse 66   SpO2 92%   GEN: A/Ox3; pleasant , NAD, obese on oxygen   HEENT:  [place]/AT,  EACs-clear, TMs-wnl, NOSE-clear, THROAT-clear, no lesions, no postnasal drip or exudate noted.   NECK:  Supple w/ fair ROM; no JVD; normal  carotid impulses w/o bruits; no thyromegaly or nodules palpated; no lymphadenopathy.    RESP decreased breath sounds in the bases  no accessory muscle use, no dullness to percussion  CARD:  RRR, no m/r/g, 1+  peripheral edema, pulses intact, no cyanosis or clubbing.  GI:   Soft & nt; nml bowel sounds; no organomegaly or masses detected.   Musco: Warm bil, no deformities or joint swelling noted.   Neuro: alert, no focal deficits noted.    Skin: Warm, no lesions or rashes    Lab Results:    BNP No results found for: BNP  ProBNP  Imaging: Dg Chest 2 View  Result Date: 03/21/2018 CLINICAL DATA:  Pulmonary emphysema short of  breath EXAM: CHEST - 2 VIEW COMPARISON:  08/05/2016 FINDINGS: COPD. Pulmonary hyperinflation and pulmonary scarring bilaterally. Heart size upper normal. Negative for heart failure or pneumonia. No pleural effusion. Extensive apical scarring bilaterally unchanged. IMPRESSION: Advanced COPD without acute abnormality.  No interval change. Electronically Signed   By: Franchot Gallo M.D.   On: 03/21/2018 13:15     Assessment & Plan:   COPD exacerbation Recent exacerbation now finished antibiotic and prednisone  Patient says he is feeling better. Chest x-ray is pending.  Plan  Patient Instructions  Continue on Symbicort 2 puffs Twice daily  , rinse after use.  Continue on Spiriva , may change Respimat .  Continue to remain active  Chest xray today .  Cont with oxygen 3-4 with activity and 5l/m with heavy activity .  Follow up with Dr. Lake Bells in 6 months and As needed         Chronic respiratory failure with hypoxia ([place]) Cont on O2 to keep sats >88-90%   Plan  Patient Instructions  Continue on Symbicort 2 puffs Twice daily  , rinse after use.  Continue on Spiriva , may change Respimat .  Continue to remain active  Chest xray today .  Cont with oxygen 3-4 with activity and 5l/m with heavy activity .  Follow up with Dr. Lake Bells in 6 months and  As needed            Rexene Edison, NP 03/21/2018

## 2018-03-21 NOTE — Patient Instructions (Addendum)
Continue on Symbicort 2 puffs Twice daily  , rinse after use.  Continue on Spiriva , may change Respimat .  Continue to remain active  Chest xray today .  Cont with oxygen 3-4 with activity and 5l/m with heavy activity .  Follow up with Dr. Lake Bells in 6 months and As needed

## 2018-03-22 NOTE — Progress Notes (Signed)
Reviewed, agree 

## 2018-03-30 IMAGING — DX DG CHEST 2V
2 series · 2 of 2 positions shown · non-contrast
Comparison: 04/11/2014.

CLINICAL DATA: Atrial fibrillation. COPD . Chronic smoking history.

EXAM:
CHEST  2 VIEW

[chest pa]
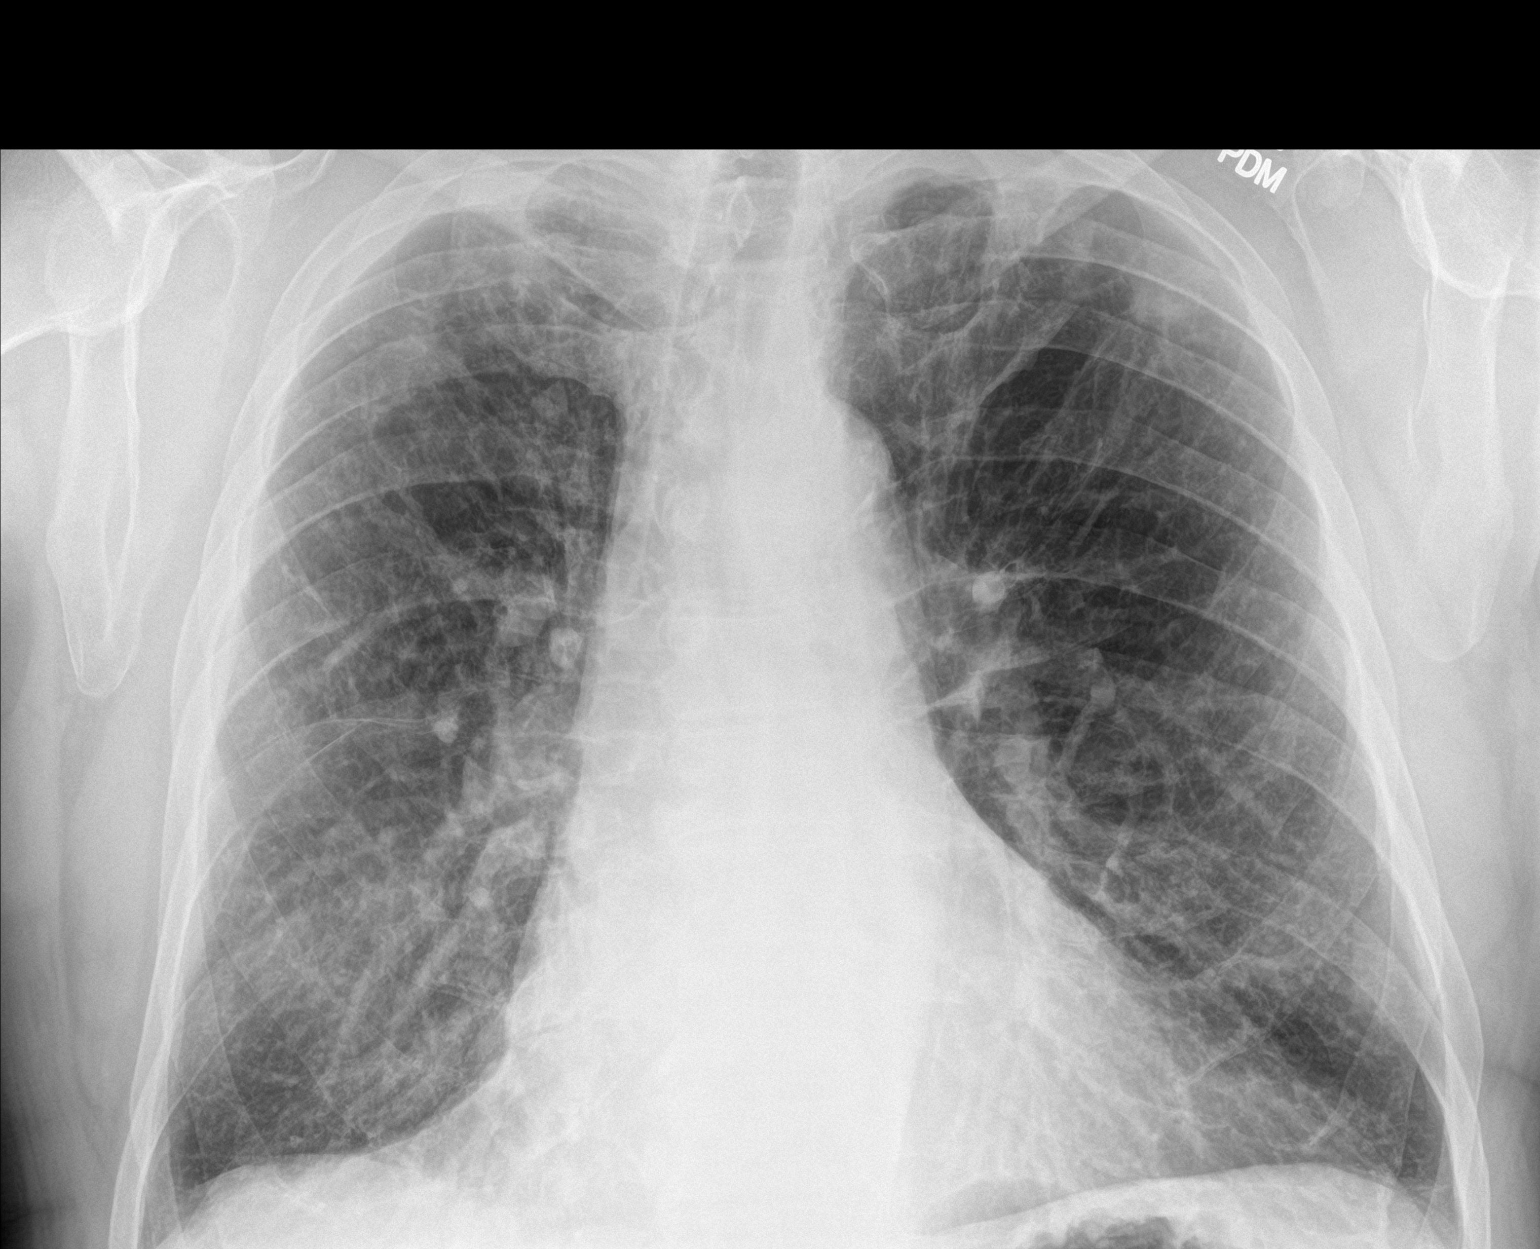

[chest lat]
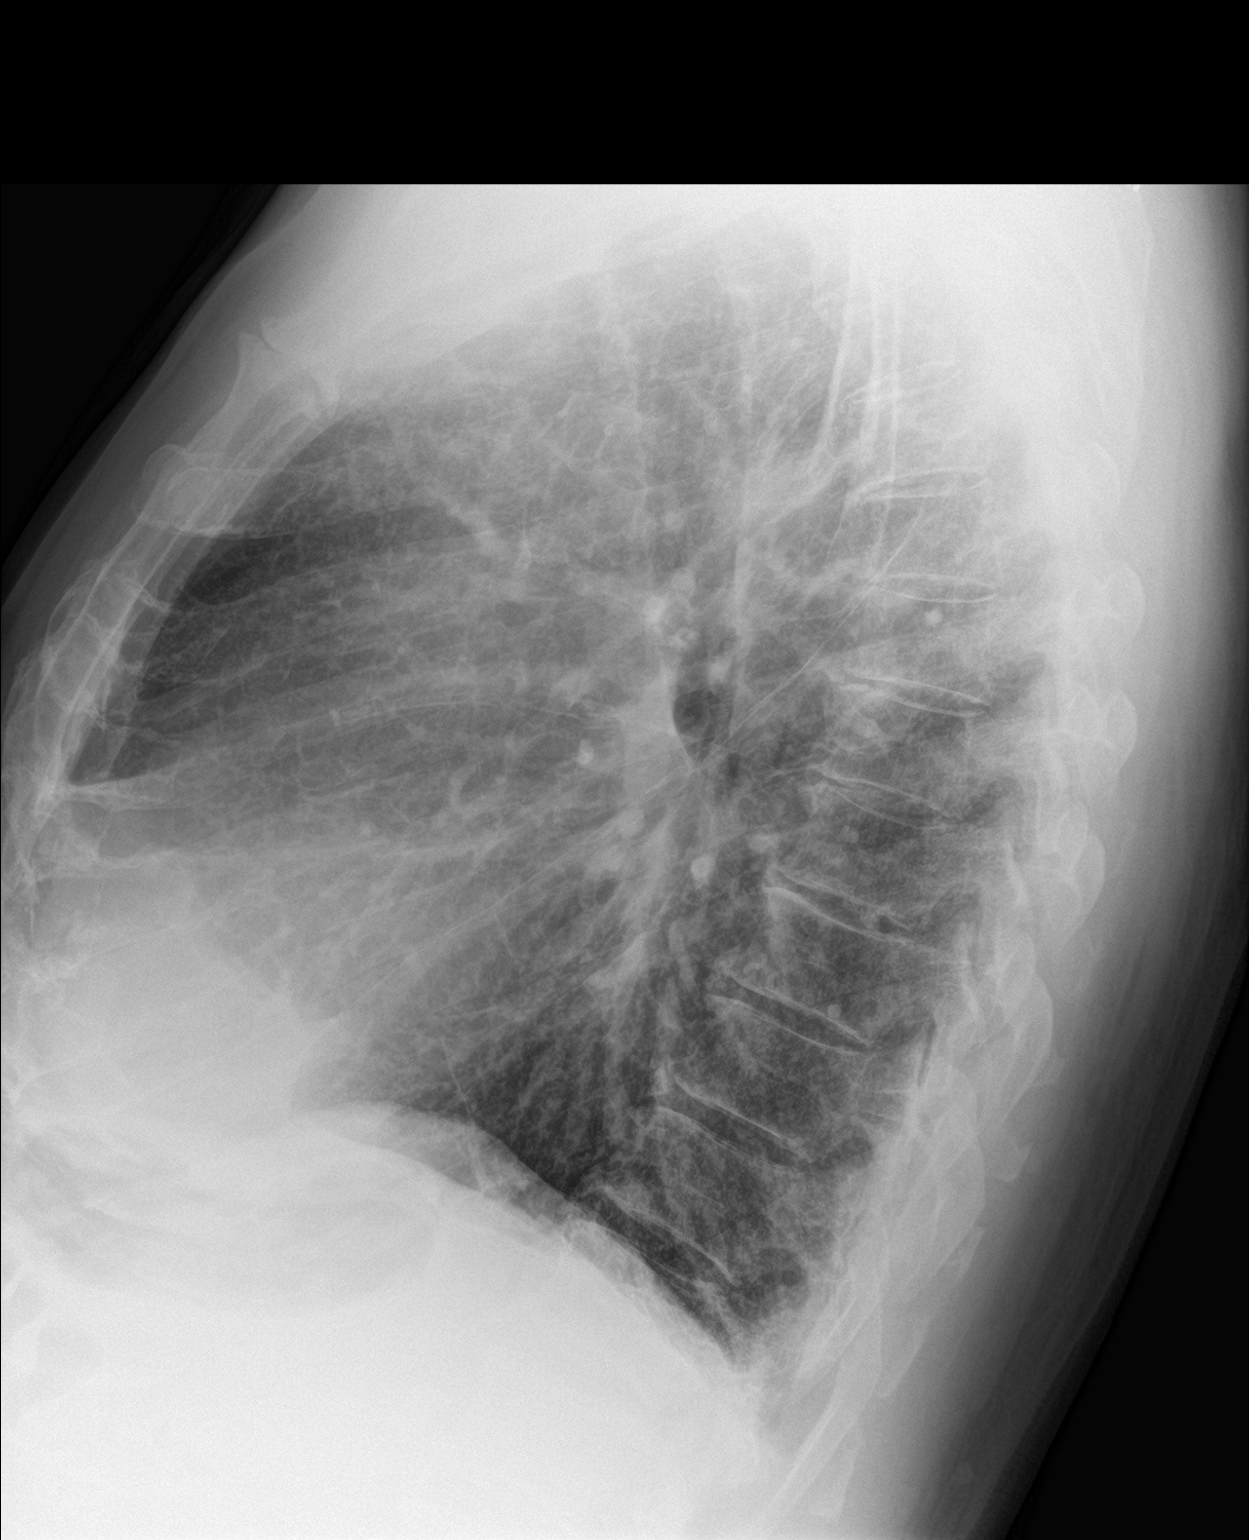

[2 of 2 positions shown; findings below may reference images not displayed]

FINDINGS: Stable cardiomegaly. No pulmonary venous congestion. Stable
bilateral from interstitial prominence consistent chronic
interstitial lung disease. No acute pulmonary infiltrate.
Pleural-parenchymal thickening noted consistent with scarring. No
acute bony abnormality.
IMPRESSION: 1. Stable cardiomegaly.  No pulmonary venous congestion.

2. Stable chronic interstitial changes consistent with interstitial
fibrosis.

## 2018-04-14 ENCOUNTER — Encounter (HOSPITAL_COMMUNITY): Payer: Self-pay

## 2018-04-14 ENCOUNTER — Encounter: Payer: Self-pay | Admitting: Pulmonary Disease

## 2018-04-14 ENCOUNTER — Ambulatory Visit (INDEPENDENT_AMBULATORY_CARE_PROVIDER_SITE_OTHER): Payer: Medicare Other | Admitting: Pulmonary Disease

## 2018-04-14 VITALS — BP 142/70 | HR 68 | Ht 74.0 in | Wt 298.0 lb

## 2018-04-14 DIAGNOSIS — J439 Emphysema, unspecified: Secondary | ICD-10-CM | POA: Insufficient documentation

## 2018-04-14 DIAGNOSIS — J441 Chronic obstructive pulmonary disease with (acute) exacerbation: Secondary | ICD-10-CM

## 2018-04-14 DIAGNOSIS — J9611 Chronic respiratory failure with hypoxia: Secondary | ICD-10-CM | POA: Insufficient documentation

## 2018-04-14 DIAGNOSIS — J301 Allergic rhinitis due to pollen: Secondary | ICD-10-CM | POA: Diagnosis not present

## 2018-04-14 DIAGNOSIS — I4891 Unspecified atrial fibrillation: Secondary | ICD-10-CM

## 2018-04-14 DIAGNOSIS — I10 Essential (primary) hypertension: Secondary | ICD-10-CM | POA: Insufficient documentation

## 2018-04-14 MED ORDER — MONTELUKAST SODIUM 10 MG PO TABS: 10.0000 mg | ORAL_TABLET | Freq: Every day | ORAL | 2 refills | Status: DC

## 2018-04-14 MED ORDER — PREDNISONE 20 MG PO TABS: 20.0000 mg | ORAL_TABLET | Freq: Every day | ORAL | 0 refills | Status: AC

## 2018-04-14 NOTE — Progress Notes (Signed)
Subjective:    Patient ID: Harold Hall, male    DOB: 11-10-1945, 72 y.o.   MRN: [id number]  Synopsis: Former patient of Dr. Gwenette Greet with COPD. Dr. Gwenette Greet summarized his situation as follows: Stopped smoking 05/2011 Pulm rehab 2012, 2016, ongoing participation 2018 On exertional oxygen with portable concentrator> 3L rest, 5L with exertion +response to daliresp in past  HPI Chief Complaint  Patient presents with  . Follow-up    ROV, shortness of breath, weakness, sinus congestion, produtive cough (yellow/clear)   Harold Hall says that he is weak and his breathing is "very bad".  By the end of April he started getting head congestion, drainage, throat "tickle" and ended up struggling to breathe.  He was at the beach in [place] and he was given prednisone and antibiotics.  He saw [place] in the office and we didn't make any changes on 5/13.  Since then he still has a lot of sinus congestion, epistaxis.  He feels weak and has increased his oxygen rate.  He is still coughing up mucus from his chest.  He has been using a Milta Deiters Med rinses and is taking mucinex twice a day.  He takes Astelin.  He doesn't use a nasal steroid, he says that Flonase didn't help him in the past.  He has itchy eyes and a scratchy throat.  No recent fever or chills.  No sick contacts.   His leg swelling "comes and goes".  Today there is more swelling in his legs.   Past Medical History:  Diagnosis Date  . Allergic rhinitis   . Arthritis   . Cancer [facility])    left renal -solitary kidney  . COPD (chronic obstructive pulmonary disease) ([place])   . Erectile dysfunction   . Gout   . Hernia   . History of kidney cancer   . Hypercholesterolemia   . Hypertension   . RLS (restless legs syndrome)   . Sinus problem       Review of Systems  Constitutional: Negative for chills, fatigue and fever.  HENT: Negative for postnasal drip, rhinorrhea and sinus pressure.   Respiratory: Positive for shortness of breath. Negative  for cough and wheezing.   Cardiovascular: Positive for leg swelling. Negative for chest pain and palpitations.       Objective:   Physical Exam Vitals:   04/14/18 1126  BP: (!) 142/70  Pulse: 68  SpO2: 91%  Weight: 298 lb (135.2 kg)  Height: 6\' 2"  (1.88 m)    Gen: overweight but well appearing HENT: OP clear, TM's clear, neck supple PULM: Wheezing upper lobes B, crackles bases, normal percussion CV: Irreg irreg, no mgr, trace edema GI: BS+, soft, nontender Derm: no cyanosis or rash Psyche: normal mood and affect    Imaging: December 2017 chest CT images personal reviewed showing significant emphysema with an upper lobe predominance, no clear interstitial lung disease.  Blood: 09/2016 IgE 4   PFT: [facility] 2007:  FEV1 2.10 (46%), ratio 47 PFT 2017 Ratio 47%, FEV1 1.76L (46% pred), FVC 3.78L, TLC 6.45L (82% pred), DLCO 13.18  CBC    Component Value Date/Time   WBC 8.4 10/06/2016 1306   RBC 5.74 10/06/2016 1306   HGB 16.8 10/06/2016 1306   HCT 49.6 10/06/2016 1306   PLT 190.0 10/06/2016 1306   MCV 86.3 10/06/2016 1306   MCH 28.8 06/10/2011 0904   MCHC 33.8 10/06/2016 1306   RDW 15.6 (H) 10/06/2016 1306   LYMPHSABS 1.1 06/10/2011 0904   MONOABS 1.0  06/10/2011 0904   EOSABS 0.3 06/10/2011 0904   BASOSABS 0.0 06/10/2011 8921   Recent visit with our nurse practitioner reviewed from Mar 21, 2018 where he had a chest x-ray because of a recent exacerbation and was advised to continue taking Symbicort and Spiriva.     Assessment & Plan:   Chronic respiratory failure with hypoxia (HCC)  COPD exacerbation (HCC)  Atrial fibrillation, unspecified type ([place])  Allergic rhinitis due to pollen, unspecified seasonality  Discussion: Shemuel is struggled recently after his most recent COPD exacerbation.  This is the first exacerbation he has had a quite some time and from a symptom standpoint things have been slow to improve.  I think that his allergic rhinitis is actually  contributing significantly to worsening disease.  I do not think it is very well controlled right now.  Because this exacerbation has giving him so much trouble I am inclined to add daily azithromycin but a bit hesitant because of his history of atrial fibrillation.  I would like to give him a round of prednisone and enhance his allergic rhinitis treatment and then have him come back for close follow-up.  If he still not feeling better at that point then adding daily azithromycin would be appropriate.  Plan: Recent COPD exacerbation, now with persistent symptoms: Take prednisone 20 mg daily x5 days Continue taking Spiriva Continue taking Symbicort Albuterol as needed If no improvement by the next visit then we will start daily azithromycin 250 mg daily  Allergic rhinitis: Stop Zyrtec Start Claritin over-the-counter daily Start montelukast 10 mg daily Start taking over-the-counter Nasonex 2 sprays each nostril daily, this can take up to 3 weeks to become effective.  The generic form is fine  Chronic respiratory failure with hypoxemia: Continue using oxygen as you are doing 3-4 at rest, up to 5 with exertion  Follow up in 2-3 weeks or sooner if needed    Current Outpatient Medications:  .  albuterol (PROAIR HFA) 108 (90 Base) MCG/ACT inhaler, INHALE 2 PUFFS BY MOUTH INTO THE LUNGS EVERY 4 HOURS AS NEEDED FOR WHEEZING OR SHORTNESS OF BREATH, Disp: 3 Inhaler, Rfl: 3 .  allopurinol (ZYLOPRIM) 300 MG tablet, Take 300 mg by mouth daily., Disp: , Rfl:  .  ALPRAZolam (XANAX) 0.5 MG tablet, Take 1 tablet (0.5 mg total) by mouth 2 (two) times daily. (Patient taking differently: Take 0.5 mg by mouth at bedtime. ), Disp: 60 tablet, Rfl: 0 .  amLODipine (NORVASC) 10 MG tablet, Take 10 mg by mouth daily. , Disp: , Rfl:  .  atorvastatin (LIPITOR) 20 MG tablet, Take 20 mg by mouth daily., Disp: , Rfl:  .  azelastine (ASTELIN) 0.1 % nasal spray, Place 1 spray into both nostrils 2 (two) times daily. Once  daily, Disp: , Rfl: 2 .  budesonide-formoterol (SYMBICORT) 160-4.5 MCG/ACT inhaler, INHALE 2 PUFFS BY MOUTH INTO THE LUNGS TWICE A DAY, Disp: 3 Inhaler, Rfl: 3 .  ELIQUIS 5 MG TABS tablet, Take 5 mg by mouth 2 (two) times daily., Disp: , Rfl: 0 .  fish oil-omega-3 fatty acids 1000 MG capsule, Take 1 capsule by mouth 3 (three) times daily.  , Disp: , Rfl:  .  flecainide (TAMBOCOR) 100 MG tablet, Take 100 mg by mouth 2 (two) times daily., Disp: , Rfl: 0 .  furosemide (LASIX) 40 MG tablet, Take 40 mg by mouth daily. , Disp: , Rfl:  .  Linaclotide (LINZESS) 145 MCG CAPS capsule, Take 145 mcg by mouth daily., Disp: , Rfl:  .  metoprolol succinate (TOPROL-XL) 50 MG 24 hr tablet, Take 50 mg by mouth daily. Take with or immediately following a meal., Disp: , Rfl:  .  Multiple Vitamin (MULITIVITAMIN WITH MINERALS) TABS, Take 1 tablet by mouth daily., Disp: , Rfl:  .  nitroGLYCERIN (NITROSTAT) 0.4 MG SL tablet, Place 0.4 mg under the tongue every 5 (five) minutes as needed for chest pain., Disp: , Rfl:  .  potassium chloride (KLOR-CON) 20 MEQ packet, Take 20 mEq by mouth daily., Disp: 3 tablet, Rfl: 0 .  roflumilast (DALIRESP) 500 MCG TABS tablet, Take 1 tablet (500 mcg total) by mouth daily., Disp: 90 tablet, Rfl: 3 .  rOPINIRole (REQUIP) 0.5 MG tablet, Take 1 tablet (0.5 mg total) by mouth daily., Disp: 30 tablet, Rfl: 0 .  temazepam (RESTORIL) 15 MG capsule, Take 15 mg by mouth at bedtime as needed for sleep. , Disp: , Rfl:  .  tiotropium (SPIRIVA HANDIHALER) 18 MCG inhalation capsule, INHALE THE CONTENTS OF 1 CAPSULE DAILY, Disp: 90 capsule, Rfl: 3 .  Tiotropium Bromide Monohydrate (SPIRIVA RESPIMAT) 2.5 MCG/ACT AERS, Inhale 2 puffs into the lungs daily., Disp: 2 Inhaler, Rfl: 0 .  traMADol (ULTRAM) 50 MG tablet, Take 50 mg by mouth 3 (three) times daily as needed for moderate pain. , Disp: , Rfl: 0 .  valsartan-hydrochlorothiazide (DIOVAN-HCT) 320-25 MG per tablet, Take 1 tablet by mouth daily., Disp: ,  Rfl:

## 2018-04-14 NOTE — Patient Instructions (Signed)
Recent COPD exacerbation, now with persistent symptoms: Take prednisone 20 mg daily x5 days Continue taking Spiriva Continue taking Symbicort Albuterol as needed If no improvement by the next visit then we will start daily azithromycin 250 mg daily  Allergic rhinitis: Stop Zyrtec Start Claritin over-the-counter daily Start montelukast 10 mg daily Start taking over-the-counter Nasonex 2 sprays each nostril daily, this can take up to 3 weeks to become effective.  The generic form is fine  Chronic respiratory failure with hypoxemia: Continue using oxygen as you are doing 3-4 at rest, up to 5 with exertion  Follow up in 2-3 weeks or sooner if needed

## 2018-04-14 NOTE — Addendum Note (Signed)
Addended by: Dolores Lory on: 04/14/2018 12:04 PM   Modules accepted: Orders

## 2018-04-14 NOTE — Addendum Note (Signed)
Addended by: Dolores Lory on: 04/14/2018 12:11 PM   Modules accepted: Orders

## 2018-04-19 ENCOUNTER — Encounter (HOSPITAL_COMMUNITY): Payer: Self-pay

## 2018-04-19 ENCOUNTER — Ambulatory Visit (HOSPITAL_COMMUNITY): Payer: Medicare Other

## 2018-04-21 ENCOUNTER — Encounter (HOSPITAL_COMMUNITY): Admission: RE | Admit: 2018-04-21 | Payer: Self-pay | Source: Ambulatory Visit

## 2018-04-26 ENCOUNTER — Ambulatory Visit (INDEPENDENT_AMBULATORY_CARE_PROVIDER_SITE_OTHER)
Admission: RE | Admit: 2018-04-26 | Discharge: 2018-04-26 | Disposition: A | Discharge status: Home / Self Care | Payer: Medicare Other | Source: Ambulatory Visit | Attending: Pulmonary Disease | Admitting: Pulmonary Disease

## 2018-04-26 ENCOUNTER — Ambulatory Visit (INDEPENDENT_AMBULATORY_CARE_PROVIDER_SITE_OTHER): Payer: Medicare Other | Admitting: Pulmonary Disease

## 2018-04-26 ENCOUNTER — Other Ambulatory Visit: Payer: Self-pay | Admitting: Pulmonary Disease

## 2018-04-26 ENCOUNTER — Encounter (HOSPITAL_COMMUNITY): Payer: Self-pay

## 2018-04-26 ENCOUNTER — Encounter: Payer: Self-pay | Admitting: Pulmonary Disease

## 2018-04-26 VITALS — BP 122/72 | HR 71 | Ht 75.0 in | Wt 299.0 lb

## 2018-04-26 DIAGNOSIS — J441 Chronic obstructive pulmonary disease with (acute) exacerbation: Secondary | ICD-10-CM | POA: Diagnosis not present

## 2018-04-26 DIAGNOSIS — R609 Edema, unspecified: Secondary | ICD-10-CM | POA: Diagnosis not present

## 2018-04-26 DIAGNOSIS — J9611 Chronic respiratory failure with hypoxia: Secondary | ICD-10-CM

## 2018-04-26 DIAGNOSIS — R0602 Shortness of breath: Secondary | ICD-10-CM | POA: Diagnosis not present

## 2018-04-26 MED ORDER — DOXYCYCLINE HYCLATE 100 MG PO TABS: 100.0000 mg | ORAL_TABLET | Freq: Two times a day (BID) | ORAL | 0 refills | Status: DC

## 2018-04-26 MED ORDER — PREDNISONE 10 MG PO TABS: ORAL_TABLET | ORAL | 0 refills | Status: DC

## 2018-04-26 NOTE — Progress Notes (Signed)
@Patient  ID: Harold Hall, male    DOB: [birth year], 72 y.o.   MRN: [id number]  Chief Complaint  Patient presents with  . Acute Visit    increased SOB, chest tightness, and abdomen feels tight and bloated x 3 days. States oxygen has been in 70's for last couple of days. O2 @ 77% on 5L pulsed. 90% on 6L continous.     Referring provider: Alroy Dust, L.[name], MD  HPI: Synopsis: Former patient of Dr. Gwenette Greet with COPD. Dr. Gwenette Greet summarized his situation as follows: Stopped smoking 05/2011 Pulm rehab 2012, 2016, ongoing participation 2018 On exertional oxygen with portable concentrator> 3L rest, 5L with exertion +response to daliresp in past  Recent [facility] Encounters:   04/14/18- Acute- Donterius Filley says that he is weak and his breathing is "very bad".  By the end of April he started getting head congestion, drainage, throat "tickle" and ended up struggling to breathe.  He was at the beach in [place] and he was given prednisone and antibiotics.  He saw [place] in the office and we didn't make any changes on 5/13.  Since then he still has a lot of sinus congestion, epistaxis.  He feels weak and has increased his oxygen rate.  He is still coughing up mucus from his chest.  He has been using a Milta Deiters Med rinses and is taking mucinex twice a day.  He takes Astelin.  He doesn't use a nasal steroid, he says that Flonase didn't help him in the past.  He has itchy eyes and a scratchy throat.  No recent fever or chills.  No sick contacts.   His leg swelling "comes and goes".  Today there is more swelling in his legs. Plan: Recent COPD exacerbation, now with persistent symptoms: Take prednisone 20 mg daily x5 days Continue taking Spiriva Continue taking Symbicort Albuterol as needed If no improvement by the next visit then we will start daily azithromycin 250 mg daily  Allergic rhinitis: Stop Zyrtec Start Claritin over-the-counter daily Start montelukast 10 mg daily Start taking  over-the-counter Nasonex 2 sprays each nostril daily, this can take up to 3 weeks to become effective.  The generic form is fine  Chronic respiratory failure with hypoxemia: Continue using oxygen as you are doing 3-4 at rest, up to 5 with exertion  Follow up in 2-3 weeks or sooner if needed     Tests:   PFT: 04/22/2016-pulmonary function test severe obstruction, Decreased diffusion capacity, no bronchodilator response Spiro 2007:  FEV1 2.10 (46%), ratio 47 PFT 2017 Ratio 47%, FEV1 1.76L (46% pred), FVC 3.78L, TLC 6.45L (82% pred), DLCO 13.18   Imaging:  03/21/2018-chest x-ray-advanced COPD without acute changes 10/09/2016-CT chest high-res- no evidence to suggest interstitial lung disease, diffuse bronchial wall thickening with moderate to severe central lobar mild paraseptal emphysema, with mild air trapping, dilatation of pulmonic trunk suggested a pulmonary artery hypertension    Cardiac:   Labs:  09/2016 IgE 4   Micro:   Chart Review:     04/26/18 Acute Pleasant 71 year old patient seen in office today.  Patient reporting 2 days of symptoms where oxygen saturations have been running in the mid to low 70s.  Patient reporting that at rest he is able to get them and come up to mid 68s.  Patient reporting shortness of breath as well as increased chest tightness.  Patient reports adherence to Symbicort, Spiriva, allergy medications.  Of note patient reported to our office on 5 L pulsed air with POC.  Oxygen saturation was 77% on arrival.  Patient placed on 6 L continuous with office tank and oxygen saturations were 90%.  Patient was previously establish with [facility].  They have their own concentrator at home that they purchased out-of-pocket.  Also using POC.  Patient also presents with chronic lower extremity swelling.  Patient reports that today legs look much better than how they did last week.  Patient has not been completing pulmonary rehab due to recent  symptoms.    No Known Allergies  Immunization History  Administered Date(s) Administered  . Influenza Split 08/13/2011, 08/21/2012, 08/17/2014, 08/24/2015  . Influenza Whole 08/12/2009, 08/09/2010  . Influenza, High Dose Seasonal PF 08/09/2013, 08/13/2016, 09/05/2017  . Pneumococcal Conjugate-13 08/17/2014  . Pneumococcal Polysaccharide-23 08/10/2007    Past Medical History:  Diagnosis Date  . Allergic rhinitis   . Arthritis   . Cancer [facility])    left renal -solitary kidney  . COPD (chronic obstructive pulmonary disease) ([place])   . Erectile dysfunction   . Gout   . Hernia   . History of kidney cancer   . Hypercholesterolemia   . Hypertension   . RLS (restless legs syndrome)   . Sinus problem     Tobacco History: Social History   Tobacco Use  Smoking Status Former Smoker  . Packs/day: 1.50  . Years: 50.00  . Pack years: 75.00  . Types: Cigarettes  . Last attempt to quit: 06/04/2011  . Years since quitting: 6.8  Smokeless Tobacco Never Used   Counseling given: Not Answered Continue not smoking.  Outpatient Encounter Medications as of 04/26/2018  Medication Sig  . albuterol (PROAIR HFA) 108 (90 Base) MCG/ACT inhaler INHALE 2 PUFFS BY MOUTH INTO THE LUNGS EVERY 4 HOURS AS NEEDED FOR WHEEZING OR SHORTNESS OF BREATH  . allopurinol (ZYLOPRIM) 300 MG tablet Take 300 mg by mouth daily.  Marland Kitchen ALPRAZolam (XANAX) 0.5 MG tablet Take 1 tablet (0.5 mg total) by mouth 2 (two) times daily. (Patient taking differently: Take 0.5 mg by mouth at bedtime. )  . amLODipine (NORVASC) 10 MG tablet Take 10 mg by mouth daily.   Marland Kitchen atorvastatin (LIPITOR) 20 MG tablet Take 20 mg by mouth daily.  Marland Kitchen azelastine (ASTELIN) 0.1 % nasal spray Place 1 spray into both nostrils 2 (two) times daily. Once daily  . budesonide-formoterol (SYMBICORT) 160-4.5 MCG/ACT inhaler INHALE 2 PUFFS BY MOUTH INTO THE LUNGS TWICE A DAY  . ELIQUIS 5 MG TABS tablet Take 5 mg by mouth 2 (two) times daily.  . fish oil-omega-3  fatty acids 1000 MG capsule Take 1 capsule by mouth 3 (three) times daily.    . flecainide (TAMBOCOR) 100 MG tablet Take 100 mg by mouth 2 (two) times daily.  . furosemide (LASIX) 40 MG tablet Take 40 mg by mouth daily.   . Linaclotide (LINZESS) 145 MCG CAPS capsule Take 145 mcg by mouth daily.  . metoprolol succinate (TOPROL-XL) 50 MG 24 hr tablet Take 50 mg by mouth daily. Take with or immediately following a meal.  . montelukast (SINGULAIR) 10 MG tablet Take 1 tablet (10 mg total) by mouth at bedtime.  . Multiple Vitamin (MULITIVITAMIN WITH MINERALS) TABS Take 1 tablet by mouth daily.  . nitroGLYCERIN (NITROSTAT) 0.4 MG SL tablet Place 0.4 mg under the tongue every 5 (five) minutes as needed for chest pain.  . potassium chloride (KLOR-CON) 20 MEQ packet Take 20 mEq by mouth daily.  . roflumilast (DALIRESP) 500 MCG TABS tablet Take 1 tablet (500 mcg  total) by mouth daily.  Marland Kitchen rOPINIRole (REQUIP) 0.5 MG tablet Take 1 tablet (0.5 mg total) by mouth daily.  . temazepam (RESTORIL) 15 MG capsule Take 15 mg by mouth at bedtime as needed for sleep.   Marland Kitchen tiotropium (SPIRIVA HANDIHALER) 18 MCG inhalation capsule INHALE THE CONTENTS OF 1 CAPSULE DAILY  . Tiotropium Bromide Monohydrate (SPIRIVA RESPIMAT) 2.5 MCG/ACT AERS Inhale 2 puffs into the lungs daily.  . traMADol (ULTRAM) 50 MG tablet Take 50 mg by mouth 3 (three) times daily as needed for moderate pain.   . valsartan-hydrochlorothiazide (DIOVAN-HCT) 320-25 MG per tablet Take 1 tablet by mouth daily.  Marland Kitchen doxycycline (VIBRA-TABS) 100 MG tablet Take 1 tablet (100 mg total) by mouth 2 (two) times daily.  . predniSONE (DELTASONE) 10 MG tablet 4 tabs for 2 days, then 3 tabs for 2 days, 2 tabs for 2 days, then 1 tab for 2 days, then stop   No facility-administered encounter medications on file as of 04/26/2018.      Review of Systems  Constitutional:  +fatigue  No  weight loss, night sweats,  fevers, chills HEENT: +nasal congestion   No headaches,   Difficulty swallowing,  Tooth/dental problems, or  Sore throat, No sneezing, itching, ear ache, post nasal drip  CV: +LE swelling No chest pain,  orthopnea, PND, anasarca, dizziness, palpitations, syncope  GI: No heartburn, indigestion, abdominal pain, nausea, vomiting, diarrhea, change in bowel habits, loss of appetite, bloody stools Resp: +sob with exertion and rest, cough with productive yellow / tan mucous  No coughing up of blood.   No wheezing.  No chest wall deformity Skin: no rash, lesions, no skin changes. GU: no dysuria, change in color of urine, no urgency or frequency.  No flank pain, no hematuria  MS:  No joint pain or swelling.  No decreased range of motion.  No back pain. Psych:  No change in mood or affect. No depression or anxiety.  No memory loss.   Physical Exam  BP 122/72   Pulse 71   Ht 6\' 3"  (1.905 m)   Wt 299 lb (135.6 kg)   SpO2 90%   BMI 37.37 kg/m   Wt Readings from Last 3 Encounters:  04/26/18 299 lb (135.6 kg)  04/14/18 298 lb (135.2 kg)  12/06/17 295 lb 9.6 oz (134.1 kg)    GEN: A/Ox3; pleasant , NAD, well nourished, chronically ill, +on 6L oxygen    HEENT:  [place]/AT,  EACs-clear, TMs-wnl, NOSE-clear, THROAT- +post nasal drip   NECK:  Supple w/ fair ROM; no JVD; normal carotid impulses w/o bruits; no lymphadenopathy.    RESP: +crackles in bases bilaterally, air movement through out exam, minimal exp wheeze in upper lobes,   no accessory muscle use, no dullness to percussion  CARD:  Irregular irregular , no m/r/g, no peripheral edema, pulses intact, no cyanosis or clubbing.  GI:   Soft & nt; nml bowel sounds; no organomegaly or masses detected.   Musco: Warm bil, no deformities or joint swelling noted, in wheelchair at beginning of exam, but then ambulating freely without issue   Neuro: alert, no focal deficits noted.    Skin: Warm, no lesions or rashes    Lab Results:  CBC    Component Value Date/Time   WBC 8.4 10/06/2016 1306   RBC 5.74  10/06/2016 1306   HGB 16.8 10/06/2016 1306   HCT 49.6 10/06/2016 1306   PLT 190.0 10/06/2016 1306   MCV 86.3 10/06/2016 1306   MCH 28.8  06/10/2011 0904   MCHC 33.8 10/06/2016 1306   RDW 15.6 (H) 10/06/2016 1306   LYMPHSABS 1.1 06/10/2011 0904   MONOABS 1.0 06/10/2011 0904   EOSABS 0.3 06/10/2011 0904   BASOSABS 0.0 06/10/2011 0904    BMET    Component Value Date/Time   NA 143 06/10/2011 0904   K 4.1 06/10/2011 0904   CL 101 06/10/2011 0904   CO2 31 06/10/2011 0904   GLUCOSE 118 (H) 06/10/2011 0904   BUN 22 06/10/2011 0904   CREATININE 1.34 06/10/2011 0904   CALCIUM 9.3 06/10/2011 0904   GFRNONAA 40 (L) 06/08/2011 1020   GFRAA 48 (L) 06/08/2011 1020    BNP No results found for: BNP  ProBNP    Component Value Date/Time   PROBNP 459.50 (H) 04/21/2016 1237    Imaging: Dg Chest 2 View  Result Date: 04/26/2018 CLINICAL DATA:  Increasing shortness of breath over the past few days EXAM: CHEST - 2 VIEW COMPARISON:  03/21/2018 FINDINGS: Cardiac shadow is stable. Diffuse scarring is again identified bilaterally and stable given some technical variations in the film. No focal acute infiltrate is seen. Minimal blunting of the left costophrenic angle is noted consistent with small effusion. No bony abnormality is noted. IMPRESSION: Small left pleural effusion. Persistent pulmonary scarring stable from previous exams. Electronically Signed   By: Inez Catalina M.D.   On: 04/26/2018 12:20     Assessment & Plan:   Pleasant 72 year old patient.  Patient was COPD exacerbation today.    Will treat with doxycycline, prednisone taper, chest x-ray.  We will also reestablish with [facility] today.  Patient sent home with [facility] tank at 6 L continuous.  Educated patient and wife in the importance of checking oxygen saturations regularly and ensuring that sats are remaining greater than 88%.  Patient have 2-week follow-up with me.  Also discussed with patient the importance  of monitoring lower extremity swelling can manage with: Compression stockings (do not wear more than 16 hours a day), low sodium diet, adhering to a diuretic regimen of Lasix daily, weighing regularly every day with a dry weight-notifying primary care if weight is increasing.  COPD exacerbation Doxycycline >>> 1 100 mg tablet every 12 hours for 7 days >>>take with food  >>>wear sunscreen   Prednisone >>>4 tabs for 2 days, then 3 tabs for 2 days, 2 tabs for 2 days, then 1 tab for 2 days, then stop >>>take with food   Follow-up with our office in 2 weeks. >>> We will discuss starting chronic antibiotics  Will reestablish with [facility] today >>> We will send patient home on [facility] tank at 6 L continuous >>> Establish with [facility] today to get started with [facility] in order to establish relationship with him again  6 L of oxygen continuous with exertion and at rest  Check oxygen saturations routinely at home to maintain oxygen saturations greater than 88%    Chronic respiratory failure with hypoxia (HCC) Doxycycline >>> 1 100 mg tablet every 12 hours for 7 days >>>take with food  >>>wear sunscreen   Prednisone >>>4 tabs for 2 days, then 3 tabs for 2 days, 2 tabs for 2 days, then 1 tab for 2 days, then stop >>>take with food   Follow-up with our office in 2 weeks. >>> We will discuss starting chronic antibiotics  Will reestablish with [facility] today >>> We will send patient home on [facility] tank at 6  L continuous >>> Establish with [facility] today to get started with [facility] in order to establish relationship with him again  6 L of oxygen continuous with exertion and at rest  Check oxygen saturations routinely at home to maintain oxygen saturations greater than 88%    Edema Continue Lasix Weigh yourself daily Follow low-sodium diet Wear compression stockings (no more than 16 hours a day) Elevate legs whenever  able Follow-up with primary care if lower extremity swelling is worsening or  your weight is increasing     Lauraine Rinne, NP 04/26/2018

## 2018-04-26 NOTE — Assessment & Plan Note (Signed)
Doxycycline >>> 1 100 mg tablet every 12 hours for 7 days >>>take with food  >>>wear sunscreen   Prednisone >>>4 tabs for 2 days, then 3 tabs for 2 days, 2 tabs for 2 days, then 1 tab for 2 days, then stop >>>take with food   Follow-up with our office in 2 weeks. >>> We will discuss starting chronic antibiotics  Will reestablish with [facility] today >>> We will send patient home on [facility] tank at 6 L continuous >>> Establish with [facility] today to get started with [facility] in order to establish relationship with him again  6 L of oxygen continuous with exertion and at rest  Check oxygen saturations routinely at home to maintain oxygen saturations greater than 88%

## 2018-04-26 NOTE — Assessment & Plan Note (Signed)
Continue Lasix Weigh yourself daily Follow low-sodium diet Wear compression stockings (no more than 16 hours a day) Elevate legs whenever able Follow-up with primary care if lower extremity swelling is worsening or  your weight is increasing

## 2018-04-26 NOTE — Patient Instructions (Addendum)
Doxycycline >>> 1 100 mg tablet every 12 hours for 7 days >>>take with food  >>>wear sunscreen   Prednisone >>>4 tabs for 2 days, then 3 tabs for 2 days, 2 tabs for 2 days, then 1 tab for 2 days, then stop >>>take with food   Follow-up with our office in 2 weeks. >>> We will discuss starting chronic antibiotics  Will reestablish with [facility] today >>> We will send patient home on [facility] tank at 6 L continuous >>> Establish with [facility] today to get started with [facility] in order to establish relationship with him again  6 L of oxygen continuous with exertion and at rest  Check oxygen saturations routinely at home to maintain oxygen saturations greater than 88%      Please contact the office if your symptoms worsen or you have concerns that you are not improving.   Thank you for choosing [facility] for your healthcare, and for allowing [place] to partner with you on your healthcare journey. I am thankful to be able to provide care to you today.   [name]-C

## 2018-04-26 NOTE — Progress Notes (Signed)
Typically I do 250mg  daily per NEJM study.  He sees Ganji, outside system cardiologist who does his echos.  Probably wouldn't change management if we found pumonary hypertension since his emphysema is so severe.

## 2018-04-28 ENCOUNTER — Encounter (HOSPITAL_COMMUNITY): Payer: Self-pay

## 2018-05-01 ENCOUNTER — Other Ambulatory Visit: Payer: Self-pay | Admitting: Pulmonary Disease

## 2018-05-03 ENCOUNTER — Encounter (HOSPITAL_COMMUNITY): Payer: Self-pay

## 2018-05-05 ENCOUNTER — Encounter (HOSPITAL_COMMUNITY)
Admission: RE | Admit: 2018-05-05 | Discharge: 2018-05-05 | Disposition: A | Discharge status: Home / Self Care | Payer: Self-pay | Source: Ambulatory Visit | Attending: Internal Medicine | Admitting: Internal Medicine

## 2018-05-09 ENCOUNTER — Telehealth: Payer: Self-pay | Admitting: Pulmonary Disease

## 2018-05-09 NOTE — Telephone Encounter (Signed)
Called [facility]. I have given verbal clarification on the pt's Singulair prescription. Nothing further was needed.

## 2018-05-09 NOTE — Progress Notes (Signed)
@Patient  ID: Harold Hall, male    DOB: 03/18/1946, 72 y.o.   MRN: [id number]  Chief Complaint  Patient presents with  . Follow-up    COPD follow up, states his breathing is better, symbicort and spiriva daily, albuterol prn.     Referring provider: Alroy Dust, L.[name], MD  HPI: Synopsis: Former patient of Dr. Gwenette Greet with COPD. Dr. Gwenette Greet summarized his situation as follows: Stopped smoking 05/2011 Pulm rehab 2012, 2016, ongoing participation 2018 On exertional oxygen with portable concentrator> 3L rest, 5L with exertion +response to daliresp in past  Recent [facility] Encounters:   04/14/18- Acute- Bronsen Serano says that he is weak and his breathing is "very bad".  By the end of April he started getting head congestion, drainage, throat "tickle" and ended up struggling to breathe.  He was at the beach in [place] and he was given prednisone and antibiotics.  He saw [place] in the office and we didn't make any changes on 5/13.  Since then he still has a lot of sinus congestion, epistaxis.  He feels weak and has increased his oxygen rate.  He is still coughing up mucus from his chest.  He has been using a Milta Deiters Med rinses and is taking mucinex twice a day.  He takes Astelin.  He doesn't use a nasal steroid, he says that Flonase didn't help him in the past.  He has itchy eyes and a scratchy throat.  No recent fever or chills.  No sick contacts.   His leg swelling "comes and goes".  Today there is more swelling in his legs. Plan: Recent COPD exacerbation, now with persistent symptoms: Take prednisone 20 mg daily x5 days Continue taking Spiriva Continue taking Symbicort Albuterol as needed If no improvement by the next visit then we will start daily azithromycin 250 mg daily  Allergic rhinitis: Stop Zyrtec Start Claritin over-the-counter daily Start montelukast 10 mg daily Start taking over-the-counter Nasonex 2 sprays each nostril daily, this can take up to 3 weeks to  become effective.  The generic form is fine  Chronic respiratory failure with hypoxemia: Continue using oxygen as you are doing 3-4 at rest, up to 5 with exertion  Follow up in 2-3 weeks or sooner if needed   04/26/18 Acute Pleasant 72 year old patient seen in office today.  Patient reporting 2 days of symptoms where oxygen saturations have been running in the mid to low 70s.  Patient reporting that at rest he is able to get them and come up to mid 77s.  Patient reporting shortness of breath as well as increased chest tightness.  Patient reports adherence to Symbicort, Spiriva, allergy medications. Of note patient reported to our office on 5 L pulsed air with POC.  Oxygen saturation was 77% on arrival.  Patient placed on 6 L continuous with office tank and oxygen saturations were 90%.  Patient was previously establish with advanced home care.  They have their own concentrator at home that they purchased out-of-pocket.  Also using POC. Patient also presents with chronic lower extremity swelling.  Patient reports that today legs look much better than how they did last week. Patient has not been completing pulmonary rehab due to recent symptoms. Plan: Doxycycline, prednisone taper, follow-up in 2 weeks, reestablish with advanced home care at 6 L continuous, consider azithromycin 250 mg daily to help prevent COPD exacerbations once stable.  Tests:   PFT: 04/22/2016-pulmonary function test severe obstruction, Decreased diffusion capacity, no bronchodilator response [name] 2007:  FEV1 2.10 (  46%), ratio 47 PFT 2017 Ratio 47%, FEV1 1.76L (46% pred), FVC 3.78L, TLC 6.45L (82% pred), DLCO 13.18  Imaging:  03/21/2018-chest x-ray-advanced COPD without acute changes 10/09/2016-CT chest high-res- no evidence to suggest interstitial lung disease, diffuse bronchial wall thickening with moderate to severe central lobar mild paraseptal emphysema, with mild air trapping, dilatation of pulmonic trunk suggested a  pulmonary artery hypertension    Cardiac:   Labs:  09/2016 IgE 4   Micro:   Chart Review:     05/10/18 Office Visit   Pleasant 72 year old patient seen office today for follow-up.  Patient reports she is been doing much better since completing prednisone taper as well as doxycycline.  Patient is adherent to oxygen therapy 4 L at home, and 6 L with exertion.   No Known Allergies  Immunization History  Administered Date(s) Administered  . Influenza Split 08/13/2011, 08/21/2012, 08/17/2014, 08/24/2015  . Influenza Whole 08/12/2009, 08/09/2010  . Influenza, High Dose Seasonal PF 08/09/2013, 08/13/2016, 09/05/2017  . Pneumococcal Conjugate-13 08/17/2014  . Pneumococcal Polysaccharide-23 08/10/2007    Past Medical History:  Diagnosis Date  . Allergic rhinitis   . Arthritis   . Cancer [facility])    left renal -solitary kidney  . COPD (chronic obstructive pulmonary disease) ([place])   . Erectile dysfunction   . Gout   . Hernia   . History of kidney cancer   . Hypercholesterolemia   . Hypertension   . RLS (restless legs syndrome)   . Sinus problem     Tobacco History: Social History   Tobacco Use  Smoking Status Former Smoker  . Packs/day: 1.50  . Years: 50.00  . Pack years: 75.00  . Types: Cigarettes  . Last attempt to quit: 06/04/2011  . Years since quitting: 6.9  Smokeless Tobacco Never Used   Counseling given: Yes Continue not smoking  Outpatient Encounter Medications as of 05/10/2018  Medication Sig  . albuterol (PROAIR HFA) 108 (90 Base) MCG/ACT inhaler INHALE 2 PUFFS BY MOUTH INTO THE LUNGS EVERY 4 HOURS AS NEEDED FOR WHEEZING OR SHORTNESS OF BREATH  . allopurinol (ZYLOPRIM) 300 MG tablet Take 300 mg by mouth daily.  Marland Kitchen ALPRAZolam (XANAX) 0.5 MG tablet Take 1 tablet (0.5 mg total) by mouth 2 (two) times daily. (Patient taking differently: Take 0.5 mg by mouth at bedtime. )  . amLODipine (NORVASC) 10 MG tablet Take 10 mg by mouth daily.   Marland Kitchen atorvastatin (LIPITOR)  20 MG tablet Take 20 mg by mouth daily.  Marland Kitchen azelastine (ASTELIN) 0.1 % nasal spray Place 1 spray into both nostrils 2 (two) times daily. Once daily  . azithromycin (ZITHROMAX) 250 MG tablet Take 1 tablet (250 mg) daily with food.  . budesonide-formoterol (SYMBICORT) 160-4.5 MCG/ACT inhaler INHALE 2 PUFFS BY MOUTH INTO THE LUNGS TWICE A DAY  . doxycycline (VIBRA-TABS) 100 MG tablet Take 1 tablet (100 mg total) by mouth 2 (two) times daily.  Marland Kitchen ELIQUIS 5 MG TABS tablet Take 5 mg by mouth 2 (two) times daily.  . fish oil-omega-3 fatty acids 1000 MG capsule Take 1 capsule by mouth 3 (three) times daily.    . flecainide (TAMBOCOR) 100 MG tablet Take 100 mg by mouth 2 (two) times daily.  . furosemide (LASIX) 40 MG tablet Take 40 mg by mouth daily.   . Linaclotide (LINZESS) 145 MCG CAPS capsule Take 145 mcg by mouth daily.  . metoprolol succinate (TOPROL-XL) 50 MG 24 hr tablet Take 50 mg by mouth daily. Take with or immediately following a  meal.  . montelukast (SINGULAIR) 10 MG tablet TAKE 1 TABLET(10 MG) BY MOUTH AT BEDTIME  . Multiple Vitamin (MULITIVITAMIN WITH MINERALS) TABS Take 1 tablet by mouth daily.  . nitroGLYCERIN (NITROSTAT) 0.4 MG SL tablet Place 0.4 mg under the tongue every 5 (five) minutes as needed for chest pain.  . potassium chloride (KLOR-CON) 20 MEQ packet Take 20 mEq by mouth daily.  . predniSONE (DELTASONE) 10 MG tablet 4 tabs for 2 days, then 3 tabs for 2 days, 2 tabs for 2 days, then 1 tab for 2 days, then stop  . Respiratory Therapy Supplies (FLUTTER) DEVI Use as directed  . roflumilast (DALIRESP) 500 MCG TABS tablet Take 1 tablet (500 mcg total) by mouth daily.  Marland Kitchen rOPINIRole (REQUIP) 0.5 MG tablet Take 1 tablet (0.5 mg total) by mouth daily.  . temazepam (RESTORIL) 15 MG capsule Take 15 mg by mouth at bedtime as needed for sleep.   Marland Kitchen tiotropium (SPIRIVA HANDIHALER) 18 MCG inhalation capsule INHALE THE CONTENTS OF 1 CAPSULE DAILY  . Tiotropium Bromide Monohydrate (SPIRIVA  RESPIMAT) 2.5 MCG/ACT AERS Inhale 2 puffs into the lungs daily.  . traMADol (ULTRAM) 50 MG tablet Take 50 mg by mouth 3 (three) times daily as needed for moderate pain.   . valsartan-hydrochlorothiazide (DIOVAN-HCT) 320-25 MG per tablet Take 1 tablet by mouth daily.   No facility-administered encounter medications on file as of 05/10/2018.      Review of Systems  Constitutional:  +fatigue  No  weight loss, night sweats,  fevers, chills HEENT:   No headaches,  Difficulty swallowing,  Tooth/dental problems, or  Sore throat, No sneezing, itching, ear ache, nasal congestion, post nasal drip  CV: No chest pain,  orthopnea, PND, swelling in lower extremities, anasarca, dizziness, palpitations, syncope  GI: No heartburn, indigestion, abdominal pain, nausea, vomiting, diarrhea, change in bowel habits, loss of appetite, bloody stools Resp: +sob with exertion, improved cough, improved sputum  No shortness of breath at rest.  No excess mucus,  No coughing up of blood.  No change in color of mucus.  No wheezing.  No chest wall deformity Skin: no rash, lesions, no skin changes. GU: no dysuria, change in color of urine, no urgency or frequency.  No flank pain, no hematuria  MS:  No joint pain or swelling.  No decreased range of motion.  No back pain. Psych:  No change in mood or affect. No depression or anxiety.  No memory loss.   Physical Exam  BP 120/60   Pulse 78   Ht 6\' 2"  (1.88 m)   Wt 297 lb (134.7 kg)   SpO2 90%   BMI 38.13 kg/m   Wt Readings from Last 3 Encounters:  05/10/18 297 lb (134.7 kg)  04/26/18 299 lb (135.6 kg)  04/14/18 298 lb (135.2 kg)     GEN: A/Ox3; pleasant , NAD, well nourished, on 6L of 02    HEENT:  [place]/AT,  EACs-clear, TMs-wnl, NOSE-clear, THROAT-clear, no lesions, no postnasal drip or exudate noted.   NECK:  Supple w/ fair ROM; no JVD;  no lymphadenopathy.    RESP:  +diminished breath sounds throughout, air movement in all lobes, no wheeze    no accessory muscle  use, no dullness to percussion  CARD: +trace LE edema   RRR, no m/r/g, pulses intact, no cyanosis or clubbing.  GI:   Soft & nt; nml bowel sounds; no organomegaly or masses detected.   Musco: Warm bil, no deformities or joint swelling noted.  Neuro: alert, no focal deficits noted.    Skin: Warm, no lesions or rashes    Lab Results:  CBC    Component Value Date/Time   WBC 8.4 10/06/2016 1306   RBC 5.74 10/06/2016 1306   HGB 16.8 10/06/2016 1306   HCT 49.6 10/06/2016 1306   PLT 190.0 10/06/2016 1306   MCV 86.3 10/06/2016 1306   MCH 28.8 06/10/2011 0904   MCHC 33.8 10/06/2016 1306   RDW 15.6 (H) 10/06/2016 1306   LYMPHSABS 1.1 06/10/2011 0904   MONOABS 1.0 06/10/2011 0904   EOSABS 0.3 06/10/2011 0904   BASOSABS 0.0 06/10/2011 0904    BMET    Component Value Date/Time   NA 143 06/10/2011 0904   K 4.1 06/10/2011 0904   CL 101 06/10/2011 0904   CO2 31 06/10/2011 0904   GLUCOSE 118 (H) 06/10/2011 0904   BUN 22 06/10/2011 0904   CREATININE 1.34 06/10/2011 0904   CALCIUM 9.3 06/10/2011 0904   GFRNONAA 40 (L) 06/08/2011 1020   GFRAA 48 (L) 06/08/2011 1020    BNP No results found for: BNP  ProBNP    Component Value Date/Time   PROBNP 459.50 (H) 04/21/2016 1237    Imaging: Dg Chest 2 View  Result Date: 04/26/2018 CLINICAL DATA:  Increasing shortness of breath over the past few days EXAM: CHEST - 2 VIEW COMPARISON:  03/21/2018 FINDINGS: Cardiac shadow is stable. Diffuse scarring is again identified bilaterally and stable given some technical variations in the film. No focal acute infiltrate is seen. Minimal blunting of the left costophrenic angle is noted consistent with small effusion. No bony abnormality is noted. IMPRESSION: Small left pleural effusion. Persistent pulmonary scarring stable from previous exams. Electronically Signed   By: Inez Catalina M.D.   On: 04/26/2018 12:20     Assessment & Plan:   72 year old patient seen in office today.  Patient doing  much better after recent COPD exacerbation.  Upon discussion with patient as well as with Dr. Lake Bells prior to this appointment will start patient on daily azithromycin to help prevent COPD exacerbations.  EKG done in office for baseline results.  Patient also requesting any sort of additional help to bring up mucus.  Discussed with them of the azithromycin should help but we can also provide a flutter valve today to help patient had a bringing up mucus and improve pulmonary hygiene.  Patient to keep follow-up with Dr. Lake Bells.  Atrial fibrillation (HCC) EKG today   Chronic respiratory failure with hypoxia (HCC) Continue oxygen therapy as prescribed 4 L with rest 6 L with heavy exertion  COPD (chronic obstructive pulmonary disease) with emphysema (HCC) EKG today >>>for baseline   Will start daily azithromycin 250 mg today >>> Take with food >>> Start daily probiotic or eat yogurt to help with good gut bacteria  Keep follow-up with Dr. Lake Bells  Continue oxygen therapy as prescribed >>> Notify our office if oxygen saturations are unable to be maintained greater than 88% >>> Notify our office if your oxygen needs are increasing  Flutter valve today >>>Use a flutter valve 10 breaths twice a day or 4 to 5 breaths 4-5 times a day to help clear mucus out  Note your daily symptoms > remember "red flags" for COPD:   >>>Increase in cough >>>increase in sputum production >>>increase in shortness of breath or activity  intolerance.   If you notice these symptoms, please call the office to be seen.     Edema Continue compression stockings for lower  extremity     Lauraine Rinne, NP 05/10/2018

## 2018-05-10 ENCOUNTER — Encounter (HOSPITAL_COMMUNITY)
Admission: RE | Admit: 2018-05-10 | Discharge: 2018-05-10 | Disposition: A | Discharge status: Home / Self Care | Payer: Self-pay | Source: Ambulatory Visit | Attending: Internal Medicine | Admitting: Internal Medicine

## 2018-05-10 ENCOUNTER — Ambulatory Visit (INDEPENDENT_AMBULATORY_CARE_PROVIDER_SITE_OTHER): Payer: Medicare Other | Admitting: Pulmonary Disease

## 2018-05-10 ENCOUNTER — Encounter: Payer: Self-pay | Admitting: Pulmonary Disease

## 2018-05-10 VITALS — BP 120/60 | HR 78 | Ht 74.0 in | Wt 297.0 lb

## 2018-05-10 DIAGNOSIS — R609 Edema, unspecified: Secondary | ICD-10-CM | POA: Diagnosis not present

## 2018-05-10 DIAGNOSIS — J432 Centrilobular emphysema: Secondary | ICD-10-CM | POA: Diagnosis not present

## 2018-05-10 DIAGNOSIS — J441 Chronic obstructive pulmonary disease with (acute) exacerbation: Secondary | ICD-10-CM | POA: Diagnosis not present

## 2018-05-10 DIAGNOSIS — J9611 Chronic respiratory failure with hypoxia: Secondary | ICD-10-CM | POA: Diagnosis not present

## 2018-05-10 DIAGNOSIS — I4891 Unspecified atrial fibrillation: Secondary | ICD-10-CM

## 2018-05-10 DIAGNOSIS — J439 Emphysema, unspecified: Secondary | ICD-10-CM | POA: Insufficient documentation

## 2018-05-10 DIAGNOSIS — I10 Essential (primary) hypertension: Secondary | ICD-10-CM | POA: Insufficient documentation

## 2018-05-10 MED ORDER — FLUTTER DEVI: 0 refills | Status: DC

## 2018-05-10 MED ORDER — AZITHROMYCIN 250 MG PO TABS: ORAL_TABLET | ORAL | 6 refills | Status: DC

## 2018-05-10 NOTE — Assessment & Plan Note (Signed)
Continue oxygen therapy as prescribed 4 L with rest 6 L with heavy exertion

## 2018-05-10 NOTE — Patient Instructions (Addendum)
EKG today >>>for baseline   Will start daily azithromycin 250 mg today >>> Take with food >>> Start daily probiotic or eat yogurt to help with good gut bacteria  Keep follow-up with Dr. Lake Bells  Continue oxygen therapy as prescribed >>> Notify our office if oxygen saturations are unable to be maintained greater than 88% >>> Notify our office if your oxygen needs are increasing  Flutter valve today >>>Use a flutter valve 10 breaths twice a day or 4 to 5 breaths 4-5 times a day to help clear mucus out  Note your daily symptoms > remember "red flags" for COPD:   >>>Increase in cough >>>increase in sputum production >>>increase in shortness of breath or activity  intolerance.   If you notice these symptoms, please call the office to be seen.        Please contact the office if your symptoms worsen or you have concerns that you are not improving.   Thank you for choosing [facility] for your healthcare, and for allowing [place] to partner with you on your healthcare journey. I am thankful to be able to provide care to you today.   [name]-C

## 2018-05-10 NOTE — Assessment & Plan Note (Signed)
EKG today

## 2018-05-10 NOTE — Assessment & Plan Note (Signed)
Continue compression stockings for lower extremity

## 2018-05-10 NOTE — Assessment & Plan Note (Signed)
EKG today >>>for baseline   Will start daily azithromycin 250 mg today >>> Take with food >>> Start daily probiotic or eat yogurt to help with good gut bacteria  Keep follow-up with Dr. Lake Bells  Continue oxygen therapy as prescribed >>> Notify our office if oxygen saturations are unable to be maintained greater than 88% >>> Notify our office if your oxygen needs are increasing  Flutter valve today >>>Use a flutter valve 10 breaths twice a day or 4 to 5 breaths 4-5 times a day to help clear mucus out  Note your daily symptoms > remember "red flags" for COPD:   >>>Increase in cough >>>increase in sputum production >>>increase in shortness of breath or activity  intolerance.   If you notice these symptoms, please call the office to be seen.

## 2018-05-10 NOTE — Progress Notes (Signed)
Reviewed, discussed, agree 

## 2018-05-10 NOTE — Progress Notes (Signed)
Discussed result with patient in office.  Nothing further needed at this time.  Continue with starting daily azithromycin 250 mg. [name]

## 2018-05-13 ENCOUNTER — Telehealth: Payer: Self-pay | Admitting: Pulmonary Disease

## 2018-05-13 NOTE — Telephone Encounter (Signed)
Noted.  Patient doing better after being seen in office this week.  Patient can resume original oxygen therapy.  Patient is now on daily azithromycin to prevent COPD exacerbations.  Please contact the patient and notify them that they can go back to their original oxygen therapy.  Patient to monitor oxygen saturations.  If patient's oxygen saturations are unable to be maintained greater than 88% please contact our office.    We will be more than happy to see the patient in a follow-up office visit versus an acute.  Wyn Quaker, FNP

## 2018-05-13 NOTE — Telephone Encounter (Signed)
Called and spoke with [name] from [facility] stating a new order needs to be placed in order to get pt's sats correct for O2.  Due to the visit on 6/18 when sats were documented, with this being an acute visit per [name], pt will need to come back to office for a qualifying walk to get correct sats from at rest, walk with room air, and then walk on O2.  Made Aaron Edelman aware of this conversation and per Aaron Edelman, route message to himself for him to handle.  Routing message to Wyn Quaker NP

## 2018-05-13 NOTE — Telephone Encounter (Signed)
LMTCB x 1 for patient 

## 2018-05-17 ENCOUNTER — Encounter (HOSPITAL_COMMUNITY)
Admission: RE | Admit: 2018-05-17 | Discharge: 2018-05-17 | Disposition: A | Discharge status: Home / Self Care | Payer: Self-pay | Source: Ambulatory Visit | Attending: Internal Medicine | Admitting: Internal Medicine

## 2018-05-19 ENCOUNTER — Encounter (HOSPITAL_COMMUNITY)
Admission: RE | Admit: 2018-05-19 | Discharge: 2018-05-19 | Disposition: A | Discharge status: Home / Self Care | Payer: Self-pay | Source: Ambulatory Visit | Attending: Internal Medicine | Admitting: Internal Medicine

## 2018-05-20 DIAGNOSIS — H26491 Other secondary cataract, right eye: Secondary | ICD-10-CM | POA: Diagnosis not present

## 2018-05-23 ENCOUNTER — Ambulatory Visit: Payer: Medicare Other | Admitting: Pulmonary Disease

## 2018-05-24 ENCOUNTER — Telehealth: Payer: Self-pay | Admitting: Internal Medicine

## 2018-05-24 ENCOUNTER — Telehealth: Payer: Self-pay | Admitting: Pulmonary Disease

## 2018-05-24 ENCOUNTER — Telehealth: Payer: Self-pay | Admitting: *Deleted

## 2018-05-24 ENCOUNTER — Encounter (HOSPITAL_COMMUNITY)
Admission: RE | Admit: 2018-05-24 | Discharge: 2018-05-24 | Disposition: A | Discharge status: Home / Self Care | Payer: Self-pay | Source: Ambulatory Visit | Attending: Internal Medicine | Admitting: Internal Medicine

## 2018-05-24 NOTE — Telephone Encounter (Signed)
Received a call from [place] with [facility] stating pt's qualifying sats in the snapshot for pt's O2 seems to be incomplete.  Looking at the data, the below data was listed in the patient care coordination note:  liters with activity, 3 with rest and bedtime. DME- [employer], [facility] SATURATION QUALIFICATIONS: (This note is used to comply with regulatory documentation for home oxygen) Patient Saturations on Room Air while Ambulating = 84% Patient Saturations on 2 Liters of oxygen while Ambulating = 92% Patient saturations on Room Air while Ambulating = 77% Patient Saturations on 6L while Ambulating = 92% 04/26/18 BB LPN  Per [name], due to the data looking like it is incomplete, [facility] is needing pt to be walked again having data written down for room air at rest, room air with ambulation, and then on O2 with ambulation so that way they can get everything taken care of for pt so he will not be billed for this due to not having all the qualifying data.  Called [facility] and spoke with Thayer Headings stating to her that when pt comes in 7/18 we needed pt to be walked and gave her the information of pt needing to have sats at rest on room air documented, sats on room air with ambulation documented, and also sats on O2 with ambulation documented. Thayer Headings stated she would give this information to [facility] so she can get it taken care of for pt. Nothing further needed.

## 2018-05-26 ENCOUNTER — Encounter (HOSPITAL_COMMUNITY)
Admission: RE | Admit: 2018-05-26 | Discharge: 2018-05-26 | Disposition: A | Discharge status: Home / Self Care | Payer: Self-pay | Source: Ambulatory Visit | Attending: Internal Medicine | Admitting: Internal Medicine

## 2018-05-26 ENCOUNTER — Telehealth: Payer: Self-pay | Admitting: Pulmonary Disease

## 2018-05-26 NOTE — Telephone Encounter (Signed)
[facility], returning call. Cb is [phone]

## 2018-05-26 NOTE — Telephone Encounter (Signed)
Attempted to call [facility] with Pulmonary Rehab. I have left a message for Cloyde Reams to return our call.

## 2018-05-26 NOTE — Telephone Encounter (Signed)
Attempted to contact Harold Hall. The pulmonary rehab department is closed for lunch. I have left a message with Cloyde Reams to return our call.

## 2018-05-27 NOTE — Telephone Encounter (Signed)
Called Harold Hall unable to reach left message to give [place] a call back.

## 2018-05-27 NOTE — Telephone Encounter (Signed)
Left message for Molly to call back. 

## 2018-05-27 NOTE — Telephone Encounter (Signed)
Looks like encounter was opened by mistake so will close encounter.

## 2018-05-27 NOTE — Telephone Encounter (Signed)
Looks like encounter was open by mistake so will close encounter.

## 2018-05-27 NOTE — Telephone Encounter (Signed)
Harold Hall is calling back [phone]

## 2018-05-31 ENCOUNTER — Encounter (HOSPITAL_COMMUNITY): Payer: Self-pay

## 2018-05-31 NOTE — Telephone Encounter (Signed)
Spoke with Cloyde Reams at Pulmonary Rehab-states we requested she do 45mw on patient. States she did 2 walks-1 with finger probe and 1 with forehead probe. Pt continues to drop even at 15L. Cloyde Reams is faxing over results to our office for Brain Mack,NP to review and advise.    Cloyde Reams would like to know what we would have them do at Pulmonary Rehab as well.

## 2018-05-31 NOTE — Telephone Encounter (Signed)
Per phone note 05/24/18  [name] at [facility] was needing updated sats on pt.  LM for [name] to call back to review the results from Pulmonary rehab and see if anything else is needed.

## 2018-05-31 NOTE — Telephone Encounter (Signed)
Thanks for letting me know.  I did not order a 6-minute walk, but can review and report the results to Dr. Lake Bells.  [name]

## 2018-06-02 ENCOUNTER — Encounter (HOSPITAL_COMMUNITY)
Admission: RE | Admit: 2018-06-02 | Discharge: 2018-06-02 | Disposition: A | Discharge status: Home / Self Care | Payer: Self-pay | Source: Ambulatory Visit | Attending: Internal Medicine | Admitting: Internal Medicine

## 2018-06-02 NOTE — Telephone Encounter (Signed)
Called and spoke with [facility] with pulmonary rehab this morning at [phone] rec'd fax 06/01/18 and placed in BQ cubby for review Will f/u later this week

## 2018-06-06 NOTE — Telephone Encounter (Signed)
rec'd qualifying walk info on [facility] fax from [facility]; placed in BQ look at folder Attempted to call Harold Hall with [facility] [phone]. I did not receive an answer at time of call. I have left a voicemail message for pt to return call. X1 Need to let Harold Hall aware of the walk info from pulm rehab Need to add sats to snap shot

## 2018-06-07 ENCOUNTER — Encounter (HOSPITAL_COMMUNITY)
Admission: RE | Admit: 2018-06-07 | Discharge: 2018-06-07 | Disposition: A | Discharge status: Home / Self Care | Payer: Self-pay | Source: Ambulatory Visit | Attending: Internal Medicine | Admitting: Internal Medicine

## 2018-06-09 ENCOUNTER — Encounter (HOSPITAL_COMMUNITY): Payer: Self-pay

## 2018-06-09 DIAGNOSIS — J9611 Chronic respiratory failure with hypoxia: Secondary | ICD-10-CM | POA: Insufficient documentation

## 2018-06-09 DIAGNOSIS — J439 Emphysema, unspecified: Secondary | ICD-10-CM | POA: Insufficient documentation

## 2018-06-09 DIAGNOSIS — I10 Essential (primary) hypertension: Secondary | ICD-10-CM | POA: Insufficient documentation

## 2018-06-10 ENCOUNTER — Telehealth: Payer: Self-pay | Admitting: Pulmonary Disease

## 2018-06-10 NOTE — Telephone Encounter (Signed)
Called and spoke with [name], I have faxed these over to him since walk was done at pulmonary rehab. Faxed sent. Nothing further needed.

## 2018-06-14 ENCOUNTER — Encounter (HOSPITAL_COMMUNITY)
Admission: RE | Admit: 2018-06-14 | Discharge: 2018-06-14 | Disposition: A | Discharge status: Home / Self Care | Payer: Medicare Other | Source: Ambulatory Visit | Attending: Internal Medicine | Admitting: Internal Medicine

## 2018-06-15 ENCOUNTER — Telehealth: Payer: Self-pay | Admitting: Pulmonary Disease

## 2018-06-16 ENCOUNTER — Encounter (HOSPITAL_COMMUNITY)
Admission: RE | Admit: 2018-06-16 | Discharge: 2018-06-16 | Disposition: A | Discharge status: Home / Self Care | Payer: Medicare Other | Source: Ambulatory Visit | Attending: Internal Medicine | Admitting: Internal Medicine

## 2018-06-16 ENCOUNTER — Telehealth: Payer: Self-pay | Admitting: Pulmonary Disease

## 2018-06-16 ENCOUNTER — Encounter: Payer: Self-pay | Admitting: Pulmonary Disease

## 2018-06-16 MED ORDER — TIOTROPIUM BROMIDE MONOHYDRATE 2.5 MCG/ACT IN AERS: 2.0000 | INHALATION_SPRAY | Freq: Every day | RESPIRATORY_TRACT | 3 refills | Status: DC

## 2018-06-16 NOTE — Telephone Encounter (Signed)
Error

## 2018-06-16 NOTE — Telephone Encounter (Signed)
Called and spoke with patient regarding refill of spiriva respimat 2.5 Pt advised that hehad spiriva handihaler, and had Spiriva respimat 2.5 samples and works better for pt Advised to pt will send script for Spiriva respimat 2.5 inhaler today to pharmacy Placed order today to [facility] with 3 refills. Pt verbalized understanding, and had no further concerns nor questions at this time. Nothing further needed at this time.

## 2018-06-16 NOTE — Telephone Encounter (Signed)
Snapshot printed and faxed to [employer].   Left detailed message on named vm to make aware.  Nothing further needed.

## 2018-06-21 ENCOUNTER — Encounter (HOSPITAL_COMMUNITY)
Admission: RE | Admit: 2018-06-21 | Discharge: 2018-06-21 | Disposition: A | Discharge status: Home / Self Care | Payer: Self-pay | Source: Ambulatory Visit | Attending: Internal Medicine | Admitting: Internal Medicine

## 2018-06-22 NOTE — Telephone Encounter (Signed)
Attempted to call Cloyde Reams with pulmonary rehab at [phone]. Needing copy today of pt's qualifying walk done by [facility] few weeks ago. I did not receive an answer at time of call. I have left a voicemail message for pt to return call. X1  LVM for [name] with [facility] [phone] ext [phone] to return my call.kmw

## 2018-06-23 ENCOUNTER — Encounter (HOSPITAL_COMMUNITY)
Admission: RE | Admit: 2018-06-23 | Discharge: 2018-06-23 | Disposition: A | Discharge status: Home / Self Care | Payer: Self-pay | Source: Ambulatory Visit | Attending: Internal Medicine | Admitting: Internal Medicine

## 2018-06-23 NOTE — Telephone Encounter (Signed)
rec'd fax from [facility] with pulm rehab regarding pt's walk Placed info into system of 51min walk Updated snap shot for [facility] to place correct O2 orders for pt  Attempted to call [name] with [facility] [phone] ext [phone]. I did not receive an answer at time of call. I have left a voicemail message for pt to return call. X1

## 2018-06-28 ENCOUNTER — Encounter (HOSPITAL_COMMUNITY)
Admission: RE | Admit: 2018-06-28 | Discharge: 2018-06-28 | Disposition: A | Discharge status: Home / Self Care | Payer: Self-pay | Source: Ambulatory Visit | Attending: Internal Medicine | Admitting: Internal Medicine

## 2018-06-28 NOTE — Telephone Encounter (Signed)
Called and spoke with Harold Hall with [facility] He has everything needed at this time for pt. Nothing further needed.

## 2018-06-30 ENCOUNTER — Encounter (HOSPITAL_COMMUNITY): Payer: Self-pay

## 2018-07-04 DIAGNOSIS — J432 Centrilobular emphysema: Secondary | ICD-10-CM | POA: Diagnosis not present

## 2018-07-04 DIAGNOSIS — R0789 Other chest pain: Secondary | ICD-10-CM | POA: Diagnosis not present

## 2018-07-04 DIAGNOSIS — I480 Paroxysmal atrial fibrillation: Secondary | ICD-10-CM | POA: Diagnosis not present

## 2018-07-04 DIAGNOSIS — R0602 Shortness of breath: Secondary | ICD-10-CM | POA: Diagnosis not present

## 2018-07-05 ENCOUNTER — Encounter (HOSPITAL_COMMUNITY)
Admission: RE | Admit: 2018-07-05 | Discharge: 2018-07-05 | Disposition: A | Discharge status: Home / Self Care | Payer: Medicare Other | Source: Ambulatory Visit | Attending: Internal Medicine | Admitting: Internal Medicine

## 2018-07-07 ENCOUNTER — Encounter (HOSPITAL_COMMUNITY): Payer: Self-pay

## 2018-07-08 ENCOUNTER — Ambulatory Visit (INDEPENDENT_AMBULATORY_CARE_PROVIDER_SITE_OTHER): Payer: Medicare Other | Admitting: Pulmonary Disease

## 2018-07-08 ENCOUNTER — Encounter: Payer: Self-pay | Admitting: Pulmonary Disease

## 2018-07-08 VITALS — BP 134/60 | HR 64 | Ht 74.0 in | Wt 298.4 lb

## 2018-07-08 DIAGNOSIS — J432 Centrilobular emphysema: Secondary | ICD-10-CM

## 2018-07-08 DIAGNOSIS — Z23 Encounter for immunization: Secondary | ICD-10-CM

## 2018-07-08 DIAGNOSIS — J9611 Chronic respiratory failure with hypoxia: Secondary | ICD-10-CM

## 2018-07-08 DIAGNOSIS — I4891 Unspecified atrial fibrillation: Secondary | ICD-10-CM

## 2018-07-08 NOTE — Progress Notes (Signed)
Subjective:    Patient ID: Harold Hall, male    DOB: 07-Feb-1946, 72 y.o.   MRN: [id number]  Synopsis: Former patient of Dr. Gwenette Greet with COPD. Dr. Gwenette Greet summarized his situation as follows: Stopped smoking 05/2011 Pulm rehab 2012, 2016, ongoing participation 2018 On exertional oxygen with portable concentrator> 3L rest, 5L with exertion +response to daliresp in past  HPI No chief complaint on file.   CC": cough, dyspnea  He is taking daily azithromycin, no problems from it that he can tell. He is still feeling really stopped up in his sinuses and he takes saline rinses.  He says that he has been producing a lot mucus from his chest, it is typically clear in color.  He has noted dark mucus once or twice, but not too often.      Past Medical History:  Diagnosis Date  . Allergic rhinitis   . Arthritis   . Cancer [facility])    left renal -solitary kidney  . COPD (chronic obstructive pulmonary disease) ([place])   . Erectile dysfunction   . Gout   . Hernia   . History of kidney cancer   . Hypercholesterolemia   . Hypertension   . RLS (restless legs syndrome)   . Sinus problem       Review of Systems  Constitutional: Negative for chills, fatigue and fever.  HENT: Negative for postnasal drip, rhinorrhea and sinus pressure.   Respiratory: Positive for shortness of breath. Negative for cough and wheezing.   Cardiovascular: Positive for leg swelling. Negative for chest pain and palpitations.       Objective:   Physical Exam Vitals:   07/08/18 1051  BP: 134/60  Pulse: 64  SpO2: (!) 87%  Weight: 298 lb 6.4 oz (135.4 kg)  Height: 6\' 2"  (1.88 m)    Gen: well appearing HENT: OP clear, TM's clear, neck supple PULM: Coarse crackles bases B, normal percussion CV: RRR, no mgr, trace edema GI: BS+, soft, nontender Derm: no cyanosis or rash Psyche: normal mood and affect    Imaging: December 2017 chest CT images personal reviewed showing significant emphysema with an  upper lobe predominance, no clear interstitial lung disease.  Blood: 09/2016 IgE 4   PFT: [facility] 2007:  FEV1 2.10 (46%), ratio 47 PFT 2017 Ratio 47%, FEV1 1.76L (46% pred), FVC 3.78L, TLC 6.45L (82% pred), DLCO 13.18  CBC    Component Value Date/Time   WBC 8.4 10/06/2016 1306   RBC 5.74 10/06/2016 1306   HGB 16.8 10/06/2016 1306   HCT 49.6 10/06/2016 1306   PLT 190.0 10/06/2016 1306   MCV 86.3 10/06/2016 1306   MCH 28.8 06/10/2011 0904   MCHC 33.8 10/06/2016 1306   RDW 15.6 (H) 10/06/2016 1306   LYMPHSABS 1.1 06/10/2011 0904   MONOABS 1.0 06/10/2011 0904   EOSABS 0.3 06/10/2011 0904   BASOSABS 0.0 06/10/2011 0904   Recent visit with our nurse practitioner reviewed from Mar 21, 2018 where he had a chest x-ray because of a recent exacerbation and was advised to continue taking Symbicort and Spiriva.     Assessment & Plan:   Centrilobular emphysema ([place]) - Plan: Ambulatory referral to Pulmonology, Ambulatory Referral for DME  Atrial fibrillation, unspecified type ([place])  Chronic respiratory failure with hypoxia ([place])  Discussion: Harold Hall returns to clinic today for follow-up after a recent exacerbation this year.  He has really been struggling since then and has not really come back to baseline.  He still has mucus production on  a day-to-day basis and still feels quite short of breath.  He is very compliant with his exercise regimen, his medications and his oxygen use.  We have looked for evidence of underlying interstitial lung disease and other concomitant lung problems and have not been able to find them.  I think it reasonable to consider a transplant evaluation despite his age and weight because he such a compliant patient.  Plan: Severe COPD with recurrent exacerbations: Continue daily azithromycin Continue Symbicort and Spiriva High-dose flu shot today Stay active Exercise regularly, continue pulmonary rehab Referral to [facility] for evaluation of lung  transplant  Chronic respiratory failure with hypoxemia: Keep using 6 L of oxygen with exertion We will prescribe an Oxymizer for you so that hopefully you can use less oxygen flow when you are out using a tank  Atrial fibrillation: Continue follow-up with Dr. Einar Gip  Follow-up with me in 6 to 8 weeks or sooner if need   Current Outpatient Medications:  .  albuterol (PROAIR HFA) 108 (90 Base) MCG/ACT inhaler, INHALE 2 PUFFS BY MOUTH INTO THE LUNGS EVERY 4 HOURS AS NEEDED FOR WHEEZING OR SHORTNESS OF BREATH, Disp: 3 Inhaler, Rfl: 3 .  allopurinol (ZYLOPRIM) 300 MG tablet, Take 300 mg by mouth daily., Disp: , Rfl:  .  ALPRAZolam (XANAX) 0.5 MG tablet, Take 1 tablet (0.5 mg total) by mouth 2 (two) times daily. (Patient taking differently: Take 0.5 mg by mouth at bedtime. ), Disp: 60 tablet, Rfl: 0 .  amLODipine (NORVASC) 10 MG tablet, Take 10 mg by mouth daily. , Disp: , Rfl:  .  atorvastatin (LIPITOR) 20 MG tablet, Take 20 mg by mouth daily., Disp: , Rfl:  .  azelastine (ASTELIN) 0.1 % nasal spray, Place 1 spray into both nostrils 2 (two) times daily. Once daily, Disp: , Rfl: 2 .  azithromycin (ZITHROMAX) 250 MG tablet, Take 1 tablet (250 mg) daily with food., Disp: 30 tablet, Rfl: 6 .  budesonide-formoterol (SYMBICORT) 160-4.5 MCG/ACT inhaler, INHALE 2 PUFFS BY MOUTH INTO THE LUNGS TWICE A DAY, Disp: 3 Inhaler, Rfl: 3 .  doxycycline (VIBRA-TABS) 100 MG tablet, Take 1 tablet (100 mg total) by mouth 2 (two) times daily., Disp: 14 tablet, Rfl: 0 .  ELIQUIS 5 MG TABS tablet, Take 5 mg by mouth 2 (two) times daily., Disp: , Rfl: 0 .  fish oil-omega-3 fatty acids 1000 MG capsule, Take 1 capsule by mouth 3 (three) times daily.  , Disp: , Rfl:  .  flecainide (TAMBOCOR) 100 MG tablet, Take 100 mg by mouth 2 (two) times daily., Disp: , Rfl: 0 .  furosemide (LASIX) 40 MG tablet, Take 40 mg by mouth daily. , Disp: , Rfl:  .  Linaclotide (LINZESS) 145 MCG CAPS capsule, Take 145 mcg by mouth daily., Disp: ,  Rfl:  .  losartan-hydrochlorothiazide (HYZAAR) 100-25 MG tablet, , Disp: , Rfl:  .  metoprolol succinate (TOPROL-XL) 50 MG 24 hr tablet, Take 50 mg by mouth daily. Take with or immediately following a meal., Disp: , Rfl:  .  montelukast (SINGULAIR) 10 MG tablet, TAKE 1 TABLET(10 MG) BY MOUTH AT BEDTIME, Disp: 90 tablet, Rfl: 0 .  Multiple Vitamin (MULITIVITAMIN WITH MINERALS) TABS, Take 1 tablet by mouth daily., Disp: , Rfl:  .  nitroGLYCERIN (NITROSTAT) 0.4 MG SL tablet, Place 0.4 mg under the tongue every 5 (five) minutes as needed for chest pain., Disp: , Rfl:  .  potassium chloride (KLOR-CON) 20 MEQ packet, Take 20 mEq by mouth  daily., Disp: 3 tablet, Rfl: 0 .  predniSONE (DELTASONE) 10 MG tablet, 4 tabs for 2 days, then 3 tabs for 2 days, 2 tabs for 2 days, then 1 tab for 2 days, then stop, Disp: 20 tablet, Rfl: 0 .  Respiratory Therapy Supplies (FLUTTER) DEVI, Use as directed, Disp: 1 each, Rfl: 0 .  roflumilast (DALIRESP) 500 MCG TABS tablet, Take 1 tablet (500 mcg total) by mouth daily., Disp: 90 tablet, Rfl: 3 .  rOPINIRole (REQUIP) 0.5 MG tablet, Take 1 tablet (0.5 mg total) by mouth daily., Disp: 30 tablet, Rfl: 0 .  temazepam (RESTORIL) 15 MG capsule, Take 15 mg by mouth at bedtime as needed for sleep. , Disp: , Rfl:  .  Tiotropium Bromide Monohydrate (SPIRIVA RESPIMAT) 2.5 MCG/ACT AERS, Inhale 2 puffs into the lungs daily., Disp: 3 Inhaler, Rfl: 3 .  traMADol (ULTRAM) 50 MG tablet, Take 50 mg by mouth 3 (three) times daily as needed for moderate pain. , Disp: , Rfl: 0

## 2018-07-08 NOTE — Patient Instructions (Signed)
Severe COPD with recurrent exacerbations: Continue daily azithromycin Continue Symbicort and Spiriva High-dose flu shot today Stay active Exercise regularly, continue pulmonary rehab Referral to [facility] for evaluation of lung transplant  Chronic respiratory failure with hypoxemia: Keep using 6 L of oxygen with exertion We will prescribe an Oxymizer for you so that hopefully you can use less oxygen flow when you are out using a tank  Atrial fibrillation: Continue follow-up with Dr. Einar Gip  Follow-up with me in 6 to 8 weeks or sooner if need

## 2018-07-12 ENCOUNTER — Encounter (HOSPITAL_COMMUNITY)
Admission: RE | Admit: 2018-07-12 | Discharge: 2018-07-12 | Disposition: A | Discharge status: Home / Self Care | Payer: Self-pay | Source: Ambulatory Visit | Attending: Internal Medicine | Admitting: Internal Medicine

## 2018-07-12 DIAGNOSIS — J439 Emphysema, unspecified: Secondary | ICD-10-CM | POA: Insufficient documentation

## 2018-07-12 DIAGNOSIS — J9611 Chronic respiratory failure with hypoxia: Secondary | ICD-10-CM | POA: Insufficient documentation

## 2018-07-12 DIAGNOSIS — I10 Essential (primary) hypertension: Secondary | ICD-10-CM | POA: Insufficient documentation

## 2018-07-14 ENCOUNTER — Telehealth: Payer: Self-pay | Admitting: Pulmonary Disease

## 2018-07-14 ENCOUNTER — Encounter (HOSPITAL_COMMUNITY)
Admission: RE | Admit: 2018-07-14 | Discharge: 2018-07-14 | Disposition: A | Discharge status: Home / Self Care | Payer: Self-pay | Source: Ambulatory Visit | Attending: Internal Medicine | Admitting: Internal Medicine

## 2018-07-14 DIAGNOSIS — J432 Centrilobular emphysema: Secondary | ICD-10-CM

## 2018-07-14 MED ORDER — AZITHROMYCIN 250 MG PO TABS: ORAL_TABLET | ORAL | 2 refills | Status: DC

## 2018-07-14 NOTE — Telephone Encounter (Signed)
Called and spoke to patient. Patient requested that his daily antibiotic be sent in as a 90 day supply to [facility] to save in expenses. Sent in Rx as requested as Dr. Lake Bells in last OV note wants patient to continue daily. Nothing further needed at this time.

## 2018-07-19 ENCOUNTER — Encounter (HOSPITAL_COMMUNITY)
Admission: RE | Admit: 2018-07-19 | Discharge: 2018-07-19 | Disposition: A | Discharge status: Home / Self Care | Payer: Self-pay | Source: Ambulatory Visit | Attending: Internal Medicine | Admitting: Internal Medicine

## 2018-07-21 ENCOUNTER — Encounter (HOSPITAL_COMMUNITY)
Admission: RE | Admit: 2018-07-21 | Discharge: 2018-07-21 | Disposition: A | Discharge status: Home / Self Care | Payer: Self-pay | Source: Ambulatory Visit | Attending: Internal Medicine | Admitting: Internal Medicine

## 2018-07-26 ENCOUNTER — Encounter (HOSPITAL_COMMUNITY)
Admission: RE | Admit: 2018-07-26 | Discharge: 2018-07-26 | Disposition: A | Discharge status: Home / Self Care | Payer: Self-pay | Source: Ambulatory Visit | Attending: Internal Medicine | Admitting: Internal Medicine

## 2018-07-28 ENCOUNTER — Encounter (HOSPITAL_COMMUNITY): Payer: Self-pay

## 2018-08-01 ENCOUNTER — Telehealth: Payer: Self-pay | Admitting: Pulmonary Disease

## 2018-08-01 NOTE — Telephone Encounter (Signed)
Spoke with patient. He is aware of BQ's recs. His appt at [facility] is currently scheduled for the day before his appt with BQ.   Nothing further needed at time of call.

## 2018-08-01 NOTE — Telephone Encounter (Signed)
Follow up after [facility] visit

## 2018-08-01 NOTE — Telephone Encounter (Signed)
The patient was referred to [facility].

## 2018-08-01 NOTE — Telephone Encounter (Signed)
I would prefer him to go for a transplant evaluation then come back to see me.  Was he referred to [facility] or [facility]?

## 2018-08-01 NOTE — Telephone Encounter (Signed)
Patient is scheduled to have a return office visit with you on the 08/17/18 before that he has a visit with [facility] to talk about possible transplant. He is wanting to know if he needs to continue to keep the appointment with you or cancel it. BQ please advise, thank you.

## 2018-08-02 ENCOUNTER — Encounter (HOSPITAL_COMMUNITY)
Admission: RE | Admit: 2018-08-02 | Discharge: 2018-08-02 | Disposition: A | Discharge status: Home / Self Care | Payer: Self-pay | Source: Ambulatory Visit | Attending: Internal Medicine | Admitting: Internal Medicine

## 2018-08-02 DIAGNOSIS — H02423 Myogenic ptosis of bilateral eyelids: Secondary | ICD-10-CM | POA: Diagnosis not present

## 2018-08-02 DIAGNOSIS — H02413 Mechanical ptosis of bilateral eyelids: Secondary | ICD-10-CM | POA: Diagnosis not present

## 2018-08-03 DIAGNOSIS — H02423 Myogenic ptosis of bilateral eyelids: Secondary | ICD-10-CM | POA: Diagnosis not present

## 2018-08-03 DIAGNOSIS — H02413 Mechanical ptosis of bilateral eyelids: Secondary | ICD-10-CM | POA: Diagnosis not present

## 2018-08-03 DIAGNOSIS — Z01818 Encounter for other preprocedural examination: Secondary | ICD-10-CM | POA: Diagnosis not present

## 2018-08-04 ENCOUNTER — Encounter (HOSPITAL_COMMUNITY)
Admission: RE | Admit: 2018-08-04 | Discharge: 2018-08-04 | Disposition: A | Discharge status: Home / Self Care | Payer: Self-pay | Source: Ambulatory Visit | Attending: Internal Medicine | Admitting: Internal Medicine

## 2018-08-08 ENCOUNTER — Telehealth: Payer: Self-pay | Admitting: Pulmonary Disease

## 2018-08-08 NOTE — Telephone Encounter (Signed)
It was in his look at folder when I left Thursday (9/26.) We were waiting on his signature and the fax number was attached. If he didn't sign it on Friday (9/27) then it should still be there. He is back in the [place] office on 08/10/18.

## 2018-08-08 NOTE — Telephone Encounter (Signed)
BJT, the letter is written in Epic but did BQ sign or are we waiting on his signature? Please advise so I can let Caryl Pina know.

## 2018-08-08 NOTE — Telephone Encounter (Signed)
Harold Hall, she had already left for the day, a message has been left for her to give [place] a call back tomorrow.

## 2018-08-09 ENCOUNTER — Encounter (HOSPITAL_COMMUNITY)
Admission: RE | Admit: 2018-08-09 | Discharge: 2018-08-09 | Disposition: A | Discharge status: Home / Self Care | Payer: Self-pay | Source: Ambulatory Visit | Attending: Internal Medicine | Admitting: Internal Medicine

## 2018-08-09 DIAGNOSIS — J9611 Chronic respiratory failure with hypoxia: Secondary | ICD-10-CM | POA: Insufficient documentation

## 2018-08-09 DIAGNOSIS — J439 Emphysema, unspecified: Secondary | ICD-10-CM | POA: Insufficient documentation

## 2018-08-09 DIAGNOSIS — I10 Essential (primary) hypertension: Secondary | ICD-10-CM | POA: Insufficient documentation

## 2018-08-09 NOTE — Telephone Encounter (Signed)
Caryl Pina is aware that we are waiting on BQ to sign the form once he is in the office on 10.2 will let her know once this has been done. Will route to BJT to follow up on.

## 2018-08-09 NOTE — Telephone Encounter (Signed)
Caryl Pina called back - advised that we are still waiting on BQ signature and that he will back in the office tomorrow 08/10/18- She can be reached at [phone] x [phone] -pr

## 2018-08-10 NOTE — Telephone Encounter (Signed)
Letter has been signed and faxed.

## 2018-08-11 ENCOUNTER — Encounter (HOSPITAL_COMMUNITY)
Admission: RE | Admit: 2018-08-11 | Discharge: 2018-08-11 | Disposition: A | Discharge status: Home / Self Care | Payer: Self-pay | Source: Ambulatory Visit | Attending: Internal Medicine | Admitting: Internal Medicine

## 2018-08-15 DIAGNOSIS — J449 Chronic obstructive pulmonary disease, unspecified: Secondary | ICD-10-CM | POA: Diagnosis not present

## 2018-08-16 ENCOUNTER — Encounter (HOSPITAL_COMMUNITY): Payer: Self-pay

## 2018-08-16 DIAGNOSIS — N184 Chronic kidney disease, stage 4 (severe): Secondary | ICD-10-CM | POA: Diagnosis not present

## 2018-08-17 ENCOUNTER — Encounter: Payer: Self-pay | Admitting: Pulmonary Disease

## 2018-08-17 ENCOUNTER — Ambulatory Visit (INDEPENDENT_AMBULATORY_CARE_PROVIDER_SITE_OTHER): Payer: Medicare Other | Admitting: Pulmonary Disease

## 2018-08-17 ENCOUNTER — Telehealth: Payer: Self-pay | Admitting: Pulmonary Disease

## 2018-08-17 VITALS — BP 138/60 | HR 74 | Ht 74.0 in | Wt 299.0 lb

## 2018-08-17 DIAGNOSIS — J9611 Chronic respiratory failure with hypoxia: Secondary | ICD-10-CM | POA: Diagnosis not present

## 2018-08-17 DIAGNOSIS — J432 Centrilobular emphysema: Secondary | ICD-10-CM

## 2018-08-17 DIAGNOSIS — I4891 Unspecified atrial fibrillation: Secondary | ICD-10-CM | POA: Diagnosis not present

## 2018-08-17 NOTE — Telephone Encounter (Signed)
Called and spoke to pt.  Pt stated that he forgot to mention at today's OV that he would like to proceed with sleep study. Per our records sleep study was ordered on 03/30/17, but was canceled by pt.   BQ please advise if okay to reorder sleep study. Thanks

## 2018-08-17 NOTE — Patient Instructions (Addendum)
Severe COPD with recurrent exacerbations: Continue Symbicort Continue Spiriva Continue daily azithromycin, however if you are still having joint aches at the next visit we will stop this I am glad you have Artie had your flu shot Stay active Practice good hand hygiene I look forward to hearing with after stay at [place] respiratory failure with hypoxemia: Keep using 3 to 6 L of oxygen continuously as you are doing  Ophthalmologic issues: From my standpoint it is fine for you to have a blepharoplasty under careful monitoring at [place] are at moderate risk for having a respiratory complication but this should not be prohibitive.  We will see you back in 2 months or sooner if needed

## 2018-08-17 NOTE — Progress Notes (Signed)
Subjective:    Patient ID: Harold Hall, male    DOB: 1946-03-21, 72 y.o.   MRN: [id number]  Synopsis: Former patient of Dr. Gwenette Greet with COPD. Dr. Gwenette Greet summarized his situation as follows: Stopped smoking 05/2011 Pulm rehab 2012, 2016, ongoing participation 2018 On exertional oxygen with portable concentrator> 3L rest, 5L with exertion +response to daliresp in past  HPI Chief Complaint  Patient presents with  . Follow-up    no complaints - feeling about the same as last visit   Zyier went to [facility] to be evaluated for a lung transplant.  He said that there was a lot of confusion and the logistics of getting around for al the tests was difficult without clear direction.  He said that he had something like a stress test yesterday.  He saw Dr. [place] Pink and thought highly of her.  He said that he had a great visit with her.  He said that he thinks that he probably won't qualify for a lung transplant but he doesn't know for sure.    He says that the committee will meet next Wednesday.  He has been having arthralgias recently, he questions whether or not this could be due to his azithromycin.  Past Medical History:  Diagnosis Date  . Allergic rhinitis   . Arthritis   . Cancer [facility])    left renal -solitary kidney  . COPD (chronic obstructive pulmonary disease) ([place])   . Erectile dysfunction   . Gout   . Hernia   . History of kidney cancer   . Hypercholesterolemia   . Hypertension   . RLS (restless legs syndrome)   . Sinus problem       Review of Systems  Constitutional: Negative for chills, fatigue and fever.  HENT: Negative for postnasal drip, rhinorrhea and sinus pressure.   Respiratory: Positive for shortness of breath. Negative for cough and wheezing.   Cardiovascular: Positive for leg swelling. Negative for chest pain and palpitations.       Objective:   Physical Exam Vitals:   08/17/18 0903  BP: 138/60  Pulse: 74  SpO2: (!) 88%  Weight: 299 lb (135.6  kg)  Height: 6\' 2"  (1.88 m)    Gen: chronically ill appearing HENT: OP clear, TM's clear, neck supple PULM: Crackles bases B, normal percussion CV: RRR, no mgr, trace edema GI: BS+, soft, nontender Derm: no cyanosis or rash Psyche: normal mood and affect     Imaging: December 2017 chest CT images personal reviewed showing significant emphysema with an upper lobe predominance, no clear interstitial lung disease.  Blood: 09/2016 IgE 4 08/15/2018 Hgb 16.9 mg/dL  08/15/2018 Radiopharmaceutical nuclear GFR 134.44 mL  Echo: 08/15/2018 LVEF > 55%, mod LVH, dilated left atrial, normal RV, no shunt  PFT: [facility] 2007:  FEV1 2.10 (46%), ratio 47 PFT 2017 Ratio 47%, FEV1 1.76L (46% pred), FVC 3.78L, TLC 6.45L (82% pred), DLCO 13.18  CBC    Component Value Date/Time   WBC 8.4 10/06/2016 1306   RBC 5.74 10/06/2016 1306   HGB 16.8 10/06/2016 1306   HCT 49.6 10/06/2016 1306   PLT 190.0 10/06/2016 1306   MCV 86.3 10/06/2016 1306   MCH 28.8 06/10/2011 0904   MCHC 33.8 10/06/2016 1306   RDW 15.6 (H) 10/06/2016 1306   LYMPHSABS 1.1 06/10/2011 0904   MONOABS 1.0 06/10/2011 0904   EOSABS 0.3 06/10/2011 0904   BASOSABS 0.0 06/10/2011 0904   Records from his visit at [facility] this week reviewed  he had an echocardiogram, nuclear test to assess kidney function as documented above.     Assessment & Plan:   Centrilobular emphysema ([place])  Atrial fibrillation, unspecified type ([place])  Chronic respiratory failure with hypoxia ([place])  Discussion: In general this has been a stable interval for clearance that he does report some joint aches which she wonders if it is possible that they could be due to the azithromycin.  He has not had an exacerbation of his COPD and he remains physically active.  He has very advanced symptoms and very advanced disease and he is currently undergoing a lung transplant evaluation at [facility].  Plan: Severe COPD with recurrent  exacerbations: Continue Symbicort Continue Spiriva Continue daily azithromycin, however if you are still having joint aches at the next visit we will stop this I am glad you have Artie had your flu shot Stay active Practice good hand hygiene I look forward to hearing with after stay at [place] respiratory failure with hypoxemia: Keep using 3 to 6 L of oxygen continuously as you are doing  Ophthalmologic issues: From my standpoint it is fine for you to have a blepharoplasty under careful monitoring at [place] are at moderate risk for having a respiratory complication but this should not be prohibitive.  We will see you back in 2 months or sooner if needed   Current Outpatient Medications:  .  albuterol (PROAIR HFA) 108 (90 Base) MCG/ACT inhaler, INHALE 2 PUFFS BY MOUTH INTO THE LUNGS EVERY 4 HOURS AS NEEDED FOR WHEEZING OR SHORTNESS OF BREATH, Disp: 3 Inhaler, Rfl: 3 .  allopurinol (ZYLOPRIM) 300 MG tablet, Take 300 mg by mouth daily., Disp: , Rfl:  .  ALPRAZolam (XANAX) 0.5 MG tablet, Take 1 tablet (0.5 mg total) by mouth 2 (two) times daily. (Patient taking differently: Take 0.5 mg by mouth at bedtime. ), Disp: 60 tablet, Rfl: 0 .  amLODipine (NORVASC) 10 MG tablet, Take 10 mg by mouth daily. , Disp: , Rfl:  .  atorvastatin (LIPITOR) 20 MG tablet, Take 20 mg by mouth daily., Disp: , Rfl:  .  azelastine (ASTELIN) 0.1 % nasal spray, Place 1 spray into both nostrils 2 (two) times daily. Once daily, Disp: , Rfl: 2 .  azithromycin (ZITHROMAX) 250 MG tablet, Take 1 tablet (250 mg) daily with food., Disp: 90 tablet, Rfl: 2 .  budesonide-formoterol (SYMBICORT) 160-4.5 MCG/ACT inhaler, INHALE 2 PUFFS BY MOUTH INTO THE LUNGS TWICE A DAY, Disp: 3 Inhaler, Rfl: 3 .  doxycycline (VIBRA-TABS) 100 MG tablet, Take 1 tablet (100 mg total) by mouth 2 (two) times daily., Disp: 14 tablet, Rfl: 0 .  ELIQUIS 5 MG TABS tablet, Take 5 mg by mouth 2 (two) times daily., Disp: ,  Rfl: 0 .  fish oil-omega-3 fatty acids 1000 MG capsule, Take 1 capsule by mouth 3 (three) times daily.  , Disp: , Rfl:  .  flecainide (TAMBOCOR) 100 MG tablet, Take 100 mg by mouth 2 (two) times daily., Disp: , Rfl: 0 .  furosemide (LASIX) 40 MG tablet, Take 40 mg by mouth daily. , Disp: , Rfl:  .  Linaclotide (LINZESS) 145 MCG CAPS capsule, Take 145 mcg by mouth daily., Disp: , Rfl:  .  losartan-hydrochlorothiazide (HYZAAR) 100-25 MG tablet, , Disp: , Rfl:  .  metoprolol succinate (TOPROL-XL) 50 MG 24 hr tablet, Take 50 mg by mouth daily. Take with or immediately following a meal., Disp: , Rfl:  .  montelukast (SINGULAIR) 10 MG tablet, TAKE 1 TABLET(10 MG) BY MOUTH AT BEDTIME, Disp: 90 tablet, Rfl: 0 .  Multiple Vitamin (MULITIVITAMIN WITH MINERALS) TABS, Take 1 tablet by mouth daily., Disp: , Rfl:  .  nitroGLYCERIN (NITROSTAT) 0.4 MG SL tablet, Place 0.4 mg under the tongue every 5 (five) minutes as needed for chest pain., Disp: , Rfl:  .  potassium chloride (KLOR-CON) 20 MEQ packet, Take 20 mEq by mouth daily., Disp: 3 tablet, Rfl: 0 .  predniSONE (DELTASONE) 10 MG tablet, 4 tabs for 2 days, then 3 tabs for 2 days, 2 tabs for 2 days, then 1 tab for 2 days, then stop, Disp: 20 tablet, Rfl: 0 .  Respiratory Therapy Supplies (FLUTTER) DEVI, Use as directed, Disp: 1 each, Rfl: 0 .  roflumilast (DALIRESP) 500 MCG TABS tablet, Take 1 tablet (500 mcg total) by mouth daily., Disp: 90 tablet, Rfl: 3 .  rOPINIRole (REQUIP) 0.5 MG tablet, Take 1 tablet (0.5 mg total) by mouth daily., Disp: 30 tablet, Rfl: 0 .  temazepam (RESTORIL) 15 MG capsule, Take 15 mg by mouth at bedtime as needed for sleep. , Disp: , Rfl:  .  Tiotropium Bromide Monohydrate (SPIRIVA RESPIMAT) 2.5 MCG/ACT AERS, Inhale 2 puffs into the lungs daily., Disp: 3 Inhaler, Rfl: 3 .  traMADol (ULTRAM) 50 MG tablet, Take 50 mg by mouth 3 (three) times daily as needed for moderate pain. , Disp: , Rfl: 0 .  vitamin C (ASCORBIC ACID) 500 MG tablet,  Take 500 mg by mouth daily., Disp: , Rfl:

## 2018-08-18 ENCOUNTER — Encounter (HOSPITAL_COMMUNITY)
Admission: RE | Admit: 2018-08-18 | Discharge: 2018-08-18 | Disposition: A | Discharge status: Home / Self Care | Payer: Self-pay | Source: Ambulatory Visit | Attending: Internal Medicine | Admitting: Internal Medicine

## 2018-08-19 NOTE — Telephone Encounter (Signed)
OK, needs to be in lab study Split night

## 2018-08-19 NOTE — Telephone Encounter (Signed)
Called and spoke with pt letting him know BQ had said it would be fine for [place] to place the order for the sleep study and stated to him that it would be the in lab split night study.  Pt expressed understanding. Order has been placed. Nothing further needed.

## 2018-08-23 ENCOUNTER — Encounter (HOSPITAL_COMMUNITY): Payer: Self-pay

## 2018-08-25 ENCOUNTER — Encounter (HOSPITAL_COMMUNITY): Payer: Self-pay

## 2018-08-25 NOTE — H&P (Signed)
Subjective:    Harold Hall is a 72 y.o. 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 The pain is described as none. Onset was several months ago. Symptoms have been unchanged since.   Review of Systems Pertinent items are noted in HPI.    Objective:   There were no vitals taken for this visit.  General:  alert, cooperative and appears stated age Skin:  normal Eyes: positive findings: eyelids/periorbital: ptosis bilaterally Mouth: MMM no lesions Lymph Nodes:  Cervical, supraclavicular, and axillary nodes normal. 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 Discussed the risk of surgery,  and the risks of general anesthetic including MI, CVA, sudden death or even reaction to anesthetic medications. The patient understands the risks, any and all questions were answered to the patient's satisfaction. 2. Follow up: 1 week.  Date of Surgery Update (To be completed by Attending Surgeon day of surgery.)

## 2018-08-30 ENCOUNTER — Other Ambulatory Visit: Payer: Self-pay

## 2018-08-30 ENCOUNTER — Other Ambulatory Visit: Payer: Self-pay | Admitting: Family Medicine

## 2018-08-30 ENCOUNTER — Encounter (HOSPITAL_COMMUNITY)
Admission: RE | Admit: 2018-08-30 | Discharge: 2018-08-30 | Disposition: A | Discharge status: Home / Self Care | Payer: Self-pay | Source: Ambulatory Visit | Attending: Internal Medicine | Admitting: Internal Medicine

## 2018-08-30 DIAGNOSIS — G2581 Restless legs syndrome: Secondary | ICD-10-CM | POA: Diagnosis not present

## 2018-08-30 DIAGNOSIS — N183 Chronic kidney disease, stage 3 (moderate): Secondary | ICD-10-CM | POA: Diagnosis not present

## 2018-08-30 DIAGNOSIS — I4891 Unspecified atrial fibrillation: Secondary | ICD-10-CM | POA: Diagnosis not present

## 2018-08-30 DIAGNOSIS — G4700 Insomnia, unspecified: Secondary | ICD-10-CM | POA: Diagnosis not present

## 2018-08-30 DIAGNOSIS — I10 Essential (primary) hypertension: Secondary | ICD-10-CM | POA: Diagnosis not present

## 2018-08-30 DIAGNOSIS — M109 Gout, unspecified: Secondary | ICD-10-CM | POA: Diagnosis not present

## 2018-08-30 DIAGNOSIS — E782 Mixed hyperlipidemia: Secondary | ICD-10-CM | POA: Diagnosis not present

## 2018-08-30 DIAGNOSIS — J449 Chronic obstructive pulmonary disease, unspecified: Secondary | ICD-10-CM

## 2018-08-30 DIAGNOSIS — F411 Generalized anxiety disorder: Secondary | ICD-10-CM | POA: Diagnosis not present

## 2018-08-30 DIAGNOSIS — K5900 Constipation, unspecified: Secondary | ICD-10-CM | POA: Diagnosis not present

## 2018-08-30 DIAGNOSIS — E669 Obesity, unspecified: Secondary | ICD-10-CM | POA: Diagnosis not present

## 2018-09-01 ENCOUNTER — Encounter (HOSPITAL_COMMUNITY)
Admission: RE | Admit: 2018-09-01 | Discharge: 2018-09-01 | Disposition: A | Discharge status: Home / Self Care | Payer: Self-pay | Source: Ambulatory Visit | Attending: Internal Medicine | Admitting: Internal Medicine

## 2018-09-02 ENCOUNTER — Ambulatory Visit
Admission: RE | Admit: 2018-09-02 | Discharge: 2018-09-02 | Disposition: A | Discharge status: Home / Self Care | Payer: Medicare Other | Source: Ambulatory Visit | Attending: Family Medicine | Admitting: Family Medicine

## 2018-09-02 DIAGNOSIS — J449 Chronic obstructive pulmonary disease, unspecified: Secondary | ICD-10-CM | POA: Diagnosis not present

## 2018-09-06 ENCOUNTER — Telehealth (HOSPITAL_COMMUNITY): Payer: Self-pay

## 2018-09-06 ENCOUNTER — Encounter (HOSPITAL_COMMUNITY): Payer: Self-pay

## 2018-09-08 ENCOUNTER — Encounter: Payer: Self-pay | Admitting: Nurse Practitioner

## 2018-09-08 ENCOUNTER — Telehealth (HOSPITAL_COMMUNITY): Payer: Self-pay | Admitting: Family Medicine

## 2018-09-08 ENCOUNTER — Ambulatory Visit (INDEPENDENT_AMBULATORY_CARE_PROVIDER_SITE_OTHER): Payer: Medicare Other | Admitting: Nurse Practitioner

## 2018-09-08 ENCOUNTER — Encounter (HOSPITAL_COMMUNITY): Payer: Self-pay

## 2018-09-08 VITALS — BP 144/78 | HR 55 | Ht 74.0 in | Wt 296.4 lb

## 2018-09-08 DIAGNOSIS — J441 Chronic obstructive pulmonary disease with (acute) exacerbation: Secondary | ICD-10-CM

## 2018-09-08 DIAGNOSIS — J9611 Chronic respiratory failure with hypoxia: Secondary | ICD-10-CM

## 2018-09-08 NOTE — Patient Instructions (Addendum)
Will order Levaquin restart azithromycin when finished Will order prednisone taper May take mucinex Follow up with Dr. Lake Bells as scheduled

## 2018-09-08 NOTE — Progress Notes (Signed)
@Patient  ID: Harold Hall, male    DOB: 1946-10-26, 72 y.o.   MRN: [id number]  Chief Complaint  Patient presents with  . Shortness of Breath    Referring provider: Alroy Dust, L.[name], MD  Synopsis: Former patient of Dr. Gwenette Greet with COPD. Dr. Gwenette Greet summarized his situation as follows: Stopped smoking 05/2011 Pulm rehab 2012, 2016, ongoing participation 2018 On exertional oxygen with portable concentrator> 3L rest, 5L with exertion +response to daliresp in past  HPI 72 year old male former smoker with COPD and chronic respiratory failure with hypoxia followed by Dr. Lake Bells.  Tests: CXR 04/26/18 - Small left pleural effusion. Persistent pulmonary scarring stable from previous exams.  PFT Results Latest Ref Rng & Units 04/22/2016  FVC-Pre L 3.58  FVC-Predicted Pre % 69  FVC-Post L 3.78  FVC-Predicted Post % 73  Pre FEV1/FVC % % 49  Post FEV1/FCV % % 47  FEV1-Pre L 1.77  FEV1-Predicted Pre % 46  DLCO UNC% % 34  DLCO COR %Predicted % 48  TLC L 6.45  TLC % Predicted % 82  RV % Predicted % 93    OV 09/09/18 - OV shortness of breath  Patient presents with shortness of breath and chest congestion. He has a lot of mucous. States that symptoms started about 3 days ago. States that symptoms are progressively worsening. He was recently started on azithromycin daily to help prevent exacerbations. He saw lung transplant team in October and wants to avoid any bad exacerbations. He denies any chest pain, edema, or fever.   No Known Allergies  Immunization History  Administered Date(s) Administered  . Influenza Split 08/13/2011, 08/21/2012, 08/17/2014, 08/24/2015  . Influenza Whole 08/12/2009, 08/09/2010  . Influenza, High Dose Seasonal PF 08/09/2013, 08/13/2016, 09/05/2017, 07/08/2018  . Pneumococcal Conjugate-13 08/17/2014  . Pneumococcal Polysaccharide-23 08/10/2007    Past Medical History:  Diagnosis Date  . Allergic rhinitis   . Arthritis   . Cancer [facility])    left renal  -solitary kidney  . COPD (chronic obstructive pulmonary disease) ([place])   . Erectile dysfunction   . Gout   . Hernia   . History of kidney cancer   . Hypercholesterolemia   . Hypertension   . RLS (restless legs syndrome)   . Sinus problem     Tobacco History: Social History   Tobacco Use  Smoking Status Former Smoker  . Packs/day: 1.50  . Years: 50.00  . Pack years: 75.00  . Types: Cigarettes  . Last attempt to quit: 06/04/2011  . Years since quitting: 7.2  Smokeless Tobacco Never Used   Counseling given: Yes   Outpatient Encounter Medications as of 09/08/2018  Medication Sig  . albuterol (PROAIR HFA) 108 (90 Base) MCG/ACT inhaler INHALE 2 PUFFS BY MOUTH INTO THE LUNGS EVERY 4 HOURS AS NEEDED FOR WHEEZING OR SHORTNESS OF BREATH  . allopurinol (ZYLOPRIM) 300 MG tablet Take 300 mg by mouth daily.  Marland Kitchen ALPRAZolam (XANAX) 0.5 MG tablet Take 1 tablet (0.5 mg total) by mouth 2 (two) times daily. (Patient taking differently: Take 0.5 mg by mouth at bedtime. )  . amLODipine (NORVASC) 10 MG tablet Take 10 mg by mouth daily.   Marland Kitchen atorvastatin (LIPITOR) 20 MG tablet Take 20 mg by mouth daily.  Marland Kitchen azelastine (ASTELIN) 0.1 % nasal spray Place 1 spray into both nostrils 2 (two) times daily. Once daily  . azithromycin (ZITHROMAX) 250 MG tablet Take 1 tablet (250 mg) daily with food.  . budesonide-formoterol (SYMBICORT) 160-4.5 MCG/ACT inhaler INHALE 2  PUFFS BY MOUTH INTO THE LUNGS TWICE A DAY  . doxycycline (VIBRA-TABS) 100 MG tablet Take 1 tablet (100 mg total) by mouth 2 (two) times daily.  Marland Kitchen ELIQUIS 5 MG TABS tablet Take 5 mg by mouth 2 (two) times daily.  . fish oil-omega-3 fatty acids 1000 MG capsule Take 1 capsule by mouth 3 (three) times daily.    . flecainide (TAMBOCOR) 100 MG tablet Take 100 mg by mouth 2 (two) times daily.  . furosemide (LASIX) 40 MG tablet Take 40 mg by mouth daily.   . Linaclotide (LINZESS) 145 MCG CAPS capsule Take 145 mcg by mouth daily.  Marland Kitchen  losartan-hydrochlorothiazide (HYZAAR) 100-25 MG tablet   . metoprolol succinate (TOPROL-XL) 50 MG 24 hr tablet Take 50 mg by mouth daily. Take with or immediately following a meal.  . montelukast (SINGULAIR) 10 MG tablet TAKE 1 TABLET(10 MG) BY MOUTH AT BEDTIME  . Multiple Vitamin (MULITIVITAMIN WITH MINERALS) TABS Take 1 tablet by mouth daily.  Marland Kitchen neomycin-polymyxin b-dexamethasone (MAXITROL) 3.5-10000-0.1 OINT Only as needed  . nitroGLYCERIN (NITROSTAT) 0.4 MG SL tablet Place 0.4 mg under the tongue every 5 (five) minutes as needed for chest pain.  . potassium chloride (KLOR-CON) 20 MEQ packet Take 20 mEq by mouth daily.  . predniSONE (DELTASONE) 10 MG tablet 4 tabs for 2 days, then 3 tabs for 2 days, 2 tabs for 2 days, then 1 tab for 2 days, then stop  . Respiratory Therapy Supplies (FLUTTER) DEVI Use as directed  . roflumilast (DALIRESP) 500 MCG TABS tablet Take 1 tablet (500 mcg total) by mouth daily.  Marland Kitchen rOPINIRole (REQUIP) 0.5 MG tablet Take 1 tablet (0.5 mg total) by mouth daily.  . temazepam (RESTORIL) 15 MG capsule Take 15 mg by mouth at bedtime as needed for sleep.   . Tiotropium Bromide Monohydrate (SPIRIVA RESPIMAT) 2.5 MCG/ACT AERS Inhale 2 puffs into the lungs daily.  . traMADol (ULTRAM) 50 MG tablet Take 50 mg by mouth 3 (three) times daily as needed for moderate pain.   . vitamin C (ASCORBIC ACID) 500 MG tablet Take 500 mg by mouth daily.   No facility-administered encounter medications on file as of 09/08/2018.      Review of Systems  Review of Systems  Constitutional: Negative.  Negative for chills and fever.  HENT: Positive for congestion and postnasal drip. Negative for sinus pressure and sinus pain.   Respiratory: Positive for cough, shortness of breath and wheezing.   Cardiovascular: Negative.  Negative for chest pain, palpitations and leg swelling.  Gastrointestinal: Negative.   Allergic/Immunologic: Negative.   Neurological: Negative.   Psychiatric/Behavioral:  Negative.        Physical Exam  BP (!) 144/78 (BP Location: Left Arm, Patient Position: Sitting, Cuff Size: Normal)   Pulse (!) 55   Ht 6\' 2"  (1.88 m)   Wt 296 lb 6.4 oz (134.4 kg)   SpO2 95% Comment: 6L of O2  BMI 38.06 kg/m   Wt Readings from Last 5 Encounters:  09/08/18 296 lb 6.4 oz (134.4 kg)  08/17/18 299 lb (135.6 kg)  07/08/18 298 lb 6.4 oz (135.4 kg)  05/10/18 297 lb (134.7 kg)  04/26/18 299 lb (135.6 kg)     Physical Exam  Constitutional: He is oriented to person, place, and time. He appears well-developed and well-nourished. No distress.  Cardiovascular: Normal rate and regular rhythm.  Pulmonary/Chest: Effort normal. He has wheezes.  Musculoskeletal:       Right lower leg: He exhibits no edema.  Neurological: He is alert and oriented to person, place, and time.  Skin: Skin is warm and dry.  Psychiatric: He has a normal mood and affect.  Nursing note and vitals reviewed.   Imaging: US Aorta Complete  Result Date: 09/02/2018 CLINICAL DATA:  Former smoker. Hyperlipidemia, hypertension. Preoperative examination prior to lung surgery. EXAM: ULTRASOUND OF ABDOMINAL AORTA TECHNIQUE: Ultrasound examination of the abdominal aorta was performed to evaluate for abdominal aortic aneurysm. COMPARISON:  Abdominal and pelvic CT scan of February 26, 2011 FINDINGS: Abdominal aortic measurements as follows: Proximal:  2.4 cm AP x 1.9 cm transversely Mid:  1.7 cm AP x 2.0 cm transversely Distal:  1.8 cm AP x 1.7 cm transversely The iliac arteries were obscured by bowel gas the patient's body habitus. IMPRESSION: No abdominal aortic aneurysm Electronically Signed   By: David  Martinique M.D.   On: 09/02/2018 14:32     Assessment & Plan:   COPD exacerbation Patient Instructions  Will order Levaquin restart azithromycin when finished Will order prednisone taper May take mucinex Follow up with Dr. Lake Bells as scheduled    Chronic respiratory failure with hypoxia ([place]) Continue O2  3-5 L with exertion     Fenton Foy, NP 09/09/2018

## 2018-09-09 ENCOUNTER — Encounter: Payer: Self-pay | Admitting: Nurse Practitioner

## 2018-09-09 ENCOUNTER — Telehealth: Payer: Self-pay | Admitting: Nurse Practitioner

## 2018-09-09 DIAGNOSIS — J441 Chronic obstructive pulmonary disease with (acute) exacerbation: Secondary | ICD-10-CM

## 2018-09-09 MED ORDER — LEVOFLOXACIN 500 MG PO TABS: 500.0000 mg | ORAL_TABLET | Freq: Every day | ORAL | 0 refills | Status: DC

## 2018-09-09 MED ORDER — PREDNISONE 10 MG PO TABS: ORAL_TABLET | ORAL | 0 refills | Status: DC

## 2018-09-09 NOTE — Addendum Note (Signed)
Addended by: Fenton Foy on: 09/09/2018 11:30 AM   Modules accepted: Orders

## 2018-09-09 NOTE — Progress Notes (Signed)
Reviewed, agree 

## 2018-09-09 NOTE — Telephone Encounter (Signed)
Called and spoke to patient, prescription was sent to wrong pharmacy, prescription sent [facility] [address] at patient request. Called [facility] [address] and cancelled previous prescription. Made patient aware new prescription has been sent to specified pharmacy. Voiced understanding. Nothing further is needed at this time.

## 2018-09-09 NOTE — Assessment & Plan Note (Signed)
Patient Instructions  Will order Levaquin restart azithromycin when finished Will order prednisone taper May take mucinex Follow up with Dr. Lake Bells as scheduled

## 2018-09-09 NOTE — Assessment & Plan Note (Signed)
Continue O2 3-5 L with exertion

## 2018-09-13 ENCOUNTER — Encounter (HOSPITAL_COMMUNITY)
Admission: RE | Admit: 2018-09-13 | Discharge: 2018-09-13 | Disposition: A | Discharge status: Home / Self Care | Payer: Self-pay | Source: Ambulatory Visit | Attending: Internal Medicine | Admitting: Internal Medicine

## 2018-09-13 DIAGNOSIS — J9611 Chronic respiratory failure with hypoxia: Secondary | ICD-10-CM | POA: Insufficient documentation

## 2018-09-13 DIAGNOSIS — I10 Essential (primary) hypertension: Secondary | ICD-10-CM | POA: Insufficient documentation

## 2018-09-13 DIAGNOSIS — J439 Emphysema, unspecified: Secondary | ICD-10-CM | POA: Insufficient documentation

## 2018-09-15 ENCOUNTER — Encounter (HOSPITAL_COMMUNITY)
Admission: RE | Admit: 2018-09-15 | Discharge: 2018-09-15 | Disposition: A | Discharge status: Home / Self Care | Payer: Self-pay | Source: Ambulatory Visit | Attending: Internal Medicine | Admitting: Internal Medicine

## 2018-09-15 ENCOUNTER — Ambulatory Visit: Admit: 2018-09-15 | Payer: Medicare Other | Admitting: Oculoplastics Ophthalmology

## 2018-09-15 SURGERY — BLEPHAROPLASTY
Anesthesia: General | Laterality: Bilateral

## 2018-09-20 ENCOUNTER — Encounter (HOSPITAL_COMMUNITY): Payer: Self-pay

## 2018-09-21 DIAGNOSIS — Z87891 Personal history of nicotine dependence: Secondary | ICD-10-CM | POA: Diagnosis not present

## 2018-09-21 DIAGNOSIS — I10 Essential (primary) hypertension: Secondary | ICD-10-CM | POA: Diagnosis not present

## 2018-09-21 DIAGNOSIS — I2510 Atherosclerotic heart disease of native coronary artery without angina pectoris: Secondary | ICD-10-CM | POA: Diagnosis not present

## 2018-09-21 DIAGNOSIS — J449 Chronic obstructive pulmonary disease, unspecified: Secondary | ICD-10-CM | POA: Diagnosis not present

## 2018-09-21 DIAGNOSIS — E785 Hyperlipidemia, unspecified: Secondary | ICD-10-CM | POA: Diagnosis not present

## 2018-09-21 DIAGNOSIS — I059 Rheumatic mitral valve disease, unspecified: Secondary | ICD-10-CM | POA: Diagnosis not present

## 2018-09-21 DIAGNOSIS — I4891 Unspecified atrial fibrillation: Secondary | ICD-10-CM | POA: Diagnosis not present

## 2018-09-21 DIAGNOSIS — Z01818 Encounter for other preprocedural examination: Secondary | ICD-10-CM | POA: Diagnosis not present

## 2018-09-21 DIAGNOSIS — Z7951 Long term (current) use of inhaled steroids: Secondary | ICD-10-CM | POA: Diagnosis not present

## 2018-09-22 ENCOUNTER — Encounter (HOSPITAL_COMMUNITY): Payer: Self-pay

## 2018-09-27 ENCOUNTER — Encounter (HOSPITAL_COMMUNITY)
Admission: RE | Admit: 2018-09-27 | Discharge: 2018-09-27 | Disposition: A | Discharge status: Home / Self Care | Payer: Self-pay | Source: Ambulatory Visit | Attending: Internal Medicine | Admitting: Internal Medicine

## 2018-09-29 ENCOUNTER — Encounter (HOSPITAL_COMMUNITY)
Admission: RE | Admit: 2018-09-29 | Discharge: 2018-09-29 | Disposition: A | Discharge status: Home / Self Care | Payer: Self-pay | Source: Ambulatory Visit | Attending: Internal Medicine | Admitting: Internal Medicine

## 2018-10-02 ENCOUNTER — Ambulatory Visit (HOSPITAL_BASED_OUTPATIENT_CLINIC_OR_DEPARTMENT_OTHER): Payer: Medicare Other | Attending: Pulmonary Disease | Admitting: Pulmonary Disease

## 2018-10-02 VITALS — Ht 74.0 in | Wt 295.0 lb

## 2018-10-02 DIAGNOSIS — R5383 Other fatigue: Secondary | ICD-10-CM | POA: Diagnosis not present

## 2018-10-02 DIAGNOSIS — I10 Essential (primary) hypertension: Secondary | ICD-10-CM | POA: Diagnosis not present

## 2018-10-02 DIAGNOSIS — J449 Chronic obstructive pulmonary disease, unspecified: Secondary | ICD-10-CM | POA: Insufficient documentation

## 2018-10-02 DIAGNOSIS — G4730 Sleep apnea, unspecified: Secondary | ICD-10-CM | POA: Diagnosis not present

## 2018-10-02 DIAGNOSIS — E669 Obesity, unspecified: Secondary | ICD-10-CM | POA: Insufficient documentation

## 2018-10-02 DIAGNOSIS — J9611 Chronic respiratory failure with hypoxia: Secondary | ICD-10-CM

## 2018-10-04 ENCOUNTER — Encounter (HOSPITAL_COMMUNITY)
Admission: RE | Admit: 2018-10-04 | Discharge: 2018-10-04 | Disposition: A | Discharge status: Home / Self Care | Payer: Self-pay | Source: Ambulatory Visit | Attending: Internal Medicine | Admitting: Internal Medicine

## 2018-10-04 ENCOUNTER — Telehealth: Payer: Self-pay | Admitting: Pulmonary Disease

## 2018-10-04 NOTE — Telephone Encounter (Signed)
Left message for patient to call back  

## 2018-10-07 NOTE — Telephone Encounter (Signed)
Attempted to call pt but no answer. Left message for pt to return call. 

## 2018-10-10 NOTE — Telephone Encounter (Signed)
Attempted to call pt but unable to reach him. Left message for pt to return call. Pt does have an appt scheduled 10/13/18. If pt has not called back before that OV, this can be handled at his OV.

## 2018-10-11 ENCOUNTER — Encounter (HOSPITAL_COMMUNITY): Payer: Self-pay

## 2018-10-11 DIAGNOSIS — J439 Emphysema, unspecified: Secondary | ICD-10-CM | POA: Insufficient documentation

## 2018-10-11 DIAGNOSIS — J9611 Chronic respiratory failure with hypoxia: Secondary | ICD-10-CM | POA: Insufficient documentation

## 2018-10-11 DIAGNOSIS — I10 Essential (primary) hypertension: Secondary | ICD-10-CM | POA: Insufficient documentation

## 2018-10-11 NOTE — Telephone Encounter (Signed)
Left message stating this matter would be discussed at his OV with McQuaid on 10/13/18 at 11:30am.

## 2018-10-12 DIAGNOSIS — E669 Obesity, unspecified: Secondary | ICD-10-CM | POA: Diagnosis not present

## 2018-10-12 DIAGNOSIS — J9611 Chronic respiratory failure with hypoxia: Secondary | ICD-10-CM | POA: Diagnosis not present

## 2018-10-12 DIAGNOSIS — J449 Chronic obstructive pulmonary disease, unspecified: Secondary | ICD-10-CM | POA: Diagnosis not present

## 2018-10-12 DIAGNOSIS — R5383 Other fatigue: Secondary | ICD-10-CM

## 2018-10-12 DIAGNOSIS — I10 Essential (primary) hypertension: Secondary | ICD-10-CM

## 2018-10-12 NOTE — Procedures (Signed)
    Patient Name: Harold Hall, Lamagna Date: 10/02/2018   Gender: Male  D.O.B: Mar 26, 1946  Age (years): 72  Referring Provider: Simonne Maffucci  Height (inches): 29  Interpreting Physician: Chesley Mires MD, ABSM  Weight (lbs): 295  RPSGT: Laren Everts  BMI: 38  MRN: [id number]  Neck Size: 21.00  <br> <br>  CLINICAL INFORMATION  Sleep Study Type: NPSG Indication for sleep study: COPD, Fatigue, Hypertension, Obesity Epworth Sleepiness Score: 5 SLEEP STUDY TECHNIQUE  As per the AASM Manual for the Scoring of Sleep and Associated Events v2.3 (April 2016) with a hypopnea requiring 4% desaturations. The channels recorded and monitored were frontal, central and occipital EEG, electrooculogram (EOG), submentalis EMG (chin), nasal and oral airflow, thoracic and abdominal wall motion, anterior tibialis EMG, snore microphone, electrocardiogram, and pulse oximetry. MEDICATIONS  Medications self-administered by patient taken the night of the study : [place]  The study was initiated at 10:06:59 PM and ended at 5:06:49 AM. Sleep onset time was 20.7 minutes and the sleep efficiency was 62.4%%. The total sleep time was 262 minutes. Stage REM latency was 81.5 minutes. The patient spent 17.7%% of the night in stage N1 sleep, 62.8%% in stage N2 sleep, 0.0%% in stage N3 and 19.5% in REM. Alpha intrusion was absent. Supine sleep was 100.00%. RESPIRATORY PARAMETERS  The overall apnea/hypopnea index (AHI) was 0.5 per hour. There were 0 total apneas, including 0 obstructive, 0 central and 0 mixed apneas. There were 2 hypopneas and 81 RERAs. The respiratory disturbance index (RDI) was 19 per hour. The AHI during Stage REM sleep was 0.0 per hour. AHI while supine was 0.5 per hour. The mean oxygen saturation was 90.9%. The minimum SpO2 during sleep was 88.0%. He used 6 liters oxygen during this study. soft snoring was noted during this study. CARDIAC DATA  The 2 lead EKG  demonstrated sinus rhythm. The mean heart rate was 54.6 beats per minute. Other EKG findings include: PVCs.  LEG MOVEMENT DATA  The total PLMS were 0 with a resulting PLMS index of 0.0. Associated arousal with leg movement index was 6.2 . IMPRESSIONS  - Based on his respiratory disturbance index he has moderate sleep apnea with an RDI of 19 and SpO2 low of 88%.  - He used 6 liters supplemental oxygen during this study and slept in a recliner. DIAGNOSIS  - Sleep apnea, unspecified (ICD-10: G47.30). RECOMMENDATIONS  - Additional therapies could include weight loss, CPAP, oral appliance, or surgical assessment.  - Continue 6 liters supplemental oxygen at night. [Electronically signed] 10/12/2018 08:45 AM Chesley Mires MD, ABSM  Diplomate, American Board of Sleep Medicine  NPI: [id number]

## 2018-10-13 ENCOUNTER — Encounter (HOSPITAL_COMMUNITY): Payer: Self-pay

## 2018-10-13 ENCOUNTER — Ambulatory Visit (INDEPENDENT_AMBULATORY_CARE_PROVIDER_SITE_OTHER): Payer: Medicare Other | Admitting: Pulmonary Disease

## 2018-10-13 ENCOUNTER — Encounter: Payer: Self-pay | Admitting: Pulmonary Disease

## 2018-10-13 VITALS — BP 124/58 | HR 68 | Ht 74.21 in | Wt 292.0 lb

## 2018-10-13 DIAGNOSIS — J9611 Chronic respiratory failure with hypoxia: Secondary | ICD-10-CM | POA: Diagnosis not present

## 2018-10-13 DIAGNOSIS — I4891 Unspecified atrial fibrillation: Secondary | ICD-10-CM | POA: Diagnosis not present

## 2018-10-13 DIAGNOSIS — J432 Centrilobular emphysema: Secondary | ICD-10-CM | POA: Diagnosis not present

## 2018-10-13 DIAGNOSIS — J441 Chronic obstructive pulmonary disease with (acute) exacerbation: Secondary | ICD-10-CM | POA: Diagnosis not present

## 2018-10-13 DIAGNOSIS — J301 Allergic rhinitis due to pollen: Secondary | ICD-10-CM

## 2018-10-13 MED ORDER — PREDNISONE 20 MG PO TABS: 20.0000 mg | ORAL_TABLET | Freq: Every day | ORAL | 0 refills | Status: AC

## 2018-10-13 NOTE — Progress Notes (Signed)
Subjective:    Patient ID: Harold Hall, male    DOB: Mar 19, 1946, 72 y.o.   MRN: [id number]  Synopsis: Former patient of Dr. Gwenette Greet with COPD. Dr. Gwenette Greet summarized his situation as follows: Stopped smoking 05/2011 Pulm rehab 2012, 2016, ongoing participation 2018 On exertional oxygen with portable concentrator> 4L rest, 6L with exertion +response to daliresp in past  HPI Chief Complaint  Patient presents with  . Follow-up    Pt states that he is experiecing SOB, productive cough.   He caught a cold recently and he has been using more oxygen. > he has more chest congestion > he has been on mucinex and dayquil > No fever, no chills > mucus is yellow to clear, thick  He is still using and benefitting from his oxygen, he uses 4L at rest, 6L with exertion.  He just learned that Spiriva and Symbicort won't be covered.     Past Medical History:  Diagnosis Date  . Allergic rhinitis   . Arthritis   . Cancer [facility])    left renal -solitary kidney  . COPD (chronic obstructive pulmonary disease) ([place])   . Erectile dysfunction   . Gout   . Hernia   . History of kidney cancer   . Hypercholesterolemia   . Hypertension   . RLS (restless legs syndrome)   . Sinus problem       Review of Systems  Constitutional: Negative for chills, fatigue and fever.  HENT: Negative for postnasal drip, rhinorrhea and sinus pressure.   Respiratory: Positive for shortness of breath. Negative for cough and wheezing.   Cardiovascular: Positive for leg swelling. Negative for chest pain and palpitations.       Objective:   Physical Exam Vitals:   10/13/18 1128 10/13/18 1131  BP:  (!) 124/58  Pulse: 68 68  SpO2: 90% 90%  Weight:  292 lb (132.5 kg)  Height:  6' 2.21" (1.885 m)   6L   Gen: obese, chronically ill appearing HENT: OP clear, TM's clear, neck supple PULM: Poor air movement, no wheezing, normal percussion CV: Irreg irreg, no mgr, trace edema GI: BS+, soft,  nontender Derm: no cyanosis or rash Psyche: normal mood and affect     Imaging: December 2017 chest CT images personal reviewed showing significant emphysema with an upper lobe predominance, no clear interstitial lung disease.  Blood: 09/2016 IgE 4 08/15/2018 Hgb 16.9 mg/dL  08/15/2018 Radiopharmaceutical nuclear GFR 134.44 mL  Echo: 08/15/2018 LVEF > 55%, mod LVH, dilated left atrial, normal RV, no shunt  PFT: [place] 2007:  FEV1 2.10 (46%), ratio 47 PFT 2017 Ratio 47%, FEV1 1.76L (46% pred), FVC 3.78L, TLC 6.45L (82% pred), DLCO 13.18  CBC    Component Value Date/Time   WBC 8.4 10/06/2016 1306   RBC 5.74 10/06/2016 1306   HGB 16.8 10/06/2016 1306   HCT 49.6 10/06/2016 1306   PLT 190.0 10/06/2016 1306   MCV 86.3 10/06/2016 1306   MCH 28.8 06/10/2011 0904   MCHC 33.8 10/06/2016 1306   RDW 15.6 (H) 10/06/2016 1306   LYMPHSABS 1.1 06/10/2011 0904   MONOABS 1.0 06/10/2011 0904   EOSABS 0.3 06/10/2011 0904   BASOSABS 0.0 06/10/2011 0904   Records from the [facility] transplant evaluation committee reviewed: Because of obesity, age, heart condition, and gastroesophageal reflux disease his candidacy for lung transplant was denied.     Assessment & Plan:   COPD exacerbation ([place])  Chronic respiratory failure with hypoxia ([facility])  Centrilobular emphysema ([facility])  Atrial  fibrillation, unspecified type ([place])  Allergic rhinitis due to pollen, unspecified seasonality  Discussion: Unfortunately Brett is having a mild exacerbation of his COPD.  This started with a viral illness a few days ago.  He says that he was a little disappointed that he was unable to get a lung transplant but he was nervous about the possibility of it as well.  He continues to use and benefit from his oxygen and other treatments though there will be a change to his insurance coverage on January 1 so we will need to accommodate for that by changing pharmacies.  His sleep study did not show sleep apnea though he  does have chronic respiratory failure with hypercapnia due to his severe COPD and a high RDI on his sleep study.  I think he probably would benefit from noninvasive mechanical ventilation but he is not interested in trying that today.  Plan: COPD with acute exacerbation: Take prednisone 20 mg daily x5 days Continue taking Symbicort and Spiriva On January 1 call us so that we can change pharmacies Continue Daliresp Continue daily azithromycin Call me if no improvement  Chronic respiratory failure with hypoxemia: Keep using 4 L of oxygen at rest, 6 L with exertion  Chronic respiratory failure with hypercarbia: A noninvasive mechanical ventilator may help you feel better, if you would like to try 1 let me know  We will see you back in 2 to 3 months or sooner if needed  Greater than 50% of this 30-minute visit spent face-to-face   Current Outpatient Medications:  .  albuterol (PROAIR HFA) 108 (90 Base) MCG/ACT inhaler, INHALE 2 PUFFS BY MOUTH INTO THE LUNGS EVERY 4 HOURS AS NEEDED FOR WHEEZING OR SHORTNESS OF BREATH, Disp: 3 Inhaler, Rfl: 3 .  allopurinol (ZYLOPRIM) 300 MG tablet, Take 300 mg by mouth daily., Disp: , Rfl:  .  ALPRAZolam (XANAX) 0.5 MG tablet, Take 1 tablet (0.5 mg total) by mouth 2 (two) times daily. (Patient taking differently: Take 0.5 mg by mouth at bedtime. ), Disp: 60 tablet, Rfl: 0 .  amLODipine (NORVASC) 10 MG tablet, Take 10 mg by mouth daily. , Disp: , Rfl:  .  atorvastatin (LIPITOR) 20 MG tablet, Take 20 mg by mouth daily., Disp: , Rfl:  .  azelastine (ASTELIN) 0.1 % nasal spray, Place 1 spray into both nostrils 2 (two) times daily. Once daily, Disp: , Rfl: 2 .  azithromycin (ZITHROMAX) 250 MG tablet, Take 1 tablet (250 mg) daily with food., Disp: 90 tablet, Rfl: 2 .  budesonide-formoterol (SYMBICORT) 160-4.5 MCG/ACT inhaler, INHALE 2 PUFFS BY MOUTH INTO THE LUNGS TWICE A DAY, Disp: 3 Inhaler, Rfl: 3 .  doxycycline (VIBRA-TABS) 100 MG tablet, Take 1 tablet (100  mg total) by mouth 2 (two) times daily., Disp: 14 tablet, Rfl: 0 .  ELIQUIS 5 MG TABS tablet, Take 5 mg by mouth 2 (two) times daily., Disp: , Rfl: 0 .  fish oil-omega-3 fatty acids 1000 MG capsule, Take 1 capsule by mouth 3 (three) times daily.  , Disp: , Rfl:  .  flecainide (TAMBOCOR) 100 MG tablet, Take 100 mg by mouth 2 (two) times daily., Disp: , Rfl: 0 .  furosemide (LASIX) 40 MG tablet, Take 40 mg by mouth daily. , Disp: , Rfl:  .  Linaclotide (LINZESS) 145 MCG CAPS capsule, Take 145 mcg by mouth daily., Disp: , Rfl:  .  losartan-hydrochlorothiazide (HYZAAR) 100-25 MG tablet, , Disp: , Rfl:  .  metoprolol succinate (TOPROL-XL) 50 MG 24 hr  tablet, Take 50 mg by mouth daily. Take with or immediately following a meal., Disp: , Rfl:  .  montelukast (SINGULAIR) 10 MG tablet, TAKE 1 TABLET(10 MG) BY MOUTH AT BEDTIME, Disp: 90 tablet, Rfl: 0 .  Multiple Vitamin (MULITIVITAMIN WITH MINERALS) TABS, Take 1 tablet by mouth daily., Disp: , Rfl:  .  neomycin-polymyxin b-dexamethasone (MAXITROL) 3.5-10000-0.1 OINT, Only as needed, Disp: , Rfl: 1 .  nitroGLYCERIN (NITROSTAT) 0.4 MG SL tablet, Place 0.4 mg under the tongue every 5 (five) minutes as needed for chest pain., Disp: , Rfl:  .  potassium chloride (KLOR-CON) 20 MEQ packet, Take 20 mEq by mouth daily., Disp: 3 tablet, Rfl: 0 .  Respiratory Therapy Supplies (FLUTTER) DEVI, Use as directed, Disp: 1 each, Rfl: 0 .  roflumilast (DALIRESP) 500 MCG TABS tablet, Take 1 tablet (500 mcg total) by mouth daily., Disp: 90 tablet, Rfl: 3 .  rOPINIRole (REQUIP) 0.5 MG tablet, Take 1 tablet (0.5 mg total) by mouth daily., Disp: 30 tablet, Rfl: 0 .  temazepam (RESTORIL) 15 MG capsule, Take 15 mg by mouth at bedtime as needed for sleep. , Disp: , Rfl:  .  Tiotropium Bromide Monohydrate (SPIRIVA RESPIMAT) 2.5 MCG/ACT AERS, Inhale 2 puffs into the lungs daily., Disp: 3 Inhaler, Rfl: 3 .  traMADol (ULTRAM) 50 MG tablet, Take 50 mg by mouth 3 (three) times daily as  needed for moderate pain. , Disp: , Rfl: 0 .  vitamin C (ASCORBIC ACID) 500 MG tablet, Take 500 mg by mouth daily., Disp: , Rfl:  .  predniSONE (DELTASONE) 20 MG tablet, Take 1 tablet (20 mg total) by mouth daily with breakfast for 5 days., Disp: 5 tablet, Rfl: 0

## 2018-10-13 NOTE — Patient Instructions (Signed)
COPD with acute exacerbation: Take prednisone 20 mg daily x5 days Continue taking Symbicort and Spiriva On January 1 call us so that we can change pharmacies Continue Daliresp Continue daily azithromycin Call me if no improvement  Chronic respiratory failure with hypoxemia: Keep using 4 L of oxygen at rest, 6 L with exertion  Chronic respiratory failure with hypercarbia: A noninvasive mechanical ventilator may help you feel better, if you would like to try 1 let me know  We will see you back in 2 to 3 months or sooner if needed

## 2018-10-17 ENCOUNTER — Telehealth: Payer: Self-pay | Admitting: Pulmonary Disease

## 2018-10-17 MED ORDER — DOXYCYCLINE HYCLATE 100 MG PO TABS: 100.0000 mg | ORAL_TABLET | Freq: Two times a day (BID) | ORAL | 0 refills | Status: DC

## 2018-10-17 NOTE — Telephone Encounter (Signed)
Dr. Lake Bells please advise, thanks!

## 2018-10-17 NOTE — Telephone Encounter (Signed)
Doxycycline 100mg  po bid x 5 days rec saline rinses with Milta Deiters Med rinse

## 2018-10-17 NOTE — Telephone Encounter (Signed)
Patient is having difficulty hearing due to head congestion. Patient's wife reported that Dr. Lake Bells had told them at [facility] if patient was feeling better by 10/17/18 to let him know. Patient has also been having a productive cough, thick/yellow.

## 2018-10-17 NOTE — Telephone Encounter (Signed)
Called and spoke with pt's wife [name] letting her know that BQ said to send in abx doxy to see if it would help with his symptoms. Also stated to her that pt could try saline rinses with Milta Deiters Med rinse.  Melissa expressed understanding. Verified pt's preferred pharmacy and sent abx in for pt. Nothing further needed.

## 2018-10-18 ENCOUNTER — Encounter (HOSPITAL_COMMUNITY): Payer: Self-pay

## 2018-10-20 ENCOUNTER — Encounter (HOSPITAL_COMMUNITY): Payer: Self-pay

## 2018-10-25 ENCOUNTER — Encounter (HOSPITAL_COMMUNITY): Payer: Self-pay

## 2018-10-26 DIAGNOSIS — J209 Acute bronchitis, unspecified: Secondary | ICD-10-CM | POA: Diagnosis not present

## 2018-10-26 DIAGNOSIS — J449 Chronic obstructive pulmonary disease, unspecified: Secondary | ICD-10-CM | POA: Diagnosis not present

## 2018-10-27 ENCOUNTER — Encounter (HOSPITAL_COMMUNITY): Payer: Self-pay

## 2018-11-01 ENCOUNTER — Encounter (HOSPITAL_COMMUNITY): Payer: Self-pay

## 2018-11-03 ENCOUNTER — Encounter (HOSPITAL_COMMUNITY): Payer: Self-pay

## 2018-11-08 ENCOUNTER — Encounter (HOSPITAL_COMMUNITY)
Admission: RE | Admit: 2018-11-08 | Discharge: 2018-11-08 | Disposition: A | Discharge status: Home / Self Care | Payer: Self-pay | Source: Ambulatory Visit | Attending: Internal Medicine | Admitting: Internal Medicine

## 2018-11-10 ENCOUNTER — Encounter (HOSPITAL_COMMUNITY)
Admission: RE | Admit: 2018-11-10 | Discharge: 2018-11-10 | Disposition: A | Discharge status: Home / Self Care | Payer: Self-pay | Source: Ambulatory Visit | Attending: Internal Medicine | Admitting: Internal Medicine

## 2018-11-10 DIAGNOSIS — I10 Essential (primary) hypertension: Secondary | ICD-10-CM | POA: Insufficient documentation

## 2018-11-10 DIAGNOSIS — J439 Emphysema, unspecified: Secondary | ICD-10-CM | POA: Insufficient documentation

## 2018-11-10 DIAGNOSIS — J9611 Chronic respiratory failure with hypoxia: Secondary | ICD-10-CM | POA: Insufficient documentation

## 2018-11-15 ENCOUNTER — Encounter (HOSPITAL_COMMUNITY)
Admission: RE | Admit: 2018-11-15 | Discharge: 2018-11-15 | Disposition: A | Discharge status: Home / Self Care | Payer: Self-pay | Source: Ambulatory Visit | Attending: Pulmonary Disease | Admitting: Pulmonary Disease

## 2018-11-17 ENCOUNTER — Encounter (HOSPITAL_COMMUNITY)
Admission: RE | Admit: 2018-11-17 | Discharge: 2018-11-17 | Disposition: A | Discharge status: Home / Self Care | Payer: Self-pay | Source: Ambulatory Visit | Attending: Pulmonary Disease | Admitting: Pulmonary Disease

## 2018-11-22 ENCOUNTER — Encounter (HOSPITAL_COMMUNITY)
Admission: RE | Admit: 2018-11-22 | Discharge: 2018-11-22 | Disposition: A | Discharge status: Home / Self Care | Payer: Self-pay | Source: Ambulatory Visit | Attending: Pulmonary Disease | Admitting: Pulmonary Disease

## 2018-11-23 ENCOUNTER — Other Ambulatory Visit: Payer: Self-pay

## 2018-11-23 MED ORDER — MONTELUKAST SODIUM 10 MG PO TABS: ORAL_TABLET | ORAL | 2 refills | Status: DC

## 2018-11-24 ENCOUNTER — Encounter (HOSPITAL_COMMUNITY): Payer: Self-pay

## 2018-11-24 ENCOUNTER — Telehealth (HOSPITAL_COMMUNITY): Payer: Self-pay | Admitting: Family Medicine

## 2018-11-24 DIAGNOSIS — R0789 Other chest pain: Secondary | ICD-10-CM | POA: Diagnosis not present

## 2018-11-29 ENCOUNTER — Encounter (HOSPITAL_COMMUNITY)
Admission: RE | Admit: 2018-11-29 | Discharge: 2018-11-29 | Disposition: A | Discharge status: Home / Self Care | Payer: Self-pay | Source: Ambulatory Visit | Attending: Pulmonary Disease | Admitting: Pulmonary Disease

## 2018-12-01 ENCOUNTER — Encounter (HOSPITAL_COMMUNITY)
Admission: RE | Admit: 2018-12-01 | Discharge: 2018-12-01 | Disposition: A | Discharge status: Home / Self Care | Payer: Self-pay | Source: Ambulatory Visit | Attending: Pulmonary Disease | Admitting: Pulmonary Disease

## 2018-12-02 ENCOUNTER — Telehealth: Payer: Self-pay | Admitting: Pulmonary Disease

## 2018-12-02 MED ORDER — TIOTROPIUM BROMIDE MONOHYDRATE 2.5 MCG/ACT IN AERS: 2.0000 | INHALATION_SPRAY | Freq: Every day | RESPIRATORY_TRACT | 3 refills | Status: DC

## 2018-12-02 MED ORDER — ROFLUMILAST 500 MCG PO TABS: 500.0000 ug | ORAL_TABLET | Freq: Every day | ORAL | 3 refills | Status: DC

## 2018-12-02 NOTE — Telephone Encounter (Signed)
Rxs for Daliresp and Spiriva have been sent in. Nothing further was needed.

## 2018-12-05 ENCOUNTER — Telehealth: Payer: Self-pay | Admitting: Pulmonary Disease

## 2018-12-05 NOTE — Telephone Encounter (Signed)
ATC pt no answer and phone continued to hang up on me-therefore no VM could be left.

## 2018-12-06 ENCOUNTER — Encounter (HOSPITAL_COMMUNITY)
Admission: RE | Admit: 2018-12-06 | Discharge: 2018-12-06 | Disposition: A | Discharge status: Home / Self Care | Payer: Self-pay | Source: Ambulatory Visit | Attending: Pulmonary Disease | Admitting: Pulmonary Disease

## 2018-12-06 NOTE — Telephone Encounter (Signed)
Spoke with pt. Since leaving [place] a message yesterday, he has changed his mind and would like to leave his prescriptions as they are. Nothing further was needed at this time.

## 2018-12-08 ENCOUNTER — Encounter (HOSPITAL_COMMUNITY)
Admission: RE | Admit: 2018-12-08 | Discharge: 2018-12-08 | Disposition: A | Discharge status: Home / Self Care | Payer: Self-pay | Source: Ambulatory Visit | Attending: Pulmonary Disease | Admitting: Pulmonary Disease

## 2018-12-13 ENCOUNTER — Encounter (HOSPITAL_COMMUNITY)
Admission: RE | Admit: 2018-12-13 | Discharge: 2018-12-13 | Disposition: A | Discharge status: Home / Self Care | Payer: Self-pay | Source: Ambulatory Visit | Attending: Internal Medicine | Admitting: Internal Medicine

## 2018-12-13 DIAGNOSIS — I10 Essential (primary) hypertension: Secondary | ICD-10-CM | POA: Insufficient documentation

## 2018-12-13 DIAGNOSIS — J439 Emphysema, unspecified: Secondary | ICD-10-CM | POA: Insufficient documentation

## 2018-12-13 DIAGNOSIS — J9611 Chronic respiratory failure with hypoxia: Secondary | ICD-10-CM | POA: Insufficient documentation

## 2018-12-14 ENCOUNTER — Telehealth: Payer: Self-pay | Admitting: Pulmonary Disease

## 2018-12-14 MED ORDER — ALBUTEROL SULFATE HFA 108 (90 BASE) MCG/ACT IN AERS: INHALATION_SPRAY | RESPIRATORY_TRACT | 3 refills | Status: DC

## 2018-12-14 MED ORDER — BUDESONIDE-FORMOTEROL FUMARATE 160-4.5 MCG/ACT IN AERO: INHALATION_SPRAY | RESPIRATORY_TRACT | 3 refills | Status: DC

## 2018-12-14 NOTE — Telephone Encounter (Signed)
Called and spoke with patient, he stated that he was needing a refill of the albuterol and symbicort both sent to [facility]. Refills sent. Nothing further needed.

## 2018-12-15 ENCOUNTER — Encounter (HOSPITAL_COMMUNITY)
Admission: RE | Admit: 2018-12-15 | Discharge: 2018-12-15 | Disposition: A | Discharge status: Home / Self Care | Payer: Self-pay | Source: Ambulatory Visit | Attending: Pulmonary Disease | Admitting: Pulmonary Disease

## 2018-12-20 ENCOUNTER — Encounter (HOSPITAL_COMMUNITY)
Admission: RE | Admit: 2018-12-20 | Discharge: 2018-12-20 | Disposition: A | Discharge status: Home / Self Care | Payer: Self-pay | Source: Ambulatory Visit | Attending: Pulmonary Disease | Admitting: Pulmonary Disease

## 2018-12-20 DIAGNOSIS — Z85828 Personal history of other malignant neoplasm of skin: Secondary | ICD-10-CM | POA: Diagnosis not present

## 2018-12-20 DIAGNOSIS — C44319 Basal cell carcinoma of skin of other parts of face: Secondary | ICD-10-CM | POA: Diagnosis not present

## 2018-12-20 DIAGNOSIS — D485 Neoplasm of uncertain behavior of skin: Secondary | ICD-10-CM | POA: Diagnosis not present

## 2018-12-20 DIAGNOSIS — C44622 Squamous cell carcinoma of skin of right upper limb, including shoulder: Secondary | ICD-10-CM | POA: Diagnosis not present

## 2018-12-20 DIAGNOSIS — L570 Actinic keratosis: Secondary | ICD-10-CM | POA: Diagnosis not present

## 2018-12-20 DIAGNOSIS — L821 Other seborrheic keratosis: Secondary | ICD-10-CM | POA: Diagnosis not present

## 2018-12-20 DIAGNOSIS — Z85820 Personal history of malignant melanoma of skin: Secondary | ICD-10-CM | POA: Diagnosis not present

## 2018-12-20 DIAGNOSIS — L820 Inflamed seborrheic keratosis: Secondary | ICD-10-CM | POA: Diagnosis not present

## 2018-12-22 ENCOUNTER — Encounter (HOSPITAL_COMMUNITY): Payer: Self-pay

## 2018-12-27 ENCOUNTER — Encounter (HOSPITAL_COMMUNITY): Payer: Self-pay

## 2018-12-29 ENCOUNTER — Encounter (HOSPITAL_COMMUNITY)
Admission: RE | Admit: 2018-12-29 | Discharge: 2018-12-29 | Disposition: A | Discharge status: Home / Self Care | Payer: Self-pay | Source: Ambulatory Visit | Attending: Pulmonary Disease | Admitting: Pulmonary Disease

## 2019-01-03 ENCOUNTER — Encounter (HOSPITAL_COMMUNITY)
Admission: RE | Admit: 2019-01-03 | Discharge: 2019-01-03 | Disposition: A | Discharge status: Home / Self Care | Payer: Self-pay | Source: Ambulatory Visit | Attending: Pulmonary Disease | Admitting: Pulmonary Disease

## 2019-01-05 ENCOUNTER — Encounter (HOSPITAL_COMMUNITY)
Admission: RE | Admit: 2019-01-05 | Discharge: 2019-01-05 | Disposition: A | Discharge status: Home / Self Care | Payer: Self-pay | Source: Ambulatory Visit | Attending: Pulmonary Disease | Admitting: Pulmonary Disease

## 2019-01-10 ENCOUNTER — Encounter (HOSPITAL_COMMUNITY)
Admission: RE | Admit: 2019-01-10 | Discharge: 2019-01-10 | Disposition: A | Discharge status: Home / Self Care | Payer: Self-pay | Source: Ambulatory Visit | Attending: Internal Medicine | Admitting: Internal Medicine

## 2019-01-10 DIAGNOSIS — J439 Emphysema, unspecified: Secondary | ICD-10-CM | POA: Insufficient documentation

## 2019-01-10 DIAGNOSIS — I10 Essential (primary) hypertension: Secondary | ICD-10-CM | POA: Insufficient documentation

## 2019-01-10 DIAGNOSIS — J9611 Chronic respiratory failure with hypoxia: Secondary | ICD-10-CM | POA: Insufficient documentation

## 2019-01-12 ENCOUNTER — Encounter (HOSPITAL_COMMUNITY): Payer: Self-pay

## 2019-01-12 ENCOUNTER — Encounter: Payer: Self-pay | Admitting: Pulmonary Disease

## 2019-01-12 ENCOUNTER — Telehealth (HOSPITAL_COMMUNITY): Payer: Self-pay | Admitting: Family Medicine

## 2019-01-12 ENCOUNTER — Ambulatory Visit (INDEPENDENT_AMBULATORY_CARE_PROVIDER_SITE_OTHER): Payer: Medicare Other | Admitting: Pulmonary Disease

## 2019-01-12 VITALS — BP 128/80 | HR 85

## 2019-01-12 DIAGNOSIS — J9611 Chronic respiratory failure with hypoxia: Secondary | ICD-10-CM | POA: Diagnosis not present

## 2019-01-12 DIAGNOSIS — J441 Chronic obstructive pulmonary disease with (acute) exacerbation: Secondary | ICD-10-CM | POA: Diagnosis not present

## 2019-01-12 NOTE — Patient Instructions (Signed)
COPD with repeated exacerbations: Continue Daliresp Continue azithromycin Continue Symbicort and Spiriva Keep using albuterol as needed for chest tightness wheezing or shortness of breath Practice good hand hygiene Stay active  Chronic respiratory failure with hypercarbia/hypoxemia: Continue using 4 L of oxygen at rest, 6 L with exertion  Follow-up in 2 to 3 months or sooner if needed

## 2019-01-12 NOTE — Progress Notes (Signed)
Subjective:    Patient ID: Harold Hall, male    DOB: 05-30-1946, 73 y.o.   MRN: [id number]  Synopsis: Former patient of Dr. Gwenette Greet with COPD. Dr. Gwenette Greet summarized his situation as follows: Stopped smoking 05/2011 Pulm rehab 2012, 2016, ongoing participation 2018 On exertional oxygen with portable concentrator> 4L rest, 6L with exertion +response to daliresp in past  HPI Chief Complaint  Patient presents with  . Follow-up    States his breathing has been at his baseline. No new symptoms or concerns.    Clearance says that he has been doing fairly well recently.  He had an exacerbation back when I saw him in December but since then things have been stable.  He says that he has more good days than bad. No problems with cough or mucus production right now.    Past Medical History:  Diagnosis Date  . Allergic rhinitis   . Arthritis   . Cancer [facility])    left renal -solitary kidney  . COPD (chronic obstructive pulmonary disease) ([place])   . Erectile dysfunction   . Gout   . Hernia   . History of kidney cancer   . Hypercholesterolemia   . Hypertension   . RLS (restless legs syndrome)   . Sinus problem       Review of Systems  Constitutional: Negative for chills, fatigue and fever.  HENT: Negative for postnasal drip, rhinorrhea and sinus pressure.   Respiratory: Positive for shortness of breath. Negative for cough and wheezing.   Cardiovascular: Positive for leg swelling. Negative for chest pain and palpitations.       Objective:   Physical Exam Vitals:   01/12/19 1153  BP: 128/80  Pulse: 85  SpO2: 93%   6L   Gen: chronically ill appearing HENT: OP clear, TM's clear, neck supple PULM: Poor air movement B, normal percussion CV: RRR, no mgr, trace edema GI: BS+, soft, nontender Derm: no cyanosis or rash Psyche: normal mood and affect     Imaging: December 2017 chest CT images personal reviewed showing significant emphysema with an upper lobe  predominance, no clear interstitial lung disease.  Blood: 09/2016 IgE 4 08/15/2018 Hgb 16.9 mg/dL  08/15/2018 Radiopharmaceutical nuclear GFR 134.44 mL  Echo: 08/15/2018 LVEF > 55%, mod LVH, dilated left atrial, normal RV, no shunt  PFT: [place] 2007:  FEV1 2.10 (46%), ratio 47 PFT 2017 Ratio 47%, FEV1 1.76L (46% pred), FVC 3.78L, TLC 6.45L (82% pred), DLCO 13.18  CBC    Component Value Date/Time   WBC 8.4 10/06/2016 1306   RBC 5.74 10/06/2016 1306   HGB 16.8 10/06/2016 1306   HCT 49.6 10/06/2016 1306   PLT 190.0 10/06/2016 1306   MCV 86.3 10/06/2016 1306   MCH 28.8 06/10/2011 0904   MCHC 33.8 10/06/2016 1306   RDW 15.6 (H) 10/06/2016 1306   LYMPHSABS 1.1 06/10/2011 0904   MONOABS 1.0 06/10/2011 0904   EOSABS 0.3 06/10/2011 0904   BASOSABS 0.0 06/10/2011 0904   Records from the [facility] transplant evaluation committee reviewed: Because of obesity, age, heart condition, and gastroesophageal reflux disease his candidacy for lung transplant was denied.     Assessment & Plan:   COPD exacerbation ([place])  Chronic respiratory failure with hypoxia (HCC)  Discussion: Aside from the exacerbation back in the fall this is been a stable interval for him.  He has not had a flareup since then and he remains compliant with his exercise and the rest of his regimen.  COPD with  repeated exacerbations: Continue Daliresp Continue azithromycin Continue Symbicort and Spiriva Keep using albuterol as needed for chest tightness wheezing or shortness of breath Practice good hand hygiene Stay active  Chronic respiratory failure with hypercarbia/hypoxemia: Continue using 4 L of oxygen at rest, 6 L with exertion  Follow-up in 2 to 3 months or sooner if needed   Current Outpatient Medications:  .  albuterol (PROAIR HFA) 108 (90 Base) MCG/ACT inhaler, INHALE 2 PUFFS BY MOUTH INTO THE LUNGS EVERY 4 HOURS AS NEEDED FOR WHEEZING OR SHORTNESS OF BREATH, Disp: 3 Inhaler, Rfl: 3 .  allopurinol (ZYLOPRIM)  300 MG tablet, Take 300 mg by mouth daily., Disp: , Rfl:  .  ALPRAZolam (XANAX) 0.5 MG tablet, Take 1 tablet (0.5 mg total) by mouth 2 (two) times daily. (Patient taking differently: Take 0.5 mg by mouth at bedtime. ), Disp: 60 tablet, Rfl: 0 .  amLODipine (NORVASC) 10 MG tablet, Take 10 mg by mouth daily. , Disp: , Rfl:  .  atorvastatin (LIPITOR) 20 MG tablet, Take 20 mg by mouth daily., Disp: , Rfl:  .  azelastine (ASTELIN) 0.1 % nasal spray, Place 1 spray into both nostrils 2 (two) times daily. Once daily, Disp: , Rfl: 2 .  azithromycin (ZITHROMAX) 250 MG tablet, Take 1 tablet (250 mg) daily with food., Disp: 90 tablet, Rfl: 2 .  budesonide-formoterol (SYMBICORT) 160-4.5 MCG/ACT inhaler, INHALE 2 PUFFS BY MOUTH INTO THE LUNGS TWICE A DAY, Disp: 3 Inhaler, Rfl: 3 .  doxycycline (VIBRA-TABS) 100 MG tablet, Take 1 tablet (100 mg total) by mouth 2 (two) times daily., Disp: 10 tablet, Rfl: 0 .  ELIQUIS 5 MG TABS tablet, Take 5 mg by mouth 2 (two) times daily., Disp: , Rfl: 0 .  fish oil-omega-3 fatty acids 1000 MG capsule, Take 1 capsule by mouth 3 (three) times daily.  , Disp: , Rfl:  .  flecainide (TAMBOCOR) 100 MG tablet, Take 100 mg by mouth 2 (two) times daily., Disp: , Rfl: 0 .  furosemide (LASIX) 40 MG tablet, Take 40 mg by mouth daily. , Disp: , Rfl:  .  Linaclotide (LINZESS) 145 MCG CAPS capsule, Take 145 mcg by mouth daily., Disp: , Rfl:  .  losartan-hydrochlorothiazide (HYZAAR) 100-25 MG tablet, , Disp: , Rfl:  .  metoprolol succinate (TOPROL-XL) 50 MG 24 hr tablet, Take 50 mg by mouth daily. Take with or immediately following a meal., Disp: , Rfl:  .  montelukast (SINGULAIR) 10 MG tablet, TAKE 1 TABLET(10 MG) BY MOUTH AT BEDTIME, Disp: 90 tablet, Rfl: 2 .  Multiple Vitamin (MULITIVITAMIN WITH MINERALS) TABS, Take 1 tablet by mouth daily., Disp: , Rfl:  .  neomycin-polymyxin b-dexamethasone (MAXITROL) 3.5-10000-0.1 OINT, Only as needed, Disp: , Rfl: 1 .  nitroGLYCERIN (NITROSTAT) 0.4 MG SL  tablet, Place 0.4 mg under the tongue every 5 (five) minutes as needed for chest pain., Disp: , Rfl:  .  potassium chloride (KLOR-CON) 20 MEQ packet, Take 20 mEq by mouth daily., Disp: 3 tablet, Rfl: 0 .  Respiratory Therapy Supplies (FLUTTER) DEVI, Use as directed, Disp: 1 each, Rfl: 0 .  roflumilast (DALIRESP) 500 MCG TABS tablet, Take 1 tablet (500 mcg total) by mouth daily., Disp: 90 tablet, Rfl: 3 .  rOPINIRole (REQUIP) 0.5 MG tablet, Take 1 tablet (0.5 mg total) by mouth daily., Disp: 30 tablet, Rfl: 0 .  temazepam (RESTORIL) 15 MG capsule, Take 15 mg by mouth at bedtime as needed for sleep. , Disp: , Rfl:  .  Tiotropium Bromide  Monohydrate (SPIRIVA RESPIMAT) 2.5 MCG/ACT AERS, Inhale 2 puffs into the lungs daily., Disp: 3 Inhaler, Rfl: 3 .  traMADol (ULTRAM) 50 MG tablet, Take 50 mg by mouth 3 (three) times daily as needed for moderate pain. , Disp: , Rfl: 0 .  vitamin C (ASCORBIC ACID) 500 MG tablet, Take 500 mg by mouth daily., Disp: , Rfl:

## 2019-01-16 ENCOUNTER — Telehealth: Payer: Self-pay | Admitting: Pulmonary Disease

## 2019-01-16 DIAGNOSIS — J432 Centrilobular emphysema: Secondary | ICD-10-CM

## 2019-01-16 MED ORDER — AZITHROMYCIN 250 MG PO TABS: ORAL_TABLET | ORAL | 2 refills | Status: DC

## 2019-01-16 NOTE — Telephone Encounter (Signed)
Patient states at his OV this week he told RN to send his azithromycin Rx to [facility]. Per Dr. Kathee Delton notes states patient should continue. Rx send to [facility] on 01/16/19.   Nothing further needed at this time.

## 2019-01-17 ENCOUNTER — Encounter (HOSPITAL_COMMUNITY)
Admission: RE | Admit: 2019-01-17 | Discharge: 2019-01-17 | Disposition: A | Discharge status: Home / Self Care | Payer: Self-pay | Source: Ambulatory Visit | Attending: Pulmonary Disease | Admitting: Pulmonary Disease

## 2019-01-19 ENCOUNTER — Encounter (HOSPITAL_COMMUNITY)
Admission: RE | Admit: 2019-01-19 | Discharge: 2019-01-19 | Disposition: A | Discharge status: Home / Self Care | Payer: Self-pay | Source: Ambulatory Visit | Attending: Pulmonary Disease | Admitting: Pulmonary Disease

## 2019-01-19 ENCOUNTER — Other Ambulatory Visit: Payer: Self-pay

## 2019-01-23 ENCOUNTER — Telehealth (HOSPITAL_COMMUNITY): Payer: Self-pay

## 2019-01-24 ENCOUNTER — Encounter (HOSPITAL_COMMUNITY): Payer: Self-pay

## 2019-01-24 NOTE — Progress Notes (Signed)
Subjective:  Primary Physician:  Alroy Dust, L.[name], MD  Patient ID: Harold Hall, male    DOB: 09/27/1946, 73 y.o.   MRN: [id number]  Chief Complaint  Patient presents with  . PAD  . Hypertension  . COPD  . Follow-up    93mo   HPI: Harold Hall is a 73y.o. male  with HTN, COPD on home O2 (recently increased to 6L), and HLD, paroxysmal A. Fibrillation presents for 6 month follow up.  Has been maintaing sinus rhythm since being on flecanide in June 2017.He does follow Dr. DAzalee Coursefrom pulmonary standpoint and management of COPD, [facility] since his last office visit, angiography on 09/21/2018 had revealed no significant coronary disease.  He was rejected for transplant. Now on 4-6 L of oxygen. No chest pain, palpitations, syncope.  Past Medical History:  Diagnosis Date  . Allergic rhinitis   . Arthritis   . Cancer [facility]    left renal -solitary kidney  . COPD (chronic obstructive pulmonary disease) ([place]   . Erectile dysfunction   . Gout   . Hernia   . History of kidney cancer   . Hypercholesterolemia   . Hypertension   . RLS (restless legs syndrome)   . Sinus problem     Past Surgical History:  Procedure Laterality Date  . CARDIAC CATHETERIZATION    . HERNIA REPAIR  06/04/11  . left kidney removed  1994    Social History   Socioeconomic History  . Marital status: Married    Spouse name: Not on file  . Number of children: 1  . Years of education: Not on file  . Highest education level: Not on file  Occupational History  . Not on file  Social Needs  . Financial resource strain: Not on file  . Food insecurity:    Worry: Not on file    Inability: Not on file  . Transportation needs:    Medical: Not on file    Non-medical: Not on file  Tobacco Use  . Smoking status: Former Smoker    Packs/day: 1.50    Years: 50.00    Pack years: 75.00   Types: Cigarettes    Last attempt to quit: 06/04/2011    Years since quitting: 7.6  . Smokeless tobacco: Never Used  Substance and Sexual Activity  . Alcohol use: Yes    Alcohol/week: 2.0 - 3.0 standard drinks    Types: 2 - 3 Cans of beer per week    Comment: beers  . Drug use: No  . Sexual activity: Not on file  Lifestyle  . Physical activity:    Days per week: Not on file    Minutes per session: Not on file  . Stress: Not on file  Relationships  . Social connections:    Talks on phone: Not on file    Gets together: Not on file    Attends religious service: Not on file    Active member of club or organization: Not on file    Attends meetings of clubs or organizations: Not on file    Relationship status: Not on file  . Intimate partner violence:    Fear of current or ex partner: Not on file    Emotionally abused: Not on file    Physically abused: Not on file    Forced sexual activity: Not on file  Other Topics Concern  .  Not on file  Social History Narrative  . Not on file    Current Outpatient Medications on File Prior to Visit  Medication Sig Dispense Refill  . albuterol (PROAIR HFA) 108 (90 Base) MCG/ACT inhaler INHALE 2 PUFFS BY MOUTH INTO THE LUNGS EVERY 4 HOURS AS NEEDED FOR WHEEZING OR SHORTNESS OF BREATH 3 Inhaler 3  . allopurinol (ZYLOPRIM) 300 MG tablet Take 300 mg by mouth daily.    Marland Kitchen ALPRAZolam (XANAX) 0.5 MG tablet Take 1 tablet (0.5 mg total) by mouth 2 (two) times daily. (Patient taking differently: Take 0.5 mg by mouth at bedtime. ) 60 tablet 0  . amLODipine (NORVASC) 10 MG tablet Take 10 mg by mouth daily.     Marland Kitchen atorvastatin (LIPITOR) 20 MG tablet Take 20 mg by mouth daily.    Marland Kitchen azelastine (ASTELIN) 0.1 % nasal spray Place 1 spray into both nostrils 2 (two) times daily. Once daily  2  . azithromycin (ZITHROMAX) 250 MG tablet Take 1 tablet (250 mg) daily with food. 90 tablet 2  . budesonide-formoterol (SYMBICORT) 160-4.5 MCG/ACT inhaler INHALE 2 PUFFS BY  MOUTH INTO THE LUNGS TWICE A DAY 3 Inhaler 3  . ELIQUIS 5 MG TABS tablet Take 5 mg by mouth 2 (two) times daily.  0  . fish oil-omega-3 fatty acids 1000 MG capsule Take 1 capsule by mouth 3 (three) times daily.      . flecainide (TAMBOCOR) 100 MG tablet Take 100 mg by mouth 2 (two) times daily.  0  . furosemide (LASIX) 40 MG tablet Take 40 mg by mouth daily.     . Linaclotide (LINZESS) 145 MCG CAPS capsule Take 145 mcg by mouth daily.    Marland Kitchen losartan-hydrochlorothiazide (HYZAAR) 100-25 MG tablet     . metoprolol succinate (TOPROL-XL) 50 MG 24 hr tablet Take 50 mg by mouth daily. Take with or immediately following a meal.    . montelukast (SINGULAIR) 10 MG tablet TAKE 1 TABLET(10 MG) BY MOUTH AT BEDTIME 90 tablet 2  . Multiple Vitamin (MULITIVITAMIN WITH MINERALS) TABS Take 1 tablet by mouth daily.    Marland Kitchen neomycin-polymyxin b-dexamethasone (MAXITROL) 3.5-10000-0.1 OINT Only as needed  1  . nitroGLYCERIN (NITROSTAT) 0.4 MG SL tablet Place 0.4 mg under the tongue every 5 (five) minutes as needed for chest pain.    . potassium chloride (KLOR-CON) 20 MEQ packet Take 20 mEq by mouth daily. 3 tablet 0  . roflumilast (DALIRESP) 500 MCG TABS tablet Take 1 tablet (500 mcg total) by mouth daily. 90 tablet 3  . rOPINIRole (REQUIP) 0.5 MG tablet Take 1 tablet (0.5 mg total) by mouth daily. 30 tablet 0  . temazepam (RESTORIL) 15 MG capsule Take 15 mg by mouth at bedtime as needed for sleep.     . Tiotropium Bromide Monohydrate (SPIRIVA RESPIMAT) 2.5 MCG/ACT AERS Inhale 2 puffs into the lungs daily. 3 Inhaler 3  . traMADol (ULTRAM) 50 MG tablet Take 50 mg by mouth 3 (three) times daily as needed for moderate pain.   0  . vitamin C (ASCORBIC ACID) 500 MG tablet Take 500 mg by mouth daily.    Marland Kitchen Respiratory Therapy Supplies (FLUTTER) DEVI Use as directed 1 each 0   No current facility-administered medications on file prior to visit.     Review of Systems  Constitutional: Negative for malaise/fatigue and weight  loss.  Respiratory: Positive for shortness of breath (on Exertion, stable). Negative for cough and hemoptysis.   Cardiovascular: Positive for leg swelling (chronic, stable). Negative  for chest pain (improving since last seen, but does still occur), palpitations, orthopnea and claudication.  Gastrointestinal: Negative for abdominal pain, blood in stool, constipation, heartburn and vomiting.  Genitourinary: Negative for dysuria.  Musculoskeletal: Negative for joint pain and myalgias.  Neurological: Negative for dizziness, focal weakness and headaches.  Endo/Heme/Allergies: Does not bruise/bleed easily.  Psychiatric/Behavioral: Negative for depression. The patient is not nervous/anxious.   All other systems reviewed and are negative.      Objective:  Blood pressure 131/60, pulse 65, height '6\' 2"'  (1.88 m), weight 290 lb 4.8 oz (131.7 kg), SpO2 93 %. Body mass index is 37.27 kg/m.  Physical Exam  Constitutional: He appears well-developed and well-nourished. No distress.  Moderately Obese   HENT:  Head: Atraumatic.  Eyes: Conjunctivae are normal.  Neck: Neck supple. No JVD present. No thyromegaly present.  Cardiovascular: Normal rate, regular rhythm and intact distal pulses. Exam reveals no gallop.  Murmur heard.  Midsystolic murmur is present with a grade of 2/6 at the apex. Aortic Area Pulses:      Carotid pulses are on the right side with bruit and on the left side with bruit. Pulmonary/Chest: Effort normal and breath sounds normal.  Abdominal: Soft. Bowel sounds are normal.  Musculoskeletal: Normal range of motion.        General: No edema.  Neurological: He is alert.  Skin: Skin is warm and dry.  Psychiatric: He has a normal mood and affect.   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 Moderate pulmonary hypertension, mean pressure 32 mmHg.  Sleep  Study [facility] 10/02/2018: Sleep Apnea mild. Not on CPAP  Carotid artery duplex 02/01/2018: No hemodynamically significant arterial disease in the internal carotid artery bilaterally. Right vertebral artery flow is not visualized. Left vertebral artery flow is not visualized. Compared to study done on 09/26/2014, left ICA stenosis of less than 50% not present. Previously vertebral arteries were well visualized.  Event Monitor 2 weeks 09/04/19 -09/09/2016: No symptoms reported. NSR.   Echocardiogram 05/07/2016: Left ventricle cavity is normal in size. Moderate concentric hypertrophy of the left ventricle. Hyperdynamic LV systolic function with cavitary obliteration. Suspect intraventricular pressure gradient. Doppler evidence of grade I (impaired) diastolic dysfunction. Visual EF is 65-70%. Left atrial cavity is severely dilated at 5.5cm. Moderate tricuspid regurgitation. Moderate pulmonary hypertension. Pulmonary artery systolic pressure is estimated at 48m Hg. Insignificant pericardial effusion. IVC is dilated with respiratory variation. This suggests elevated right heart pressure. Compared to the study done on 02/17/2014, LA size was mildly dilated and Pul HTN new.  Lexiscan myoview stress test 05/01/2016: 1. The resting electrocardiogram demonstrated atrial fibrillation, normal resting conduction and nonspecific ST-T changes. Stress EKG is non-diagnostic for ischemia as it a pharmacologic stress using Lexiscan. Stress symptoms included dyspnea. 2. The perfusion imaging study demonstrates mild diaphragmatic attenuation artifact in the inferior wall, there is no evidence of ischemia or scar. Left ventricle systolic function calculated by QGS was 86%. This is a low risk study. No significant change from 08/31/2014.  Chest X-Ray 04/21/2016: 1. Stable cardiomegaly.  No pulmonary venous congestion. 2. Stable chronic interstitial changes consistent with interstitial fibrosis.  CT angio 05/22/2011:  Mediastinal lymph nodes are not enlarged by CT size criteria. No hilar or axillary adenopathy. Atherosclerotic calcification of the arterial vasculature, including coronary arteries. Heart size normal. Anterior pericardial fluid or thickening. Pulmonary arteries are enlarged. Biapical pleural parenchymal scarring, left greater than  right. Emphysema. Focal mucoid impaction in the anterior segment right upper lobe (image 25). Lungs are otherwise clear. No pleural fluid. Scattered debris in the trachea.  Recent Labs:  Labs 09/21/2018: HB 16.2.  HCT 49.3.  Platelets 178.  Normal indicis.  BUN 27, creatinine 1.25, eGFR 57 mL.  Potassium 3.3.  Serum glucose 110 mg.  Total cholesterol 140, triglycerides 134, HDL 40, LDL 68.1.  02/22/2018: Creatinine 1.37, EGFR 51/62, potassium 3.6, CMP otherwise normal.  Proteinuria.   Assessment & Recommendations:   PAD (peripheral artery disease) (HCC)  Paroxysmal atrial fibrillation (HCC) - Plan: EKG 12-Lead  Essential hypertension  Chronic respiratory failure with hypoxia (HCC)  Hypercholesteremia  Bilateral carotid bruits CHA2DS2-VASc Score is 3.  -(CHF; HTN; vasc disease DM,  Male = 1; Age <65 =0; 65-74 = 1,  >75 =2; stroke = 2).  -(Yearly risk of stroke: Score of 1=1.3; 2=2.2; 3=3.2; 4=4; 5=6.7; 6=9.8; 7=>9.8)  EKG 01/25/2019: Sinus rhythm with first-degree AV block at rate of 66 bpm, left atrial abnormality.  Left axis deviation.  Incomplete right bundle branch block.  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  Normal QT interval.  Nonspecific T abnormality. No significant change from  EKG 11/24/2018, 07/04/2018.   Recommendation:  Patient presents here for 6 month office visit and follow-up of atrial fibrillation and hypertension and hyperlipidemia, I have reviewed the results of the recently performed labs, lipids are under excellent control, he maintained sinus rhythm on Flecainide.  I also reviewed his cardiac catheterization report, he does have moderate  pulmonary hypertension related to underlying COPD.  No clinical evidence of congestive heart failure.  No changes in the medications were done today.  I'll see him back in 6 months.  He does have bilateral carotid artery bruit, carotid artery duplex revealed no significant disease.  Adrian Prows, MD, [facility] 01/25/2019, 11:38 AM [facility]. [place] Pager: [phone] Office: [phone] If no answer Cell [phone]

## 2019-01-25 ENCOUNTER — Other Ambulatory Visit: Payer: Self-pay

## 2019-01-25 ENCOUNTER — Ambulatory Visit (INDEPENDENT_AMBULATORY_CARE_PROVIDER_SITE_OTHER): Payer: Medicare Other | Admitting: Cardiology

## 2019-01-25 ENCOUNTER — Encounter: Payer: Self-pay | Admitting: Cardiology

## 2019-01-25 VITALS — BP 131/60 | HR 65 | Ht 74.0 in | Wt 290.3 lb

## 2019-01-25 DIAGNOSIS — I480 Paroxysmal atrial fibrillation: Secondary | ICD-10-CM

## 2019-01-25 DIAGNOSIS — J9611 Chronic respiratory failure with hypoxia: Secondary | ICD-10-CM

## 2019-01-25 DIAGNOSIS — R0989 Other specified symptoms and signs involving the circulatory and respiratory systems: Secondary | ICD-10-CM

## 2019-01-25 DIAGNOSIS — I10 Essential (primary) hypertension: Secondary | ICD-10-CM

## 2019-01-25 DIAGNOSIS — E7800 Pure hypercholesterolemia, unspecified: Secondary | ICD-10-CM | POA: Diagnosis not present

## 2019-01-26 ENCOUNTER — Encounter (HOSPITAL_COMMUNITY): Payer: Self-pay

## 2019-01-31 ENCOUNTER — Encounter (HOSPITAL_COMMUNITY): Payer: Self-pay

## 2019-02-01 ENCOUNTER — Telehealth (HOSPITAL_COMMUNITY): Payer: Self-pay | Admitting: *Deleted

## 2019-02-02 ENCOUNTER — Encounter (HOSPITAL_COMMUNITY): Payer: Self-pay

## 2019-02-07 ENCOUNTER — Encounter (HOSPITAL_COMMUNITY): Payer: Self-pay

## 2019-02-09 ENCOUNTER — Encounter (HOSPITAL_COMMUNITY): Payer: Self-pay

## 2019-02-14 ENCOUNTER — Encounter (HOSPITAL_COMMUNITY): Payer: Self-pay

## 2019-02-16 ENCOUNTER — Encounter (HOSPITAL_COMMUNITY): Payer: Self-pay

## 2019-02-21 ENCOUNTER — Telehealth (HOSPITAL_COMMUNITY): Payer: Self-pay | Admitting: *Deleted

## 2019-02-21 ENCOUNTER — Encounter (HOSPITAL_COMMUNITY): Payer: Self-pay

## 2019-02-23 ENCOUNTER — Encounter (HOSPITAL_COMMUNITY): Payer: Self-pay

## 2019-02-28 ENCOUNTER — Encounter (HOSPITAL_COMMUNITY): Payer: Self-pay

## 2019-03-02 ENCOUNTER — Encounter (HOSPITAL_COMMUNITY): Payer: Self-pay

## 2019-03-03 DIAGNOSIS — N183 Chronic kidney disease, stage 3 (moderate): Secondary | ICD-10-CM | POA: Diagnosis not present

## 2019-03-03 DIAGNOSIS — I4891 Unspecified atrial fibrillation: Secondary | ICD-10-CM | POA: Diagnosis not present

## 2019-03-03 DIAGNOSIS — E782 Mixed hyperlipidemia: Secondary | ICD-10-CM | POA: Diagnosis not present

## 2019-03-03 DIAGNOSIS — E669 Obesity, unspecified: Secondary | ICD-10-CM | POA: Diagnosis not present

## 2019-03-03 DIAGNOSIS — M109 Gout, unspecified: Secondary | ICD-10-CM | POA: Diagnosis not present

## 2019-03-03 DIAGNOSIS — G4700 Insomnia, unspecified: Secondary | ICD-10-CM | POA: Diagnosis not present

## 2019-03-03 DIAGNOSIS — G2581 Restless legs syndrome: Secondary | ICD-10-CM | POA: Diagnosis not present

## 2019-03-03 DIAGNOSIS — I10 Essential (primary) hypertension: Secondary | ICD-10-CM | POA: Diagnosis not present

## 2019-03-03 DIAGNOSIS — F411 Generalized anxiety disorder: Secondary | ICD-10-CM | POA: Diagnosis not present

## 2019-03-03 DIAGNOSIS — J449 Chronic obstructive pulmonary disease, unspecified: Secondary | ICD-10-CM | POA: Diagnosis not present

## 2019-03-03 DIAGNOSIS — K5900 Constipation, unspecified: Secondary | ICD-10-CM | POA: Diagnosis not present

## 2019-03-07 ENCOUNTER — Encounter (HOSPITAL_COMMUNITY): Payer: Self-pay

## 2019-03-08 ENCOUNTER — Telehealth: Payer: Self-pay | Admitting: Pulmonary Disease

## 2019-03-08 NOTE — Telephone Encounter (Signed)
Returned call to patient who states he received letter from [place] stating he is due for 02 recert. He says letter states he must have appt scheduled with in 3 days. Pt refused and made [employer] aware due to COVID and his lung condition he was not going into a doctors office. I explained last walk was 06/2018 not sure what date he was needing walk by. Advised pt to call Medicare and inquire if August would suffise. Pt scheduled appt 06/13/19 with BM as BQ does not have a schedule as of yet. Nothing further needed.

## 2019-03-09 ENCOUNTER — Encounter (HOSPITAL_COMMUNITY): Payer: Self-pay

## 2019-03-14 ENCOUNTER — Encounter (HOSPITAL_COMMUNITY): Payer: Self-pay

## 2019-03-16 ENCOUNTER — Encounter (HOSPITAL_COMMUNITY): Payer: Self-pay

## 2019-03-21 ENCOUNTER — Encounter (HOSPITAL_COMMUNITY): Payer: Self-pay

## 2019-03-23 ENCOUNTER — Encounter (HOSPITAL_COMMUNITY): Payer: Self-pay

## 2019-03-28 ENCOUNTER — Encounter (HOSPITAL_COMMUNITY): Payer: Self-pay

## 2019-03-30 ENCOUNTER — Encounter (HOSPITAL_COMMUNITY): Payer: Self-pay

## 2019-03-31 ENCOUNTER — Other Ambulatory Visit: Payer: Self-pay

## 2019-03-31 DIAGNOSIS — M179 Osteoarthritis of knee, unspecified: Secondary | ICD-10-CM

## 2019-03-31 DIAGNOSIS — N183 Chronic kidney disease, stage 3 (moderate): Secondary | ICD-10-CM | POA: Diagnosis not present

## 2019-03-31 DIAGNOSIS — M1710 Unilateral primary osteoarthritis, unspecified knee: Secondary | ICD-10-CM

## 2019-03-31 MED ORDER — TRAMADOL HCL 50 MG PO TABS: 50.0000 mg | ORAL_TABLET | Freq: Three times a day (TID) | ORAL | 0 refills | Status: DC | PRN

## 2019-04-04 ENCOUNTER — Encounter (HOSPITAL_COMMUNITY): Payer: Self-pay

## 2019-04-06 ENCOUNTER — Encounter (HOSPITAL_COMMUNITY): Payer: Self-pay

## 2019-04-11 ENCOUNTER — Encounter (HOSPITAL_COMMUNITY): Payer: Self-pay

## 2019-04-13 ENCOUNTER — Encounter (HOSPITAL_COMMUNITY): Payer: Self-pay

## 2019-04-18 ENCOUNTER — Encounter (HOSPITAL_COMMUNITY): Payer: Self-pay

## 2019-04-19 ENCOUNTER — Telehealth: Payer: Self-pay | Admitting: Pulmonary Disease

## 2019-04-19 DIAGNOSIS — J432 Centrilobular emphysema: Secondary | ICD-10-CM

## 2019-04-19 MED ORDER — AZITHROMYCIN 250 MG PO TABS: ORAL_TABLET | ORAL | 0 refills | Status: DC

## 2019-04-19 NOTE — Addendum Note (Signed)
Addended by: Nena Polio on: 04/19/2019 05:14 PM   Modules accepted: Orders

## 2019-04-19 NOTE — Telephone Encounter (Signed)
Called the patient and made him aware of response received. Confirmed Azithromycin needs to be sent to [facility]. Prescription sent.  Patient already scheduled for 06/13/19 with Wyn Quaker for O2 recert.   Patient wanted to know why he needed to have another EKG when he already has them twice a year by Dr. Einar Gip. Patient stated he just had one in March 2020 at cardiology.  Patient wants to know if the EKG has to be done in 3 months, when he will be due for one by cardiology around September?  Message routed to Eric Form, NP app of the day.  Sarah, I told the patient of the EKG you stated and come back in 3 months to see McQuaid. But based on the above, the patient questioned it. Please advise if he should have it done same day he comes in to see Aaron Edelman on 06/13/19 or it can wait until he next cardiology appointment on 08/03/19 with Dr. Einar Gip? Thank you.

## 2019-04-19 NOTE — Telephone Encounter (Signed)
I checked last EKG and QTc was .420 so ok to renew for 90 days( send in additional 60 days as he picked up 30 tabs) already. Please schedule for OV with EKG in 3 months with McQuaid Thanks

## 2019-04-19 NOTE — Telephone Encounter (Signed)
Spoke with pt, he states that he usually gets a 90 day supply of azithromycin but received a 30 day supply today from his pharmacy. Can we send in a 90 day supply? Last EKG was on 01/25/2019 done by Dr. Einar Gip.   Patient Instructions by Juanito Doom, MD at 01/12/2019 11:30 AM  Author: Juanito Doom, MD Author Type: Physician Filed: 01/12/2019 12:09 PM  Note Status: Signed Cosign: Cosign Not Required Encounter Date: 01/12/2019  Editor: Juanito Doom, MD (Physician)    COPD with repeated exacerbations: Continue Daliresp Continue azithromycin Continue Symbicort and Spiriva Keep using albuterol as needed for chest tightness wheezing or shortness of breath Practice good hand hygiene Stay active  Chronic respiratory failure with hypercarbia/hypoxemia: Continue using 4 L of oxygen at rest, 6 L with exertion  Follow-up in 2 to 3 months or sooner if needed

## 2019-04-19 NOTE — Telephone Encounter (Signed)
As long as he is having 2 EKG's per year to monitor for QTc prolongation that is fine. I dont care where they are done. We just need to have the information available to [place] as we prescribe the Azithromycin

## 2019-04-19 NOTE — Telephone Encounter (Signed)
Call made to patient, made aware of recommendations per SG. Voiced understanding. Nothing further is needed at this time.

## 2019-04-20 ENCOUNTER — Encounter (HOSPITAL_COMMUNITY): Payer: Self-pay

## 2019-04-25 ENCOUNTER — Encounter (HOSPITAL_COMMUNITY): Payer: Self-pay

## 2019-04-27 ENCOUNTER — Encounter (HOSPITAL_COMMUNITY): Payer: Self-pay

## 2019-05-02 ENCOUNTER — Encounter (HOSPITAL_COMMUNITY): Payer: Self-pay

## 2019-05-04 ENCOUNTER — Encounter (HOSPITAL_COMMUNITY): Payer: Self-pay

## 2019-05-06 ENCOUNTER — Other Ambulatory Visit: Payer: Self-pay | Admitting: Pulmonary Disease

## 2019-05-09 ENCOUNTER — Encounter (HOSPITAL_COMMUNITY): Payer: Self-pay

## 2019-05-11 ENCOUNTER — Encounter (HOSPITAL_COMMUNITY): Payer: Self-pay

## 2019-05-16 ENCOUNTER — Encounter (HOSPITAL_COMMUNITY): Payer: Self-pay

## 2019-05-17 ENCOUNTER — Telehealth (HOSPITAL_COMMUNITY): Payer: Self-pay | Admitting: *Deleted

## 2019-05-18 ENCOUNTER — Encounter (HOSPITAL_COMMUNITY): Payer: Self-pay

## 2019-05-22 DIAGNOSIS — D485 Neoplasm of uncertain behavior of skin: Secondary | ICD-10-CM | POA: Diagnosis not present

## 2019-05-22 DIAGNOSIS — C44311 Basal cell carcinoma of skin of nose: Secondary | ICD-10-CM | POA: Diagnosis not present

## 2019-05-22 DIAGNOSIS — C44319 Basal cell carcinoma of skin of other parts of face: Secondary | ICD-10-CM | POA: Diagnosis not present

## 2019-05-22 DIAGNOSIS — B078 Other viral warts: Secondary | ICD-10-CM | POA: Diagnosis not present

## 2019-05-23 ENCOUNTER — Encounter (HOSPITAL_COMMUNITY): Payer: Self-pay

## 2019-05-25 ENCOUNTER — Encounter (HOSPITAL_COMMUNITY): Payer: Self-pay

## 2019-05-30 ENCOUNTER — Encounter (HOSPITAL_COMMUNITY): Payer: Self-pay

## 2019-05-30 DIAGNOSIS — C44622 Squamous cell carcinoma of skin of right upper limb, including shoulder: Secondary | ICD-10-CM | POA: Diagnosis not present

## 2019-05-30 DIAGNOSIS — L905 Scar conditions and fibrosis of skin: Secondary | ICD-10-CM | POA: Diagnosis not present

## 2019-06-01 ENCOUNTER — Encounter (HOSPITAL_COMMUNITY): Payer: Self-pay

## 2019-06-06 ENCOUNTER — Encounter (HOSPITAL_COMMUNITY): Payer: Self-pay

## 2019-06-08 ENCOUNTER — Encounter (HOSPITAL_COMMUNITY): Payer: Self-pay

## 2019-06-09 ENCOUNTER — Telehealth: Payer: Self-pay | Admitting: Pulmonary Disease

## 2019-06-09 NOTE — Telephone Encounter (Signed)
Spoke with a lady from [employer] and she stated pt needed a qualifying walk at his visit with Aaron Edelman on 06/13/2019. I will route to [place] as FYI. Nothing further is needed.

## 2019-06-09 NOTE — Telephone Encounter (Signed)
Appt note updated to reflect need for o2 recert qualifying walk.

## 2019-06-09 NOTE — Telephone Encounter (Signed)
Unable to see the appointment note.  Can we just make sure that it is listed in the appointment note so that way whoever is working with me that day dose to walk the patient for a qualifying walk.Wyn Quaker, FNP

## 2019-06-12 NOTE — Progress Notes (Signed)
@Patient  ID: Harold Hall, male    DOB: Jan 30, 1946, 73 y.o.   MRN: [id number]  Chief Complaint  Patient presents with  . Follow-up    COPD, follow-up, needs qualify her for oxygen    Referring provider: Alroy Dust, L.[name], MD  HPI:  73 year old male former smoker followed in our office for COPD  PMH: Abdominal pain, restless leg syndrome, A. fib, edema Smoker/ Smoking History: Former Smoker.  Quit 2012.  75-pack-year smoking history. Maintenance:  Symbicort 160, Spiriva Resp 2.5, Daliresp, azithromycin 250 mg tablet Pt of: Dr. Lake Bells   06/13/2019  - Visit   73 year old male former smoker followed in our office for COPD as well as chronic respiratory failure.  Patient is maintained on Symbicort 160 as well as Spiriva Respimat 2.5.  He is reporting that for the last 4 to 5 weeks he has had worsening shortness of breath as well as a productive cough with yellow sputum.  He does have occasional wheezing.  Patient admits he has not been as active as he previously was prior to the COVID-19 pandemic.  He is not going on errands or walking in public as much as he used to.  Patient was also scheduled for an office visit so that he could qualify for oxygen based off of insurance need.  When patient presented to our office on room air his oxygen levels were 84%.  He was placed on 2 L of O2 where they maintained greater than 90%.  Patient also feels that his weight has increased, potentially may have abdominal swelling, may also have lower extremity swelling.  He reports that he contacted his cardiologist Dr. Einar Gip last week and was told to double up on his Lasix.  He has not been seen in office by cardiology since November/2019 per patient.  Patient admits he does struggle sometimes with financially paying for his medications.  He is wondering if we have any samples of his inhalers today.  MMRC - Breathlessness Score 3 - I stop for breath after walking about 100 yards or after a few minutes on  level ground (isle at grocery store is 110ft)     Tests:   Imaging: December 2017 chest CT images showing significant emphysema with an upper lobe predominance, no clear interstitial lung disease.  Blood: 09/2016 IgE 4 08/15/2018 Hgb 16.9 mg/dL  08/15/2018 Radiopharmaceutical nuclear GFR 134.44 mL  Echo: 08/15/2018 LVEF > 55%, mod LVH, dilated left atrial, normal RV, no shunt  PFT: Spiro 2007:  FEV1 2.10 (46%), ratio 47 PFT 2017 Ratio 47%, FEV1 1.76L (46% pred), FVC 3.78L, TLC 6.45L (82% pred), DLCO 13.18  FENO:  No results found for: NITRICOXIDE  PFT: PFT Results Latest Ref Rng & Units 04/22/2016  FVC-Pre L 3.58  FVC-Predicted Pre % 69  FVC-Post L 3.78  FVC-Predicted Post % 73  Pre FEV1/FVC % % 49  Post FEV1/FCV % % 47  FEV1-Pre L 1.77  FEV1-Predicted Pre % 46  FEV1-Post L 1.76  DLCO UNC% % 34  DLCO COR %Predicted % 48  TLC L 6.45  TLC % Predicted % 82  RV % Predicted % 93    Imaging: Dg Chest 2 View  Result Date: 06/13/2019 CLINICAL DATA:  Shortness of breath. EXAM: CHEST - 2 VIEW COMPARISON:  Two-view chest x-ray 04/26/2018 FINDINGS: Heart is enlarged. Diffuse interstitial pattern is similar the prior study. No superimposed airspace disease is present. There is no edema or effusion. There is no pneumothorax. Visualized soft tissues and bony thorax  are unremarkable. IMPRESSION: 1. No acute cardiopulmonary disease or significant interval change. 2. Stable chronic interstitial changes. Electronically Signed   By: San Morelle M.D.   On: 06/13/2019 12:40      Specialty Problems      Pulmonary Problems   COPD (chronic obstructive pulmonary disease) with emphysema ([place])    Spiro 2007:  FEV1 2.10 (46%), ratio 47 Stopped smoking 05/2011 Pulm rehab 2012, 2016 On exertional oxygen with portable concentrator. +response to daliresp.  PFT 2017 Ratio 47%, FEV1 1.76L (46% pred), FVC 3.78L, TLC 6.45L (82% pred), DLCO 13.18  05/10/18 - started chronic prof  azithromycin      Pulmonary nodule    Ct chest 05/2011:  Nodule questioned on cxr not seen.       COPD exacerbation (HCC)   Chronic respiratory failure with hypoxia (HCC)    3L with exertion, 4-5 with heavy exertoin      Dyspnea    09/2016 HRCT > diffuse bronchial wall thickening and emphysema, no ILD, apical capping noted         No Known Allergies  Immunization History  Administered Date(s) Administered  . Influenza Split 08/13/2011, 08/21/2012, 08/17/2014, 08/24/2015  . Influenza Whole 08/12/2009, 08/09/2010  . Influenza, High Dose Seasonal PF 08/09/2013, 08/13/2016, 09/05/2017, 07/08/2018  . Pneumococcal Conjugate-13 08/17/2014  . Pneumococcal Polysaccharide-23 08/10/2007    Past Medical History:  Diagnosis Date  . Allergic rhinitis   . Arthritis   . Cancer [facility])    left renal -solitary kidney  . COPD (chronic obstructive pulmonary disease) ([place])   . Erectile dysfunction   . Gout   . Hernia   . History of kidney cancer   . Hypercholesterolemia   . Hypertension   . RLS (restless legs syndrome)   . Sinus problem     Tobacco History: Social History   Tobacco Use  Smoking Status Former Smoker  . Packs/day: 1.50  . Years: 50.00  . Pack years: 75.00  . Types: Cigarettes  . Quit date: 06/04/2011  . Years since quitting: 8.0  Smokeless Tobacco Never Used   Counseling given: Yes   Continue to not smoke  Outpatient Encounter Medications as of 06/13/2019  Medication Sig  . albuterol (PROAIR HFA) 108 (90 Base) MCG/ACT inhaler INHALE 2 PUFFS BY MOUTH INTO THE LUNGS EVERY 4 HOURS AS NEEDED FOR WHEEZING OR SHORTNESS OF BREATH  . allopurinol (ZYLOPRIM) 300 MG tablet Take 300 mg by mouth daily.  Marland Kitchen ALPRAZolam (XANAX) 0.5 MG tablet Take 1 tablet (0.5 mg total) by mouth 2 (two) times daily. (Patient taking differently: Take 0.5 mg by mouth at bedtime. )  . amLODipine (NORVASC) 10 MG tablet Take 10 mg by mouth daily.   Marland Kitchen atorvastatin (LIPITOR) 20 MG tablet Take 20 mg  by mouth daily.  Marland Kitchen azelastine (ASTELIN) 0.1 % nasal spray Place 1 spray into both nostrils 2 (two) times daily. Once daily  . azithromycin (ZITHROMAX) 250 MG tablet Take 1 tablet (250 mg) daily with food.  . budesonide-formoterol (SYMBICORT) 160-4.5 MCG/ACT inhaler INHALE 2 PUFFS BY MOUTH INTO THE LUNGS TWICE A DAY  . ELIQUIS 5 MG TABS tablet Take 5 mg by mouth 2 (two) times daily.  . fish oil-omega-3 fatty acids 1000 MG capsule Take 1 capsule by mouth 3 (three) times daily.    . flecainide (TAMBOCOR) 100 MG tablet Take 100 mg by mouth 2 (two) times daily.  . furosemide (LASIX) 40 MG tablet Take 40 mg by mouth daily.   . Linaclotide (  LINZESS) 145 MCG CAPS capsule Take 145 mcg by mouth daily.  Marland Kitchen losartan-hydrochlorothiazide (HYZAAR) 100-25 MG tablet   . metoprolol succinate (TOPROL-XL) 50 MG 24 hr tablet Take 50 mg by mouth daily. Take with or immediately following a meal.  . montelukast (SINGULAIR) 10 MG tablet TAKE 1 TABLET AT BEDTIME  . Multiple Vitamin (MULITIVITAMIN WITH MINERALS) TABS Take 1 tablet by mouth daily.  . nitroGLYCERIN (NITROSTAT) 0.4 MG SL tablet Place 0.4 mg under the tongue every 5 (five) minutes as needed for chest pain.  . potassium chloride (KLOR-CON) 20 MEQ packet Take 20 mEq by mouth daily.  Marland Kitchen Respiratory Therapy Supplies (FLUTTER) DEVI Use as directed  . roflumilast (DALIRESP) 500 MCG TABS tablet Take 1 tablet (500 mcg total) by mouth daily.  Marland Kitchen rOPINIRole (REQUIP) 0.5 MG tablet Take 1 tablet (0.5 mg total) by mouth daily.  . temazepam (RESTORIL) 15 MG capsule Take 15 mg by mouth at bedtime as needed for sleep.   . Tiotropium Bromide Monohydrate (SPIRIVA RESPIMAT) 2.5 MCG/ACT AERS Inhale 2 puffs into the lungs daily.  . traMADol (ULTRAM) 50 MG tablet Take 1 tablet (50 mg total) by mouth 3 (three) times daily as needed for moderate pain.  . vitamin C (ASCORBIC ACID) 500 MG tablet Take 500 mg by mouth daily.  Marland Kitchen doxycycline (VIBRA-TABS) 100 MG tablet Take 1 tablet (100 mg  total) by mouth 2 (two) times daily.  Marland Kitchen neomycin-polymyxin b-dexamethasone (MAXITROL) 3.5-10000-0.1 OINT Only as needed  . nystatin (MYCOSTATIN) 100000 UNIT/ML suspension Take 5 mLs (500,000 Units total) by mouth 4 (four) times daily.   No facility-administered encounter medications on file as of 06/13/2019.      Review of Systems  Review of Systems  Constitutional: Positive for fatigue. Negative for activity change, diaphoresis and fever.  HENT: Positive for congestion. Negative for postnasal drip, sneezing, sore throat and trouble swallowing.   Eyes: Negative.   Respiratory: Positive for cough, shortness of breath and wheezing.        Increased orthopnea   Cardiovascular: Positive for leg swelling. Negative for chest pain and palpitations.  Gastrointestinal: Negative for diarrhea, nausea and vomiting.  Endocrine: Negative.   Genitourinary: Negative.   Musculoskeletal: Negative.  Negative for arthralgias.  Skin: Negative.   Allergic/Immunologic: Negative.   Neurological: Negative.   Hematological: Negative.   Psychiatric/Behavioral: Negative.  Negative for dysphoric mood. The patient is not nervous/anxious.      Physical Exam  BP 124/72 (BP Location: Left Arm, Cuff Size: Large)   Pulse 67   Temp 98.2 F (36.8 C) (Oral)   Ht 6\' 2"  (1.88 m)   Wt (!) 305 lb 9.6 oz (138.6 kg)   SpO2 (!) 81%   BMI 39.24 kg/m   Wt Readings from Last 5 Encounters:  06/13/19 (!) 305 lb 9.6 oz (138.6 kg)  01/25/19 290 lb 4.8 oz (131.7 kg)  10/13/18 292 lb (132.5 kg)  10/02/18 295 lb (133.8 kg)  09/08/18 296 lb 6.4 oz (134.4 kg)   Discussed weight increase today  Physical Exam Vitals signs and nursing note reviewed.  Constitutional:      General: He is not in acute distress.    Appearance: He is obese.     Comments: Chronically ill elderly male  HENT:     Head: Normocephalic and atraumatic.     Right Ear: Hearing, tympanic membrane, ear canal and external ear normal.     Left Ear:  Hearing, tympanic membrane, ear canal and external ear normal.  Nose: Nose normal. No mucosal edema or rhinorrhea.     Mouth/Throat:     Mouth: Mucous membranes are dry.     Pharynx: No oropharyngeal exudate.      Comments: White patchy areas in back of throat Eyes:     Pupils: Pupils are equal, round, and reactive to light.  Neck:     Musculoskeletal: Normal range of motion.  Cardiovascular:     Rate and Rhythm: Normal rate and regular rhythm.     Pulses: Normal pulses.     Heart sounds: Normal heart sounds. No murmur.  Pulmonary:     Effort: Pulmonary effort is normal.     Breath sounds: Examination of the left-lower field reveals decreased breath sounds. Decreased breath sounds present. No wheezing, rhonchi or rales.     Comments: Diminished breath sounds in left lower lobe Abdominal:     General: Bowel sounds are normal. There is no distension.     Tenderness: There is no abdominal tenderness.     Comments: Tight, truncal obesity, questionable abdominal edema  Musculoskeletal:     Right lower leg: Edema (2-3+ lower extremity) present.     Left lower leg: Edema (2-3+ lower extremity) present.  Lymphadenopathy:     Cervical: No cervical adenopathy.  Skin:    General: Skin is warm and dry.     Capillary Refill: Capillary refill takes less than 2 seconds.     Findings: No erythema or rash.  Neurological:     General: No focal deficit present.     Mental Status: He is alert and oriented to person, place, and time.     Motor: No weakness.     Coordination: Coordination normal.     Gait: Gait is intact. Gait normal.  Psychiatric:        Mood and Affect: Mood normal.        Behavior: Behavior normal. Behavior is cooperative.        Thought Content: Thought content normal.        Judgment: Judgment normal.      Lab Results:  CBC    Component Value Date/Time   WBC 8.4 10/06/2016 1306   RBC 5.74 10/06/2016 1306   HGB 16.8 10/06/2016 1306   HCT 49.6 10/06/2016 1306    PLT 190.0 10/06/2016 1306   MCV 86.3 10/06/2016 1306   MCH 28.8 06/10/2011 0904   MCHC 33.8 10/06/2016 1306   RDW 15.6 (H) 10/06/2016 1306   LYMPHSABS 1.1 06/10/2011 0904   MONOABS 1.0 06/10/2011 0904   EOSABS 0.3 06/10/2011 0904   BASOSABS 0.0 06/10/2011 0904    BMET    Component Value Date/Time   NA 143 06/10/2011 0904   K 4.1 06/10/2011 0904   CL 101 06/10/2011 0904   CO2 31 06/10/2011 0904   GLUCOSE 118 (H) 06/10/2011 0904   BUN 22 06/10/2011 0904   CREATININE 1.34 06/10/2011 0904   CALCIUM 9.3 06/10/2011 0904   GFRNONAA 40 (L) 06/08/2011 1020   GFRAA 48 (L) 06/08/2011 1020    BNP No results found for: BNP  ProBNP    Component Value Date/Time   PROBNP 459.50 (H) 04/21/2016 1237      Assessment & Plan:   COPD (chronic obstructive pulmonary disease) with emphysema (HCC) Plan: Continue: Symbicort 160, Spiriva Respimat 2.5, Daliresp, azithromycin 250 mg tablet Referral to pharmacy team to help with cost of medications Continue oxygen therapy Chest x-ray today Lab work today Start doxycycline today, hold azithromycin during that time  Consider prednisone if patient's lab work and chest x-ray are stable Close follow-up with our office in 6 weeks, needs EKG at that time Patient to resume physical therapy when able, if patient decides that he would like to be evaluated for home physical therapy he is to contact our office so we can place the order   Chronic respiratory failure with hypoxia ([place]) Plan: Continue oxygen therapy 4 L -6 L Notify our office if oxygen levels are dropping below 88%  Dyspnea Worsening dyspnea on exertion Weight increased today Lower extremity swelling Probable abdominal edema  Plan: Lab work today Chest x-ray today Likely will need to increase diuretics and have close follow-up with cardiology  Edema Plan: Continue to weigh yourself daily Continue Lasix as prescribed Continue to elevate legs able Continue compression  stockings  Thrush, oral Plan: Nystatin today  Medication management Patient is struggling with cost of medications Patient requesting samples today  Plan: Referral to pharmacy team and pulmonary clinic Patient likely would benefit from in person pharmacy consultation at follow-up office visit  Therapeutic drug monitoring Plan: EKG at next office visit to follow patient's daily azithromycin regimen    Return in about 6 weeks (around 07/25/2019), or if symptoms worsen or fail to improve, for Follow up with [name]-C, Follow up with Dr. Lake Bells.   Lauraine Rinne, NP 06/13/2019   This appointment was 44 minutes long with over 50% of the time in direct face-to-face patient care, assessment, plan of care, and follow-up.

## 2019-06-13 ENCOUNTER — Encounter (HOSPITAL_COMMUNITY): Payer: Self-pay

## 2019-06-13 ENCOUNTER — Other Ambulatory Visit: Payer: Self-pay

## 2019-06-13 ENCOUNTER — Encounter: Payer: Self-pay | Admitting: Pulmonary Disease

## 2019-06-13 ENCOUNTER — Ambulatory Visit (INDEPENDENT_AMBULATORY_CARE_PROVIDER_SITE_OTHER): Payer: Medicare Other

## 2019-06-13 ENCOUNTER — Ambulatory Visit (INDEPENDENT_AMBULATORY_CARE_PROVIDER_SITE_OTHER): Payer: Medicare Other | Admitting: Pulmonary Disease

## 2019-06-13 VITALS — BP 124/72 | HR 67 | Temp 98.2°F | Ht 74.0 in | Wt 305.6 lb

## 2019-06-13 DIAGNOSIS — Z5181 Encounter for therapeutic drug level monitoring: Secondary | ICD-10-CM | POA: Diagnosis not present

## 2019-06-13 DIAGNOSIS — J432 Centrilobular emphysema: Secondary | ICD-10-CM | POA: Diagnosis not present

## 2019-06-13 DIAGNOSIS — J9611 Chronic respiratory failure with hypoxia: Secondary | ICD-10-CM

## 2019-06-13 DIAGNOSIS — R609 Edema, unspecified: Secondary | ICD-10-CM

## 2019-06-13 DIAGNOSIS — B370 Candidal stomatitis: Secondary | ICD-10-CM

## 2019-06-13 DIAGNOSIS — R0609 Other forms of dyspnea: Secondary | ICD-10-CM

## 2019-06-13 DIAGNOSIS — R0602 Shortness of breath: Secondary | ICD-10-CM | POA: Diagnosis not present

## 2019-06-13 DIAGNOSIS — Z79899 Other long term (current) drug therapy: Secondary | ICD-10-CM | POA: Diagnosis not present

## 2019-06-13 LAB — COMPREHENSIVE METABOLIC PANEL
ALT: 26 U/L (ref 0–53)
AST: 22 U/L (ref 0–37)
Albumin: 4.6 g/dL (ref 3.5–5.2)
Alkaline Phosphatase: 100 U/L (ref 39–117)
BUN: 21 mg/dL (ref 6–23)
CO2: 39 mEq/L — ABNORMAL HIGH (ref 19–32)
Calcium: 10 mg/dL (ref 8.4–10.5)
Chloride: 98 mEq/L (ref 96–112)
Creatinine, Ser: 1.31 mg/dL (ref 0.40–1.50)
GFR: 53.67 mL/min — ABNORMAL LOW (ref >60.00)
Glucose, Bld: 92 mg/dL (ref 70–99)
Potassium: 3.9 mEq/L (ref 3.5–5.1)
Sodium: 141 mEq/L (ref 135–145)
Total Bilirubin: 0.8 mg/dL (ref 0.2–1.2)
Total Protein: 7.6 g/dL (ref 6.0–8.3)

## 2019-06-13 LAB — CBC WITH DIFFERENTIAL/PLATELET
Basophils Absolute: 0.1 10*3/uL (ref 0.0–0.1)
Basophils Relative: 0.7 % (ref 0.0–3.0)
Eosinophils Absolute: 0.3 10*3/uL (ref 0.0–0.7)
Eosinophils Relative: 3 % (ref 0.0–5.0)
HCT: 46 % (ref 39.0–52.0)
Hemoglobin: 15.5 g/dL (ref 13.0–17.0)
Lymphocytes Relative: 18.9 % (ref 12.0–46.0)
Lymphs Abs: 1.7 10*3/uL (ref 0.7–4.0)
MCHC: 33.8 g/dL (ref 30.0–36.0)
MCV: 85.6 fl (ref 78.0–100.0)
Monocytes Absolute: 0.9 10*3/uL (ref 0.1–1.0)
Monocytes Relative: 9.6 % (ref 3.0–12.0)
Neutro Abs: 6.1 10*3/uL (ref 1.4–7.7)
Neutrophils Relative %: 67.8 % (ref 43.0–77.0)
Platelets: 187 10*3/uL (ref 150.0–400.0)
RBC: 5.37 Mil/uL (ref 4.22–5.81)
RDW: 14.2 % (ref 11.5–15.5)
WBC: 9.1 10*3/uL (ref 4.0–10.5)

## 2019-06-13 MED ORDER — NYSTATIN 100000 UNIT/ML MT SUSP: 5.0000 mL | Freq: Four times a day (QID) | OROMUCOSAL | 0 refills | Status: DC

## 2019-06-13 MED ORDER — SPIRIVA RESPIMAT 2.5 MCG/ACT IN AERS: 2.0000 | INHALATION_SPRAY | Freq: Every day | RESPIRATORY_TRACT | 0 refills | Status: DC

## 2019-06-13 MED ORDER — BUDESONIDE-FORMOTEROL FUMARATE 160-4.5 MCG/ACT IN AERO: 2.0000 | INHALATION_SPRAY | Freq: Two times a day (BID) | RESPIRATORY_TRACT | 0 refills | Status: DC

## 2019-06-13 MED ORDER — DOXYCYCLINE HYCLATE 100 MG PO TABS: 100.0000 mg | ORAL_TABLET | Freq: Two times a day (BID) | ORAL | 0 refills | Status: DC

## 2019-06-13 NOTE — Assessment & Plan Note (Addendum)
Plan: Continue: Symbicort 160, Spiriva Respimat 2.5, Daliresp, azithromycin 250 mg tablet Referral to pharmacy team to help with cost of medications Continue oxygen therapy Chest x-ray today Lab work today Start doxycycline today, hold azithromycin during that time Consider prednisone if patient's lab work and chest x-ray are stable Close follow-up with our office in 6 weeks, needs EKG at that time Patient to resume physical therapy when able, if patient decides that he would like to be evaluated for home physical therapy he is to contact our office so we can place the order

## 2019-06-13 NOTE — Assessment & Plan Note (Signed)
Plan: Continue to weigh yourself daily Continue Lasix as prescribed Continue to elevate legs able Continue compression stockings

## 2019-06-13 NOTE — Assessment & Plan Note (Signed)
Worsening dyspnea on exertion Weight increased today Lower extremity swelling Probable abdominal edema  Plan: Lab work today Chest x-ray today Likely will need to increase diuretics and have close follow-up with cardiology

## 2019-06-13 NOTE — Assessment & Plan Note (Signed)
Plan: Continue oxygen therapy 4 L -6 L Notify our office if oxygen levels are dropping below 88%

## 2019-06-13 NOTE — Patient Instructions (Addendum)
Chest Xray   Labwork   Doxycycline >>> 1 100 mg tablet every 12 hours for 7 days >>>take with food  >>>wear sunscreen  >>>hold daily azithromycin when taking doxycycline  Nystatin 500,000 units suspension /100,000 units/mL >>>5 mL's every 6 hours for 7 days >>>Try to retain nystatin in mouth as long as possible  Continue Symbicort 160 >>> 2 puffs in the morning right when you wake up, rinse out your mouth after use, 12 hours later 2 puffs, rinse after use >>> Take this daily, no matter what >>> This is not a rescue inhaler   Continue Spiriva Respimat 2.5 >>> 2 puffs daily >>> Do this every day >>>This is not a rescue inhaler  Continue Daliresp   Continue azithromycin 250mg  daily   Continue oxygen therapy as prescribed  >>>maintain oxygen saturations greater than 88 percent  >>>if unable to maintain oxygen saturations please contact the office  >>>do not smoke with oxygen  >>>can use nasal saline gel or nasal saline rinses to moisturize nose if oxygen causes dryness   Note your daily symptoms > remember "red flags" for COPD:   >>>Increase in cough >>>increase in sputum production >>>increase in shortness of breath or activity  intolerance.   If you notice these symptoms, please call the office to be seen.    Referral to [facility] Pulmonary Pharmacy Team for med costs    Return in about 6 weeks (around 07/25/2019), or if symptoms worsen or fail to improve, for Follow up with [name]-C, Follow up with Dr. Lake Bells.    Coronavirus (COVID-19) Are you at risk?  Are you at risk for the Coronavirus (COVID-19)?  To be considered HIGH RISK for Coronavirus (COVID-19), you have to meet the following criteria:  . Traveled to [place], [place], [place], [place] or [place]; or in the [place] to [place], [place], [place], or [place]; and have fever, cough, and shortness of breath within the last 2 weeks of travel OR . Been in close contact with a person  diagnosed with COVID-19 within the last 2 weeks and have fever, cough, and shortness of breath . IF YOU DO NOT MEET THESE CRITERIA, YOU ARE CONSIDERED LOW RISK FOR COVID-19.  What to do if you are HIGH RISK for COVID-19?  Marland Kitchen If you are having a medical emergency, call 911. . Seek medical care right away. Before you go to a doctor's office, urgent care or emergency department, call ahead and tell them about your recent travel, contact with someone diagnosed with COVID-19, and your symptoms. You should receive instructions from your physician's office regarding next steps of care.  . When you arrive at healthcare provider, tell the healthcare staff immediately you have returned from visiting [place], [place], [place], [place] or [place]; or traveled in the [place] to [place], [place], [place], or [place]; in the last two weeks or you have been in close contact with a person diagnosed with COVID-19 in the last 2 weeks.   . Tell the health care staff about your symptoms: fever, cough and shortness of breath. . After you have been seen by a medical provider, you will be either: o Tested for (COVID-19) and discharged home on quarantine except to seek medical care if symptoms worsen, and asked to  - Stay home and avoid contact with others until you get your results (4-5 days)  - Avoid travel on public transportation if possible (such as bus, train, or airplane) or o Sent to  the Emergency Department by EMS for evaluation, COVID-19 testing, and possible admission depending on your condition and test results.  What to do if you are LOW RISK for COVID-19?  Reduce your risk of any infection by using the same precautions used for avoiding the common cold or flu:  Marland Kitchen Wash your hands often with soap and warm water for at least 20 seconds.  If soap and water are not readily available, use an alcohol-based hand sanitizer with at least 60% alcohol.  . If coughing or sneezing, cover your mouth and nose by  coughing or sneezing into the elbow areas of your shirt or coat, into a tissue or into your sleeve (not your hands). . Avoid shaking hands with others and consider head nods or verbal greetings only. . Avoid touching your eyes, nose, or mouth with unwashed hands.  . Avoid close contact with people who are sick. . Avoid places or events with large numbers of people in one location, like concerts or sporting events. . Carefully consider travel plans you have or are making. . If you are planning any travel outside or inside the [place], visit the CDC's Travelers' Health webpage for the latest health notices. . If you have some symptoms but not all symptoms, continue to monitor at home and seek medical attention if your symptoms worsen. . If you are having a medical emergency, call 911.   [place] / e-Visit: [email or web address]         [facility]: [facility]: [phone]                   [facility]: [phone]           It is flu season:   >>> Best ways to protect herself from the flu: Receive the yearly flu vaccine, practice good hand hygiene washing with soap and also using hand sanitizer when available, eat a nutritious meals, get adequate rest, hydrate appropriately   Please contact the office if your symptoms worsen or you have concerns that you are not improving.   Thank you for choosing [facility] for your healthcare, and for allowing [place] to partner with you on your healthcare journey. I am thankful to be able to provide care to you today.   [name]-C

## 2019-06-13 NOTE — Progress Notes (Signed)
There is no pleural effusion.  This is good news.  Go ahead and start doxycycline the antibiotic that was prescribed today, remember to hold your azithromycin while you are taking doxycycline.  We will need to do an EKG at next office visit due to the daily azithromycin use.  We will contact you when we have your lab results.  Wyn Quaker, FNP

## 2019-06-13 NOTE — Assessment & Plan Note (Signed)
Patient is struggling with cost of medications Patient requesting samples today  Plan: Referral to pharmacy team and pulmonary clinic Patient likely would benefit from in person pharmacy consultation at follow-up office visit

## 2019-06-13 NOTE — Assessment & Plan Note (Signed)
Plan: EKG at next office visit to follow patient's daily azithromycin regimen

## 2019-06-13 NOTE — Addendum Note (Signed)
Addended by: Hildred Alamin I on: 06/13/2019 01:41 PM   Modules accepted: Orders

## 2019-06-13 NOTE — Assessment & Plan Note (Signed)
Plan: Nystatin today

## 2019-06-14 ENCOUNTER — Other Ambulatory Visit: Payer: Self-pay

## 2019-06-14 LAB — PRO B NATRIURETIC PEPTIDE: NT-Pro BNP: 71 pg/mL (ref 0–376)

## 2019-06-14 MED ORDER — PREDNISONE 10 MG PO TABS: ORAL_TABLET | ORAL | 0 refills | Status: DC

## 2019-06-14 NOTE — Progress Notes (Signed)
Please contact the patient let him know his lab work is stable.  His proBNP which could be a marker for showing that he is retaining fluid was normal at 71.  This is great news.  Kidney functioning is slightly reduced with a GFR of 53.  I will not increase his fluid pills based off of this. No changes from previous plan of care.   Can offer:   Prednisone 10mg  tablet  >>>4 tabs for 2 days, then 3 tabs for 2 days, 2 tabs for 2 days, then 1 tab for 2 days, then stop >>>take with food  >>>take in the morning   Please place order and send to pharmacy of choice.   Please route all of these results to his cardiologist Dr. Einar Gip.  [name]

## 2019-06-15 NOTE — Progress Notes (Signed)
Reviewed, agree 

## 2019-06-16 ENCOUNTER — Other Ambulatory Visit: Payer: Self-pay

## 2019-06-16 DIAGNOSIS — M179 Osteoarthritis of knee, unspecified: Secondary | ICD-10-CM

## 2019-06-16 DIAGNOSIS — M1710 Unilateral primary osteoarthritis, unspecified knee: Secondary | ICD-10-CM

## 2019-06-16 MED ORDER — TRAMADOL HCL 50 MG PO TABS: 50.0000 mg | ORAL_TABLET | Freq: Three times a day (TID) | ORAL | 0 refills | Status: DC | PRN

## 2019-06-21 ENCOUNTER — Ambulatory Visit: Payer: Medicare Other

## 2019-06-23 ENCOUNTER — Telehealth: Payer: Self-pay | Admitting: Pharmacy Technician

## 2019-06-23 NOTE — Telephone Encounter (Signed)
Left patient a voicemail to call back to schedule visit with the pharmacy team.   11:22 AM Beatriz Chancellor, CPhT

## 2019-06-28 NOTE — Telephone Encounter (Signed)
Patient scheduled for dual visit with Wyn Quaker, FNP on 08/02/2019 at [facility].

## 2019-07-07 ENCOUNTER — Other Ambulatory Visit: Payer: Self-pay | Admitting: Acute Care

## 2019-07-07 DIAGNOSIS — J432 Centrilobular emphysema: Secondary | ICD-10-CM

## 2019-07-21 ENCOUNTER — Ambulatory Visit (INDEPENDENT_AMBULATORY_CARE_PROVIDER_SITE_OTHER): Payer: Medicare Other | Admitting: Cardiology

## 2019-07-21 ENCOUNTER — Encounter: Payer: Self-pay | Admitting: Cardiology

## 2019-07-21 ENCOUNTER — Other Ambulatory Visit: Payer: Self-pay

## 2019-07-21 VITALS — BP 132/55 | HR 52 | Temp 98.4°F | Ht 74.0 in | Wt 305.0 lb

## 2019-07-21 DIAGNOSIS — N183 Chronic kidney disease, stage 3 unspecified: Secondary | ICD-10-CM

## 2019-07-21 DIAGNOSIS — I10 Essential (primary) hypertension: Secondary | ICD-10-CM

## 2019-07-21 DIAGNOSIS — I129 Hypertensive chronic kidney disease with stage 1 through stage 4 chronic kidney disease, or unspecified chronic kidney disease: Secondary | ICD-10-CM

## 2019-07-21 DIAGNOSIS — I5033 Acute on chronic diastolic (congestive) heart failure: Secondary | ICD-10-CM

## 2019-07-21 DIAGNOSIS — R0609 Other forms of dyspnea: Secondary | ICD-10-CM | POA: Diagnosis not present

## 2019-07-21 DIAGNOSIS — J9611 Chronic respiratory failure with hypoxia: Secondary | ICD-10-CM

## 2019-07-21 DIAGNOSIS — Z905 Acquired absence of kidney: Secondary | ICD-10-CM

## 2019-07-21 DIAGNOSIS — I480 Paroxysmal atrial fibrillation: Secondary | ICD-10-CM

## 2019-07-21 MED ORDER — FUROSEMIDE 40 MG PO TABS: 40.0000 mg | ORAL_TABLET | Freq: Two times a day (BID) | ORAL | 3 refills | Status: DC

## 2019-07-21 MED ORDER — POTASSIUM CHLORIDE ER 10 MEQ PO TBCR: 10.0000 meq | EXTENDED_RELEASE_TABLET | Freq: Every day | ORAL | 3 refills | Status: DC

## 2019-07-21 NOTE — Progress Notes (Signed)
Primary Physician/Referring:  Alroy Dust, L.[name], MD  Patient ID: Harold Hall, male    DOB: [birth year], 73 y.o.   MRN: [id number]  Chief Complaint  Patient presents with  . Shortness of Breath  . Fatigue  . Edema  . Acute Visit   HPI:    Harold Hall  is a 73 y.o. Caucasian male  with HTN, COPD on home O2 (recently increased to 6L), and HLD, paroxysmal A. Fibrillation presents for urgent visit due to worsening dyspnea and leg edema sent in by his PCP.  For the past one month, patient has noticed gradual worsening dyspnea, Worsening leg edema.  Denies chest pain.  Has been maintaing sinus rhythm since being on flecanide in June 2017. He does follow Dr. Azalee Course from pulmonary standpoint and management of COPD, underwent evaluation for lung transplantation at [facility] and also underwent coronary angiography since his last office visit, angiography on 09/21/2018 had revealed no significant coronary disease.  He was rejected for transplant. Now on 4-6 L of oxygen.   Past Medical History:  Diagnosis Date  . Allergic rhinitis   . Arthritis   . Cancer [facility])    left renal -solitary kidney  . COPD (chronic obstructive pulmonary disease) ([place])   . Erectile dysfunction   . Gout   . Hernia   . History of kidney cancer   . Hypercholesterolemia   . Hypertension   . RLS (restless legs syndrome)   . Sinus problem    Past Surgical History:  Procedure Laterality Date  . CARDIAC CATHETERIZATION    . HERNIA REPAIR  06/04/11  . left kidney removed  1994   Social History   Socioeconomic History  . Marital status: Married    Spouse name: Not on file  . Number of children: 1  . Years of education: Not on file  . Highest education level: Not on file  Occupational History  . Not on file  Social Needs  . Financial resource strain: Not on file  . Food insecurity    Worry: Not on file    Inability: Not on file  . Transportation needs    Medical: Not on file   Non-medical: Not on file  Tobacco Use  . Smoking status: Former Smoker    Packs/day: 1.50    Years: 50.00    Pack years: 75.00    Types: Cigarettes    Quit date: 06/04/2011    Years since quitting: 8.1  . Smokeless tobacco: Never Used  Substance and Sexual Activity  . Alcohol use: Yes    Alcohol/week: 2.0 - 3.0 standard drinks    Types: 2 - 3 Cans of beer per week    Comment: beers  . Drug use: No  . Sexual activity: Not on file  Lifestyle  . Physical activity    Days per week: Not on file    Minutes per session: Not on file  . Stress: Not on file  Relationships  . Social Herbalist on phone: Not on file    Gets together: Not on file    Attends religious service: Not on file    Active member of club or organization: Not on file    Attends meetings of clubs or organizations: Not on file    Relationship status: Not on file  . Intimate partner violence    Fear of current or ex partner: Not on file    Emotionally abused: Not on file  Physically abused: Not on file    Forced sexual activity: Not on file  Other Topics Concern  . Not on file  Social History Narrative  . Not on file   ROS  Review of Systems  Constitution: Positive for malaise/fatigue. Negative for chills, decreased appetite and weight gain.  Cardiovascular: Positive for leg swelling. Negative for dyspnea on exertion and syncope.  Respiratory: Positive for shortness of breath, snoring and wheezing.   Endocrine: Negative for cold intolerance.  Hematologic/Lymphatic: Does not bruise/bleed easily.  Musculoskeletal: Negative for joint swelling.  Gastrointestinal: Negative for abdominal pain, anorexia, change in bowel habit, hematochezia and melena.  Neurological: Negative for headaches and light-headedness.  Psychiatric/Behavioral: Negative for depression and substance abuse.  All other systems reviewed and are negative.  Objective   Vitals with BMI 07/21/2019 06/13/2019 01/25/2019  Height 6\' 2"  6'  2" 6\' 2"   Weight 305 lbs 305 lbs 10 oz 290 lbs 5 oz  BMI 39.14 [id number]  Systolic [id number]  Diastolic 55 72 60  Pulse 52 67 65    Blood pressure (!) 132/55, pulse (!) 52, temperature 98.4 F (36.9 C), height 6\' 2"  (1.88 m), weight (!) 305 lb (138.3 kg), SpO2 90 %. Body mass index is 39.16 kg/m.   Physical Exam  Constitutional: He appears well-developed. No distress.  Morbidly obese. On nasal cannula O2.  HENT:  Head: Atraumatic.  Eyes: Conjunctivae are normal.  Neck: Neck supple. No thyromegaly present.  Short neck and difficult to evaluate JVP  Cardiovascular: Normal rate, regular rhythm and normal heart sounds. Exam reveals no gallop.  No murmur heard. Pulses:      Carotid pulses are 2+ on the right side with bruit and 2+ on the left side with bruit.      Dorsalis pedis pulses are 1+ on the right side and 1+ on the left side.       Posterior tibial pulses are 0 on the right side and 0 on the left side.  Femoral and popliteal pulse difficult to feel due to patient's body habitus.   2 plus bilateral pitting leg edema. JVD difficult to make out due to obesity.   Pulmonary/Chest: Effort normal. He has wheezes (faint bilateral expiratory wheezing).  Abdominal: Soft. Bowel sounds are normal.  Obese. Pannus present  Musculoskeletal: Normal range of motion.        General: No edema.  Neurological: He is alert.  Skin: Skin is warm and dry.  Psychiatric: He has a normal mood and affect.   Radiology: No results found.  Laboratory examination:    Recent Labs    06/13/19 1230  NA 141  K 3.9  CL 98  CO2 39*  GLUCOSE 92  BUN 21  CREATININE 1.31  CALCIUM 10.0   CMP Latest Ref Rng & Units 06/13/2019 06/10/2011 06/08/2011  Glucose 70 - 99 mg/dL 92 118(H) 150(H)  BUN 6 - 23 mg/dL 21 22 29(H)  Creatinine 0.40 - 1.50 mg/dL 1.31 1.34 1.74(H)  Sodium 135 - 145 mEq/L 141 143 138  Potassium 3.5 - 5.1 mEq/L 3.9 4.1 3.8  Chloride 96 - 112 mEq/L 98 101 102  CO2 19 - 32 mEq/L  39(H) 31 29  Calcium 8.4 - 10.5 mg/dL 10.0 9.3 8.9  Total Protein 6.0 - 8.3 g/dL 7.6 - -  Total Bilirubin 0.2 - 1.2 mg/dL 0.8 - -  Alkaline Phos 39 - 117 U/L 100 - -  AST 0 - 37 U/L 22 - -  ALT  0 - 53 U/L 26 - -   CBC Latest Ref Rng & Units 06/13/2019 10/06/2016 06/10/2011  WBC 4.0 - 10.5 K/uL 9.1 8.4 7.9  Hemoglobin 13.0 - 17.0 g/dL 15.5 16.8 11.2(L)  Hematocrit 39.0 - 52.0 % 46.0 49.6 33.7(L)  Platelets 150.0 - 400.0 K/uL 187.0 190.0 219   Lipid Panel  Labs 09/21/2018:  Total cholesterol 140, triglycerides 134, HDL 40, LDL 68.1. No results found for: CHOL, TRIG, HDL, CHOLHDL, VLDL, LDLCALC, LDLDIRECT   HEMOGLOBIN A1C No results found for: HGBA1C, MPG TSH No results for input(s): TSH in the last 8760 hours. Medications   Prior to Admission medications   Medication Sig Start Date End Date Taking? Authorizing Provider  albuterol (PROAIR HFA) 108 (90 Base) MCG/ACT inhaler INHALE 2 PUFFS BY MOUTH INTO THE LUNGS EVERY 4 HOURS AS NEEDED FOR WHEEZING OR SHORTNESS OF BREATH 12/14/18   Juanito Doom, MD  allopurinol (ZYLOPRIM) 300 MG tablet Take 300 mg by mouth daily.    [provider]  ALPRAZolam Duanne Moron) 0.5 MG tablet Take 1 tablet (0.5 mg total) by mouth 2 (two) times daily. Patient taking differently: Take 0.5 mg by mouth at bedtime.  08/24/14   Kathee Delton, MD  amLODipine (NORVASC) 10 MG tablet Take 10 mg by mouth daily.     [provider]  atorvastatin (LIPITOR) 20 MG tablet Take 20 mg by mouth daily.    [provider]  azelastine (ASTELIN) 0.1 % nasal spray Place 1 spray into both nostrils 2 (two) times daily. Once daily 03/20/15   [provider]  azithromycin (ZITHROMAX) 250 MG tablet TAKE 1 TABLET EVERY DAY WITH FOOD 07/13/19   Lauraine Rinne, NP  budesonide-formoterol (SYMBICORT) 160-4.5 MCG/ACT inhaler INHALE 2 PUFFS BY MOUTH INTO THE LUNGS TWICE A DAY 12/14/18   Juanito Doom, MD  budesonide-formoterol (SYMBICORT) 160-4.5 MCG/ACT inhaler  Inhale 2 puffs into the lungs 2 (two) times daily. 06/13/19   Lauraine Rinne, NP  doxycycline (VIBRA-TABS) 100 MG tablet Take 1 tablet (100 mg total) by mouth 2 (two) times daily. 06/13/19   Lauraine Rinne, NP  ELIQUIS 5 MG TABS tablet Take 5 mg by mouth 2 (two) times daily. 05/07/16   [provider]  fish oil-omega-3 fatty acids 1000 MG capsule Take 1 capsule by mouth 3 (three) times daily.      [provider]  flecainide (TAMBOCOR) 100 MG tablet Take 100 mg by mouth 2 (two) times daily. 05/20/16   [provider]  furosemide (LASIX) 40 MG tablet Take 40 mg by mouth daily.     [provider]  Linaclotide Rolan Lipa) 145 MCG CAPS capsule Take 145 mcg by mouth daily.    [provider]  losartan-hydrochlorothiazide Konrad Penta) 100-25 MG tablet  05/16/18   [provider]  metoprolol succinate (TOPROL-XL) 50 MG 24 hr tablet Take 50 mg by mouth daily. Take with or immediately following a meal.    [provider]  montelukast (SINGULAIR) 10 MG tablet TAKE 1 TABLET AT BEDTIME 05/07/19   Juanito Doom, MD  Multiple Vitamin (MULITIVITAMIN WITH MINERALS) TABS Take 1 tablet by mouth daily.    [provider]  neomycin-polymyxin b-dexamethasone (MAXITROL) 3.5-10000-0.1 OINT Only as needed 06/17/18   [provider]  nitroGLYCERIN (NITROSTAT) 0.4 MG SL tablet Place 0.4 mg under the tongue every 5 (five) minutes as needed for chest pain.    [provider]  nystatin (MYCOSTATIN) 100000 UNIT/ML suspension Take 5 mLs (  500,000 Units total) by mouth 4 (four) times daily. 06/13/19   Lauraine Rinne, NP  potassium chloride (KLOR-CON) 20 MEQ packet Take 20 mEq by mouth daily. 08/05/16   Magdalen Spatz, NP  predniSONE (DELTASONE) 10 MG tablet Please take 4 tabs for 2 days, then 3 tabs for 2 days, then 2 tabs for 2 days, then 1 tab for 2 days, then stop. 06/14/19   Lauraine Rinne, NP  Respiratory Therapy Supplies (FLUTTER) DEVI Use as directed 05/10/18    Lauraine Rinne, NP  roflumilast (DALIRESP) 500 MCG TABS tablet Take 1 tablet (500 mcg total) by mouth daily. 12/02/18   Juanito Doom, MD  rOPINIRole (REQUIP) 0.5 MG tablet Take 1 tablet (0.5 mg total) by mouth daily. 04/05/13   Clance, Armando Reichert, MD  temazepam (RESTORIL) 15 MG capsule Take 15 mg by mouth at bedtime as needed for sleep.     [provider]  Tiotropium Bromide Monohydrate (SPIRIVA RESPIMAT) 2.5 MCG/ACT AERS Inhale 2 puffs into the lungs daily. 12/02/18   Juanito Doom, MD  Tiotropium Bromide Monohydrate (SPIRIVA RESPIMAT) 2.5 MCG/ACT AERS Inhale 2 puffs into the lungs daily. 06/13/19   Lauraine Rinne, NP  traMADol (ULTRAM) 50 MG tablet Take 1 tablet (50 mg total) by mouth 3 (three) times daily as needed for moderate pain. 06/16/19   Adrian Prows, MD  vitamin C (ASCORBIC ACID) 500 MG tablet Take 500 mg by mouth daily.    [provider]     Current Outpatient Medications  Medication Instructions  . albuterol (PROAIR HFA) 108 (90 Base) MCG/ACT inhaler INHALE 2 PUFFS BY MOUTH INTO THE LUNGS EVERY 4 HOURS AS NEEDED FOR WHEEZING OR SHORTNESS OF BREATH  . allopurinol (ZYLOPRIM) 300 mg, Daily  . ALPRAZolam (XANAX) 0.5 mg, Oral, 2 times daily  . amLODipine (NORVASC) 10 mg, Daily  . atorvastatin (LIPITOR) 20 mg, Oral, Daily  . azelastine (ASTELIN) 0.1 % nasal spray 1 spray, Each Nare, 2 times daily, Once daily  . azithromycin (ZITHROMAX) 250 MG tablet TAKE 1 TABLET EVERY DAY WITH FOOD  . budesonide-formoterol (SYMBICORT) 160-4.5 MCG/ACT inhaler 2 puffs, Inhalation, 2 times daily  . Eliquis 5 mg, Oral, 2 times daily  . fish oil-omega-3 fatty acids 1000 MG capsule 1 capsule, 3 times daily  . flecainide (TAMBOCOR) 100 mg, Oral, 2 times daily  . furosemide (LASIX) 40 mg, Daily  . linaclotide (LINZESS) 145 mcg, Oral, Every other day  . losartan-hydrochlorothiazide (HYZAAR) 100-25 MG tablet No dose, route, or frequency recorded.  . metoprolol succinate (TOPROL-XL) 50 mg,  Daily  . montelukast (SINGULAIR) 10 MG tablet TAKE 1 TABLET AT BEDTIME  . Multiple Vitamin (MULITIVITAMIN WITH MINERALS) TABS 1 tablet, Daily  . nitroGLYCERIN (NITROSTAT) 0.4 mg, Sublingual, Every 5 min PRN  . nystatin (MYCOSTATIN) 500,000 Units, Oral, 4 times daily  . potassium chloride (KLOR-CON) 20 MEQ packet 20 mEq, Oral, Daily  . Respiratory Therapy Supplies (FLUTTER) DEVI Use as directed  . roflumilast (DALIRESP) 500 mcg, Oral, Daily  . rOPINIRole (REQUIP) 0.5 mg, Oral, Daily  . temazepam (RESTORIL) 15 mg, At bedtime PRN  . Tiotropium Bromide Monohydrate (SPIRIVA RESPIMAT) 2.5 MCG/ACT AERS 2 puffs, Inhalation, Daily  . traMADol (ULTRAM) 50 mg, Oral, 3 times daily PRN  . vitamin C (ASCORBIC ACID) 500 mg, Oral, Daily   CT angio 05/22/2011: Mediastinal lymph nodes are not enlarged by CT size criteria. No hilar or axillary adenopathy. Atherosclerotic calcification of the arterial vasculature, including coronary arteries. Heart  size normal. Anterior pericardial fluid or thickening. Pulmonary arteries are enlarged. Biapical pleural parenchymal scarring, left greater than right. Emphysema. Focal mucoid impaction in the anterior segment right upper lobe (image 25). Lungs are otherwise clear. No pleural fluid. Scattered debris in the trachea.  Cardiac Studies:   Chest X-Ray 04/21/2016: 1. Stable cardiomegaly.  No pulmonary venous congestion. 2. Stable chronic interstitial changes consistent with interstitial fibrosis  Echocardiogram 05/07/2016: Left ventricle cavity is normal in size. Moderate concentric hypertrophy of the left ventricle. Hyperdynamic LV systolic function with cavitary obliteration. Suspect intraventricular pressure gradient. Doppler evidence of grade I (impaired) diastolic dysfunction. Visual EF is 65-70%. Left atrial cavity is severely dilated at 5.5cm. Moderate tricuspid regurgitation. Moderate pulmonary hypertension. Pulmonary artery systolic pressure is estimated at 80mm Hg.  Insignificant pericardial effusion. IVC is dilated with respiratory variation. This suggests elevated right heart pressure. Compared to the study done on 02/17/2014, LA size was mildly dilated and Pul HTN new.  Lexiscan myoview stress test 05/01/2016: 1. The resting electrocardiogram demonstrated atrial fibrillation, normal resting conduction and nonspecific ST-T changes. Stress EKG is non-diagnostic for ischemia as it a pharmacologic stress using Lexiscan. Stress symptoms included dyspnea. 2. The perfusion imaging study demonstrates mild diaphragmatic attenuation artifact in the inferior wall, there is no evidence of ischemia or scar. Left ventricle systolic function calculated by QGS was 86%. This is a low risk study. No significant change from 08/31/2014.  Carotid artery duplex 02/01/2018: No hemodynamically significant arterial disease in the internal carotid artery bilaterally. Right vertebral artery flow is not visualized. Left vertebral artery flow is not visualized. Compared to study done on 09/26/2014, left ICA stenosis of less than 50% not present. Previously vertebral arteries were well visualized.  Sleep Study [facility] 10/02/2018: Sleep Apnea mild. 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 Moderate pulmonary hypertension, mean pressure 32 mmHg.  Event Monitor 2 weeks 09/04/19 -09/09/2016: No symptoms reported. NSR.   Assessment     ICD-10-CM   1. Acute on chronic diastolic heart failure (HCC)  I50.33   2. Dyspnea on exertion  R06.09 EKG 12-Lead  3. Chronic respiratory failure with hypoxia (HCC)  J96.11   4. Paroxysmal atrial fibrillation (HCC)  I48.0    CHA2DS2-VASc Score is 3. Yearly risk of stroke: Score is 3.2%  5. Essential hypertension  I10   6. CKD (chronic kidney disease) stage 3, GFR 30-59 ml/min (HCC)  N18.3   7. Single kidney  Z90.5    Left  nephrectomy for renal cell carcinoma    EKG 07/21/2019: Marked sinus bradycardia at rate of 52 bpm with first-degree AV block, PR interval 380 ms.  Left axis deviation, left anterior fascicular block CANNOT rule out Inferior infarct old, anterolateral infarct old, pulmonary disease pattern.  IVCD, borderline criteria for LVH.  Abnormal EKG. No significant change from  EKG 01/25/2019: Sinus rhythm with first-degree AV block at rate of 66 bpm   Recommendations:   Patient was sent back to me on a stat basis for worsening shortness of breath, weight gain and leg edema.  He is presently in acute decompensated heart failure, mostly contributed by his poor eating habits.  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  I do not think bradycardia is contributing to his heart failure.  He is also on continuous home oxygen due to underlying severe COPD.  He is responding well  to b.i.d. dosing of furosemide, she is back on once a day dosing, does have underlying chronic kidney disease and a single kidney and obviously aggressive diuresis will affect his renal function.  I have discussed with him and his wife regarding restricting fluids while he is taking diuretics and also we discussed regarding making dietary changes with regard to heart failure.  I will repeat his echocardiogram and would like to monitor him closely, I'll see him back in 6 weeks.  He'll contact me if his symptoms don't improve. For monitoring of his renal function, I'll obtain BMP and also BNP in one week to 10 days.  I have refilled his furosemide and also gave potassium supplements to go along.  He seems to be motivated in losing weight and making dietary changes.  Without dietary changes, his heart failure symptoms were not improved.  He will contact me if symptoms do not improve over the next one week.  Do not think he needs hospital admission. "Total time spent with patient was  40  minutes and greater than 50% of that time was spent in face to face discussion, counseling and coordination care"   Adrian Prows, MD, [facility] 07/21/2019, 11:28 AM [facility] [place] Pager: [phone] Office: [phone] If no answer Cell [phone]

## 2019-07-25 ENCOUNTER — Ambulatory Visit: Payer: Medicare Other | Admitting: Pulmonary Disease

## 2019-08-01 NOTE — Progress Notes (Deleted)
   Drug-Drug interactions:   Azithromycin/Symbicort/Flecanide QTC prolongation QT 440 QTC 409  Azithromycin/Atorvastatin increased risk of Rhabdo

## 2019-08-02 ENCOUNTER — Other Ambulatory Visit: Payer: Self-pay

## 2019-08-02 ENCOUNTER — Encounter: Payer: Self-pay | Admitting: Primary Care

## 2019-08-02 ENCOUNTER — Ambulatory Visit: Payer: Medicare Other | Admitting: Pulmonary Disease

## 2019-08-02 ENCOUNTER — Ambulatory Visit: Payer: Medicare Other

## 2019-08-02 ENCOUNTER — Ambulatory Visit (INDEPENDENT_AMBULATORY_CARE_PROVIDER_SITE_OTHER): Payer: Medicare Other | Admitting: Primary Care

## 2019-08-02 VITALS — BP 116/60 | HR 64 | Ht 74.0 in | Wt 300.0 lb

## 2019-08-02 DIAGNOSIS — Z5181 Encounter for therapeutic drug level monitoring: Secondary | ICD-10-CM

## 2019-08-02 DIAGNOSIS — Z23 Encounter for immunization: Secondary | ICD-10-CM

## 2019-08-02 DIAGNOSIS — J9611 Chronic respiratory failure with hypoxia: Secondary | ICD-10-CM | POA: Diagnosis not present

## 2019-08-02 DIAGNOSIS — J449 Chronic obstructive pulmonary disease, unspecified: Secondary | ICD-10-CM

## 2019-08-02 MED ORDER — AZITHROMYCIN 250 MG PO TABS: ORAL_TABLET | ORAL | 3 refills | Status: DC

## 2019-08-02 NOTE — Patient Instructions (Addendum)
It was a pleasure meeting you today  COPD: Continue Symbicort 160 - take 2 puffs twice daily Continue Spiriva Respimat 2.54mcg-  take 2 puffs once daily Continue Daliresp 500mg  daily  Change azithromycin to 250 mg Monday/Wednesday/Friday  Office treatment: High dose influenza vaccine today   Follow-up: 3 months with Wyn Quaker or (needs to establish with new MD provider-former [facility] patient)    Influenza Virus Vaccine (Flucelvax) What is this medicine? INFLUENZA VIRUS VACCINE (in floo EN zuh VAHY ruhs vak SEEN) helps to reduce the risk of getting influenza also known as the flu. The vaccine only helps protect you against some strains of the flu. This medicine may be used for other purposes; ask your health care provider or pharmacist if you have questions. COMMON BRAND NAME(S): FLUCELVAX What should I tell my health care provider before I take this medicine? They need to know if you have any of these conditions:  bleeding disorder like hemophilia  fever or infection  Guillain-Barre syndrome or other neurological problems  immune system problems  infection with the human immunodeficiency virus (HIV) or AIDS  low blood platelet counts  multiple sclerosis  an unusual or allergic reaction to influenza virus vaccine, other medicines, foods, dyes or preservatives  pregnant or trying to get pregnant  breast-feeding How should I use this medicine? This vaccine is for injection into a muscle. It is given by a health care professional. A copy of Vaccine Information Statements will be given before each vaccination. Read this sheet carefully each time. The sheet may change frequently. Talk to your pediatrician regarding the use of this medicine in children. Special care may be needed. Overdosage: If you think you've taken too much of this medicine contact a poison control center or emergency room at once. Overdosage: If you think you have taken too much of this medicine contact a  poison control center or emergency room at once. NOTE: This medicine is only for you. Do not share this medicine with others. What if I miss a dose? This does not apply. What may interact with this medicine?  chemotherapy or radiation therapy  medicines that lower your immune system like etanercept, anakinra, infliximab, and adalimumab  medicines that treat or prevent blood clots like warfarin  phenytoin  steroid medicines like prednisone or cortisone  theophylline  vaccines This list may not describe all possible interactions. Give your health care provider a list of all the medicines, herbs, non-prescription drugs, or dietary supplements you use. Also tell them if you smoke, drink alcohol, or use illegal drugs. Some items may interact with your medicine. What should I watch for while using this medicine? Report any side effects that do not go away within 3 days to your doctor or health care professional. Call your health care provider if any unusual symptoms occur within 6 weeks of receiving this vaccine. You may still catch the flu, but the illness is not usually as bad. You cannot get the flu from the vaccine. The vaccine will not protect against colds or other illnesses that may cause fever. The vaccine is needed every year. What side effects may I notice from receiving this medicine? Side effects that you should report to your doctor or health care professional as soon as possible:  allergic reactions like skin rash, itching or hives, swelling of the face, lips, or tongue Side effects that usually do not require medical attention (Report these to your doctor or health care professional if they continue or are bothersome.):  fever  headache  muscle aches and pains  pain, tenderness, redness, or swelling at the injection site  tiredness This list may not describe all possible side effects. Call your doctor for medical advice about side effects. You may report side effects to  FDA at 1-800-FDA-1088. Where should I keep my medicine? The vaccine will be given by a health care professional in a clinic, pharmacy, doctor's office, or other health care setting. You will not be given vaccine doses to store at home. NOTE: This sheet is a summary. It may not cover all possible information. If you have questions about this medicine, talk to your doctor, pharmacist, or health care provider.  2020 Elsevier/Gold Standard (2011-10-07 14:06:47)

## 2019-08-02 NOTE — Assessment & Plan Note (Addendum)
-   Discussed medications with pharmacist on site - Azithromycin potential interaction with flecainide and atorvastatin  - EKG with cardiology showed sinus bradycardia rate 52; QT 440, QTc 409  - Change Azithromycin to MWF dosing from daily  - Hepatic function wnl in August

## 2019-08-02 NOTE — Assessment & Plan Note (Signed)
-   Continue 4-6L to keep O2 > 88-90%  - Using 4L at rest/nocturnal and 6L on exertion

## 2019-08-02 NOTE — Progress Notes (Signed)
Reviewed, agree 

## 2019-08-02 NOTE — Assessment & Plan Note (Addendum)
-   Stable interval  - Continue Symbicort 160 twice daily; Spiriva respimat 2.39mcg once daily - Continue Daliresp 500mg  daily - Change Azithromycin 250mg  to Monday, Wednesday, Friday (d/t potential interactions) - Received high dose influenza vaccine today - FU in 3 months with new [facility] pulmonary provider or Wyn Quaker, NP

## 2019-08-02 NOTE — Progress Notes (Signed)
@Patient  ID: Harold Hall, male    DOB: 1946/02/12, 73 y.o.   MRN: [id number]  Chief Complaint  Patient presents with   Follow-up    Sob slightly improved,can wear 4L some when not exerting,6L with exertion,dry cough    Referring provider: Alroy Dust, L.[name], MD  HPI: 73 year old male, former smoker quit in 2012 (75-pack-year history).  Past medical history significant for COPD with emphysema, chronic respiratory failure with hypoxia, pulmonary nodule, A. Fib, lower extremity edema.  Patient of Dr. Lake Bells, last seen by pulmonary nurse practitioner on 06/13/2019.  He is treated for COPD exacerbation with doxycycline. Chest x-ray showed no acute cardiopulmonary disease, stable chronic interstitial changes.  Maintained on Symbicort 160, Spiriva Respimat 2.5, Daliresp, azithromycin 250.  He is on continuous oxygen 4 to 6 L.  Needs EKG at next visit.  08/02/2019 Patient presents today for 6-week follow-up. He is doing well, states that he feels better than he did in August.  He has had no recent exacerbations since last visit requiring antibiotics or steroids.  He is currently on 6 L of oxygen; wears 6 L on exertion and 4 L at rest/night.  Continues Symbicort 160 and Spiriva respimat as prescribed.  Using his rescue inhaler 1-2 times a day but not every day. Discussed patient's medications with onsite pharmacist.  She states that the only thing noteworthy is that azithromycin can interact with flecainide and cause QT C prolongation, also azithromycin and atorvastatin can increase patient's risk for rhabdomylosis.  Patient had an EKG 2 weeks ago with cardiologist; QTc interval within normal limits.  Reports bilateral leg aching and rest less leg syndome.  He does sleep in a recliner is unsure if leg discomfort is from this.  He is trying to get more activity during the day.  Denies fever, productive cough, wheezing.  Due for flu vaccine today.  Tests:   Imaging: December 2017 chest CT images  showing significant emphysema with an upper lobe predominance, no clear interstitial lung disease.  Blood: 09/2016 IgE 4 08/15/2018 Hgb 16.9 mg/dL  08/15/2018 Radiopharmaceutical nuclear GFR 134.44 mL  Echo: 08/15/2018 LVEF > 55%, mod LVH, dilated left atrial, normal RV, no shunt  PFT: [facility] 2007:  FEV1 2.10 (46%), ratio 47 PFT 2017 Ratio 47%, FEV1 1.76L (46% pred), FVC 3.78L, TLC 6.45L (82% pred), DLCO 13.18  No Known Allergies  Immunization History  Administered Date(s) Administered   Fluad Quad(high Dose 65+) 08/02/2019   Influenza Split 08/13/2011, 08/21/2012, 08/17/2014, 08/24/2015   Influenza Whole 08/12/2009, 08/09/2010   Influenza, High Dose Seasonal PF 08/09/2013, 08/13/2016, 09/05/2017, 07/08/2018   Pneumococcal Conjugate-13 08/17/2014   Pneumococcal Polysaccharide-23 08/10/2007    Past Medical History:  Diagnosis Date   Allergic rhinitis    Arthritis    Cancer ([place])    left renal -solitary kidney   COPD (chronic obstructive pulmonary disease) (HCC)    Erectile dysfunction    Gout    Hernia    History of kidney cancer    Hypercholesterolemia    Hypertension    RLS (restless legs syndrome)    Sinus problem     Tobacco History: Social History   Tobacco Use  Smoking Status Former Smoker   Packs/day: 1.50   Years: 50.00   Pack years: 75.00   Types: Cigarettes   Quit date: 06/04/2011   Years since quitting: 8.1  Smokeless Tobacco Never Used   Counseling given: Not Answered   Outpatient Medications Prior to Visit  Medication Sig Dispense Refill  albuterol (PROAIR HFA) 108 (90 Base) MCG/ACT inhaler INHALE 2 PUFFS BY MOUTH INTO THE LUNGS EVERY 4 HOURS AS NEEDED FOR WHEEZING OR SHORTNESS OF BREATH 3 Inhaler 3   allopurinol (ZYLOPRIM) 300 MG tablet Take 300 mg by mouth daily.     ALPRAZolam (XANAX) 0.5 MG tablet Take 1 tablet (0.5 mg total) by mouth 2 (two) times daily. (Patient taking differently: Take 0.5 mg by mouth at  bedtime. ) 60 tablet 0   amLODipine (NORVASC) 10 MG tablet Take 10 mg by mouth daily.      atorvastatin (LIPITOR) 20 MG tablet Take 20 mg by mouth daily.     azelastine (ASTELIN) 0.1 % nasal spray Place 1 spray into both nostrils 2 (two) times daily. Once daily  2   budesonide-formoterol (SYMBICORT) 160-4.5 MCG/ACT inhaler Inhale 2 puffs into the lungs 2 (two) times daily. 1 Inhaler 0   ELIQUIS 5 MG TABS tablet Take 5 mg by mouth 2 (two) times daily.  0   fish oil-omega-3 fatty acids 1000 MG capsule Take 1 capsule by mouth 3 (three) times daily.       flecainide (TAMBOCOR) 100 MG tablet Take 100 mg by mouth 2 (two) times daily.  0   furosemide (LASIX) 40 MG tablet Take 1 tablet (40 mg total) by mouth 2 (two) times daily. Am and 3 pm 60 tablet 3   Linaclotide (LINZESS) 145 MCG CAPS capsule Take 145 mcg by mouth every other day.      losartan-hydrochlorothiazide (HYZAAR) 100-25 MG tablet 1 tablet daily.      metoprolol succinate (TOPROL-XL) 50 MG 24 hr tablet Take 50 mg by mouth daily. Take with or immediately following a meal.     montelukast (SINGULAIR) 10 MG tablet TAKE 1 TABLET AT BEDTIME 90 tablet 2   Multiple Vitamin (MULITIVITAMIN WITH MINERALS) TABS Take 1 tablet by mouth daily.     nitroGLYCERIN (NITROSTAT) 0.4 MG SL tablet Place 0.4 mg under the tongue every 5 (five) minutes as needed for chest pain.     nystatin (MYCOSTATIN) 100000 UNIT/ML suspension Take 5 mLs (500,000 Units total) by mouth 4 (four) times daily. 60 mL 0   potassium chloride (K-DUR) 10 MEQ tablet Take 1 tablet (10 mEq total) by mouth daily. 90 tablet 3   Respiratory Therapy Supplies (FLUTTER) DEVI Use as directed 1 each 0   roflumilast (DALIRESP) 500 MCG TABS tablet Take 1 tablet (500 mcg total) by mouth daily. 90 tablet 3   rOPINIRole (REQUIP) 0.5 MG tablet Take 1 tablet (0.5 mg total) by mouth daily. 30 tablet 0   temazepam (RESTORIL) 15 MG capsule Take 15 mg by mouth at bedtime as needed for sleep.       Tiotropium Bromide Monohydrate (SPIRIVA RESPIMAT) 2.5 MCG/ACT AERS Inhale 2 puffs into the lungs daily. 1 g 0   traMADol (ULTRAM) 50 MG tablet Take 1 tablet (50 mg total) by mouth 3 (three) times daily as needed for moderate pain. 90 tablet 0   vitamin C (ASCORBIC ACID) 500 MG tablet Take 500 mg by mouth daily.     azithromycin (ZITHROMAX) 250 MG tablet TAKE 1 TABLET EVERY DAY WITH FOOD 30 tablet 1   No facility-administered medications prior to visit.    Review of Systems  Review of Systems  Constitutional: Negative.   HENT: Negative.   Respiratory: Negative for cough, shortness of breath and wheezing.   Cardiovascular: Negative.    Physical Exam  BP 116/60 (BP Location: Left Arm, Cuff Size:  Normal)    Pulse 64    Ht 6\' 2"  (1.88 m)    Wt 300 lb (136.1 kg)    SpO2 94%    BMI 38.52 kg/m  Physical Exam Constitutional:      General: He is not in acute distress.    Appearance: Normal appearance. He is obese. He is not ill-appearing.  HENT:     Head: Normocephalic and atraumatic.     Mouth/Throat:     Mouth: Mucous membranes are moist.     Pharynx: Oropharynx is clear.  Neck:     Musculoskeletal: Normal range of motion and neck supple.  Cardiovascular:     Rate and Rhythm: Normal rate and regular rhythm.  Pulmonary:     Effort: Pulmonary effort is normal. No respiratory distress.     Breath sounds: No wheezing, rhonchi or rales.     Comments: Lungs clear, diminished bases Musculoskeletal: Normal range of motion.  Skin:    General: Skin is warm and dry.  Neurological:     General: No focal deficit present.     Mental Status: He is alert and oriented to person, place, and time. Mental status is at baseline.  Psychiatric:        Mood and Affect: Mood normal.        Behavior: Behavior normal.        Thought Content: Thought content normal.        Judgment: Judgment normal.      Lab Results:  CBC    Component Value Date/Time   WBC 9.1 06/13/2019 1230   RBC 5.37  06/13/2019 1230   HGB 15.5 06/13/2019 1230   HCT 46.0 06/13/2019 1230   PLT 187.0 06/13/2019 1230   MCV 85.6 06/13/2019 1230   MCH 28.8 06/10/2011 0904   MCHC 33.8 06/13/2019 1230   RDW 14.2 06/13/2019 1230   LYMPHSABS 1.7 06/13/2019 1230   MONOABS 0.9 06/13/2019 1230   EOSABS 0.3 06/13/2019 1230   BASOSABS 0.1 06/13/2019 1230    BMET    Component Value Date/Time   NA 141 06/13/2019 1230   K 3.9 06/13/2019 1230   CL 98 06/13/2019 1230   CO2 39 (H) 06/13/2019 1230   GLUCOSE 92 06/13/2019 1230   BUN 21 06/13/2019 1230   CREATININE 1.31 06/13/2019 1230   CREATININE 1.34 06/10/2011 0904   CALCIUM 10.0 06/13/2019 1230   GFRNONAA 40 (L) 06/08/2011 1020   GFRAA 48 (L) 06/08/2011 1020    BNP No results found for: BNP  ProBNP    Component Value Date/Time   PROBNP 71 06/13/2019 1230   PROBNP 459.50 (H) 04/21/2016 1237    Imaging: No results found.   Assessment & Plan:   COPD (chronic obstructive pulmonary disease) with emphysema (HCC) - Stable interval  - Continue Symbicort 160 twice daily; Spiriva respimat 2.2mcg once daily - Continue Daliresp 500mg  daily - Change Azithromycin 250mg  to Monday, Wednesday, Friday (d/t potential interactions) - Received high dose influenza vaccine today - FU in 3 months with new [facility] pulmonary provider or Wyn Quaker, NP   Therapeutic drug monitoring - Discussed medications with pharmacist on site - Azithromycin potential interaction with flecainide and atorvastatin  - EKG with cardiology showed sinus bradycardia rate 52; QT 440, QTc 409  - Change Azithromycin to MWF dosing from daily  - Hepatic function wnl in August   Chronic respiratory failure with hypoxia (HCC) - Continue 4-6L to keep O2 > 88-90%  - Using 4L at rest/nocturnal and  6L on exertion   Martyn Ehrich, NP 08/02/2019

## 2019-08-03 ENCOUNTER — Ambulatory Visit: Payer: Medicare Other | Admitting: Cardiology

## 2019-08-07 ENCOUNTER — Other Ambulatory Visit: Payer: Self-pay

## 2019-08-07 ENCOUNTER — Ambulatory Visit (INDEPENDENT_AMBULATORY_CARE_PROVIDER_SITE_OTHER): Payer: Medicare Other

## 2019-08-07 DIAGNOSIS — I5033 Acute on chronic diastolic (congestive) heart failure: Secondary | ICD-10-CM | POA: Diagnosis not present

## 2019-08-08 LAB — BASIC METABOLIC PANEL
BUN/Creatinine Ratio: 16 (ref 10–24)
BUN: 27 mg/dL (ref 8–27)
CO2: 32 mmol/L — ABNORMAL HIGH (ref 20–29)
Calcium: 10.1 mg/dL (ref 8.6–10.2)
Chloride: 100 mmol/L (ref 96–106)
Creatinine, Ser: 1.71 mg/dL — ABNORMAL HIGH (ref 0.76–1.27)
GFR calc Af Amer: 45 mL/min/{1.73_m2} — ABNORMAL LOW (ref >59)
GFR calc non Af Amer: 39 mL/min/{1.73_m2} — ABNORMAL LOW (ref >59)
Glucose: 106 mg/dL — ABNORMAL HIGH (ref 65–99)
Potassium: 4.2 mmol/L (ref 3.5–5.2)
Sodium: 146 mmol/L — ABNORMAL HIGH (ref 134–144)

## 2019-08-08 LAB — BRAIN NATRIURETIC PEPTIDE: BNP: 16 pg/mL (ref 0.0–100.0)

## 2019-09-04 DIAGNOSIS — M109 Gout, unspecified: Secondary | ICD-10-CM | POA: Diagnosis not present

## 2019-09-04 DIAGNOSIS — E669 Obesity, unspecified: Secondary | ICD-10-CM | POA: Diagnosis not present

## 2019-09-04 DIAGNOSIS — G2581 Restless legs syndrome: Secondary | ICD-10-CM | POA: Diagnosis not present

## 2019-09-04 DIAGNOSIS — N1831 Chronic kidney disease, stage 3a: Secondary | ICD-10-CM | POA: Diagnosis not present

## 2019-09-04 DIAGNOSIS — F411 Generalized anxiety disorder: Secondary | ICD-10-CM | POA: Diagnosis not present

## 2019-09-04 DIAGNOSIS — G4700 Insomnia, unspecified: Secondary | ICD-10-CM | POA: Diagnosis not present

## 2019-09-04 DIAGNOSIS — I10 Essential (primary) hypertension: Secondary | ICD-10-CM | POA: Diagnosis not present

## 2019-09-04 DIAGNOSIS — I4891 Unspecified atrial fibrillation: Secondary | ICD-10-CM | POA: Diagnosis not present

## 2019-09-04 DIAGNOSIS — K5900 Constipation, unspecified: Secondary | ICD-10-CM | POA: Diagnosis not present

## 2019-09-04 DIAGNOSIS — J449 Chronic obstructive pulmonary disease, unspecified: Secondary | ICD-10-CM | POA: Diagnosis not present

## 2019-09-04 DIAGNOSIS — E782 Mixed hyperlipidemia: Secondary | ICD-10-CM | POA: Diagnosis not present

## 2019-09-06 ENCOUNTER — Other Ambulatory Visit: Payer: Self-pay | Admitting: Pulmonary Disease

## 2019-09-08 ENCOUNTER — Encounter: Payer: Self-pay | Admitting: Cardiology

## 2019-09-08 ENCOUNTER — Other Ambulatory Visit: Payer: Self-pay

## 2019-09-08 ENCOUNTER — Ambulatory Visit (INDEPENDENT_AMBULATORY_CARE_PROVIDER_SITE_OTHER): Payer: Medicare Other | Admitting: Cardiology

## 2019-09-08 VITALS — BP 115/63 | HR 72 | Temp 98.4°F | Ht 74.0 in | Wt 295.3 lb

## 2019-09-08 DIAGNOSIS — I129 Hypertensive chronic kidney disease with stage 1 through stage 4 chronic kidney disease, or unspecified chronic kidney disease: Secondary | ICD-10-CM | POA: Diagnosis not present

## 2019-09-08 DIAGNOSIS — N1832 Chronic kidney disease, stage 3b: Secondary | ICD-10-CM | POA: Diagnosis not present

## 2019-09-08 DIAGNOSIS — I5032 Chronic diastolic (congestive) heart failure: Secondary | ICD-10-CM | POA: Diagnosis not present

## 2019-09-08 DIAGNOSIS — I5033 Acute on chronic diastolic (congestive) heart failure: Secondary | ICD-10-CM | POA: Diagnosis not present

## 2019-09-08 DIAGNOSIS — J9611 Chronic respiratory failure with hypoxia: Secondary | ICD-10-CM

## 2019-09-08 DIAGNOSIS — I452 Bifascicular block: Secondary | ICD-10-CM

## 2019-09-08 DIAGNOSIS — I10 Essential (primary) hypertension: Secondary | ICD-10-CM

## 2019-09-08 MED ORDER — POTASSIUM CHLORIDE ER 10 MEQ PO TBCR: 10.0000 meq | EXTENDED_RELEASE_TABLET | Freq: Two times a day (BID) | ORAL | 3 refills | Status: DC | PRN

## 2019-09-08 MED ORDER — FUROSEMIDE 40 MG PO TABS: 40.0000 mg | ORAL_TABLET | Freq: Two times a day (BID) | ORAL | 3 refills | Status: DC | PRN

## 2019-09-08 NOTE — Progress Notes (Signed)
Primary Physician/Referring:  Alroy Dust, L.[name], MD  Patient ID: [name], male    DOB: 1946-08-04, 73 y.o.   MRN: [id number]  Chief Complaint  Patient presents with  . Congestive Heart Failure  . Atrial Fibrillation  . Follow-up    6 week and echo results   HPI:    Harold Hall  is a 73 y.o. Caucasian male  with HTN, COPD on home O2 (recently increased to 6L), and HLD, paroxysmal A. Fibrillation maintaing sinus rhythm since being on flecanide in June 2017. He does follow Dr. Azalee Course from pulmonary standpoint and management of COPD, underwent evaluation for lung transplantation at [facility] and also underwent coronary angiography since his last office visit, angiography on 09/21/2018 had revealed no significant coronary disease.  He was rejected for transplant. Now on 4-6 L of oxygen.   He was seen by me 6 weeks ago for worsening shortness breath, weight gain, leg edema and abdominal discomfort.  He was started on b.i.d. dosing of furosemide which is tolerating, has lost 10 pounds in weight, dyspnea has improved, abdominal distention is improved.  Overall he is feeling well.  States that he has made lifestyle changes with regard to his diet.  Past Medical History:  Diagnosis Date  . Allergic rhinitis   . Arthritis   . Cancer [facility])    left renal -solitary kidney  . COPD (chronic obstructive pulmonary disease) ([place])   . Erectile dysfunction   . Gout   . Hernia   . History of kidney cancer   . Hypercholesterolemia   . Hypertension   . RLS (restless legs syndrome)   . Sinus problem    Past Surgical History:  Procedure Laterality Date  . CARDIAC CATHETERIZATION    . HERNIA REPAIR  06/04/11  . left kidney removed  1994   Social History   Socioeconomic History  . Marital status: Married    Spouse name: Not on file  . Number of children: 1  . Years of education: Not on file  . Highest education level: Not on file  Occupational History  . Not on  file  Social Needs  . Financial resource strain: Not on file  . Food insecurity    Worry: Not on file    Inability: Not on file  . Transportation needs    Medical: Not on file    Non-medical: Not on file  Tobacco Use  . Smoking status: Former Smoker    Packs/day: 1.50    Years: 50.00    Pack years: 75.00    Types: Cigarettes    Quit date: 06/04/2011    Years since quitting: 8.2  . Smokeless tobacco: Never Used  Substance and Sexual Activity  . Alcohol use: Yes    Alcohol/week: 2.0 - 3.0 standard drinks    Types: 2 - 3 Cans of beer per week    Comment: beers  . Drug use: No  . Sexual activity: Not on file  Lifestyle  . Physical activity    Days per week: Not on file    Minutes per session: Not on file  . Stress: Not on file  Relationships  . Social Herbalist on phone: Not on file    Gets together: Not on file    Attends religious service: Not on file    Active member of club or organization: Not on file    Attends meetings of clubs or organizations: Not on file  Relationship status: Not on file  . Intimate partner violence    Fear of current or ex partner: Not on file    Emotionally abused: Not on file    Physically abused: Not on file    Forced sexual activity: Not on file  Other Topics Concern  . Not on file  Social History Narrative  . Not on file   ROS  Review of Systems  Constitution: Negative for chills, decreased appetite, malaise/fatigue and weight gain.  Cardiovascular: Negative for dyspnea on exertion, leg swelling and syncope.  Respiratory: Positive for shortness of breath, snoring and wheezing.   Endocrine: Negative for cold intolerance.  Hematologic/Lymphatic: Does not bruise/bleed easily.  Musculoskeletal: Negative for joint swelling.  Gastrointestinal: Negative for abdominal pain, anorexia, change in bowel habit, hematochezia and melena.  Neurological: Negative for headaches and light-headedness.  Psychiatric/Behavioral: Negative for  depression and substance abuse.  All other systems reviewed and are negative.  Objective   Vitals with BMI 09/08/2019 08/02/2019 07/21/2019  Height 6\' 2"  6\' 2"  6\' 2"   Weight 295 lbs 5 oz 300 lbs 305 lbs  BMI 37.9 [id number]  Systolic [id number]  Diastolic 63 60 55  Pulse 72 64 52    Blood pressure 115/63, pulse 72, temperature 98.4 F (36.9 C), height 6\' 2"  (1.88 m), weight 295 lb 4.8 oz (133.9 kg), SpO2 94 %. Body mass index is 37.91 kg/m.   Physical Exam  Constitutional: He appears well-developed. No distress.  Morbidly obese. On nasal cannula O2.  HENT:  Head: Atraumatic.  Eyes: Conjunctivae are normal.  Neck: Neck supple. No thyromegaly present.  Short neck and difficult to evaluate JVP  Cardiovascular: Normal rate, regular rhythm and normal heart sounds. Exam reveals no gallop.  No murmur heard. Pulses:      Carotid pulses are 2+ on the right side with bruit and 2+ on the left side with bruit.      Dorsalis pedis pulses are 1+ on the right side and 1+ on the left side.       Posterior tibial pulses are 0 on the right side and 0 on the left side.  Femoral and popliteal pulse difficult to feel due to patient's body habitus.   Trace leg edema. No JVD.   Pulmonary/Chest: Effort normal. He has wheezes (faint bilateral expiratory wheezing).  Abdominal: Soft. Bowel sounds are normal.  Obese. Pannus present  Musculoskeletal: Normal range of motion.  Neurological: He is alert.  Skin: Skin is warm and dry.  Psychiatric: He has a normal mood and affect.   Radiology: No results found.  Laboratory examination:    Recent Labs    06/13/19 1230 08/07/19 1023  NA 141 146*  K 3.9 4.2  CL 98 100  CO2 39* 32*  GLUCOSE 92 106*  BUN 21 27  CREATININE 1.31 1.71*  CALCIUM 10.0 10.1  GFRNONAA  --  39*  GFRAA  --  45*   CMP Latest Ref Rng & Units 08/07/2019 06/13/2019 06/10/2011  Glucose 65 - 99 mg/dL 106(H) 92 118(H)  BUN 8 - 27 mg/dL 27 21 22   Creatinine 0.76 - 1.27 mg/dL  1.71(H) 1.31 1.34  Sodium 134 - 144 mmol/L 146(H) 141 143  Potassium 3.5 - 5.2 mmol/L 4.2 3.9 4.1  Chloride 96 - 106 mmol/L 100 98 101  CO2 20 - 29 mmol/L 32(H) 39(H) 31  Calcium 8.6 - 10.2 mg/dL 10.1 10.0 9.3  Total Protein 6.0 - 8.3 g/dL - 7.6 -  Total  Bilirubin 0.2 - 1.2 mg/dL - 0.8 -  Alkaline Phos 39 - 117 U/L - 100 -  AST 0 - 37 U/L - 22 -  ALT 0 - 53 U/L - 26 -   CBC Latest Ref Rng & Units 06/13/2019 10/06/2016 06/10/2011  WBC 4.0 - 10.5 K/uL 9.1 8.4 7.9  Hemoglobin 13.0 - 17.0 g/dL 15.5 16.8 11.2(L)  Hematocrit 39.0 - 52.0 % 46.0 49.6 33.7(L)  Platelets 150.0 - 400.0 K/uL 187.0 190.0 219   Lipid Panel  Labs 09/21/2018:  Total cholesterol 140, triglycerides 134, HDL 40, LDL 68.1. No results found for: CHOL, TRIG, HDL, CHOLHDL, VLDL, LDLCALC, LDLDIRECT   HEMOGLOBIN A1C No results found for: HGBA1C, MPG TSH No results for input(s): TSH in the last 8760 hours.   Pro BNP  06/13/2019: NT-Pro BNP 0 - 376 pg/mL 71    Medications   Prior to Admission medications   Medication Sig Start Date End Date Taking? Authorizing Provider  albuterol (PROAIR HFA) 108 (90 Base) MCG/ACT inhaler INHALE 2 PUFFS BY MOUTH INTO THE LUNGS EVERY 4 HOURS AS NEEDED FOR WHEEZING OR SHORTNESS OF BREATH 12/14/18   Juanito Doom, MD  allopurinol (ZYLOPRIM) 300 MG tablet Take 300 mg by mouth daily.    [provider]  ALPRAZolam Duanne Moron) 0.5 MG tablet Take 1 tablet (0.5 mg total) by mouth 2 (two) times daily. Patient taking differently: Take 0.5 mg by mouth at bedtime.  08/24/14   Kathee Delton, MD  amLODipine (NORVASC) 10 MG tablet Take 10 mg by mouth daily.     [provider]  atorvastatin (LIPITOR) 20 MG tablet Take 20 mg by mouth daily.    [provider]  azelastine (ASTELIN) 0.1 % nasal spray Place 1 spray into both nostrils 2 (two) times daily. Once daily 03/20/15   [provider]  azithromycin (ZITHROMAX) 250 MG tablet TAKE 1 TABLET EVERY DAY WITH FOOD 07/13/19    Lauraine Rinne, NP  budesonide-formoterol (SYMBICORT) 160-4.5 MCG/ACT inhaler INHALE 2 PUFFS BY MOUTH INTO THE LUNGS TWICE A DAY 12/14/18   Juanito Doom, MD  budesonide-formoterol (SYMBICORT) 160-4.5 MCG/ACT inhaler Inhale 2 puffs into the lungs 2 (two) times daily. 06/13/19   Lauraine Rinne, NP  doxycycline (VIBRA-TABS) 100 MG tablet Take 1 tablet (100 mg total) by mouth 2 (two) times daily. 06/13/19   Lauraine Rinne, NP  ELIQUIS 5 MG TABS tablet Take 5 mg by mouth 2 (two) times daily. 05/07/16   [provider]  fish oil-omega-3 fatty acids 1000 MG capsule Take 1 capsule by mouth 3 (three) times daily.      [provider]  flecainide (TAMBOCOR) 100 MG tablet Take 100 mg by mouth 2 (two) times daily. 05/20/16   [provider]  furosemide (LASIX) 40 MG tablet Take 40 mg by mouth daily.     [provider]  Linaclotide Rolan Lipa) 145 MCG CAPS capsule Take 145 mcg by mouth daily.    [provider]  losartan-hydrochlorothiazide Konrad Penta) 100-25 MG tablet  05/16/18   [provider]  metoprolol succinate (TOPROL-XL) 50 MG 24 hr tablet Take 50 mg by mouth daily. Take with or immediately following a meal.    [provider]  montelukast (SINGULAIR) 10 MG tablet TAKE 1 TABLET AT BEDTIME 05/07/19   Juanito Doom, MD  Multiple Vitamin (MULITIVITAMIN WITH MINERALS) TABS Take 1 tablet by mouth daily.    [provider]  neomycin-polymyxin b-dexamethasone (MAXITROL) 3.5-10000-0.1 OINT Only  as needed 06/17/18   [provider]  nitroGLYCERIN (NITROSTAT) 0.4 MG SL tablet Place 0.4 mg under the tongue every 5 (five) minutes as needed for chest pain.    [provider]  nystatin (MYCOSTATIN) 100000 UNIT/ML suspension Take 5 mLs (500,000 Units total) by mouth 4 (four) times daily. 06/13/19   Lauraine Rinne, NP  potassium chloride (KLOR-CON) 20 MEQ packet Take 20 mEq by mouth daily. 08/05/16   Magdalen Spatz, NP  predniSONE (DELTASONE)  10 MG tablet Please take 4 tabs for 2 days, then 3 tabs for 2 days, then 2 tabs for 2 days, then 1 tab for 2 days, then stop. 06/14/19   Lauraine Rinne, NP  Respiratory Therapy Supplies (FLUTTER) DEVI Use as directed 05/10/18   Lauraine Rinne, NP  roflumilast (DALIRESP) 500 MCG TABS tablet Take 1 tablet (500 mcg total) by mouth daily. 12/02/18   Juanito Doom, MD  rOPINIRole (REQUIP) 0.5 MG tablet Take 1 tablet (0.5 mg total) by mouth daily. 04/05/13   Clance, Armando Reichert, MD  temazepam (RESTORIL) 15 MG capsule Take 15 mg by mouth at bedtime as needed for sleep.     [provider]  Tiotropium Bromide Monohydrate (SPIRIVA RESPIMAT) 2.5 MCG/ACT AERS Inhale 2 puffs into the lungs daily. 12/02/18   Juanito Doom, MD  Tiotropium Bromide Monohydrate (SPIRIVA RESPIMAT) 2.5 MCG/ACT AERS Inhale 2 puffs into the lungs daily. 06/13/19   Lauraine Rinne, NP  traMADol (ULTRAM) 50 MG tablet Take 1 tablet (50 mg total) by mouth 3 (three) times daily as needed for moderate pain. 06/16/19   Adrian Prows, MD  vitamin C (ASCORBIC ACID) 500 MG tablet Take 500 mg by mouth daily.    [provider]     Current Outpatient Medications  Medication Instructions  . albuterol (PROAIR HFA) 108 (90 Base) MCG/ACT inhaler INHALE 2 PUFFS BY MOUTH INTO THE LUNGS EVERY 4 HOURS AS NEEDED FOR WHEEZING OR SHORTNESS OF BREATH  . allopurinol (ZYLOPRIM) 300 mg, Daily  . ALPRAZolam (XANAX) 0.5 mg, Oral, 2 times daily  . amLODipine (NORVASC) 10 mg, Daily  . atorvastatin (LIPITOR) 20 mg, Oral, Daily  . azelastine (ASTELIN) 0.1 % nasal spray 1 spray, Each Nare, 2 times daily, Once daily  . azithromycin (ZITHROMAX) 250 MG tablet Take Monday, Wednesday, Friday only with food.  . budesonide-formoterol (SYMBICORT) 160-4.5 MCG/ACT inhaler 2 puffs, Inhalation, 2 times daily  . Eliquis 5 mg, Oral, 2 times daily  . fish oil-omega-3 fatty acids 1000 MG capsule 1 capsule, 3 times daily  . flecainide (TAMBOCOR) 100 mg, Oral, 2 times daily   . furosemide (LASIX) 40 mg, Oral, 2 times daily, Am and 3 pm  . linaclotide (LINZESS) 145 mcg, Oral, Every other day  . losartan-hydrochlorothiazide (HYZAAR) 100-25 MG tablet 1 tablet, Daily  . metoprolol succinate (TOPROL-XL) 50 mg, Daily  . montelukast (SINGULAIR) 10 MG tablet TAKE 1 TABLET AT BEDTIME  . Multiple Vitamin (MULITIVITAMIN WITH MINERALS) TABS 1 tablet, Daily  . nitroGLYCERIN (NITROSTAT) 0.4 mg, Sublingual, Every 5 min PRN  . nystatin (MYCOSTATIN) 500,000 Units, Oral, 4 times daily  . potassium chloride (K-DUR) 10 MEQ tablet 10 mEq, Oral, Daily  . Respiratory Therapy Supplies (FLUTTER) DEVI Use as directed  . roflumilast (DALIRESP) 500 mcg, Oral, Daily  . rOPINIRole (REQUIP) 0.5 mg, Oral, Daily  . temazepam (RESTORIL) 15 mg, At bedtime PRN  . Tiotropium Bromide Monohydrate (SPIRIVA RESPIMAT) 2.5 MCG/ACT AERS 2 puffs, Inhalation, Daily  .  traMADol (ULTRAM) 50 mg, Oral, 3 times daily PRN  . vitamin C (ASCORBIC ACID) 500 mg, Oral, Daily   CT angio 05/22/2011: Mediastinal lymph nodes are not enlarged by CT size criteria. No hilar or axillary adenopathy. Atherosclerotic calcification of the arterial vasculature, including coronary arteries. Heart size normal. Anterior pericardial fluid or thickening. Pulmonary arteries are enlarged. Biapical pleural parenchymal scarring, left greater than right. Emphysema. Focal mucoid impaction in the anterior segment right upper lobe (image 25). Lungs are otherwise clear. No pleural fluid. Scattered debris in the trachea.  Cardiac Studies:   Chest X-Ray 04/21/2016: 1. Stable cardiomegaly.  No pulmonary venous congestion. 2. Stable chronic interstitial changes consistent with interstitial fibrosis  Echocardiogram 05/07/2016: Left ventricle cavity is normal in size. Moderate concentric hypertrophy of the left ventricle. Hyperdynamic LV systolic function with cavitary obliteration. Suspect intraventricular pressure gradient. Doppler evidence of grade  I (impaired) diastolic dysfunction. Visual EF is 65-70%. Left atrial cavity is severely dilated at 5.5cm. Moderate tricuspid regurgitation. Moderate pulmonary hypertension. Pulmonary artery systolic pressure is estimated at 50mm Hg. Insignificant pericardial effusion. IVC is dilated with respiratory variation. This suggests elevated right heart pressure. Compared to the study done on 02/17/2014, LA size was mildly dilated and Pul HTN new.  Lexiscan myoview stress test 05/01/2016: 1. The resting electrocardiogram demonstrated atrial fibrillation, normal resting conduction and nonspecific ST-T changes. Stress EKG is non-diagnostic for ischemia as it a pharmacologic stress using Lexiscan. Stress symptoms included dyspnea. 2. The perfusion imaging study demonstrates mild diaphragmatic attenuation artifact in the inferior wall, there is no evidence of ischemia or scar. Left ventricle systolic function calculated by QGS was 86%. This is a low risk study. No significant change from 08/31/2014.  Carotid artery duplex 02/01/2018: No hemodynamically significant arterial disease in the internal carotid artery bilaterally. Right vertebral artery flow is not visualized. Left vertebral artery flow is not visualized. Compared to study done on 09/26/2014, left ICA stenosis of less than 50% not present. Previously vertebral arteries were well visualized.  Sleep Study [facility] 10/02/2018: Sleep Apnea mild. 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 Moderate pulmonary hypertension, mean pressure 32 mmHg.  Event Monitor 2 weeks 09/04/19 -09/09/2016: No symptoms reported. NSR.   Echocardiogram 08/07/2019: Inadequate visualization due to suboptimal acoustic windows.  Left ventricle cavity is normal in size. Moderate concentric hypertrophy of the left ventricle. Normal LV systolic function with visual  EF 55-60%. Normal global wall motion. Doppler evidence of grade I (impaired) diastolic dysfunction, normal LAP.  Mild (Grade I) mitral regurgitation. Inadequate TR jet to estimate pulmonary artery systolic pressure.  IVC is dilated with respiratory variation. Estimated RA pressure 8 mmHg Left atrial dilatation and pulmonary hypertension noted in 2017 not well appreciated on this study. This could be attributed to quality of the study. Consider alternate testing modality, if clinical suspicion is high.  Assessment     ICD-10-CM   1. Chronic diastolic (congestive) heart failure (HCC)  I50.32   2. Chronic respiratory failure with hypoxia (HCC)  J96.11   3. Essential hypertension  I10   4. Bifascicular block  I45.2 EKG 12-Lead  5. Stage 3b chronic kidney disease  N18.32     EKG 09/08/2019: Sinus rhythm with first-degree AV block at the rate of 72 beats vomiting, left atrial enlargement, left axis deviation, left anterior fascicular block.  Poor R wave progression, cannot exclude anteroseptal infarct old.  No evidence of  ischemia. No significant change from  EKG 07/21/2019: Marked sinus bradycardia at rate of 52 bpm with first-degree AV block, PR interval 380 ms.   Recommendations:   AITAN BECKMANN  is a 73 y.o. Caucasian male  with HTN, COPD on home O2 (recently increased to 6L), and HLD, paroxysmal A. Fibrillation maintaing sinus rhythm since being on flecanide in June 2017. Has had left heart catheterization in [facility] and no significant CAD found.  Patient was seen by me for acute diastolic heart failure 6 weeks ago, he now presents for follow-up, and also increase his furosemide to twice daily.  He does have underlying chronic kidney disease stage III, and a single kidney and underlying severe COPD and oxygen dependency.  Although BNP was not significantly elevated, clearly his clinical signs were suggestive of fluid overload.  Advised him to continue  furosemide, but could reduce it to once a day and could also potentially could take furosemide once a day instead of twice daily if leg edema is better and his breathing is better and his weight is stable.  I given him encouragement that he has lost 10 pounds since last office visit, he has made lifestyle changes, also avoiding salt.  Complains of leg cramps, probably related to low potassium but also has got back pain and sciatica.  Will increase his potassium chloride from 10 mEq to 20 mEq.  Otherwise stable from cardiac standpoint, no significant change in the echocardiogram, I will see him back in 6 months.    Blood pressure is well controlled.  He does have underlying first-degree AV block and left anterior fascicular block we will continue to monitor this clinically.  Adrian Prows, MD, [facility] 09/08/2019, 11:04 AM [facility] [place] Pager: [phone] Office: [phone] If no answer Cell [phone]

## 2019-10-20 ENCOUNTER — Other Ambulatory Visit: Payer: Self-pay | Admitting: Pulmonary Disease

## 2019-10-31 NOTE — Progress Notes (Signed)
. Virtual Visit via Telephone Note  I connected with Harold Hall on 11/01/19 at  9:00 AM EST by telephone and verified that I am speaking with the correct person using two identifiers.  Location: Patient: Home Provider: Office [facility] - [id number], [place], [place], [place] [address]   I discussed the limitations, risks, security and privacy concerns of performing an evaluation and management service by telephone and the availability of in person appointments. I also discussed with the patient that there may be a patient responsible charge related to this service. The patient expressed understanding and agreed to proceed.  Patient consented to consult via telephone: Yes People present and their role in pt care: Pt   History of Present Illness:  73 year old male former smoker followed in our office for COPD  PMH: Abdominal pain, restless leg syndrome, A. fib, edema Smoker/ Smoking History: Former Smoker.  Quit 2012.  75-pack-year smoking history. Maintenance:  Symbicort 160, Spiriva Resp 2.5, Daliresp, MWF azithromycin 250 mg tablet Pt of: Dr. Lake Bells   Chief complaint: 3 month follow up   73 year old male former smoker followed in our office for COPD.  Patient is prone to frequent COPD exacerbations which is why he is maintained on Daliresp 500 mg as well as Monday Wednesday Friday azithromycin.  He continues to be on triple therapy with Symbicort 160 as well as Spiriva Respimat 2.5.  This has been a stable interval for the patient he has not had any exacerbations over the last 3 months.  He has been doing well.  He has established with cardiology Dr. Einar Gip.  He was able to be diuresed and get off about 10 pounds.  This significantly helped his shortness of breath as well as his breathing.  He will continue to follow-up with Dr. Einar Gip.  He is been following a low-sodium diet as well as weighing himself regularly.  This is great news.  Former patient of Dr. Lake Bells.   Needs to establish with new pulmonologist.  We will discuss this and coordinated today.  Observations/Objective:  Imaging: December 2017 chest CT images showing significant emphysema with an upper lobe predominance, no clear interstitial lung disease.  Blood: 09/2016 IgE 4 08/15/2018 Hgb 16.9 mg/dL  Echo: 08/15/2018 LVEF > 55%, mod LVH, dilated left atrial, normal RV, no shunt  PFT: [facility] 2007:  FEV1 2.10 (46%), ratio 47 PFT 2017 Ratio 47%, FEV1 1.76L (46% pred), FVC 3.78L, TLC 6.45L (82% pred), DLCO 13.18  Social History   Tobacco Use  Smoking Status Former Smoker  . Packs/day: 1.50  . Years: 50.00  . Pack years: 75.00  . Types: Cigarettes  . Quit date: 06/04/2011  . Years since quitting: 8.4  Smokeless Tobacco Never Used   Immunization History  Administered Date(s) Administered  . Fluad Quad(high Dose 65+) 08/02/2019  . Influenza Split 08/13/2011, 08/21/2012, 08/17/2014, 08/24/2015  . Influenza Whole 08/12/2009, 08/09/2010  . Influenza, High Dose Seasonal PF 08/09/2013, 08/13/2016, 09/05/2017, 07/08/2018  . Pneumococcal Conjugate-13 08/17/2014  . Pneumococcal Polysaccharide-23 08/10/2007      Assessment and Plan:  Diastolic CHF ([place]) Plan: Continue to weigh yourself Continue to follow-up with cardiology Continue to follow low-sodium diet  Chronic respiratory failure with hypoxia ([place]) Plan: Continue oxygen therapy as prescribed Follow-up with our office in 6 to 8 weeks to establish with new pulmonologist Monitor oxygen saturations and ensure they are above 90%  COPD (chronic obstructive pulmonary disease) with emphysema ([place]) Plan: Continue Symbicort 160 Continue Spiriva Respimat 2.5  Refill of Symbicort sent to pharmacy today Patient could be a candidate of a triple therapy inhaler such as Breztri at next office visit Continue Monday Wednesday Friday azithromycin Continue Daliresp 500 mcg Follow-up with our office in 6 to 8 weeks and 30-minute time slot  to establish with new pulmonary provider  Medication management Plan: Could consider trial of Breztri at next office visit if samples are available  Therapeutic drug monitoring Plan: Continue follow-up with cardiology Can consider EKG at next office visit for monitoring of Monday Wednesday Friday azithromycin   Follow Up Instructions:  Return in about 2 months (around 01/02/2020), or if symptoms worsen or fail to improve, for Follow up with Dr. Valeta Harms.   I discussed the assessment and treatment plan with the patient. The patient was provided an opportunity to ask questions and all were answered. The patient agreed with the plan and demonstrated an understanding of the instructions.   The patient was advised to call back or seek an in-person evaluation if the symptoms worsen or if the condition fails to improve as anticipated.  I provided 25 minutes of non-face-to-face time during this encounter.   Lauraine Rinne, NP

## 2019-11-01 ENCOUNTER — Encounter: Payer: Self-pay | Admitting: Pulmonary Disease

## 2019-11-01 ENCOUNTER — Other Ambulatory Visit: Payer: Self-pay

## 2019-11-01 ENCOUNTER — Ambulatory Visit (INDEPENDENT_AMBULATORY_CARE_PROVIDER_SITE_OTHER): Payer: Medicare Other | Admitting: Pulmonary Disease

## 2019-11-01 DIAGNOSIS — I5032 Chronic diastolic (congestive) heart failure: Secondary | ICD-10-CM

## 2019-11-01 DIAGNOSIS — Z79899 Other long term (current) drug therapy: Secondary | ICD-10-CM | POA: Diagnosis not present

## 2019-11-01 DIAGNOSIS — J9611 Chronic respiratory failure with hypoxia: Secondary | ICD-10-CM

## 2019-11-01 DIAGNOSIS — J432 Centrilobular emphysema: Secondary | ICD-10-CM

## 2019-11-01 DIAGNOSIS — I5030 Unspecified diastolic (congestive) heart failure: Secondary | ICD-10-CM | POA: Insufficient documentation

## 2019-11-01 DIAGNOSIS — Z5181 Encounter for therapeutic drug level monitoring: Secondary | ICD-10-CM

## 2019-11-01 MED ORDER — BUDESONIDE-FORMOTEROL FUMARATE 160-4.5 MCG/ACT IN AERO: 2.0000 | INHALATION_SPRAY | Freq: Two times a day (BID) | RESPIRATORY_TRACT | 5 refills | Status: DC

## 2019-11-01 NOTE — Patient Instructions (Signed)
You were seen today by Lauraine Rinne, NP  for:   1. Chronic respiratory failure with hypoxia (HCC)  Continue oxygen therapy as prescribed  >>>maintain oxygen saturations greater than 88 percent  >>>if unable to maintain oxygen saturations please contact the office  >>>do not smoke with oxygen  >>>can use nasal saline gel or nasal saline rinses to moisturize nose if oxygen causes dryness  2. Centrilobular emphysema (HCC)  Continue Symbicort >>> 2 puffs in the morning right when you wake up, rinse out your mouth after use, 12 hours later 2 puffs, rinse after use >>> Take this daily, no matter what >>> This is not a rescue inhaler   Spiriva Respimat 2.5 >>> 2 puffs daily >>> Do this every day >>>This is not a rescue inhaler   Note your daily symptoms > remember "red flags" for COPD:   >>>Increase in cough >>>increase in sputum production >>>increase in shortness of breath or activity  intolerance.   If you notice these symptoms, please call the office to be seen.    3. Chronic diastolic congestive heart failure ([place])  Continue to weigh yourself daily  Continue to work with Cardiology   Patient Education for Heart Failure  Do the following things EVERY DAY:   1. Weigh yourself EVERY morning after you go to the bathroom but before you eat or drink anything. Write this number down in a weight log/diary.    2. Take your medicines as prescribed. If you have concerns about your medications, please call us before you stop taking them.    3. Eat low salt foods--Limit salt (sodium) to 2000 mg per day. This will help prevent your body from holding onto fluid. Read food labels as many processed foods have a lot of sodium, especially canned goods and prepackaged meats. If you would like some assistance choosing low sodium foods, we would be happy to set you up with a nutritionist.   4. Stay as active as you can everyday. Staying active will give you more energy and make your muscles  stronger. Start with 5 minutes at a time and work your way up to 30 minutes a day. Break up your activities--do some in the morning and some in the afternoon. Start with 3 days per week and work your way up to 5 days as you can.  If you have chest pain, feel short of breath, dizzy, or lightheaded, STOP. If you don't feel better after a short rest, call 911. If you do feel better, call the office to let us know you have symptoms with exercise.   5. Limit all fluids for the day to less than 2 liters. Fluid includes all drinks, coffee, juice, ice chips, soup, jello, and all other liquids.   4. Medication management  Could consider Breztri   5. Therapeutic drug monitoring  Consider EKG at next office    We recommend today:  No orders of the defined types were placed in this encounter.  No orders of the defined types were placed in this encounter.  No orders of the defined types were placed in this encounter.   Follow Up:    Return in about 2 months (around 01/02/2020), or if symptoms worsen or fail to improve, for Follow up with Dr. Valeta Harms.   Please do your part to reduce the spread of COVID-19:      Reduce your risk of any infection  and COVID19 by using the similar precautions used for avoiding the common cold or  flu:  . Wash your hands often with soap and warm water for at least 20 seconds.  If soap and water are not readily available, use an alcohol-based hand sanitizer with at least 60% alcohol.  . If coughing or sneezing, cover your mouth and nose by coughing or sneezing into the elbow areas of your shirt or coat, into a tissue or into your sleeve (not your hands). Langley Gauss A MASK when in public  . Avoid shaking hands with others and consider head nods or verbal greetings only. . Avoid touching your eyes, nose, or mouth with unwashed hands.  . Avoid close contact with people who are sick. . Avoid places or events with large numbers of people in one location, like concerts or  sporting events. . If you have some symptoms but not all symptoms, continue to monitor at home and seek medical attention if your symptoms worsen. . If you are having a medical emergency, call 911.   [place] / e-Visit: [email or web address]         [facility]: [facility]: [id number]                   [facility]: [id number]     It is flu season:   >>> Best ways to protect herself from the flu: Receive the yearly flu vaccine, practice good hand hygiene washing with soap and also using hand sanitizer when available, eat a nutritious meals, get adequate rest, hydrate appropriately   Please contact the office if your symptoms worsen or you have concerns that you are not improving.   Thank you for choosing [facility] for your healthcare, and for allowing [place] to partner with you on your healthcare journey. I am thankful to be able to provide care to you today.   [name]-C    Chronic Obstructive Pulmonary Disease Chronic obstructive pulmonary disease (COPD) is a long-term (chronic) lung problem. When you have COPD, it is hard for air to get in and out of your lungs. Usually the condition gets worse over time, and your lungs will never return to normal. There are things you can do to keep yourself as healthy as possible.  Your doctor may treat your condition with: ? Medicines. ? Oxygen. ? Lung surgery.  Your doctor may also recommend: ? Rehabilitation. This includes steps to make your body work better. It may involve a team of specialists. ? Quitting smoking, if you smoke. ? Exercise and changes to your diet. ? Comfort measures (palliative care). Follow these instructions at home: Medicines  Take over-the-counter and prescription medicines only as told by your doctor.  Talk to your doctor before taking  any cough or allergy medicines. You may need to avoid medicines that cause your lungs to be dry. Lifestyle  If you smoke, stop. Smoking makes the problem worse. If you need help quitting, ask your doctor.  Avoid being around things that make your breathing worse. This may include smoke, chemicals, and fumes.  Stay active, but remember to rest as well.  Learn and use tips on how to relax.  Make sure you get enough sleep. Most adults need at least 7 hours of sleep every night.  Eat healthy foods. Eat smaller meals more often. Rest before meals. Controlled breathing Learn and use tips on how to control your breathing as told by your doctor. Try:  Breathing in (inhaling) through your  nose for 1 second. Then, pucker your lips and breath out (exhale) through your lips for 2 seconds.  Putting one hand on your belly (abdomen). Breathe in slowly through your nose for 1 second. Your hand on your belly should move out. Pucker your lips and breathe out slowly through your lips. Your hand on your belly should move in as you breathe out.  Controlled coughing Learn and use controlled coughing to clear mucus from your lungs. Follow these steps: 1. Lean your head a little forward. 2. Breathe in deeply. 3. Try to hold your breath for 3 seconds. 4. Keep your mouth slightly open while coughing 2 times. 5. Spit any mucus out into a tissue. 6. Rest and do the steps again 1 or 2 times as needed. General instructions  Make sure you get all the shots (vaccines) that your doctor recommends. Ask your doctor about a flu shot and a pneumonia shot.  Use oxygen therapy and pulmonary rehabilitation if told by your doctor. If you need home oxygen therapy, ask your doctor if you should buy a tool to measure your oxygen level (oximeter).  Make a COPD action plan with your doctor. This helps you to know what to do if you feel worse than usual.  Manage any other conditions you have as told by your doctor.  Avoid  going outside when it is very hot, cold, or humid.  Avoid people who have a sickness you can catch (contagious).  Keep all follow-up visits as told by your doctor. This is important. Contact a doctor if:  You cough up more mucus than usual.  There is a change in the color or thickness of the mucus.  It is harder to breathe than usual.  Your breathing is faster than usual.  You have trouble sleeping.  You need to use your medicines more often than usual.  You have trouble doing your normal activities such as getting dressed or walking around the house. Get help right away if:  You have shortness of breath while resting.  You have shortness of breath that stops you from: ? Being able to talk. ? Doing normal activities.  Your chest hurts for longer than 5 minutes.  Your skin color is more blue than usual.  Your pulse oximeter shows that you have low oxygen for longer than 5 minutes.  You have a fever.  You feel too tired to breathe normally. Summary  Chronic obstructive pulmonary disease (COPD) is a long-term lung problem.  The way your lungs work will never return to normal. Usually the condition gets worse over time. There are things you can do to keep yourself as healthy as possible.  Take over-the-counter and prescription medicines only as told by your doctor.  If you smoke, stop. Smoking makes the problem worse. This information is not intended to replace advice given to you by your health care provider. Make sure you discuss any questions you have with your health care provider. Document Released: 04/13/2008 Document Revised: 10/08/2017 Document Reviewed: 11/30/2016 Elsevier Patient Education  2020 [employer].

## 2019-11-01 NOTE — Assessment & Plan Note (Signed)
Plan: Continue to weigh yourself Continue to follow-up with cardiology Continue to follow low-sodium diet

## 2019-11-01 NOTE — Assessment & Plan Note (Signed)
Plan: Continue oxygen therapy as prescribed Follow-up with our office in 6 to 8 weeks to establish with new pulmonologist Monitor oxygen saturations and ensure they are above 90%

## 2019-11-01 NOTE — Assessment & Plan Note (Signed)
Plan: Continue Symbicort 160 Continue Spiriva Respimat 2.5 Refill of Symbicort sent to pharmacy today Patient could be a candidate of a triple therapy inhaler such as Breztri at next office visit Continue Monday Wednesday Friday azithromycin Continue Daliresp 500 mcg Follow-up with our office in 6 to 8 weeks and 30-minute time slot to establish with new pulmonary provider

## 2019-11-01 NOTE — Assessment & Plan Note (Signed)
Plan: Could consider trial of Breztri at next office visit if samples are available

## 2019-11-01 NOTE — Addendum Note (Signed)
Addended by: Valerie Salts on: 11/01/2019 09:25 AM   Modules accepted: Orders

## 2019-11-01 NOTE — Assessment & Plan Note (Signed)
Plan: Continue follow-up with cardiology Can consider EKG at next office visit for monitoring of Monday Wednesday Friday azithromycin

## 2019-11-09 NOTE — Progress Notes (Signed)
PCCM: Glad to establish care. [facility] 11/09/2019 1:00 PM

## 2019-11-14 ENCOUNTER — Telehealth: Payer: Self-pay | Admitting: Pulmonary Disease

## 2019-11-14 MED ORDER — BUDESONIDE-FORMOTEROL FUMARATE 160-4.5 MCG/ACT IN AERO: 2.0000 | INHALATION_SPRAY | Freq: Two times a day (BID) | RESPIRATORY_TRACT | 3 refills | Status: DC

## 2019-11-14 NOTE — Telephone Encounter (Signed)
I have sent the Symbicort to [facility] for a 90 day supply and patient has been made aware. Nothing further is needed at this time.

## 2019-11-29 ENCOUNTER — Telehealth: Payer: Self-pay | Admitting: Pulmonary Disease

## 2019-11-29 MED ORDER — AZITHROMYCIN 250 MG PO TABS: ORAL_TABLET | ORAL | 2 refills | Status: AC

## 2019-11-29 NOTE — Telephone Encounter (Signed)
Called [employer] and spoke to pharmacist. He states there were two rxs on pt's zithromax. One rx stated to take daily and the other was M/W/F. Per pt's last visit in office with BE[facility], pt is to take Zithromax on M/W/F. Refill called in. Called and spoke to pt. Informed him of the refill. Pt verbalized understanding and denied any further questions or concerns at this time.

## 2019-11-29 NOTE — Telephone Encounter (Signed)
[employer] also called needs clarification on this med also [phone]

## 2019-12-15 ENCOUNTER — Telehealth: Payer: Self-pay | Admitting: Pulmonary Disease

## 2019-12-15 MED ORDER — SPIRIVA RESPIMAT 2.5 MCG/ACT IN AERS: 2.0000 | INHALATION_SPRAY | Freq: Every day | RESPIRATORY_TRACT | 1 refills | Status: DC

## 2019-12-15 NOTE — Telephone Encounter (Signed)
I called pt but there was no answer. I left a message for him to call me back.  I sent in 90 day Rx for Spiriva to [facility] as requested.

## 2019-12-18 NOTE — Telephone Encounter (Signed)
Spoke with pt's wife, [name]. She is aware that we refilled his medication. Nothing further was needed.

## 2020-01-01 DIAGNOSIS — Z9981 Dependence on supplemental oxygen: Secondary | ICD-10-CM | POA: Diagnosis not present

## 2020-01-01 DIAGNOSIS — R0902 Hypoxemia: Secondary | ICD-10-CM | POA: Diagnosis not present

## 2020-01-01 DIAGNOSIS — J449 Chronic obstructive pulmonary disease, unspecified: Secondary | ICD-10-CM | POA: Diagnosis not present

## 2020-01-01 DIAGNOSIS — N1831 Chronic kidney disease, stage 3a: Secondary | ICD-10-CM | POA: Diagnosis not present

## 2020-01-01 DIAGNOSIS — R109 Unspecified abdominal pain: Secondary | ICD-10-CM | POA: Diagnosis not present

## 2020-01-01 DIAGNOSIS — Z905 Acquired absence of kidney: Secondary | ICD-10-CM | POA: Diagnosis not present

## 2020-01-01 DIAGNOSIS — Z1389 Encounter for screening for other disorder: Secondary | ICD-10-CM | POA: Diagnosis not present

## 2020-01-01 DIAGNOSIS — Z85528 Personal history of other malignant neoplasm of kidney: Secondary | ICD-10-CM | POA: Diagnosis not present

## 2020-01-03 ENCOUNTER — Encounter: Payer: Self-pay | Admitting: Pulmonary Disease

## 2020-01-03 ENCOUNTER — Ambulatory Visit (INDEPENDENT_AMBULATORY_CARE_PROVIDER_SITE_OTHER): Payer: Medicare Other | Admitting: Pulmonary Disease

## 2020-01-03 ENCOUNTER — Other Ambulatory Visit: Payer: Self-pay

## 2020-01-03 VITALS — BP 130/62 | HR 69 | Ht 74.0 in | Wt 295.2 lb

## 2020-01-03 DIAGNOSIS — R0602 Shortness of breath: Secondary | ICD-10-CM

## 2020-01-03 DIAGNOSIS — I5032 Chronic diastolic (congestive) heart failure: Secondary | ICD-10-CM

## 2020-01-03 DIAGNOSIS — Z5181 Encounter for therapeutic drug level monitoring: Secondary | ICD-10-CM

## 2020-01-03 DIAGNOSIS — Z79899 Other long term (current) drug therapy: Secondary | ICD-10-CM | POA: Diagnosis not present

## 2020-01-03 DIAGNOSIS — J432 Centrilobular emphysema: Secondary | ICD-10-CM

## 2020-01-03 DIAGNOSIS — J9611 Chronic respiratory failure with hypoxia: Secondary | ICD-10-CM

## 2020-01-03 DIAGNOSIS — J449 Chronic obstructive pulmonary disease, unspecified: Secondary | ICD-10-CM | POA: Diagnosis not present

## 2020-01-03 MED ORDER — BREZTRI AEROSPHERE 160-9-4.8 MCG/ACT IN AERO: 2.0000 | INHALATION_SPRAY | Freq: Two times a day (BID) | RESPIRATORY_TRACT | 0 refills | Status: DC

## 2020-01-03 NOTE — Patient Instructions (Addendum)
Thank you for visiting Dr. Valeta Harms at [facility]. Today we recommend the following:  Start samples of breztri today with spacer Continue daliresp, azithromycin Order for CT Chest, to coordinate with abd/pelvis to be ordered by PCP   Return in about 2 months (around 03/02/2020) for w/ Dr. Valeta Harms .    Please do your part to reduce the spread of COVID-19.

## 2020-01-03 NOTE — Addendum Note (Signed)
Addended by: Lorretta Harp on: 01/03/2020 12:24 PM   Modules accepted: Orders

## 2020-01-03 NOTE — Progress Notes (Signed)
Synopsis: Referred in February 2021 for establish care with new pulmonary provider, PCP: 74 year old gentleman past medical history by Alroy Dust, L.[name], MD  Subjective:   PATIENT ID: Harold Hall GENDER: male DOB: 02/22/1946, MRN: [id number]  Chief Complaint  Patient presents with  . Follow-up    Pt states he has been doing okay since last visit. States his breathing has been a little worse x1 week and has had some drops in O2 sats. Pt has also had an occ cough with clear phlegm.    74 year old gentleman past medical history of renal cancer, hypertension, hyperlipidemia.  Last seen in our office by Wyn Quaker, NP.  Patient followed in the office for COPD.  Former patient of Dr. Lake Bells.  Currently maintained on Symbicort, Spiriva, Daliresp, azithromycin Monday Wednesday Friday.  Patient has a 75-year pack of smoking.  Patient's last set of full PFTs was in 2017 which revealed an FEV1 of 1.76 L, 46% predicted current respiratory status and chronic hypoxemic respiratory failure complicated by COPD and chronic diastolic heart failure.  Currently maintained on home oxygen therapy.  OV 01/03/2020: Today, patient here today with follow-up for advanced COPD.  He still has some dyspnea on exertion.  Has not been able to go to pulmonary rehab due to Covid.  Did not feel safe doing this.  On continuous oxygen therapy 6 L.  At this time does not feel like his inhalers are working as well as they used to.  Has some trouble getting the medication from his Symbicort.  He had talked about potentially switching to a single inhaler regimen to include triple therapy with Wyn Quaker, NP at his last office visit.  Patient denies hemoptysis.  He does have sputum production daily.  Recently complained of fullness in the right side to his primary care provider.  Has a history of kidney cancer.  They are planning to have a CT image of his abdomen.    Past Medical History:  Diagnosis Date  . Allergic rhinitis   .  Arthritis   . Cancer [facility])    left renal -solitary kidney  . COPD (chronic obstructive pulmonary disease) ([place])   . Erectile dysfunction   . Gout   . Hernia   . History of kidney cancer   . Hypercholesterolemia   . Hypertension   . RLS (restless legs syndrome)   . Sinus problem      Family History  Problem Relation Age of Onset  . COPD Mother   . Hypertension Mother   . Osteoporosis Mother   . Prostate cancer Father   . Cancer Father        prostate     Past Surgical History:  Procedure Laterality Date  . CARDIAC CATHETERIZATION    . HERNIA REPAIR  06/04/11  . left kidney removed  1994    Social History   Socioeconomic History  . Marital status: Married    Spouse name: Not on file  . Number of children: 1  . Years of education: Not on file  . Highest education level: Not on file  Occupational History  . Not on file  Tobacco Use  . Smoking status: Former Smoker    Packs/day: 2.00    Years: 50.00    Pack years: 100.00    Types: Cigarettes    Quit date: 06/04/2011    Years since quitting: 8.5  . Smokeless tobacco: Never Used  Substance and Sexual Activity  . Alcohol use: Yes    Alcohol/week:  2.0 - 3.0 standard drinks    Types: 2 - 3 Cans of beer per week    Comment: beers  . Drug use: No  . Sexual activity: Not on file  Other Topics Concern  . Not on file  Social History Narrative  . Not on file   Social Determinants of Health   Financial Resource Strain:   . Difficulty of Paying Living Expenses: Not on file  Food Insecurity:   . Worried About Charity fundraiser in the Last Year: Not on file  . Ran Out of Food in the Last Year: Not on file  Transportation Needs:   . Lack of Transportation (Medical): Not on file  . Lack of Transportation (Non-Medical): Not on file  Physical Activity:   . Days of Exercise per Week: Not on file  . Minutes of Exercise per Session: Not on file  Stress:   . Feeling of Stress : Not on file  Social Connections:   .  Frequency of Communication with Friends and Family: Not on file  . Frequency of Social Gatherings with Friends and Family: Not on file  . Attends Religious Services: Not on file  . Active Member of Clubs or Organizations: Not on file  . Attends Archivist Meetings: Not on file  . Marital Status: Not on file  Intimate Partner Violence:   . Fear of Current or Ex-Partner: Not on file  . Emotionally Abused: Not on file  . Physically Abused: Not on file  . Sexually Abused: Not on file     No Known Allergies   Outpatient Medications Prior to Visit  Medication Sig Dispense Refill  . albuterol (PROAIR HFA) 108 (90 Base) MCG/ACT inhaler INHALE 2 PUFFS BY MOUTH INTO THE LUNGS EVERY 4 HOURS AS NEEDED FOR WHEEZING OR SHORTNESS OF BREATH 3 Inhaler 3  . allopurinol (ZYLOPRIM) 300 MG tablet Take 300 mg by mouth daily.    Marland Kitchen ALPRAZolam (XANAX) 0.5 MG tablet Take 1 tablet (0.5 mg total) by mouth 2 (two) times daily. (Patient taking differently: Take 0.5 mg by mouth at bedtime. ) 60 tablet 0  . amLODipine (NORVASC) 10 MG tablet Take 10 mg by mouth daily.     Marland Kitchen atorvastatin (LIPITOR) 20 MG tablet Take 20 mg by mouth daily.    Marland Kitchen azelastine (ASTELIN) 0.1 % nasal spray Place 1 spray into both nostrils 2 (two) times daily. Once daily  2  . azithromycin (ZITHROMAX) 250 MG tablet Take Monday, Wednesday, Friday only with food. 39 tablet 2  . budesonide-formoterol (SYMBICORT) 160-4.5 MCG/ACT inhaler Inhale 2 puffs into the lungs 2 (two) times daily. 3 Inhaler 3  . DALIRESP 500 MCG TABS tablet TAKE 1 TABLET EVERY DAY 90 tablet 3  . ELIQUIS 5 MG TABS tablet Take 5 mg by mouth 2 (two) times daily.  0  . fish oil-omega-3 fatty acids 1000 MG capsule Take 1 capsule by mouth 3 (three) times daily.      . flecainide (TAMBOCOR) 100 MG tablet Take 100 mg by mouth 2 (two) times daily.  0  . furosemide (LASIX) 40 MG tablet Take 1 tablet (40 mg total) by mouth 2 (two) times daily as needed for fluid. Am and 3 pm 60  tablet 3  . Guaifenesin (MUCINEX MAXIMUM STRENGTH) 1200 MG TB12 Take 1,200 mg by mouth in the morning and at bedtime.    . Linaclotide (LINZESS) 145 MCG CAPS capsule Take 145 mcg by mouth every other day.     Marland Kitchen  losartan-hydrochlorothiazide (HYZAAR) 100-25 MG tablet 1 tablet daily.     . metoprolol succinate (TOPROL-XL) 50 MG 24 hr tablet Take 50 mg by mouth daily. Take with or immediately following a meal.    . montelukast (SINGULAIR) 10 MG tablet TAKE 1 TABLET AT BEDTIME 90 tablet 2  . Multiple Vitamin (MULITIVITAMIN WITH MINERALS) TABS Take 1 tablet by mouth daily.    . nitroGLYCERIN (NITROSTAT) 0.4 MG SL tablet Place 0.4 mg under the tongue every 5 (five) minutes as needed for chest pain.    Marland Kitchen nystatin (MYCOSTATIN) 100000 UNIT/ML suspension Take 5 mLs (500,000 Units total) by mouth 4 (four) times daily. 60 mL 0  . potassium chloride (KLOR-CON) 10 MEQ tablet Take 1 tablet (10 mEq total) by mouth 2 (two) times daily as needed (With furosemide). 180 tablet 3  . Respiratory Therapy Supplies (FLUTTER) DEVI Use as directed 1 each 0  . rOPINIRole (REQUIP) 0.5 MG tablet Take 1 tablet (0.5 mg total) by mouth daily. 30 tablet 0  . temazepam (RESTORIL) 15 MG capsule Take 15 mg by mouth at bedtime as needed for sleep.     . Tiotropium Bromide Monohydrate (SPIRIVA RESPIMAT) 2.5 MCG/ACT AERS Inhale 2 puffs into the lungs daily. 12 g 1  . traMADol (ULTRAM) 50 MG tablet Take 1 tablet (50 mg total) by mouth 3 (three) times daily as needed for moderate pain. 90 tablet 0  . vitamin C (ASCORBIC ACID) 500 MG tablet Take 500 mg by mouth daily.     No facility-administered medications prior to visit.    Review of Systems  Constitutional: Negative for chills, fever, malaise/fatigue and weight loss.  HENT: Negative for hearing loss, sore throat and tinnitus.   Eyes: Negative for blurred vision and double vision.  Respiratory: Positive for shortness of breath. Negative for cough, hemoptysis, sputum production,  wheezing and stridor.   Cardiovascular: Negative for chest pain, palpitations, orthopnea, leg swelling and PND.  Gastrointestinal: Negative for abdominal pain, constipation, diarrhea, heartburn, nausea and vomiting.       Abdominal fullness  Genitourinary: Negative for dysuria, hematuria and urgency.  Musculoskeletal: Negative for joint pain and myalgias.  Skin: Negative for itching and rash.  Neurological: Negative for dizziness, tingling, weakness and headaches.  Endo/Heme/Allergies: Negative for environmental allergies. Does not bruise/bleed easily.  Psychiatric/Behavioral: Negative for depression. The patient is not nervous/anxious and does not have insomnia.   All other systems reviewed and are negative.    Objective:  Physical Exam Vitals reviewed.  Constitutional:      General: He is not in acute distress.    Appearance: He is well-developed. He is obese.  HENT:     Head: Normocephalic and atraumatic.  Eyes:     General: No scleral icterus.    Conjunctiva/sclera: Conjunctivae normal.     Pupils: Pupils are equal, round, and reactive to light.  Neck:     Vascular: No JVD.     Trachea: No tracheal deviation.  Cardiovascular:     Rate and Rhythm: Normal rate and regular rhythm.     Heart sounds: Normal heart sounds.     Comments: Distant heart tones S1-S2 Pulmonary:     Effort: Pulmonary effort is normal. No tachypnea, accessory muscle usage or respiratory distress.     Breath sounds: Normal breath sounds. No stridor. No wheezing, rhonchi or rales.  Abdominal:     Palpations: Abdomen is soft. There is no mass.     Comments: Severely obese abdomen  Musculoskeletal:  General: No tenderness.     Cervical back: Neck supple.  Skin:    General: Skin is warm and dry.     Capillary Refill: Capillary refill takes less than 2 seconds.     Findings: No rash.  Neurological:     Mental Status: He is alert and oriented to person, place, and time.  Psychiatric:         Behavior: Behavior normal.      Vitals:   01/03/20 1143  BP: 130/62  Pulse: 69  SpO2: 97%  Weight: 295 lb 3.2 oz (133.9 kg)  Height: 6\' 2"  (1.88 m)   97% on 6 LPM  BMI Readings from Last 3 Encounters:  01/03/20 37.90 kg/m  09/08/19 37.91 kg/m  08/02/19 38.52 kg/m   Wt Readings from Last 3 Encounters:  01/03/20 295 lb 3.2 oz (133.9 kg)  09/08/19 295 lb 4.8 oz (133.9 kg)  08/02/19 300 lb (136.1 kg)     CBC    Component Value Date/Time   WBC 9.1 06/13/2019 1230   RBC 5.37 06/13/2019 1230   HGB 15.5 06/13/2019 1230   HCT 46.0 06/13/2019 1230   PLT 187.0 06/13/2019 1230   MCV 85.6 06/13/2019 1230   MCH 28.8 06/10/2011 0904   MCHC 33.8 06/13/2019 1230   RDW 14.2 06/13/2019 1230   LYMPHSABS 1.7 06/13/2019 1230   MONOABS 0.9 06/13/2019 1230   EOSABS 0.3 06/13/2019 1230   BASOSABS 0.1 06/13/2019 1230    Pulmonary Functions Testing Results: PFT Results Latest Ref Rng & Units 04/22/2016  FVC-Pre L 3.58  FVC-Predicted Pre % 69  FVC-Post L 3.78  FVC-Predicted Post % 73  Pre FEV1/FVC % % 49  Post FEV1/FCV % % 47  FEV1-Pre L 1.77  FEV1-Predicted Pre % 46  FEV1-Post L 1.76  DLCO UNC% % 34  DLCO COR %Predicted % 48  TLC L 6.45  TLC % Predicted % 82  RV % Predicted % 93      Assessment & Plan:     ICD-10-CM   1. Chronic respiratory failure with hypoxia (HCC)  J96.11   2. Centrilobular emphysema ([place])  J43.2   3. Chronic diastolic congestive heart failure (HCC)  I50.32   4. Medication management  Z79.899   5. Therapeutic drug monitoring  Z51.81   6. Chronic obstructive pulmonary disease, unspecified COPD type ([place])  J44.9   7. Shortness of breath  R06.02 CT Chest Wo Contrast    Assessment:   This is a 74 year old gentleman with complicated chronic hypoxemic respiratory failure multifactorial etiologies to include chronic diastolic heart failure as well as severe COPD.  Currently on maximal COPD regimen with persistent symptomatology.  Ongoing with dyspnea on  exertion and shortness of breath despite triple therapy inhaler regimen Daliresp plus azithromycin.  Plan Following Extensive Data Review & Interpretation:  . I reviewed prior external note(s) from 09/08/2019 Dr. Einar Gip cardiology office note . I reviewed the result(s) of 08/07/2019 echocardiogram LVEF 55 to 123456 grade 1 diastolic dysfunction . I have ordered noncontrasted CT of the chest for progressive dyspnea symptoms.  In the setting of advanced COPD.  Still with persistent ongoing symptoms. We discussed today the possibility of going to twice daily long-acting nebulized medications versus switching to a triple therapy inhaler. We will start by giving him samples of breztri with a spacer. Patient was instructed on how to use the inhaler today in the office. Patient was also instructed on importance of rinsing mouth out after use of an ICS.  Hopefully will be able to arrange patient being able to get CT scan of the chest at the same time he is getting the abdominal CT ordered by primary care. Patient to follow-up in our clinic in 2 months following noncontrasted CT imaging of the chest.  And trial on inhaler. He can be seen by myself or Wyn Quaker, NP.  Independent interpretation of tests . Review of patient's August 2022 view chest x-ray images revealed enlarged cardiac silhouette some faint diffuse chronic interstitial markings. The patient's images have been independently reviewed by me.  HRCT 10/09/2016 no evidence of ILD however significant severe centrilobular and paraseptal emphysema.    Current Outpatient Medications:  .  albuterol (PROAIR HFA) 108 (90 Base) MCG/ACT inhaler, INHALE 2 PUFFS BY MOUTH INTO THE LUNGS EVERY 4 HOURS AS NEEDED FOR WHEEZING OR SHORTNESS OF BREATH, Disp: 3 Inhaler, Rfl: 3 .  allopurinol (ZYLOPRIM) 300 MG tablet, Take 300 mg by mouth daily., Disp: , Rfl:  .  ALPRAZolam (XANAX) 0.5 MG tablet, Take 1 tablet (0.5 mg total) by mouth 2 (two) times daily. (Patient taking  differently: Take 0.5 mg by mouth at bedtime. ), Disp: 60 tablet, Rfl: 0 .  amLODipine (NORVASC) 10 MG tablet, Take 10 mg by mouth daily. , Disp: , Rfl:  .  atorvastatin (LIPITOR) 20 MG tablet, Take 20 mg by mouth daily., Disp: , Rfl:  .  azelastine (ASTELIN) 0.1 % nasal spray, Place 1 spray into both nostrils 2 (two) times daily. Once daily, Disp: , Rfl: 2 .  azithromycin (ZITHROMAX) 250 MG tablet, Take Monday, Wednesday, Friday only with food., Disp: 39 tablet, Rfl: 2 .  budesonide-formoterol (SYMBICORT) 160-4.5 MCG/ACT inhaler, Inhale 2 puffs into the lungs 2 (two) times daily., Disp: 3 Inhaler, Rfl: 3 .  DALIRESP 500 MCG TABS tablet, TAKE 1 TABLET EVERY DAY, Disp: 90 tablet, Rfl: 3 .  ELIQUIS 5 MG TABS tablet, Take 5 mg by mouth 2 (two) times daily., Disp: , Rfl: 0 .  fish oil-omega-3 fatty acids 1000 MG capsule, Take 1 capsule by mouth 3 (three) times daily.  , Disp: , Rfl:  .  flecainide (TAMBOCOR) 100 MG tablet, Take 100 mg by mouth 2 (two) times daily., Disp: , Rfl: 0 .  furosemide (LASIX) 40 MG tablet, Take 1 tablet (40 mg total) by mouth 2 (two) times daily as needed for fluid. Am and 3 pm, Disp: 60 tablet, Rfl: 3 .  Guaifenesin (MUCINEX MAXIMUM STRENGTH) 1200 MG TB12, Take 1,200 mg by mouth in the morning and at bedtime., Disp: , Rfl:  .  Linaclotide (LINZESS) 145 MCG CAPS capsule, Take 145 mcg by mouth every other day. , Disp: , Rfl:  .  losartan-hydrochlorothiazide (HYZAAR) 100-25 MG tablet, 1 tablet daily. , Disp: , Rfl:  .  metoprolol succinate (TOPROL-XL) 50 MG 24 hr tablet, Take 50 mg by mouth daily. Take with or immediately following a meal., Disp: , Rfl:  .  montelukast (SINGULAIR) 10 MG tablet, TAKE 1 TABLET AT BEDTIME, Disp: 90 tablet, Rfl: 2 .  Multiple Vitamin (MULITIVITAMIN WITH MINERALS) TABS, Take 1 tablet by mouth daily., Disp: , Rfl:  .  nitroGLYCERIN (NITROSTAT) 0.4 MG SL tablet, Place 0.4 mg under the tongue every 5 (five) minutes as needed for chest pain., Disp: , Rfl:   .  nystatin (MYCOSTATIN) 100000 UNIT/ML suspension, Take 5 mLs (500,000 Units total) by mouth 4 (four) times daily., Disp: 60 mL, Rfl: 0 .  potassium chloride (KLOR-CON) 10 MEQ tablet, Take 1  tablet (10 mEq total) by mouth 2 (two) times daily as needed (With furosemide)., Disp: 180 tablet, Rfl: 3 .  Respiratory Therapy Supplies (FLUTTER) DEVI, Use as directed, Disp: 1 each, Rfl: 0 .  rOPINIRole (REQUIP) 0.5 MG tablet, Take 1 tablet (0.5 mg total) by mouth daily., Disp: 30 tablet, Rfl: 0 .  temazepam (RESTORIL) 15 MG capsule, Take 15 mg by mouth at bedtime as needed for sleep. , Disp: , Rfl:  .  Tiotropium Bromide Monohydrate (SPIRIVA RESPIMAT) 2.5 MCG/ACT AERS, Inhale 2 puffs into the lungs daily., Disp: 12 g, Rfl: 1 .  traMADol (ULTRAM) 50 MG tablet, Take 1 tablet (50 mg total) by mouth 3 (three) times daily as needed for moderate pain., Disp: 90 tablet, Rfl: 0 .  vitamin C (ASCORBIC ACID) 500 MG tablet, Take 500 mg by mouth daily., Disp: , Rfl:    Garner Nash, DO [facility] 01/03/2020 11:51 AM

## 2020-01-05 ENCOUNTER — Other Ambulatory Visit: Payer: Self-pay | Admitting: Physician Assistant

## 2020-01-05 DIAGNOSIS — R109 Unspecified abdominal pain: Secondary | ICD-10-CM

## 2020-01-15 ENCOUNTER — Other Ambulatory Visit: Payer: Medicare Other

## 2020-01-18 ENCOUNTER — Ambulatory Visit
Admission: RE | Admit: 2020-01-18 | Discharge: 2020-01-18 | Disposition: A | Discharge status: Home / Self Care | Payer: Medicare Other | Source: Ambulatory Visit | Attending: Physician Assistant | Admitting: Physician Assistant

## 2020-01-18 ENCOUNTER — Ambulatory Visit
Admission: RE | Admit: 2020-01-18 | Discharge: 2020-01-18 | Disposition: A | Discharge status: Home / Self Care | Payer: Medicare Other | Source: Ambulatory Visit | Attending: Pulmonary Disease | Admitting: Pulmonary Disease

## 2020-01-18 DIAGNOSIS — C642 Malignant neoplasm of left kidney, except renal pelvis: Secondary | ICD-10-CM | POA: Diagnosis not present

## 2020-01-18 DIAGNOSIS — R109 Unspecified abdominal pain: Secondary | ICD-10-CM

## 2020-01-18 DIAGNOSIS — R0602 Shortness of breath: Secondary | ICD-10-CM

## 2020-01-18 DIAGNOSIS — J449 Chronic obstructive pulmonary disease, unspecified: Secondary | ICD-10-CM | POA: Diagnosis not present

## 2020-01-18 DIAGNOSIS — J439 Emphysema, unspecified: Secondary | ICD-10-CM | POA: Diagnosis not present

## 2020-01-18 DIAGNOSIS — Z23 Encounter for immunization: Secondary | ICD-10-CM | POA: Diagnosis not present

## 2020-01-18 MED ORDER — IOPAMIDOL (ISOVUE-300) INJECTION 61%
125.0000 mL | Freq: Once | INTRAVENOUS | Status: AC | PRN
  Administered 2020-01-18: 125 mL via INTRAVENOUS

## 2020-01-23 ENCOUNTER — Other Ambulatory Visit: Payer: Self-pay | Admitting: Cardiology

## 2020-01-23 DIAGNOSIS — M179 Osteoarthritis of knee, unspecified: Secondary | ICD-10-CM

## 2020-01-23 DIAGNOSIS — M1710 Unilateral primary osteoarthritis, unspecified knee: Secondary | ICD-10-CM

## 2020-01-26 ENCOUNTER — Other Ambulatory Visit: Payer: Self-pay

## 2020-01-26 DIAGNOSIS — M179 Osteoarthritis of knee, unspecified: Secondary | ICD-10-CM

## 2020-01-26 DIAGNOSIS — M1710 Unilateral primary osteoarthritis, unspecified knee: Secondary | ICD-10-CM

## 2020-01-26 MED ORDER — TRAMADOL HCL 50 MG PO TABS: ORAL_TABLET | ORAL | 0 refills | Status: AC

## 2020-02-15 DIAGNOSIS — Z23 Encounter for immunization: Secondary | ICD-10-CM | POA: Diagnosis not present

## 2020-02-27 IMAGING — DX DG CHEST 2V
2 series · 3 of 3 positions shown · non-contrast
Comparison: 08/05/2016

CLINICAL DATA: Pulmonary emphysema short of breath

EXAM:
CHEST - 2 VIEW

[Series 1: chest pa · 0.14mm/px · 2 of 2 slices shown]
[im 1/2]
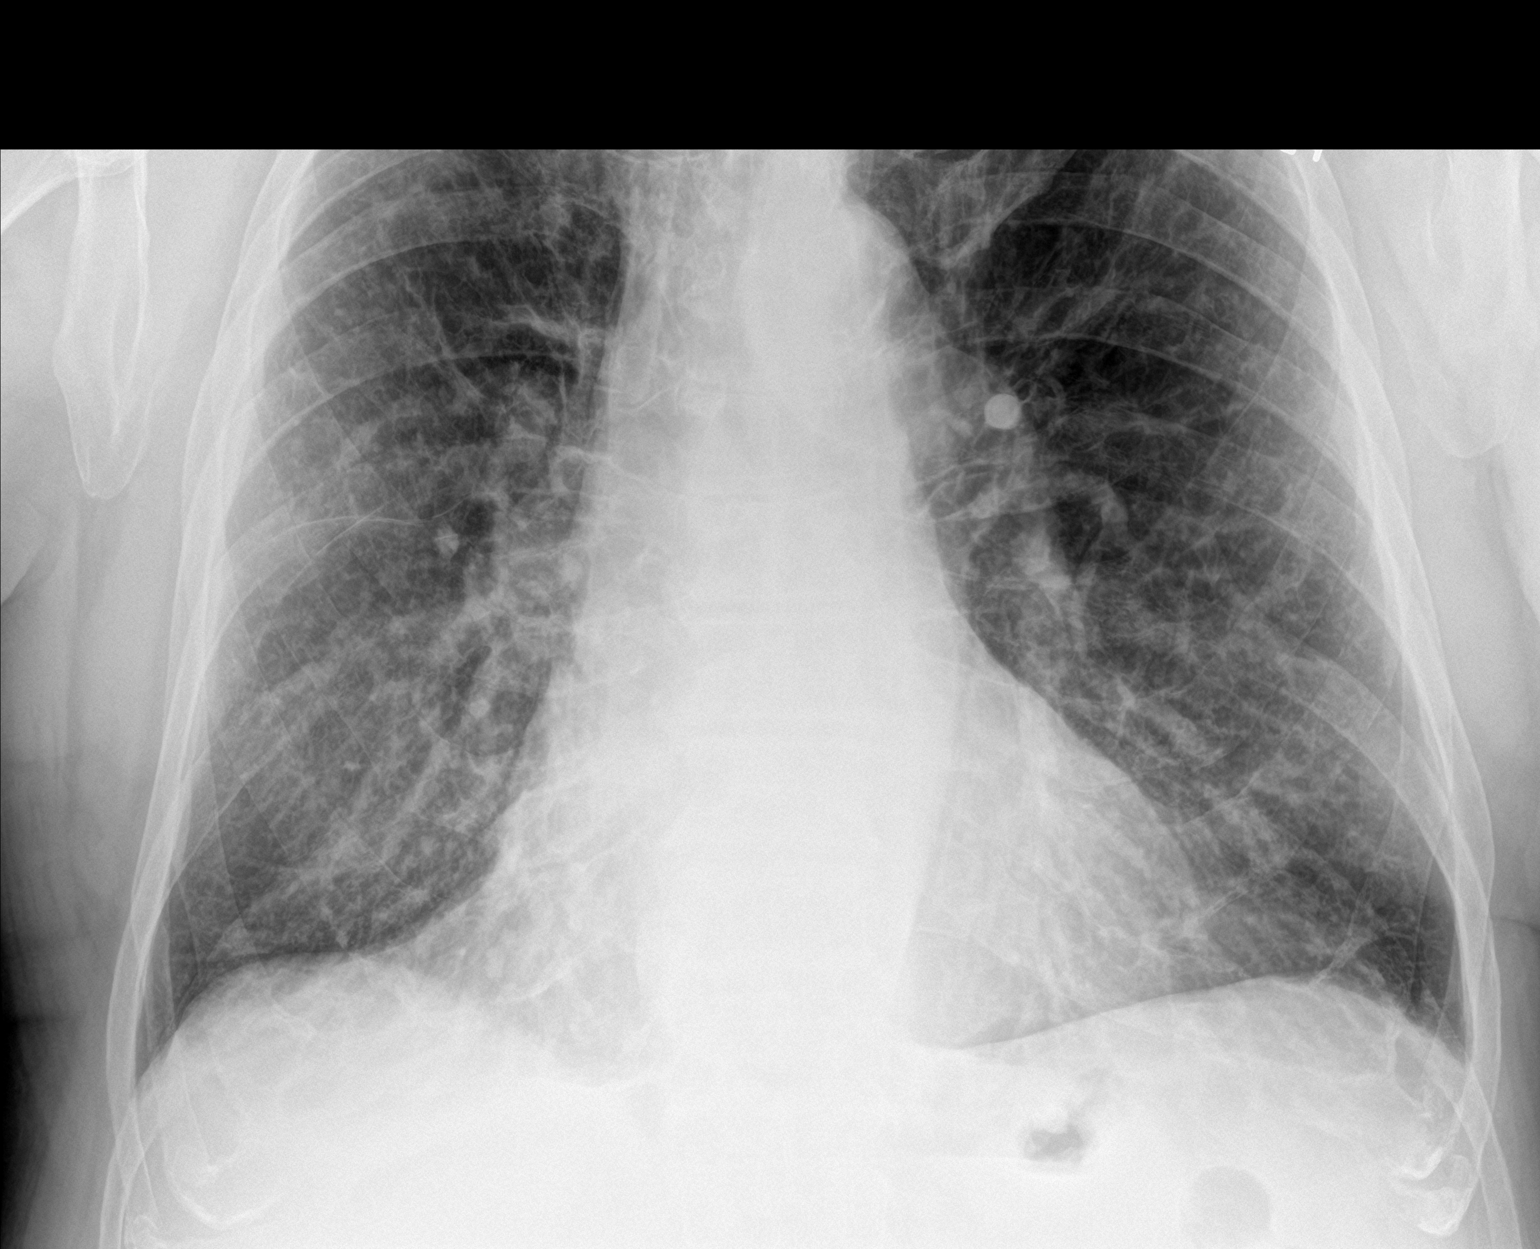
[im 2/2]
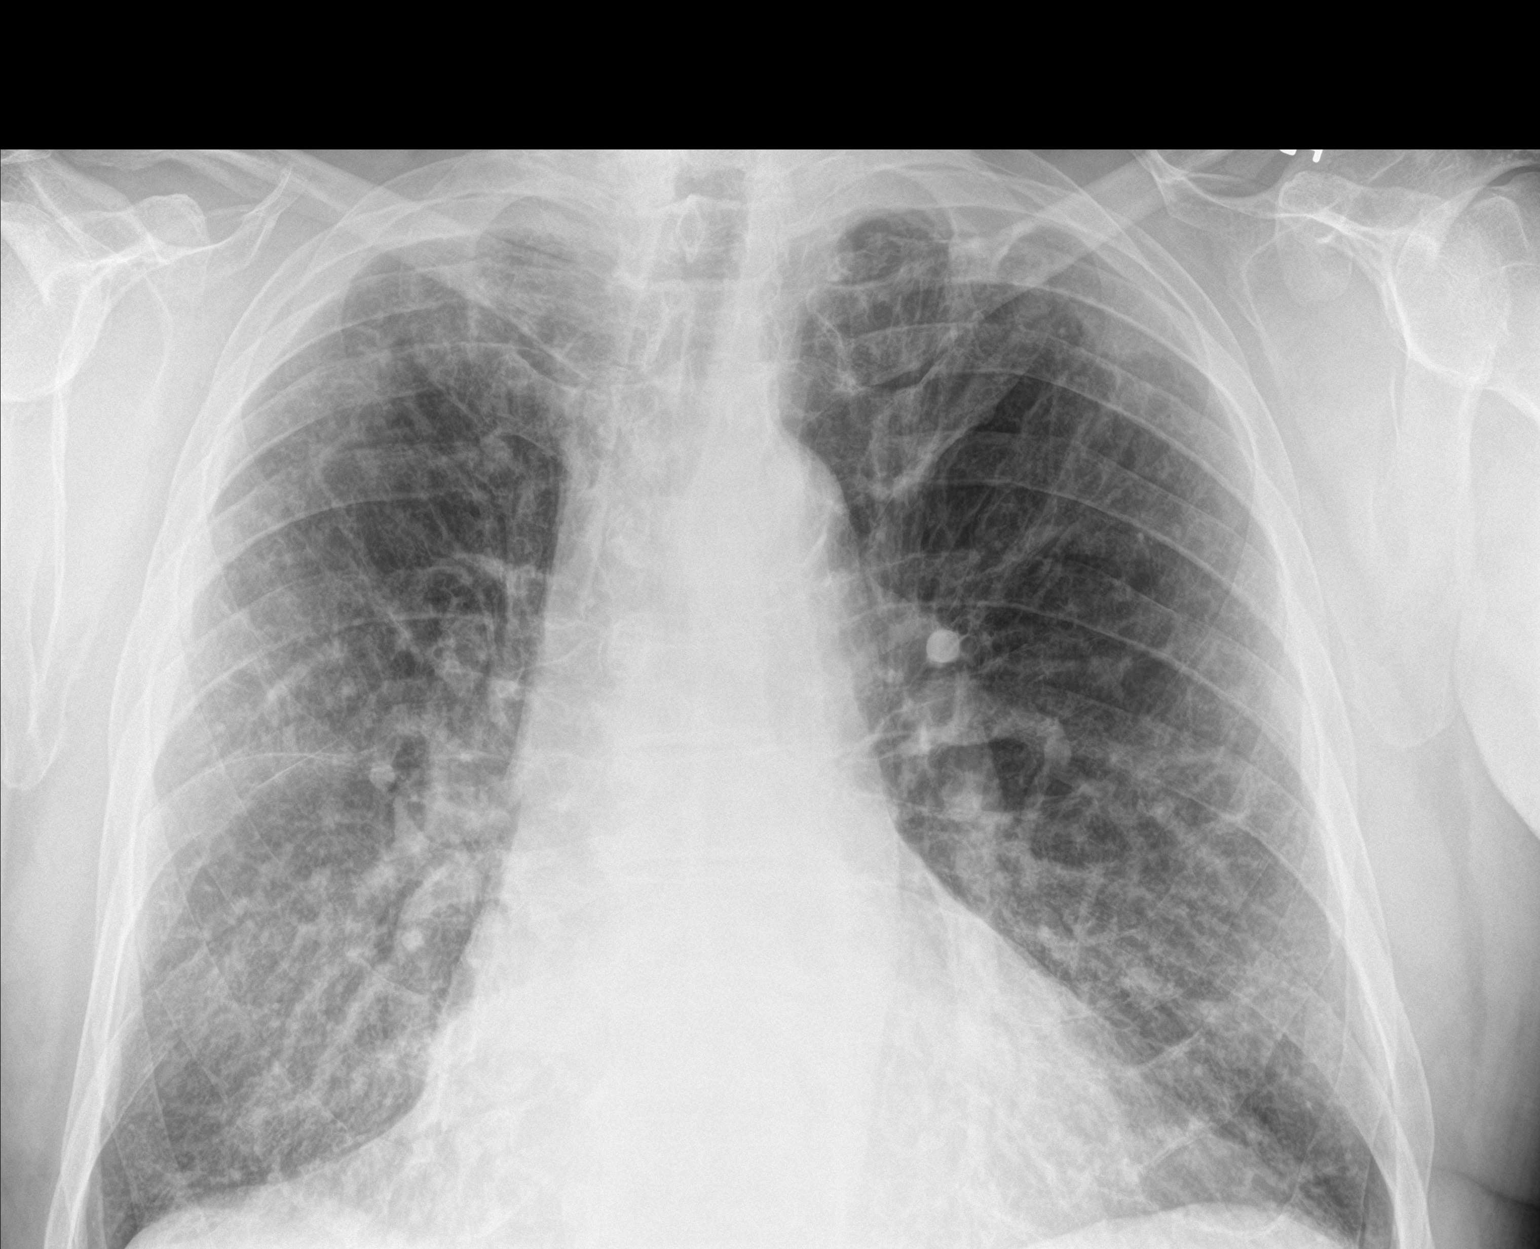

[chest lat]
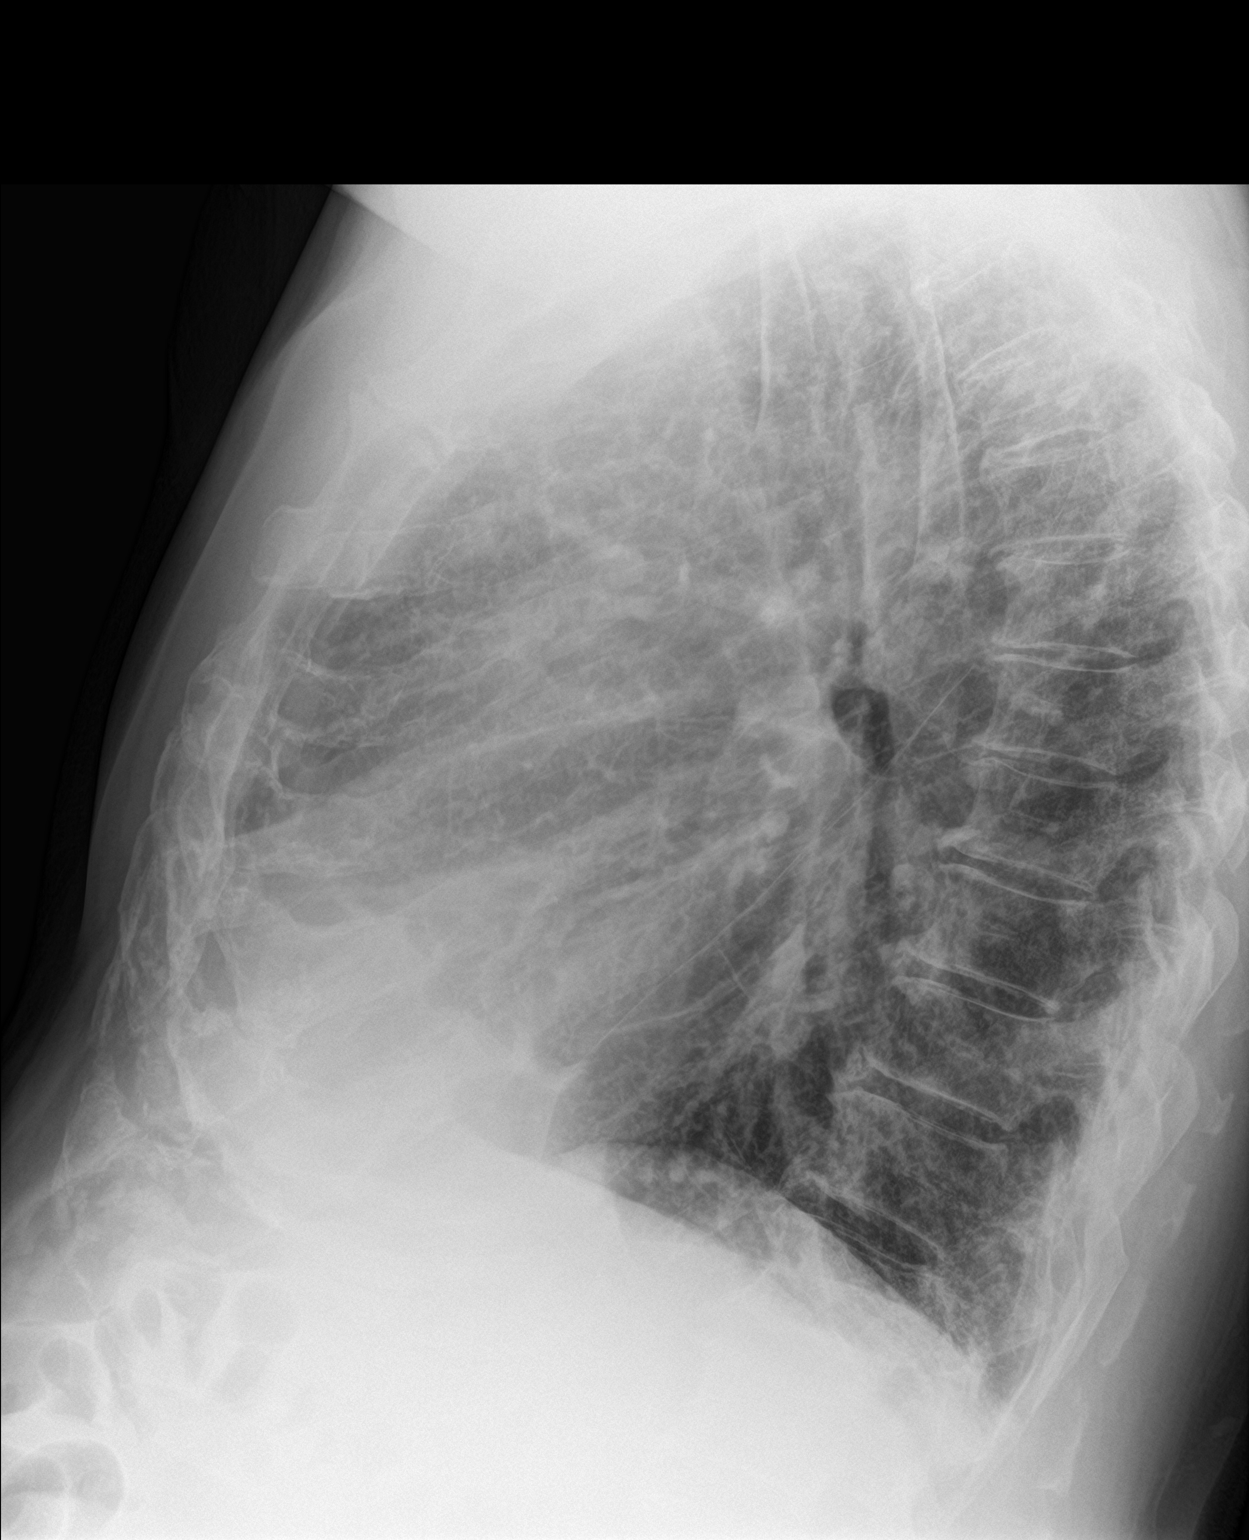

[3 of 3 positions shown; findings below may reference images not displayed]

FINDINGS: COPD. Pulmonary hyperinflation and pulmonary scarring bilaterally.
Heart size upper normal. Negative for heart failure or pneumonia. No
pleural effusion. Extensive apical scarring bilaterally unchanged.
IMPRESSION: Advanced COPD without acute abnormality.  No interval change.

## 2020-03-03 ENCOUNTER — Other Ambulatory Visit: Payer: Self-pay | Admitting: Cardiology

## 2020-03-05 NOTE — Telephone Encounter (Signed)
Refill request

## 2020-03-06 NOTE — Progress Notes (Signed)
Primary Physician/Referring:  Alroy Dust, L.[name], MD  Patient ID: Harold Hall, male    DOB: 01-27-1946, 74 y.o.   MRN: [id number]  Chief Complaint  Patient presents with  . Congestive Heart Failure  . Hypertension  . Follow-up    6 month   HPI:    Harold Hall  is a 74 y.o. Caucasian male with HTN, COPD on home O2 (recently increased to 6L), and HLD, paroxysmal A. Fibrillation maintaing sinus rhythm since being on flecanide in June 2017. Single kidney S/P left nephrectomy for renal cell carcinoma. He follows Dr. Leroy Sea Icard from pulmonary standpoint and management of COPD, coronary angiography on 09/21/2018 had revealed no significant coronary disease.  He was rejected for transplant. Now on 4-6 L of oxygen.  CT scan of the chest and abdomen on 01/18/2020 had revealed extensive centrilobular emphysema and three-vessel coronary calcification.  He presents here for 74-month follow-up visit for chronic diastolic heart failure, continues to be dependent on home oxygen.  Edema is resolved since being on furosemide twice daily.  He has developed severe back pain and also leg cramps.  Continues to lose weight and is trying his best to make lifestyle changes.  Past Medical History:  Diagnosis Date  . Allergic rhinitis   . Arthritis   . Cancer [facility])    left renal -solitary kidney  . COPD (chronic obstructive pulmonary disease) ([place])   . Erectile dysfunction   . Gout   . Hernia   . History of kidney cancer   . Hypercholesterolemia   . Hypertension   . RLS (restless legs syndrome)   . Sinus problem    Past Surgical History:  Procedure Laterality Date  . CARDIAC CATHETERIZATION    . HERNIA REPAIR  06/04/11  . left kidney removed  1994   Family History  Problem Relation Age of Onset  . COPD Mother   . Hypertension Mother   . Osteoporosis Mother   . Prostate cancer Father   . Cancer Father        prostate    Social History   Tobacco Use  . Smoking status: Former Smoker     Packs/day: 2.00    Years: 50.00    Pack years: 100.00    Types: Cigarettes    Quit date: 06/04/2011    Years since quitting: 8.7  . Smokeless tobacco: Never Used  Substance Use Topics  . Alcohol use: Yes    Alcohol/week: 2.0 - 3.0 standard drinks    Types: 2 - 3 Cans of beer per week    Comment: beers   Marital Status: Married  ROS  Review of Systems  Cardiovascular: Positive for claudication and leg swelling (mild). Negative for palpitations.  Respiratory: Positive for shortness of breath, snoring and wheezing.   Hematologic/Lymphatic: Does not bruise/bleed easily.  Musculoskeletal: Positive for arthritis and back pain.  Psychiatric/Behavioral: Negative for depression.   Objective  Blood pressure 130/63, pulse 74, temperature (!) 97.2 F (36.2 C), temperature source Temporal, resp. rate 17, height 6\' 2"  (1.88 m), weight 285 lb (129.3 kg), SpO2 93 %.  Vitals with BMI 03/08/2020 01/03/2020 09/08/2019  Height 6\' 2"  6\' 2"  6\' 2"   Weight 285 lbs 295 lbs 3 oz 295 lbs 5 oz  BMI 36.58 [id number]  Systolic [id number]  Diastolic 63 62 63  Pulse 74 69 72     Physical Exam  Constitutional: He appears well-developed and well-nourished.  Morbidly obese. On nasal cannula O2.  HENT:  Head: Atraumatic.  Eyes: Conjunctivae are normal.  Neck: No JVD present. No thyromegaly present.  Short neck and difficult to evaluate JVP  Cardiovascular: Normal rate and regular rhythm. Exam reveals no gallop.  No murmur heard. Pulses:      Carotid pulses are 2+ on the right side and 2+ on the left side.      Dorsalis pedis pulses are 2+ on the right side and 2+ on the left side.       Posterior tibial pulses are 2+ on the right side and 0 on the left side.  popliteal pulse difficult to feel due to patient's body habitus.   Trace leg edema. No JVD.    Pulmonary/Chest: Effort normal. No accessory muscle usage. No respiratory distress. He has wheezes (faint bilateral expiratory wheezing).  Obese. Pannus  present  Abdominal: Soft. Bowel sounds are normal.  Musculoskeletal:        General: Normal range of motion.     Cervical back: Neck supple.  Neurological: He is alert.  Skin: Skin is warm and dry.  Psychiatric: He has a normal mood and affect.   Laboratory examination:   Recent Labs    06/13/19 1230 08/07/19 1023  NA 141 146*  K 3.9 4.2  CL 98 100  CO2 39* 32*  GLUCOSE 92 106*  BUN 21 27  CREATININE 1.31 1.71*  CALCIUM 10.0 10.1  GFRNONAA  --  39*  GFRAA  --  45*   CrCl cannot be calculated (Patient's most recent lab result is older than the maximum 21 days allowed.).  CMP Latest Ref Rng & Units 08/07/2019 06/13/2019 06/10/2011  Glucose 65 - 99 mg/dL 106(H) 92 118(H)  BUN 8 - 27 mg/dL 27 21 22   Creatinine 0.76 - 1.27 mg/dL 1.71(H) 1.31 1.34  Sodium 134 - 144 mmol/L 146(H) 141 143  Potassium 3.5 - 5.2 mmol/L 4.2 3.9 4.1  Chloride 96 - 106 mmol/L 100 98 101  CO2 20 - 29 mmol/L 32(H) 39(H) 31  Calcium 8.6 - 10.2 mg/dL 10.1 10.0 9.3  Total Protein 6.0 - 8.3 g/dL - 7.6 -  Total Bilirubin 0.2 - 1.2 mg/dL - 0.8 -  Alkaline Phos 39 - 117 U/L - 100 -  AST 0 - 37 U/L - 22 -  ALT 0 - 53 U/L - 26 -   CBC Latest Ref Rng & Units 06/13/2019 10/06/2016 06/10/2011  WBC 4.0 - 10.5 K/uL 9.1 8.4 7.9  Hemoglobin 13.0 - 17.0 g/dL 15.5 16.8 11.2(L)  Hematocrit 39.0 - 52.0 % 46.0 49.6 33.7(L)  Platelets 150.0 - 400.0 K/uL 187.0 190.0 219   Lipid Panel  No results found for: CHOL, TRIG, HDL, CHOLHDL, VLDL, LDLCALC, LDLDIRECT HEMOGLOBIN A1C No results found for: HGBA1C, MPG TSH No results for input(s): TSH in the last 8760 hours.  External labs:  09/21/2018:  Total cholesterol 140, triglycerides 134, HDL 40, LDL 68.1. 06/13/2019: NT-Pro BNP 71   Medications and allergies  No Known Allergies   Current Outpatient Medications  Medication Instructions  . albuterol (PROAIR HFA) 108 (90 Base) MCG/ACT inhaler INHALE 2 PUFFS BY MOUTH INTO THE LUNGS EVERY 4 HOURS AS NEEDED FOR WHEEZING OR  SHORTNESS OF BREATH  . allopurinol (ZYLOPRIM) 300 mg, Daily  . ALPRAZolam (XANAX) 0.5 mg, Oral, 2 times daily  . amLODipine (NORVASC) 10 mg, Daily  . atorvastatin (LIPITOR) 20 mg, Oral, Daily  . azelastine (ASTELIN) 0.1 % nasal spray 1 spray, Each Nare, 2 times daily, Once daily  . azithromycin (  ZITHROMAX) 250 MG tablet Take Monday, Wednesday, Friday only with food.  . Budeson-Glycopyrrol-Formoterol (BREZTRI AEROSPHERE) 160-9-4.8 MCG/ACT AERO 2 puffs, Inhalation, 2 times daily  . budesonide-formoterol (SYMBICORT) 160-4.5 MCG/ACT inhaler 2 puffs, Inhalation, 2 times daily  . DALIRESP 500 MCG TABS tablet TAKE 1 TABLET EVERY DAY  . ELIQUIS 5 MG TABS tablet TAKE 1 TABLET TWO TIMES DAILY  . fish oil-omega-3 fatty acids 1000 MG capsule 1 capsule, 3 times daily  . flecainide (TAMBOCOR) 100 MG tablet TAKE 1 (ONE) TABLET TWO TIMES DAILY  . furosemide (LASIX) 40 mg, Oral, 2 times daily PRN, Am and 3 pm  . Guaifenesin (MUCINEX MAXIMUM STRENGTH) 1,200 mg, Oral, 2 times daily  . linaclotide (LINZESS) 145 mcg, Oral, Every other day  . losartan-hydrochlorothiazide (HYZAAR) 100-25 MG tablet 1 tablet, Daily  . metoprolol succinate (TOPROL-XL) 50 mg, Daily  . montelukast (SINGULAIR) 10 MG tablet TAKE 1 TABLET AT BEDTIME  . Multiple Vitamin (MULITIVITAMIN WITH MINERALS) TABS 1 tablet, Daily  . nitroGLYCERIN (NITROSTAT) 0.4 mg, Sublingual, Every 5 min PRN  . nystatin (MYCOSTATIN) 500,000 Units, Oral, 4 times daily  . potassium chloride (KLOR-CON) 10 MEQ tablet 10 mEq, Oral, 2 times daily PRN  . Respiratory Therapy Supplies (FLUTTER) DEVI Use as directed  . rOPINIRole (REQUIP) 0.5 mg, Oral, Daily  . temazepam (RESTORIL) 15 mg, At bedtime PRN  . Tiotropium Bromide Monohydrate (SPIRIVA RESPIMAT) 2.5 MCG/ACT AERS 2 puffs, Inhalation, Daily  . traMADol (ULTRAM) 50 MG tablet TAKE 1 TABLET BY MOUTH THREE TIMES DAILY AS NEEDED FOR MODERATE PAIN  . vitamin C (ASCORBIC ACID) 500 mg, Oral, Daily   Radiology:   CT  angio 05/22/2011: Mediastinal lymph nodes are not enlarged by CT size criteria. No hilar or axillary adenopathy. Atherosclerotic calcification of the arterial vasculature, including coronary arteries. Heart size normal. Anterior pericardial fluid or thickening. Pulmonary arteries are enlarged. Biapical pleural parenchymal scarring, left greater than right. Emphysema. Focal mucoid impaction in the anterior segment right upper lobe (image 25). Lungs are otherwise clear. No pleural fluid. Scattered debris in the trachea.   CT Chest Wo Contrast 01/18/2020: - Advanced bullous type emphysema with biapical pleuroparenchymal scarring. Emphysema. - Pulmonary artery enlargement suggests pulmonary arterial hypertension. - Coronary artery atherosclerosis. Aortic Atherosclerosis.   Cardiac Studies:   Chest X-Ray 04/21/2016: 1. Stable cardiomegaly.  No pulmonary venous congestion. 2. Stable chronic interstitial changes consistent with interstitial fibrosis  Echocardiogram 05/07/2016: Left ventricle cavity is normal in size. Moderate concentric hypertrophy of the left ventricle. Hyperdynamic LV systolic function with cavitary obliteration. Suspect intraventricular pressure gradient. Doppler evidence of grade I (impaired) diastolic dysfunction. Visual EF is 65-70%. Left atrial cavity is severely dilated at 5.5cm. Moderate tricuspid regurgitation. Moderate pulmonary hypertension. Pulmonary artery systolic pressure is estimated at 38mm Hg. Insignificant pericardial effusion. IVC is dilated with respiratory variation. This suggests elevated right heart pressure. Compared to the study done on 02/17/2014, LA size was mildly dilated and Pul HTN new.  Lexiscan myoview stress test 05/01/2016: 1. The resting electrocardiogram demonstrated atrial fibrillation, normal resting conduction and nonspecific ST-T changes. Stress EKG is non-diagnostic for ischemia as it a pharmacologic stress using Lexiscan. Stress symptoms included  dyspnea. 2. The perfusion imaging study demonstrates mild diaphragmatic attenuation artifact in the inferior wall, there is no evidence of ischemia or scar. Left ventricle systolic function calculated by QGS was 86%. This is a low risk study. No significant change from 08/31/2014.  Carotid artery duplex 02/01/2018: No hemodynamically significant arterial disease in the internal carotid artery  bilaterally. Right vertebral artery flow is not visualized. Left vertebral artery flow is not visualized. Compared to study done on 09/26/2014, left ICA stenosis of less than 50% not present. Previously vertebral arteries were well visualized.  Sleep Study [facility] 10/02/2018: Sleep Apnea mild. 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 Moderate pulmonary hypertension, mean pressure 32 mmHg.  Event Monitor 2 weeks 09/04/19 -09/10/2019: No symptoms reported. NSR.   Echocardiogram 08/07/2019: Inadequate visualization due to suboptimal acoustic windows.  Left ventricle cavity is normal in size. Moderate concentric hypertrophy of the left ventricle. Normal LV systolic function with visual EF 55-60%. Normal global wall motion. Doppler evidence of grade I (impaired) diastolic dysfunction, normal LAP.  Mild (Grade I) mitral regurgitation. Inadequate TR jet to estimate pulmonary artery systolic pressure.  IVC is dilated with respiratory variation. Estimated RA pressure 8 mmHg Left atrial dilatation and pulmonary hypertension noted in 2017 not well appreciated on this study. This could be attributed to quality of the study. Consider alternate testing modality, if clinical suspicion is high.  EKG  EKG 03/08/2020: Sinus rhythm with first-degree AV block at rate of 72 bpm, left atrial enlargement, left axis deviation, left anterior fascicular block.  Poor R wave progression, cannot exclude  anteroseptal infarct old.  No evidence of ischemia, normal QT interval.   No significant change from 09/08/2019  Assessment     ICD-10-CM   1. Chronic diastolic (congestive) heart failure (HCC)  I50.32 EKG 12-Lead  2. Chronic respiratory failure with hypoxia (HCC)  J96.11   3. Essential hypertension  I10   4. Bifascicular block  I45.2   5. Stage 3b chronic kidney disease  N18.32   6. Coronary artery calcification seen on CAT scan 01/28/20  I25.10      No orders of the defined types were placed in this encounter.   There are no discontinued medications.  Recommendations:   Harold Hall  is a 74 y.o.  Caucasian male with HTN, COPD on home O2 (recently increased to 6L), and HLD, paroxysmal A. Fibrillation maintaing sinus rhythm since being on flecanide in June 2017. Single kidney S/P left nephrectomy for renal cell carcinoma. He follows Dr. Leroy Sea Icard from pulmonary standpoint and management of COPD, coronary angiography on 09/21/2018 had revealed no significant coronary disease.  He was rejected for transplant. Now on 4-6 L of oxygen.  CT scan of the chest and abdomen on 01/18/2020 had revealed extensive centrilobular emphysema and three-vessel coronary calcification.  He is presently doing well and he has not had acute decompensated heart failure, leg edema has essentially resolved, continue twice daily dosing of furosemide especially in view of single kidney and chronic renal disease and chronic cor pulmonale.  Blood pressure is well controlled, no change in his EKG and he continues to have bifascicular block.  Reviewed his labs, CBC and serum creatinine is remained stable, continue Eliquis for atrial fibrillation with regard to coronary calcification noted on the CT scan, would recommend him be on a statin, I do not have his labs and still request his PCP to follow-up on this and consider adding a low-dose of a statin.  Adrian Prows, MD, [facility] 03/08/2020, 10:19 AM [facility]  [place] Office: [phone]

## 2020-03-08 ENCOUNTER — Encounter: Payer: Self-pay | Admitting: Cardiology

## 2020-03-08 ENCOUNTER — Other Ambulatory Visit: Payer: Self-pay

## 2020-03-08 ENCOUNTER — Ambulatory Visit: Payer: Medicare Other | Admitting: Cardiology

## 2020-03-08 VITALS — BP 130/63 | HR 74 | Temp 97.2°F | Resp 17 | Ht 74.0 in | Wt 285.0 lb

## 2020-03-08 DIAGNOSIS — I10 Essential (primary) hypertension: Secondary | ICD-10-CM | POA: Diagnosis not present

## 2020-03-08 DIAGNOSIS — I5032 Chronic diastolic (congestive) heart failure: Secondary | ICD-10-CM

## 2020-03-08 DIAGNOSIS — I2510 Atherosclerotic heart disease of native coronary artery without angina pectoris: Secondary | ICD-10-CM | POA: Diagnosis not present

## 2020-03-08 DIAGNOSIS — J9611 Chronic respiratory failure with hypoxia: Secondary | ICD-10-CM | POA: Diagnosis not present

## 2020-03-08 DIAGNOSIS — N1832 Chronic kidney disease, stage 3b: Secondary | ICD-10-CM

## 2020-03-08 DIAGNOSIS — I452 Bifascicular block: Secondary | ICD-10-CM

## 2020-03-12 ENCOUNTER — Ambulatory Visit: Payer: Medicare Other | Admitting: Pulmonary Disease

## 2020-03-20 NOTE — Progress Notes (Signed)
Sounds good man.  Harold Hall

## 2020-03-27 ENCOUNTER — Encounter: Payer: Self-pay | Admitting: Pulmonary Disease

## 2020-03-27 ENCOUNTER — Other Ambulatory Visit: Payer: Self-pay

## 2020-03-27 ENCOUNTER — Ambulatory Visit (INDEPENDENT_AMBULATORY_CARE_PROVIDER_SITE_OTHER): Payer: Medicare Other | Admitting: Pulmonary Disease

## 2020-03-27 VITALS — BP 120/60 | HR 68 | Temp 98.3°F | Ht 74.0 in | Wt 304.8 lb

## 2020-03-27 DIAGNOSIS — B370 Candidal stomatitis: Secondary | ICD-10-CM

## 2020-03-27 DIAGNOSIS — J9611 Chronic respiratory failure with hypoxia: Secondary | ICD-10-CM

## 2020-03-27 DIAGNOSIS — Z79899 Other long term (current) drug therapy: Secondary | ICD-10-CM | POA: Diagnosis not present

## 2020-03-27 DIAGNOSIS — J432 Centrilobular emphysema: Secondary | ICD-10-CM

## 2020-03-27 MED ORDER — NYSTATIN 100000 UNIT/ML MT SUSP: 5.0000 mL | Freq: Four times a day (QID) | OROMUCOSAL | 0 refills | Status: DC

## 2020-03-27 MED ORDER — BREZTRI AEROSPHERE 160-9-4.8 MCG/ACT IN AERO: 2.0000 | INHALATION_SPRAY | Freq: Two times a day (BID) | RESPIRATORY_TRACT | 0 refills | Status: DC

## 2020-03-27 NOTE — Addendum Note (Signed)
Addended by: Mathis Bud on: 03/27/2020 12:24 PM   Modules accepted: Orders

## 2020-03-27 NOTE — Progress Notes (Addendum)
@Patient  ID: Harold Hall, male    DOB: 10/19/1946, 74 y.o.   MRN: [id number]  Chief Complaint  Patient presents with  . Follow-up    sob-same, cough-clear,sl. yellow    Referring provider: Alroy Dust, L.[name], MD  HPI:  74 year old male former smoker followed in our office for COPD  PMH: Abdominal pain, restless leg syndrome, A. fib, edema Smoker/ Smoking History: Former Smoker.  Quit 2012.  75-pack-year smoking history. Maintenance:  Symbicort 160, Spiriva Resp 2.5, Daliresp, MWF azithromycin 250 mg tablet Pt of: Dr. Valeta Harms  03/27/2020  - Visit   74 year old male former smoker followed in our office for chronic respiratory failure and COPD.  Patient presenting to office today as a follow-up visit from establishing care with Dr. Valeta Harms in February/2021.  He continues to be maintained on Symbicort 160 and Spiriva Respimat 2.5, Daliresp as well as Monday Wednesday Friday azithromycin.  Patient was given a trial of Breztri at last office visit but was unsure if he had much clinical benefit.  He does admit that this was also complicated with worsened allergies during this time.  He is interested in having another trial.  We can discuss this today.  Patient has not had much physical activity with the ongoing COVID-19 pandemic. He continues to do arm exercises at home.  He is not walking much.  Walk today in office patient was stable without oxygen desaturations on 6 L.  Patient is deconditioned.  He was previously managed at pulmonary rehab prior to COVID-19.  We will discuss this today.  Patient has done some research and is interested in the [place] procedure.  He is wondering if he would be a candidate.  Questionaires / Pulmonary Flowsheets:    MMRC: mMRC Dyspnea Scale mMRC Score  03/27/2020 3    Epworth:  No flowsheet data found.  Tests:   Imaging: December 2017 chest CT images showing significant emphysema with an upper lobe predominance, no clear interstitial lung  disease.  01/18/2020-CT chest without contrast-no acute process or evidence of metastatic disease in the chest, advanced bullous type emphysema with biapical pleural-parenchymal scarring, pulmonary artery enlargement suggesting PAH, coronary artery sclerosis, aortic arthrosclerosis  Blood: 09/2016 IgE 4 08/15/2018 Hgb 16.9 mg/dL  Echo: 08/15/2018 LVEF > 55%, mod LVH, dilated left atrial, normal RV, no shunt 08/07/2019-echocardiogram-LV ejection fraction 55 to [id number], grade 1 diastolic dysfunction,  PFT: Spiro 2007:  FEV1 2.10 (46%), ratio 47 PFT 2017 Ratio 47%, FEV1 1.76L (46% pred), FVC 3.78L, TLC 6.45L (82% pred), DLCO 13.18   FENO:  No results found for: NITRICOXIDE  PFT: PFT Results Latest Ref Rng & Units 04/22/2016  FVC-Pre L 3.58  FVC-Predicted Pre % 69  FVC-Post L 3.78  FVC-Predicted Post % 73  Pre FEV1/FVC % % 49  Post FEV1/FCV % % 47  FEV1-Pre L 1.77  FEV1-Predicted Pre % 46  FEV1-Post L 1.76  DLCO UNC% % 34  DLCO COR %Predicted % 48  TLC L 6.45  TLC % Predicted % 82  RV % Predicted % 93    WALK:  SIX MIN WALK 06/23/2018 03/30/2017 07/08/2011  Medications none - -  Supplimental Oxygen during Test? (L/min) Yes Yes No  O2 Flow Rate 15 5 -  Type Continuous Pulse -  Laps 17 - -  Partial Lap (in Meters) 8 - -  Baseline Heartrate 71 - -  Baseline Dyspnea (Borg Scale) 3 - -  Baseline Fatigue (Borg Scale) 3 - -  Baseline SPO2 88 - -  Heartrate 90 - -  Dyspnea (Borg Scale) 4 - -  Fatigue (Borg Scale) 4 - -  SPO2 86 - -  Heartrate 76 - -  SPO2 90 - -  Stopped or Paused before Six Minutes Yes - -  Other Symptoms at end of Exercise   - -  Interpretation Dizziness - -  Distance Completed 824 - -  Tech Comments: Patient at 2 mins was 77% on 4L; and at 27mins in walk was on 15L at 80% pt walked 1 lap at a moderate pace, did not complete laps 2 and 3 d/t sob and dropping 02 sats.  -    Imaging: No results found.  Lab Results:  CBC    Component Value Date/Time   WBC  9.1 06/13/2019 1230   RBC 5.37 06/13/2019 1230   HGB 15.5 06/13/2019 1230   HCT 46.0 06/13/2019 1230   PLT 187.0 06/13/2019 1230   MCV 85.6 06/13/2019 1230   MCH 28.8 06/10/2011 0904   MCHC 33.8 06/13/2019 1230   RDW 14.2 06/13/2019 1230   LYMPHSABS 1.7 06/13/2019 1230   MONOABS 0.9 06/13/2019 1230   EOSABS 0.3 06/13/2019 1230   BASOSABS 0.1 06/13/2019 1230    BMET    Component Value Date/Time   NA 146 (H) 08/07/2019 1023   K 4.2 08/07/2019 1023   CL 100 08/07/2019 1023   CO2 32 (H) 08/07/2019 1023   GLUCOSE 106 (H) 08/07/2019 1023   GLUCOSE 92 06/13/2019 1230   BUN 27 08/07/2019 1023   CREATININE 1.71 (H) 08/07/2019 1023   CREATININE 1.34 06/10/2011 0904   CALCIUM 10.1 08/07/2019 1023   GFRNONAA 39 (L) 08/07/2019 1023   GFRAA 45 (L) 08/07/2019 1023    BNP    Component Value Date/Time   BNP 16.0 08/07/2019 1023    ProBNP    Component Value Date/Time   PROBNP 71 06/13/2019 1230   PROBNP 459.50 (H) 04/21/2016 1237    Specialty Problems      Pulmonary Problems   COPD (chronic obstructive pulmonary disease) with emphysema ([place])    Spiro 2007:  FEV1 2.10 (46%), ratio 47 Stopped smoking 05/2011 Pulm rehab 2012, 2016 On exertional oxygen with portable concentrator. +response to daliresp.  PFT 2017 Ratio 47%, FEV1 1.76L (46% pred), FVC 3.78L, TLC 6.45L (82% pred), DLCO 13.18  05/10/18 - started chronic prof azithromycin      Pulmonary nodule    Ct chest 05/2011:  Nodule questioned on cxr not seen.       COPD exacerbation (HCC)   Chronic respiratory failure with hypoxia (HCC)    3L with exertion, 4-5 with heavy exertoin      Dyspnea    09/2016 HRCT > diffuse bronchial wall thickening and emphysema, no ILD, apical capping noted         No Known Allergies  Immunization History  Administered Date(s) Administered  . Fluad Quad(high Dose 65+) 08/02/2019  . Influenza Split 08/13/2011, 08/21/2012, 08/17/2014, 08/24/2015  . Influenza Whole 08/12/2009,  08/09/2010  . Influenza, High Dose Seasonal PF 08/09/2013, 08/13/2016, 09/05/2017, 07/08/2018  . PFIZER SARS-COV-2 Vaccination 01/18/2020, 02/15/2020  . Pneumococcal Conjugate-13 08/17/2014  . Pneumococcal Polysaccharide-23 08/10/2007    Past Medical History:  Diagnosis Date  . Allergic rhinitis   . Arthritis   . Cancer [facility])    left renal -solitary kidney  . COPD (chronic obstructive pulmonary disease) ([place])   . Erectile dysfunction   . Gout   . Hernia   . History of kidney  cancer   . Hypercholesterolemia   . Hypertension   . RLS (restless legs syndrome)   . Sinus problem     Tobacco History: Social History   Tobacco Use  Smoking Status Former Smoker  . Packs/day: 2.00  . Years: 50.00  . Pack years: 100.00  . Types: Cigarettes  . Quit date: 06/04/2011  . Years since quitting: 8.8  Smokeless Tobacco Never Used   Counseling given: Yes   Continue to not smoke  Outpatient Encounter Medications as of 03/27/2020  Medication Sig  . allopurinol (ZYLOPRIM) 300 MG tablet Take 300 mg by mouth daily.  Marland Kitchen ALPRAZolam (XANAX) 0.5 MG tablet Take 1 tablet (0.5 mg total) by mouth 2 (two) times daily. (Patient taking differently: Take 0.5 mg by mouth at bedtime. )  . amLODipine (NORVASC) 10 MG tablet Take 10 mg by mouth daily.   Marland Kitchen atorvastatin (LIPITOR) 20 MG tablet Take 20 mg by mouth daily.  Marland Kitchen azelastine (ASTELIN) 0.1 % nasal spray Place 1 spray into both nostrils 2 (two) times daily. Once daily  . azithromycin (ZITHROMAX) 250 MG tablet Take Monday, Wednesday, Friday only with food.  . budesonide-formoterol (SYMBICORT) 160-4.5 MCG/ACT inhaler Inhale 2 puffs into the lungs 2 (two) times daily.  Marland Kitchen DALIRESP 500 MCG TABS tablet TAKE 1 TABLET EVERY DAY  . ELIQUIS 5 MG TABS tablet TAKE 1 TABLET TWO TIMES DAILY  . fish oil-omega-3 fatty acids 1000 MG capsule Take 1 capsule by mouth 3 (three) times daily.    . flecainide (TAMBOCOR) 100 MG tablet TAKE 1 (ONE) TABLET TWO TIMES DAILY  .  furosemide (LASIX) 40 MG tablet Take 1 tablet (40 mg total) by mouth 2 (two) times daily as needed for fluid. Am and 3 pm  . Guaifenesin (MUCINEX MAXIMUM STRENGTH) 1200 MG TB12 Take 1,200 mg by mouth in the morning and at bedtime.  . Linaclotide (LINZESS) 145 MCG CAPS capsule Take 145 mcg by mouth every other day.   . losartan-hydrochlorothiazide (HYZAAR) 100-25 MG tablet 1 tablet daily.   . metoprolol succinate (TOPROL-XL) 50 MG 24 hr tablet Take 50 mg by mouth daily. Take with or immediately following a meal.  . montelukast (SINGULAIR) 10 MG tablet TAKE 1 TABLET AT BEDTIME  . Multiple Vitamin (MULITIVITAMIN WITH MINERALS) TABS Take 1 tablet by mouth daily.  . nitroGLYCERIN (NITROSTAT) 0.4 MG SL tablet Place 0.4 mg under the tongue every 5 (five) minutes as needed for chest pain.  . potassium chloride (KLOR-CON) 10 MEQ tablet Take 1 tablet (10 mEq total) by mouth 2 (two) times daily as needed (With furosemide).  Marland Kitchen Respiratory Therapy Supplies (FLUTTER) DEVI Use as directed  . rOPINIRole (REQUIP) 0.5 MG tablet Take 1 tablet (0.5 mg total) by mouth daily.  . temazepam (RESTORIL) 15 MG capsule Take 15 mg by mouth at bedtime as needed for sleep.   . Tiotropium Bromide Monohydrate (SPIRIVA RESPIMAT) 2.5 MCG/ACT AERS Inhale 2 puffs into the lungs daily.  . traMADol (ULTRAM) 50 MG tablet TAKE 1 TABLET BY MOUTH THREE TIMES DAILY AS NEEDED FOR MODERATE PAIN  . vitamin C (ASCORBIC ACID) 500 MG tablet Take 500 mg by mouth daily.  Marland Kitchen albuterol (PROAIR HFA) 108 (90 Base) MCG/ACT inhaler INHALE 2 PUFFS BY MOUTH INTO THE LUNGS EVERY 4 HOURS AS NEEDED FOR WHEEZING OR SHORTNESS OF BREATH (Patient not taking: Reported on 03/27/2020)  . Budeson-Glycopyrrol-Formoterol (BREZTRI AEROSPHERE) 160-9-4.8 MCG/ACT AERO Inhale 2 puffs into the lungs in the morning and at bedtime. (Patient not taking: Reported  on 03/27/2020)  . nystatin (MYCOSTATIN) 100000 UNIT/ML suspension Take 5 mLs (500,000 Units total) by mouth 4 (four) times  daily.  . [DISCONTINUED] nystatin (MYCOSTATIN) 100000 UNIT/ML suspension Take 5 mLs (500,000 Units total) by mouth 4 (four) times daily. (Patient not taking: Reported on 03/27/2020)   No facility-administered encounter medications on file as of 03/27/2020.     Review of Systems  Review of Systems  Constitutional: Negative for activity change, chills, fatigue, fever and unexpected weight change.  HENT: Positive for congestion, postnasal drip and rhinorrhea. Negative for sinus pressure, sinus pain and sore throat.   Eyes: Negative.   Respiratory: Negative for cough, shortness of breath and wheezing.   Cardiovascular: Negative for chest pain and palpitations.  Gastrointestinal: Negative for constipation, diarrhea, nausea and vomiting.  Endocrine: Negative.   Genitourinary: Negative.   Musculoskeletal: Negative.   Skin: Negative.   Neurological: Negative for dizziness and headaches.  Psychiatric/Behavioral: Negative.  Negative for dysphoric mood. The patient is not nervous/anxious.   All other systems reviewed and are negative.    Physical Exam  BP 120/60 (BP Location: Left Arm, Cuff Size: Large)   Pulse 68   Temp 98.3 F (36.8 C) (Temporal)   Ht 6\' 2"  (1.88 m)   Wt (!) 304 lb 12.8 oz (138.3 kg)   SpO2 93%   BMI 39.13 kg/m   Wt Readings from Last 5 Encounters:  03/27/20 (!) 304 lb 12.8 oz (138.3 kg)  03/08/20 285 lb (129.3 kg)  01/03/20 295 lb 3.2 oz (133.9 kg)  09/08/19 295 lb 4.8 oz (133.9 kg)  08/02/19 300 lb (136.1 kg)   Discussed weight increase with patient  BMI Readings from Last 5 Encounters:  03/27/20 39.13 kg/m  03/08/20 36.59 kg/m  01/03/20 37.90 kg/m  09/08/19 37.91 kg/m  08/02/19 38.52 kg/m     Physical Exam Vitals and nursing note reviewed.  Constitutional:      General: He is not in acute distress.    Appearance: Normal appearance. He is obese.  HENT:     Head: Normocephalic and atraumatic.     Right Ear: Hearing, tympanic membrane, ear  canal and external ear normal.     Left Ear: Hearing, tympanic membrane, ear canal and external ear normal.     Nose: Nose normal. No mucosal edema or rhinorrhea.     Right Turbinates: Not enlarged.     Left Turbinates: Not enlarged.     Mouth/Throat:     Mouth: Mucous membranes are dry.     Pharynx: Oropharynx is clear. No oropharyngeal exudate.  Eyes:     Pupils: Pupils are equal, round, and reactive to light.  Cardiovascular:     Rate and Rhythm: Normal rate and regular rhythm.     Pulses: Normal pulses.     Heart sounds: Normal heart sounds. No murmur.  Pulmonary:     Effort: Pulmonary effort is normal.     Breath sounds: Normal breath sounds. No decreased breath sounds, wheezing or rales.  Abdominal:     General: Bowel sounds are normal. There is no distension.     Palpations: Abdomen is soft.     Tenderness: There is no abdominal tenderness.  Musculoskeletal:     Cervical back: Normal range of motion.     Right lower leg: No edema.     Left lower leg: No edema.  Lymphadenopathy:     Cervical: No cervical adenopathy.  Skin:    General: Skin is warm and dry.  Capillary Refill: Capillary refill takes less than 2 seconds.     Findings: No erythema or rash.  Neurological:     General: No focal deficit present.     Mental Status: He is alert and oriented to person, place, and time.     Motor: No weakness.     Coordination: Coordination normal.     Gait: Gait is intact. Gait normal.  Psychiatric:        Mood and Affect: Mood normal.        Behavior: Behavior normal. Behavior is cooperative.        Thought Content: Thought content normal.        Judgment: Judgment normal.       Assessment & Plan:   Medication management Plan: Trial of Breztri   Thrush, oral Plan: We will refill nystatin today  Chronic respiratory failure with hypoxia ([place]) Plan: Walk today in office Continue oxygen therapy as prescribed Maintain oxygen saturations greater than  90%   COPD (chronic obstructive pulmonary disease) with emphysema ([place]) Plan: Trial of Breztri Continue Monday Wednesday Friday azithromycin Continue Daliresp 500 mcg Referral to pulmonary rehab Follow-up with our office in 6 to 8 weeks     Return in about 3 months (around 06/27/2020), or if symptoms worsen or fail to improve, for Follow up with Dr. Valeta Harms.  Addendum: 03/28/2020 spoke with patient regarding referral for Zephyr procedure.  Gave patient option of referral to [facility] for Dr. Kerin Ransom E or referral to Dr. Lowry Ram at [facility] would like referral to Dr. Lowry Ram.   Lauraine Rinne, NP 03/27/2020   This appointment required 35 minutes of patient care (this includes precharting, chart review, review of results, face-to-face care, etc.).

## 2020-03-27 NOTE — Progress Notes (Signed)
PCCM: thanks for seeing him Garner Nash, DO [facility] 03/27/2020 6:11 PM

## 2020-03-27 NOTE — Patient Instructions (Addendum)
You were seen today by Lauraine Rinne, NP  for:   1. Centrilobular emphysema (HCC)  Breztri >>> 2 puffs in the morning right when you wake up, rinse out your mouth after use, 12 hours later 2 puffs, rinse after use >>> Take this daily, no matter what >>> This is not a rescue inhaler   Hold Symbicort and Spiriva when taking Breztri   Let our office know when you finish the first 2 samples, if you are tolerating this well and would like to remain on this we can place a prescription  We will refer you back to pulmonary rehab  Walk today in office  Note your daily symptoms > remember "red flags" for COPD:   >>>Increase in cough >>>increase in sputum production >>>increase in shortness of breath or activity  intolerance.   If you notice these symptoms, please call the office to be seen.    I will discuss with Dr. Valeta Harms whether or not he feels he would be an appropriate candidate for Zephyr procedure  2. Chronic respiratory failure with hypoxia (HCC)  Continue oxygen therapy as prescribed  >>>maintain oxygen saturations greater than 88 percent  >>>if unable to maintain oxygen saturations please contact the office  >>>do not smoke with oxygen  >>>can use nasal saline gel or nasal saline rinses to moisturize nose if oxygen causes dryness  Walk today in office  3. Medication management  Trial of Breztri >>> 2 puffs in the morning right when you wake up, rinse out your mouth after use, 12 hours later 2 puffs, rinse after use >>> Take this daily, no matter what >>> This is not a rescue inhaler    Follow Up:    Return in about 3 months (around 06/27/2020), or if symptoms worsen or fail to improve, for Follow up with Dr. Valeta Harms.   Please do your part to reduce the spread of COVID-19:      Reduce your risk of any infection  and COVID19 by using the similar precautions used for avoiding the common cold or flu:  Marland Kitchen Wash your hands often with soap and warm water for at least 20  seconds.  If soap and water are not readily available, use an alcohol-based hand sanitizer with at least 60% alcohol.  . If coughing or sneezing, cover your mouth and nose by coughing or sneezing into the elbow areas of your shirt or coat, into a tissue or into your sleeve (not your hands). Langley Gauss A MASK when in public  . Avoid shaking hands with others and consider head nods or verbal greetings only. . Avoid touching your eyes, nose, or mouth with unwashed hands.  . Avoid close contact with people who are sick. . Avoid places or events with large numbers of people in one location, like concerts or sporting events. . If you have some symptoms but not all symptoms, continue to monitor at home and seek medical attention if your symptoms worsen. . If you are having a medical emergency, call 911.   [place] / e-Visit: [email or web address]         [facility]: [facility]: [id number]                   [facility]: [id number]     It is flu season:   >>> Best ways to protect herself from the flu: Receive the yearly flu vaccine, practice good  hand hygiene washing with soap and also using hand sanitizer when available, eat a nutritious meals, get adequate rest, hydrate appropriately   Please contact the office if your symptoms worsen or you have concerns that you are not improving.   Thank you for choosing [facility] for your healthcare, and for allowing [place] to partner with you on your healthcare journey. I am thankful to be able to provide care to you today.   [name]-C    COPD and Physical Activity Chronic obstructive pulmonary disease (COPD) is a long-term (chronic) condition that affects the lungs. COPD is a general term that can be used to describe many different lung problems that cause lung swelling  (inflammation) and limit airflow, including chronic bronchitis and emphysema. The main symptom of COPD is shortness of breath, which makes it harder to do even simple tasks. This can also make it harder to exercise and be active. Talk with your health care provider about treatments to help you breathe better and actions you can take to prevent breathing problems during physical activity. What are the benefits of exercising with COPD? Exercising regularly is an important part of a healthy lifestyle. You can still exercise and do physical activities even though you have COPD. Exercise and physical activity improve your shortness of breath by increasing blood flow (circulation). This causes your heart to pump more oxygen through your body. Moderate exercise can improve your:  Oxygen use.  Energy level.  Shortness of breath.  Strength in your breathing muscles.  Heart health.  Sleep.  Self-esteem and feelings of self-worth.  Depression, stress, and anxiety levels. Exercise can benefit everyone with COPD. The severity of your disease may affect how hard you can exercise, especially at first, but everyone can benefit. Talk with your health care provider about how much exercise is safe for you, and which activities and exercises are safe for you. What actions can I take to prevent breathing problems during physical activity?  Sign up for a pulmonary rehabilitation program. This type of program may include: ? Education about lung diseases. ? Exercise classes that teach you how to exercise and be more active while improving your breathing. This usually involves:  Exercise using your lower extremities, such as a stationary bicycle.  About 30 minutes of exercise, 2 to 5 times per week, for 6 to 12 weeks  Strength training, such as push ups or leg lifts. ? Nutrition education. ? Group classes in which you can talk with others who also have COPD and learn ways to manage stress.  If you use an  oxygen tank, you should use it while you exercise. Work with your health care provider to adjust your oxygen for your physical activity. Your resting flow rate is different from your flow rate during physical activity.  While you are exercising: ? Take slow breaths. ? Pace yourself and do not try to go too fast. ? Purse your lips while breathing out. Pursing your lips is similar to a kissing or whistling position. ? If doing exercise that uses a quick burst of effort, such as weight lifting:  Breathe in before starting the exercise.  Breathe out during the hardest part of the exercise (such as raising the weights). Where to find support You can find support for exercising with COPD from:  Your health care provider.  A pulmonary rehabilitation program.  Your local health department or community health programs.  Support groups, online or in-person. Your health care provider may be  able to recommend support groups. Where to find more information You can find more information about exercising with COPD from:  American Lung Association: ClassInsider.se.  COPD Foundation: https://www.rivera.net/. Contact a health care provider if:  Your symptoms get worse.  You have chest pain.  You have nausea.  You have a fever.  You have trouble talking or catching your breath.  You want to start a new exercise program or a new activity. Summary  COPD is a general term that can be used to describe many different lung problems that cause lung swelling (inflammation) and limit airflow. This includes chronic bronchitis and emphysema.  Exercise and physical activity improve your shortness of breath by increasing blood flow (circulation). This causes your heart to provide more oxygen to your body.  Contact your health care provider before starting any exercise program or new activity. Ask your health care provider what exercises and activities are safe for you. This information is not intended to replace  advice given to you by your health care provider. Make sure you discuss any questions you have with your health care provider. Document Revised: 02/15/2019 Document Reviewed: 11/18/2017 Elsevier Patient Education  2020 [employer].

## 2020-03-27 NOTE — Assessment & Plan Note (Signed)
Plan: We will refill nystatin today

## 2020-03-27 NOTE — Assessment & Plan Note (Signed)
Plan: Trial of Breztri

## 2020-03-27 NOTE — Assessment & Plan Note (Signed)
Plan: Trial of Breztri Continue Monday Wednesday Friday azithromycin Continue Daliresp 500 mcg Referral to pulmonary rehab Follow-up with our office in 6 to 8 weeks

## 2020-03-27 NOTE — Progress Notes (Signed)
Patient identification verified. Results of recent CT scan reviewed.  Per [name], we can discuss patient CT chest at upcoming visit next week. Overall CT chest looks stable. Still showing known severe emphysema. Enlarged pulmonary arteries likely due to patient's known severe emphysema as well as chronic respiratory failure. No new medication changes or recommendations at this time.  Patient verbalized understanding of results and ongoing plan of care.

## 2020-03-27 NOTE — Assessment & Plan Note (Signed)
Plan: Walk today in office Continue oxygen therapy as prescribed Maintain oxygen saturations greater than 90%

## 2020-03-28 NOTE — Addendum Note (Signed)
Addended by: Lauraine Rinne on: 03/28/2020 08:50 AM   Modules accepted: Orders

## 2020-04-02 ENCOUNTER — Telehealth: Payer: Self-pay | Admitting: Pulmonary Disease

## 2020-04-02 ENCOUNTER — Telehealth (HOSPITAL_COMMUNITY): Payer: Self-pay | Admitting: *Deleted

## 2020-04-02 DIAGNOSIS — J432 Centrilobular emphysema: Secondary | ICD-10-CM

## 2020-04-02 NOTE — Telephone Encounter (Signed)
Sure that sounds like a fun plan.  Just put in order and I will cosign (don't know how to order that).

## 2020-04-02 NOTE — Telephone Encounter (Signed)
Dr. Tamala Julian looks like you are covering Dr. Fabio Bering box while he is out. Please advise on this message.  Rodney Langton [facility] [phone]  Evlyn Clines C 46 minutes ago (4:06 PM)  LR Lattie Haw states patient would benefit for the Pulmonary Undergrad Program. Needs order from Dr. Valeta Harms. Lattie Haw phone number is [phone].   Incoming call

## 2020-04-03 ENCOUNTER — Encounter (HOSPITAL_COMMUNITY): Payer: Self-pay | Admitting: *Deleted

## 2020-04-03 NOTE — Telephone Encounter (Signed)
ATC Lisa at pulmonary rehab. Left detailed message letting her know that order has been placed for pulmonary rehab. Nothing further needed at this time.

## 2020-04-03 NOTE — Progress Notes (Signed)
Received referral from Dr. Tamala Julian on behalf of Dr. Valeta Harms for this pt to participate in pulmonary rehab with the the diagnosis of Centrilobular Emphysema. This is an updated referral due to pulmonary rehab staff felt pt is more appropriate for pulmonary rehab verses Pulmonary rehab maintenance.  Pt is well known to pulmonary staff from his prior participation in both pulmonary undergrad and maintenance. Clinical review of pt follow up appt on 5/19 with Wyn Quaker NP with Dr. Valeta Harms Pulmonary office note.  Pt with Covid Risk Score - 7. Pt appropriate for scheduling for Pulmonary rehab.  Will forward to support staff for verification of insurance eligibility/benefits and pulmonary rehab staff for scheduling with pt consent. Cherre Huger, BSN Cardiac and Training and development officer

## 2020-04-09 ENCOUNTER — Telehealth (HOSPITAL_COMMUNITY): Payer: Self-pay

## 2020-04-09 NOTE — Telephone Encounter (Signed)
Pt insurance is active and benefits verified through Medicare a/b Co-pay 0, DED $203/$203 met, out of pocket 0/0 met, co-insurance 0. no pre-authorization require.  2ndary insurance is active and benefits verified through [employer]. Co-pay 0, DED 0/0 met, out of pocket 0/0 met, co-insurance 0. No pre-authorization required.

## 2020-04-10 ENCOUNTER — Other Ambulatory Visit: Payer: Self-pay | Admitting: Pulmonary Disease

## 2020-04-10 ENCOUNTER — Other Ambulatory Visit: Payer: Self-pay | Admitting: Cardiology

## 2020-04-10 DIAGNOSIS — I5032 Chronic diastolic (congestive) heart failure: Secondary | ICD-10-CM

## 2020-04-10 DIAGNOSIS — N1832 Chronic kidney disease, stage 3b: Secondary | ICD-10-CM

## 2020-04-10 DIAGNOSIS — I10 Essential (primary) hypertension: Secondary | ICD-10-CM

## 2020-04-11 ENCOUNTER — Telehealth (HOSPITAL_COMMUNITY): Payer: Self-pay | Admitting: *Deleted

## 2020-04-11 NOTE — Telephone Encounter (Signed)
Called Harold Hall to discuss referral to pulmonary rehab, he would like to participate, but cannot physically walk from the parking deck to our department.  I suggested his family drop him off at our entrance, but is not sure who would transport him.  Also, he is not sure if he can exercise with a mask, which is a requirement.  He will decide if he wants to participate given these hurdles and call us back.

## 2020-04-15 MED ORDER — BREZTRI AEROSPHERE 160-9-4.8 MCG/ACT IN AERO: 2.0000 | INHALATION_SPRAY | Freq: Two times a day (BID) | RESPIRATORY_TRACT | 2 refills | Status: AC

## 2020-04-15 NOTE — Telephone Encounter (Signed)
Patient liked medication RX sent per last OV AVS

## 2020-04-22 ENCOUNTER — Telehealth: Payer: Self-pay | Admitting: Pulmonary Disease

## 2020-04-22 NOTE — Telephone Encounter (Signed)
Patient contacted, qualifying walk charted with last visit. Information is available to the DME. Nothing further needed at this time.

## 2020-04-30 ENCOUNTER — Telehealth (HOSPITAL_COMMUNITY): Payer: Self-pay | Admitting: *Deleted

## 2020-05-23 ENCOUNTER — Telehealth: Payer: Self-pay | Admitting: Pulmonary Disease

## 2020-05-23 DIAGNOSIS — J432 Centrilobular emphysema: Secondary | ICD-10-CM

## 2020-05-23 NOTE — Telephone Encounter (Signed)
Spoke with pt's wife, [name]. She is aware of the response from [employer]. Melissa asked if an order can be placed for more E-tanks to be delivered. I have placed an order for this. Nothing further was needed.

## 2020-05-23 NOTE — Telephone Encounter (Signed)
Skeet Latch sent to Harland Dingwall, [name]; Verl Blalock; 1 other   Elige Radon,   I have reached out to the Physiological scientist and the regional manager to see what can be done to fix this problem so we can accommodate the patient now and in the future. I will let you know as soon as I have an answer and a solution.   Thanks

## 2020-05-23 NOTE — Telephone Encounter (Signed)
Message sent to [employer] to advise.

## 2020-05-23 NOTE — Telephone Encounter (Signed)
Called pt's spouse [name] who is either wanting to switch DMEs due to feeling like they are not getting anywhere with [employer] to be able to get tanks for pt.  Melissa stated pt was told that [employer] will not deliver tanks to their area. Melissa works 9-5 and pt is not ambulatory enough to be able to go out to get tanks. Pt uses 8L O2 when out of the house and tanks do not last that long being on that much O2.  Pt has 1/2 of an e-cylinder tank left at the house now. Melissa states that pt takes that as well as a couple refillable tanks with them when they go out of the house.  Melissa is asking for more tanks than 4 to be left due to the amount of O2 pt is needing to be on and also asking if [employer] can be able to come to their location to deliver all to pt.  PCCs, is there any way you can help [place] gather more info from [employer] to see if they can get this fixed for pt. If not, pt does want to switch DMEs. Pt stated that he has been with [employer] since 2012 so he has met the 6 year mark to be able to switch DMEs for O2 if that needs to be done.

## 2020-05-28 ENCOUNTER — Encounter: Payer: Self-pay | Admitting: Cardiology

## 2020-05-28 ENCOUNTER — Other Ambulatory Visit: Payer: Self-pay

## 2020-05-28 ENCOUNTER — Telehealth: Payer: Self-pay | Admitting: Pulmonary Disease

## 2020-05-28 ENCOUNTER — Ambulatory Visit: Payer: Medicare Other | Admitting: Cardiology

## 2020-05-28 VITALS — BP 139/60 | HR 70 | Resp 12 | Ht 74.0 in | Wt 307.6 lb

## 2020-05-28 DIAGNOSIS — Z7901 Long term (current) use of anticoagulants: Secondary | ICD-10-CM

## 2020-05-28 DIAGNOSIS — N1832 Chronic kidney disease, stage 3b: Secondary | ICD-10-CM

## 2020-05-28 DIAGNOSIS — I452 Bifascicular block: Secondary | ICD-10-CM | POA: Diagnosis not present

## 2020-05-28 DIAGNOSIS — Z905 Acquired absence of kidney: Secondary | ICD-10-CM | POA: Diagnosis not present

## 2020-05-28 DIAGNOSIS — J9611 Chronic respiratory failure with hypoxia: Secondary | ICD-10-CM | POA: Diagnosis not present

## 2020-05-28 DIAGNOSIS — R0609 Other forms of dyspnea: Secondary | ICD-10-CM | POA: Diagnosis not present

## 2020-05-28 DIAGNOSIS — I10 Essential (primary) hypertension: Secondary | ICD-10-CM

## 2020-05-28 DIAGNOSIS — R0902 Hypoxemia: Secondary | ICD-10-CM

## 2020-05-28 DIAGNOSIS — I480 Paroxysmal atrial fibrillation: Secondary | ICD-10-CM | POA: Diagnosis not present

## 2020-05-28 DIAGNOSIS — Z79899 Other long term (current) drug therapy: Secondary | ICD-10-CM

## 2020-05-28 DIAGNOSIS — R0602 Shortness of breath: Secondary | ICD-10-CM | POA: Diagnosis not present

## 2020-05-28 DIAGNOSIS — I2510 Atherosclerotic heart disease of native coronary artery without angina pectoris: Secondary | ICD-10-CM | POA: Diagnosis not present

## 2020-05-28 DIAGNOSIS — I5032 Chronic diastolic (congestive) heart failure: Secondary | ICD-10-CM

## 2020-05-28 DIAGNOSIS — Z87891 Personal history of nicotine dependence: Secondary | ICD-10-CM

## 2020-05-28 NOTE — Progress Notes (Signed)
Primary Physician/Referring:  Alroy Dust, L.[name], MD  Patient ID: Harold Hall, male    DOB: Mar 06, 1946, 74 y.o.   MRN: [id number]  Chief Complaint  Patient presents with  . Atrial Fibrillation  . Shortness of Breath  . Follow-up   HPI:    Harold Hall  is a 74 y.o. Caucasian male with HTN, COPD on home O2 (6L at rest and 8L with ambulation), and HLD, paroxysmal A. Fibrillation maintaing sinus rhythm since being on flecanide in June 2017. Single kidney S/P left nephrectomy for renal cell carcinoma. He follows Dr. Leroy Sea Icard from pulmonary standpoint and management of COPD, coronary angiography on 09/21/2018 had revealed no significant coronary disease.  He was rejected for transplant. CT scan of the chest and abdomen on 01/18/2020 had revealed extensive centrilobular emphysema and three-vessel coronary calcification.  Patient presents to the office accompanied by his wife Harold Hall at today's visit.  Patient is being seen today on a more urgent basis due to worsening shortness of breath requiring oxygen with ambulation.  Patient states that she has been having effort related dyspnea for the last 2 or 3 weeks but did not seek medical attention until this past Saturday when he started noticing that his oxygen saturation was dropping on 8 L of nasal cannula oxygen with ambulation.  Of note, patient has required more oxygen over the last several months and now he is currently using 6 L at rest and 8 L with ambulation.  However on Saturday patient states that after ambulating short distances oxygen dropped to 70% on 8 L and when he would rest for several minutes he would be back to 90%.  He states that he has gained approximately 5 to 6 pounds since the last office visit.  Cannot comment on orthopnea or paroxysmal nocturnal dyspnea sleeps in a recliner chronically.  No sick contacts.  Patient has received his COVID-19 vaccination.  Patient's wife states that he is compliant with his medical  therapy.   Past Medical History:  Diagnosis Date  . Allergic rhinitis   . Arthritis   . Cancer [facility])    left renal -solitary kidney  . COPD (chronic obstructive pulmonary disease) ([place])   . Erectile dysfunction   . Gout   . Hernia   . History of kidney cancer   . Hypercholesterolemia   . Hypertension   . RLS (restless legs syndrome)   . Sinus problem    Past Surgical History:  Procedure Laterality Date  . CARDIAC CATHETERIZATION    . HERNIA REPAIR  06/04/11  . left kidney removed  1994   Family History  Problem Relation Age of Onset  . COPD Mother   . Hypertension Mother   . Osteoporosis Mother   . Prostate cancer Father   . Cancer Father        prostate    Social History   Tobacco Use  . Smoking status: Former Smoker    Packs/day: 2.00    Years: 50.00    Pack years: 100.00    Types: Cigarettes    Quit date: 06/04/2011    Years since quitting: 8.9  . Smokeless tobacco: Never Used  Substance Use Topics  . Alcohol use: Yes    Alcohol/week: 2.0 - 3.0 standard drinks    Types: 2 - 3 Cans of beer per week    Comment: beers   Marital Status: Married  ROS  Review of Systems  Cardiovascular: Positive for claudication and leg swelling (mild). Negative for  palpitations.  Respiratory: Positive for shortness of breath. Negative for snoring and wheezing.   Hematologic/Lymphatic: Does not bruise/bleed easily.  Musculoskeletal: Positive for arthritis and back pain.  Psychiatric/Behavioral: Negative for depression.   Objective  Blood pressure 139/60, pulse 70, resp. rate 12, height 6\' 2"  (1.88 m), weight (!) 307 lb 9.6 oz (139.5 kg), SpO2 94 %.  Vitals with BMI 05/28/2020 03/27/2020 03/08/2020  Height 6\' 2"  6\' 2"  6\' 2"   Weight 307 lbs 10 oz 304 lbs 13 oz 285 lbs  BMI 39.48 62.83 15.17  Systolic 616 073 710  Diastolic 60 60 63  Pulse 70 68 74     Physical Exam Constitutional:      Appearance: He is well-developed.     Comments: Morbidly obese. On nasal cannula O2.    HENT:     Head: Atraumatic.  Eyes:     Conjunctiva/sclera: Conjunctivae normal.  Neck:     Thyroid: No thyromegaly.     Vascular: No JVD.     Comments: Short neck and difficult to evaluate JVP Cardiovascular:     Rate and Rhythm: Normal rate and regular rhythm.     Pulses:          Carotid pulses are 2+ on the right side and 2+ on the left side.      Dorsalis pedis pulses are 2+ on the right side and 2+ on the left side.       Posterior tibial pulses are 2+ on the right side and 0 on the left side.     Heart sounds: No murmur heard.  No gallop.      Comments: popliteal pulse difficult to feel due to patient's body habitus.   Trace leg edema. No JVD.   Pulmonary:     Effort: Pulmonary effort is normal. No accessory muscle usage or respiratory distress.     Breath sounds: Decreased breath sounds present. No wheezing, rhonchi or rales.  Abdominal:     General: Bowel sounds are normal.     Palpations: Abdomen is soft.  Musculoskeletal:        General: Normal range of motion.     Cervical back: Neck supple.  Skin:    General: Skin is warm and dry.  Neurological:     Mental Status: He is alert and oriented to person, place, and time.  Psychiatric:        Mood and Affect: Mood normal.        Behavior: Behavior normal.    Laboratory examination:   Recent Labs    06/13/19 1230 08/07/19 1023  NA 141 146*  K 3.9 4.2  CL 98 100  CO2 39* 32*  GLUCOSE 92 106*  BUN 21 27  CREATININE 1.31 1.71*  CALCIUM 10.0 10.1  GFRNONAA  --  39*  GFRAA  --  45*   CrCl cannot be calculated (Patient's most recent lab result is older than the maximum 21 days allowed.).  CMP Latest Ref Rng & Units 08/07/2019 06/13/2019 06/10/2011  Glucose 65 - 99 mg/dL 106(H) 92 118(H)  BUN 8 - 27 mg/dL 27 21 22   Creatinine 0.76 - 1.27 mg/dL 1.71(H) 1.31 1.34  Sodium 134 - 144 mmol/L 146(H) 141 143  Potassium 3.5 - 5.2 mmol/L 4.2 3.9 4.1  Chloride 96 - 106 mmol/L 100 98 101  CO2 20 - 29 mmol/L 32(H) 39(H) 31   Calcium 8.6 - 10.2 mg/dL 10.1 10.0 9.3  Total Protein 6.0 - 8.3 g/dL - 7.6 -  Total Bilirubin 0.2 - 1.2 mg/dL - 0.8 -  Alkaline Phos 39 - 117 U/L - 100 -  AST 0 - 37 U/L - 22 -  ALT 0 - 53 U/L - 26 -   CBC Latest Ref Rng & Units 06/13/2019 10/06/2016 06/10/2011  WBC 4.0 - 10.5 K/uL 9.1 8.4 7.9  Hemoglobin 13.0 - 17.0 g/dL 15.5 16.8 11.2(L)  Hematocrit 39 - 52 % 46.0 49.6 33.7(L)  Platelets 150 - 400 K/uL 187.0 190.0 219   Lipid Panel  No results found for: CHOL, TRIG, HDL, CHOLHDL, VLDL, LDLCALC, LDLDIRECT HEMOGLOBIN A1C No results found for: HGBA1C, MPG TSH No results for input(s): TSH in the last 8760 hours.  External labs:  09/21/2018:  Total cholesterol 140, triglycerides 134, HDL 40, LDL 68.1. 06/13/2019: NT-Pro BNP 71   Medications and allergies  No Known Allergies   Current Outpatient Medications  Medication Instructions  . albuterol (PROAIR HFA) 108 (90 Base) MCG/ACT inhaler INHALE 2 PUFFS BY MOUTH INTO THE LUNGS EVERY 4 HOURS AS NEEDED FOR WHEEZING OR SHORTNESS OF BREATH  . allopurinol (ZYLOPRIM) 300 mg, Daily  . ALPRAZolam (XANAX) 0.5 mg, Oral, 2 times daily  . amLODipine (NORVASC) 10 mg, Daily  . amoxicillin (AMOXIL) 500 mg, Oral, 3 times daily  . atorvastatin (LIPITOR) 20 mg, Oral, Daily  . azelastine (ASTELIN) 0.1 % nasal spray 1 spray, Each Nare, 2 times daily, Once daily  . azithromycin (ZITHROMAX) 250 MG tablet Take Monday, Wednesday, Friday only with food.  . Budeson-Glycopyrrol-Formoterol (BREZTRI AEROSPHERE) 160-9-4.8 MCG/ACT AERO 2 puffs, Inhalation, 2 times daily  . budesonide-formoterol (SYMBICORT) 160-4.5 MCG/ACT inhaler 2 puffs, Inhalation, 2 times daily  . DALIRESP 500 MCG TABS tablet TAKE 1 TABLET EVERY DAY  . ELIQUIS 5 MG TABS tablet TAKE 1 TABLET TWO TIMES DAILY  . fish oil-omega-3 fatty acids 1000 MG capsule 1 capsule, 3 times daily  . flecainide (TAMBOCOR) 100 MG tablet TAKE 1 (ONE) TABLET TWO TIMES DAILY  . furosemide (LASIX) 40 MG tablet TAKE 1  TABLET TWICE DAILY IN THE MORNING AND AT 3 PM AS NEEDED FOR FLUID  . linaclotide (LINZESS) 145 mcg, Oral, Every other day  . losartan-hydrochlorothiazide (HYZAAR) 100-25 MG tablet 1 tablet, Daily  . metoprolol succinate (TOPROL-XL) 50 mg, Daily  . montelukast (SINGULAIR) 10 MG tablet TAKE 1 TABLET AT BEDTIME  . Multiple Vitamin (MULITIVITAMIN WITH MINERALS) TABS 1 tablet, Daily  . nitroGLYCERIN (NITROSTAT) 0.4 mg, Sublingual, Every 5 min PRN  . nystatin (MYCOSTATIN) 500,000 Units, Oral, 4 times daily  . OXYGEN 6 L, Inhalation, 8L if exerting   . potassium chloride (KLOR-CON) 10 MEQ tablet 10 mEq, Oral, 2 times daily PRN  . Respiratory Therapy Supplies (FLUTTER) DEVI Use as directed  . rOPINIRole (REQUIP) 0.5 mg, Oral, Daily  . temazepam (RESTORIL) 15 mg, At bedtime PRN  . Tiotropium Bromide Monohydrate (SPIRIVA RESPIMAT) 2.5 MCG/ACT AERS 2 puffs, Inhalation, Daily  . traMADol (ULTRAM) 50 MG tablet TAKE 1 TABLET BY MOUTH THREE TIMES DAILY AS NEEDED FOR MODERATE PAIN  . vitamin C (ASCORBIC ACID) 500 mg, Oral, Daily   Radiology:   CT angio 05/22/2011: Mediastinal lymph nodes are not enlarged by CT size criteria. No hilar or axillary adenopathy. Atherosclerotic calcification of the arterial vasculature, including coronary arteries. Heart size normal. Anterior pericardial fluid or thickening. Pulmonary arteries are enlarged. Biapical pleural parenchymal scarring, left greater than right. Emphysema. Focal mucoid impaction in the anterior segment right upper lobe (image 25). Lungs are otherwise clear. No pleural  fluid. Scattered debris in the trachea.   CT Chest Wo Contrast 01/18/2020: - Advanced bullous type emphysema with biapical pleuroparenchymal scarring. Emphysema. - Pulmonary artery enlargement suggests pulmonary arterial hypertension. - Coronary artery atherosclerosis. Aortic Atherosclerosis.   Cardiac Studies:   Chest X-Ray 04/21/2016: 1. Stable cardiomegaly.  No pulmonary venous  congestion. 2. Stable chronic interstitial changes consistent with interstitial fibrosis  Echocardiogram 05/07/2016: Left ventricle cavity is normal in size. Moderate concentric hypertrophy of the left ventricle. Hyperdynamic LV systolic function with cavitary obliteration. Suspect intraventricular pressure gradient. Doppler evidence of grade I (impaired) diastolic dysfunction. Visual EF is 65-70%. Left atrial cavity is severely dilated at 5.5cm. Moderate tricuspid regurgitation. Moderate pulmonary hypertension. Pulmonary artery systolic pressure is estimated at 32mm Hg. Insignificant pericardial effusion. IVC is dilated with respiratory variation. This suggests elevated right heart pressure. Compared to the study done on 02/17/2014, LA size was mildly dilated and Pul HTN new.  Lexiscan myoview stress test 05/01/2016: 1. The resting electrocardiogram demonstrated atrial fibrillation, normal resting conduction and nonspecific ST-T changes. Stress EKG is non-diagnostic for ischemia as it a pharmacologic stress using Lexiscan. Stress symptoms included dyspnea. 2. The perfusion imaging study demonstrates mild diaphragmatic attenuation artifact in the inferior wall, there is no evidence of ischemia or scar. Left ventricle systolic function calculated by QGS was 86%. This is a low risk study. No significant change from 08/31/2014.  Carotid artery duplex 02/01/2018: No hemodynamically significant arterial disease in the internal carotid artery bilaterally. Right vertebral artery flow is not visualized. Left vertebral artery flow is not visualized. Compared to study done on 09/26/2014, left ICA stenosis of less than 50% not present. Previously vertebral arteries were well visualized.  Sleep Study [facility] 10/02/2018: Sleep Apnea mild. 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 Moderate  pulmonary hypertension, mean pressure 32 mmHg.  Event Monitor 2 weeks 09/04/19 -09/10/2019: No symptoms reported. NSR.   Echocardiogram 08/07/2019: Inadequate visualization due to suboptimal acoustic windows.  Left ventricle cavity is normal in size. Moderate concentric hypertrophy of the left ventricle. Normal LV systolic function with visual EF 55-60%. Normal global wall motion. Doppler evidence of grade I (impaired) diastolic dysfunction, normal LAP.  Mild (Grade I) mitral regurgitation. Inadequate TR jet to estimate pulmonary artery systolic pressure.  IVC is dilated with respiratory variation. Estimated RA pressure 8 mmHg Left atrial dilatation and pulmonary hypertension noted in 2017 not well appreciated on this study. This could be attributed to quality of the study. Consider alternate testing modality, if clinical suspicion is high.  EKG 05/28/2020: Normal sinus rhythm, 70 bpm, first-degree AV block left axis deviation, poor R wave progression, consider old anteroseptal infarct, without underlying injury pattern.  Similar findings on prior EKG.   Assessment     ICD-10-CM   1. Dyspnea on exertion  R06.00 EKG 12-Lead    Pro b natriuretic peptide (BNP)    Basic metabolic panel    Magnesium  2. Hypoxia Acute R09.02   3. Chronic diastolic (congestive) heart failure (HCC)  I50.32 Pro b natriuretic peptide (BNP)    Basic metabolic panel    Magnesium  4. Chronic respiratory failure with hypoxia (HCC)  J96.11   5. Essential hypertension  I10   6. Bifascicular block  I45.2   7. Single kidney  Z90.5   8. Stage 3b chronic kidney disease  N18.32   9. Coronary artery calcification seen on CAT scan 01/28/20  I25.10   10. Paroxysmal atrial fibrillation (HCC)  I48.0 EKG 12-Lead  11. Long term (current) use of anticoagulants  Z79.01   12. Long term current use of antiarrhythmic drug  Z79.899   13. Former smoker  Z87.891      Recommendations:   ERUBIEL MANASCO  is a 74 y.o.  Caucasian  male with HTN, COPD on home O2 (6L at rest and 8L with ambulation), and HLD, paroxysmal A. Fibrillation maintaing sinus rhythm since being on flecanide in June 2017. Single kidney S/P left nephrectomy for renal cell carcinoma. He follows Dr. Leroy Sea Icard from pulmonary standpoint and management of COPD, coronary angiography on 09/21/2018 had revealed no significant coronary disease.  He was rejected for transplant.  CT scan of the chest and abdomen on 01/18/2020 had revealed extensive centrilobular emphysema and three-vessel coronary calcification.  Patient presents to the office to be seen more urgently given his dyspnea on exertion.  Clinically patient does not appear to be in fluid overload but does carry a prior history of chronic diastolic heart failure.  Recommend checking a BMP, BMP and magnesium level.  Continue Lasix 40 mg p.o. twice daily.  Will not uptitrate diuretic therapy as patient has history of nephrectomy and appears fairly euvolemic on physical examination.  In addition, EKG showed normal sinus rhythm with a controlled ventricular rate.  Patient is also compliant with oral anticoagulation given his underlying paroxysmal atrial fibrillation.  His symptoms of hypoxia are less likely to be from a cardiac etiology and more likely from his underlying extensive/progressive emphysema due to 80-year pack history of smoking.  I have asked him to also make a follow-up appointment with his pulmonologist to be reevaluated from pulmonary standpoint as this may be further progression of his underlying emphysema.  Shared decision was to proceed with lab work today.  And if his BNP is elevated will uptitrate diuretic therapy and have her reevaluated in the next 4 weeks by his primary cardiologist Dr. Einar Gip.  He is also asked to keep a log of his weight on a daily basis and decrease salt intake to less than 1500 mg/day.  Of note, patient also also been started on statin therapy given his underlying coronary  calcification noted on nongated CT study in the past.  The questions and concerns were addressed to their satisfaction patient will be seen in close follow-up.  Total time spent: 40 minutes.  Orders Placed This Encounter  Procedures  . Pro b natriuretic peptide (BNP)  . Basic metabolic panel  . Magnesium  . EKG 12-Lead   Mechele Claude [facility]  Pager: [phone] Office: [phone]

## 2020-05-28 NOTE — Telephone Encounter (Signed)
I have spoken with Caryl Pina from [place]. He was at the cardiology office the other day which the patient sees and wanted to see if [employer] could help the patient. Patient was complaining about having to go to [employer] to pick up the Etanks. It looks like to me that Harold Hall called in on 7/15 asking to change DME companies and we just put in a new order for more Etanks.   Caryl Pina with [employer] states that NIV might help the patient. Caryl Pina will be talking with Estill Bamberg with [employer] to see what they can do to help the patient.  The inhaler that Aaron Edelman wanted the patient to use Judithann Sauger has not been filled. Patient stated it cost too much.  Patient also stated that he has his other inhalers at home to use. [facility] can also do the neb meds along with 02. I told Caryl Pina that the patient would still need to ask to change DME companies which it actually looks like they have already

## 2020-05-29 NOTE — Telephone Encounter (Signed)
Here is Ashly from [employer]'s response  Ernest Mallick Phone Number: [phone]  Good Morning,  [facility] can provide NIV, O2, and Nebulized medicines for MR. Zukowski. Need televisit/office visit discussing all . Will drop-off NIV order form with Aaron Edelman or you. Meds need to be placed through "[facility]" as   a. Performist/bid/ 120 ml, 11 refills  b. budesonide/ bid/ 120 ml / 11 refills  c. Yupelri / QD / 38ml / 11 refills   Thanks For your help

## 2020-05-30 ENCOUNTER — Encounter (HOSPITAL_COMMUNITY): Payer: Self-pay

## 2020-05-30 ENCOUNTER — Emergency Department (HOSPITAL_COMMUNITY): Payer: Medicare Other

## 2020-05-30 ENCOUNTER — Telehealth: Payer: Self-pay | Admitting: Pulmonary Disease

## 2020-05-30 ENCOUNTER — Emergency Department (HOSPITAL_COMMUNITY)
Admission: EM | Admit: 2020-05-30 | Discharge: 2020-05-30 | Disposition: A | Discharge status: Home / Self Care | Payer: Medicare Other | Attending: Emergency Medicine | Admitting: Emergency Medicine

## 2020-05-30 DIAGNOSIS — I110 Hypertensive heart disease with heart failure: Secondary | ICD-10-CM | POA: Insufficient documentation

## 2020-05-30 DIAGNOSIS — R600 Localized edema: Secondary | ICD-10-CM | POA: Diagnosis not present

## 2020-05-30 DIAGNOSIS — Z7951 Long term (current) use of inhaled steroids: Secondary | ICD-10-CM | POA: Insufficient documentation

## 2020-05-30 DIAGNOSIS — R0602 Shortness of breath: Secondary | ICD-10-CM | POA: Diagnosis present

## 2020-05-30 DIAGNOSIS — Z85528 Personal history of other malignant neoplasm of kidney: Secondary | ICD-10-CM | POA: Diagnosis not present

## 2020-05-30 DIAGNOSIS — R0600 Dyspnea, unspecified: Secondary | ICD-10-CM | POA: Diagnosis not present

## 2020-05-30 DIAGNOSIS — R0902 Hypoxemia: Secondary | ICD-10-CM | POA: Diagnosis not present

## 2020-05-30 DIAGNOSIS — Z87891 Personal history of nicotine dependence: Secondary | ICD-10-CM | POA: Diagnosis not present

## 2020-05-30 DIAGNOSIS — J439 Emphysema, unspecified: Secondary | ICD-10-CM | POA: Diagnosis not present

## 2020-05-30 DIAGNOSIS — Z79899 Other long term (current) drug therapy: Secondary | ICD-10-CM | POA: Insufficient documentation

## 2020-05-30 DIAGNOSIS — I5030 Unspecified diastolic (congestive) heart failure: Secondary | ICD-10-CM | POA: Insufficient documentation

## 2020-05-30 DIAGNOSIS — J441 Chronic obstructive pulmonary disease with (acute) exacerbation: Secondary | ICD-10-CM | POA: Insufficient documentation

## 2020-05-30 DIAGNOSIS — Z7901 Long term (current) use of anticoagulants: Secondary | ICD-10-CM | POA: Insufficient documentation

## 2020-05-30 LAB — I-STAT ARTERIAL BLOOD GAS, ED
Acid-Base Excess: 13 mmol/L — ABNORMAL HIGH (ref 0.0–2.0)
Bicarbonate: 40.4 mmol/L — ABNORMAL HIGH (ref 20.0–28.0)
Calcium, Ion: 1.16 mmol/L (ref 1.15–1.40)
HCT: 41 % (ref 39.0–52.0)
Hemoglobin: 13.9 g/dL (ref 13.0–17.0)
O2 Saturation: 83 %
Patient temperature: 98.2
Potassium: 3.3 mmol/L — ABNORMAL LOW (ref 3.5–5.1)
Sodium: 140 mmol/L (ref 135–145)
TCO2: 42 mmol/L — ABNORMAL HIGH (ref 22–32)
pCO2 arterial: 60.7 mmHg — ABNORMAL HIGH (ref 32.0–48.0)
pH, Arterial: 7.43 (ref 7.350–7.450)
pO2, Arterial: 48 mmHg — ABNORMAL LOW (ref 83.0–108.0)

## 2020-05-30 LAB — CBC
HCT: 41.7 % (ref 39.0–52.0)
Hemoglobin: 12.4 g/dL — ABNORMAL LOW (ref 13.0–17.0)
MCH: 25.5 pg — ABNORMAL LOW (ref 26.0–34.0)
MCHC: 29.7 g/dL — ABNORMAL LOW (ref 30.0–36.0)
MCV: 85.8 fL (ref 80.0–100.0)
Platelets: 205 10*3/uL (ref 150–400)
RBC: 4.86 MIL/uL (ref 4.22–5.81)
RDW: 16.1 % — ABNORMAL HIGH (ref 11.5–15.5)
WBC: 8.3 10*3/uL (ref 4.0–10.5)
nRBC: 0 % (ref 0.0–0.2)

## 2020-05-30 LAB — BASIC METABOLIC PANEL
Anion gap: 11 (ref 5–15)
BUN: 31 mg/dL — ABNORMAL HIGH (ref 8–23)
CO2: 38 mmol/L — ABNORMAL HIGH (ref 22–32)
Calcium: 9.2 mg/dL (ref 8.9–10.3)
Chloride: 91 mmol/L — ABNORMAL LOW (ref 98–111)
Creatinine, Ser: 1.91 mg/dL — ABNORMAL HIGH (ref 0.61–1.24)
GFR calc Af Amer: 39 mL/min — ABNORMAL LOW (ref >60)
GFR calc non Af Amer: 34 mL/min — ABNORMAL LOW (ref >60)
Glucose, Bld: 118 mg/dL — ABNORMAL HIGH (ref 70–99)
Potassium: 3.8 mmol/L (ref 3.5–5.1)
Sodium: 140 mmol/L (ref 135–145)

## 2020-05-30 LAB — TROPONIN I (HIGH SENSITIVITY): Troponin I (High Sensitivity): 8 ng/L (ref <18)

## 2020-05-30 MED ORDER — ALBUTEROL SULFATE (2.5 MG/3ML) 0.083% IN NEBU
5.0000 mg | INHALATION_SOLUTION | Freq: Once | RESPIRATORY_TRACT | Status: AC
  Administered 2020-05-30: 5 mg via RESPIRATORY_TRACT
  Filled 2020-05-30: qty 6

## 2020-05-30 MED ORDER — SODIUM CHLORIDE 0.9% FLUSH: 3.0000 mL | Freq: Once | INTRAVENOUS | Status: DC

## 2020-05-30 MED ORDER — PREDNISONE 20 MG PO TABS
40.0000 mg | ORAL_TABLET | Freq: Once | ORAL | Status: AC
  Administered 2020-05-30: 40 mg via ORAL
  Filled 2020-05-30: qty 2

## 2020-05-30 MED ORDER — PREDNISONE 20 MG PO TABS
40.0000 mg | ORAL_TABLET | Freq: Every day | ORAL | 0 refills | Status: DC
Start: 2020-05-30 — End: 2020-07-03

## 2020-05-30 NOTE — Telephone Encounter (Signed)
Harold Hall, please advise.

## 2020-05-30 NOTE — Telephone Encounter (Signed)
I called and notified Caryl Pina of Brian's response. He wanted to apologize and let Aaron Edelman know that he was not trying to come across this way, only trying to help be an advocate for the pt. He states that he knows that the providers do not always know what devices and meds are covered and he tries to help in letting the provider know in advance in case this is helpful. Will forward to [place] as FYI. Pt has appt here 06/06/20 and will discuss tx then.

## 2020-05-30 NOTE — Discharge Instructions (Signed)
Please call your pulmonary doctors for follow up this week You will need to see the kidney doctors due tot he slight worsening of your kidney function - call today for next available appointment  Prednisone daily for 5 days No signs of pneumonia Take albuterol 2 puffs every 4 hours for 24 hours, then every 4 hours as needed  ER for severe or worsening symptoms

## 2020-05-30 NOTE — ED Provider Notes (Signed)
[facility] EMERGENCY DEPARTMENT Provider Note   CSN: [id number] Arrival date & time: 05/30/20  1919     History Chief Complaint  Patient presents with  . Shortness of Breath    Harold Hall is a 74 y.o. male.  HPI   This patient is a 74 year old male, he has a known history of renal cell carcinoma with one residual kidney, he has COPD which has become quite severe and has left him with high flow nasal cannula at home, baseline is now 6 to 8 L by nasal cannula.  He reports that he had had some increasing sleepiness last week, the seem to get better, he is having some ongoing shortness of breath which he is concerned about and is measuring oxygen level somewhere between 75 and 85% at times though sometimes it is back up to normal.  He has not had prednisone in several months according to his report, he was recently on antibiotics for a dental extraction which was done a week ago and he took his last dose today.  Because of his increasing shortness of breath he had actually been seen in the cardiology office.  It was noted that he has paroxysmal atrial fibrillation though he has been on sinus rhythm since starting flecainide in 2017.  The patient has not a candidate for a transplant for his kidney, he does have some coronary disease, he has centrilobular emphysema.  He had reported to the cardiology service that he was having these oxygenation issues.  The patient is fully vaccinated for Covid.  He has not had any fevers, has chronic mild swelling of his legs which has not been any changes recently  Last coronary angiogram was November 2019 showing nonobstructive coronary disease, echocardiogram from September 2020 showed ejection fraction 55 to 60% with grade 1 diastolic dysfunction.  The patient is currently on Lasix 40 mg twice a day, the decision by cardiology to not increase the diuretic therapy seems reasonable since the patient has not had increasing fluid  The  patiently currently takes Spiriva and Symbicort daily, last seen by pulmonology on 2020-03-27 approximately 2 months ago.  At that time he was increased to 6 L at rest and 8 L on exertion.  Past Medical History:  Diagnosis Date  . Allergic rhinitis   . Arthritis   . Cancer [facility])    left renal -solitary kidney  . COPD (chronic obstructive pulmonary disease) ([place])   . Erectile dysfunction   . Gout   . Hernia   . History of kidney cancer   . Hypercholesterolemia   . Hypertension   . RLS (restless legs syndrome)   . Sinus problem     Patient Active Problem List   Diagnosis Date Noted  . Diastolic CHF ([place]) 03/47/4259  . Thrush, oral 06/13/2019  . Medication management 06/13/2019  . Therapeutic drug monitoring 06/13/2019  . Edema 04/26/2018  . Atrial fibrillation ([place]) 04/21/2016  . Dyspnea 04/21/2016  . Chronic respiratory failure with hypoxia ([place]) 10/24/2015  . COPD exacerbation ([place]) 10/22/2011  . Pulmonary nodule 05/19/2011  . Abdominal pain, left upper quadrant at site of previous nephrectomy 1994 05/06/2011  . Umbilical hernia 56/38/7564  . Inguinal hernia bilateral, non-recurrent 05/06/2011  . RESTLESS LEGS SYNDROME 10/06/2010  . COPD (chronic obstructive pulmonary disease) with emphysema ([place]) 08/12/2007    Past Surgical History:  Procedure Laterality Date  . CARDIAC CATHETERIZATION    . HERNIA REPAIR  06/04/11  . left kidney removed  1994  Family History  Problem Relation Age of Onset  . COPD Mother   . Hypertension Mother   . Osteoporosis Mother   . Prostate cancer Father   . Cancer Father        prostate    Social History   Tobacco Use  . Smoking status: Former Smoker    Packs/day: 2.00    Years: 50.00    Pack years: 100.00    Types: Cigarettes    Quit date: 06/04/2011    Years since quitting: 8.9  . Smokeless tobacco: Never Used  Vaping Use  . Vaping Use: Never used  Substance Use Topics  . Alcohol use: Yes    Alcohol/week: 2.0 - 3.0  standard drinks    Types: 2 - 3 Cans of beer per week    Comment: beers  . Drug use: No    Home Medications Prior to Admission medications   Medication Sig Start Date End Date Taking? Authorizing Provider  albuterol (PROAIR HFA) 108 (90 Base) MCG/ACT inhaler INHALE 2 PUFFS BY MOUTH INTO THE LUNGS EVERY 4 HOURS AS NEEDED FOR WHEEZING OR SHORTNESS OF BREATH 12/14/18   Juanito Doom, MD  allopurinol (ZYLOPRIM) 300 MG tablet Take 300 mg by mouth daily.    [provider]  ALPRAZolam Duanne Moron) 0.5 MG tablet Take 1 tablet (0.5 mg total) by mouth 2 (two) times daily. Patient taking differently: Take 0.5 mg by mouth at bedtime.  08/24/14   Kathee Delton, MD  amLODipine (NORVASC) 10 MG tablet Take 10 mg by mouth daily.     [provider]  amoxicillin (AMOXIL) 500 MG capsule Take 500 mg by mouth in the morning, at noon, and at bedtime. 05/22/20   [provider]  atorvastatin (LIPITOR) 20 MG tablet Take 20 mg by mouth daily.    [provider]  azelastine (ASTELIN) 0.1 % nasal spray Place 1 spray into both nostrils 2 (two) times daily. Once daily 03/20/15   [provider]  azithromycin (ZITHROMAX) 250 MG tablet Take Monday, Wednesday, Friday only with food. Patient not taking: Reported on 05/28/2020 11/29/19   Lauraine Rinne, NP  Budeson-Glycopyrrol-Formoterol (BREZTRI AEROSPHERE) 160-9-4.8 MCG/ACT AERO Inhale 2 puffs into the lungs in the morning and at bedtime. Patient not taking: Reported on 05/28/2020 04/15/20   Lauraine Rinne, NP  budesonide-formoterol [facility]) 160-4.5 MCG/ACT inhaler Inhale 2 puffs into the lungs 2 (two) times daily. 11/14/19   Lauraine Rinne, NP  DALIRESP 500 MCG TABS tablet TAKE 1 TABLET EVERY DAY 10/20/19   Martyn Ehrich, NP  ELIQUIS 5 MG TABS tablet TAKE 1 TABLET TWO TIMES DAILY 03/05/20   Adrian Prows, MD  fish oil-omega-3 fatty acids 1000 MG capsule Take 1 capsule by mouth 3 (three) times daily.      [provider]    flecainide (TAMBOCOR) 100 MG tablet TAKE 1 (ONE) TABLET TWO TIMES DAILY 03/05/20   Adrian Prows, MD  furosemide (LASIX) 40 MG tablet TAKE 1 TABLET TWICE DAILY IN THE MORNING AND AT 3 PM AS NEEDED FOR FLUID 04/11/20   Adrian Prows, MD  Linaclotide [facility]) 145 MCG CAPS capsule Take 145 mcg by mouth every other day.     [provider]  losartan-hydrochlorothiazide (HYZAAR) 100-25 MG tablet 1 tablet daily.  05/16/18   [provider]  metoprolol succinate (TOPROL-XL) 50 MG 24 hr tablet Take 50 mg by mouth daily. Take with or immediately following a meal.    [provider]  montelukast (SINGULAIR) 10 MG tablet TAKE 1 TABLET AT BEDTIME 04/11/20   Lauraine Rinne, NP  Multiple Vitamin (MULITIVITAMIN WITH MINERALS) TABS Take 1 tablet by mouth daily.    [provider]  nitroGLYCERIN (NITROSTAT) 0.4 MG SL tablet Place 0.4 mg under the tongue every 5 (five) minutes as needed for chest pain.    [provider]  nystatin (MYCOSTATIN) 100000 UNIT/ML suspension Take 5 mLs (500,000 Units total) by mouth 4 (four) times daily. 03/27/20   Lauraine Rinne, NP  OXYGEN Inhale 6 L into the lungs. 8L if exerting    [provider]  potassium chloride (KLOR-CON) 10 MEQ tablet Take 1 tablet (10 mEq total) by mouth 2 (two) times daily as needed (With furosemide). 09/08/19 09/02/20  Adrian Prows, MD  predniSONE (DELTASONE) 20 MG tablet Take 2 tablets (40 mg total) by mouth daily. 05/30/20   Noemi Chapel, MD  Respiratory Therapy Supplies (FLUTTER) DEVI Use as directed 05/10/18   Lauraine Rinne, NP  rOPINIRole (REQUIP) 0.5 MG tablet Take 1 tablet (0.5 mg total) by mouth daily. 04/05/13   Clance, Armando Reichert, MD  temazepam (RESTORIL) 15 MG capsule Take 15 mg by mouth at bedtime as needed for sleep.     [provider]  Tiotropium Bromide Monohydrate (SPIRIVA RESPIMAT) 2.5 MCG/ACT AERS Inhale 2 puffs into the lungs daily. 12/15/19   Lauraine Rinne, NP  traMADol (ULTRAM) 50 MG tablet TAKE 1  TABLET BY MOUTH THREE TIMES DAILY AS NEEDED FOR MODERATE PAIN 01/26/20   Adrian Prows, MD  vitamin C (ASCORBIC ACID) 500 MG tablet Take 500 mg by mouth daily.    [provider]    Allergies    Patient has no known allergies.  Review of Systems   Review of Systems  All other systems reviewed and are negative.   Physical Exam Updated Vital Signs BP (!) 133/76   Pulse 67   Temp 98.2 F (36.8 C) (Oral)   Resp 20   Ht 1.88 m (6\' 2" )   Wt (!) 139.3 kg   SpO2 90%   BMI 39.42 kg/m   Physical Exam Constitutional:      Comments: The patient is well-appearing speaking in full sentences and in no distress  HENT:     Head: Normocephalic and atraumatic.     Nose: Nose normal. No congestion or rhinorrhea.     Mouth/Throat:     Mouth: Mucous membranes are moist.     Pharynx: No pharyngeal swelling or oropharyngeal exudate.  Eyes:     General: No scleral icterus.       Right eye: No discharge.        Left eye: No discharge.     Conjunctiva/sclera: Conjunctivae normal.     Pupils: Pupils are equal, round, and reactive to light.  Neck:     Comments: Unable to see JVD, short neck Cardiovascular:     Rate and Rhythm: Normal rate and regular rhythm.     Comments: No heart murmurs Pulmonary:     Effort: Pulmonary effort is normal. No respiratory distress.     Breath sounds: No wheezing or rales.     Comments: Lung sounds are actually very clear, he is not tachypneic, there is no significant wheezing, there is mild decreased lung sounds bilaterally but able to hear no rales, no wheezing, oxygenating well between 92 and 95% on his baseline 6 L Abdominal:     General: There is no distension.  Tenderness: There is no abdominal tenderness.  Musculoskeletal:     Cervical back: Normal range of motion and neck supple.     Right lower leg: Edema present.     Left lower leg: Edema present.     Comments: Symmetrical scant to 1 pitting edema, lower extremities pretibial to ankle    Skin:    Comments: Hyperpigmentation of the legs, no other significant rashes petechiae or purpura  Neurological:     Comments: Speaks in full sentences, follows commands without difficulty, no facial droop, memory is intact     ED Results / Procedures / Treatments   Labs (all labs ordered are listed, but only abnormal results are displayed) Labs Reviewed  BASIC METABOLIC PANEL - Abnormal; Notable for the following components:      Result Value   Chloride 91 (*)    CO2 38 (*)    Glucose, Bld 118 (*)    BUN 31 (*)    Creatinine, Ser 1.91 (*)    GFR calc non Af Amer 34 (*)    GFR calc Af Amer 39 (*)    All other components within normal limits  CBC - Abnormal; Notable for the following components:   Hemoglobin 12.4 (*)    MCH 25.5 (*)    MCHC 29.7 (*)    RDW 16.1 (*)    All other components within normal limits  I-STAT ARTERIAL BLOOD GAS, ED - Abnormal; Notable for the following components:   pCO2 arterial 60.7 (*)    pO2, Arterial 48 (*)    Bicarbonate 40.4 (*)    TCO2 42 (*)    Acid-Base Excess 13.0 (*)    Potassium 3.3 (*)    All other components within normal limits  BLOOD GAS, ARTERIAL  TROPONIN I (HIGH SENSITIVITY)    EKG EKG Interpretation  Date/Time:  Thursday May 30 2020 19:26:39 EDT Ventricular Rate:  67 PR Interval:  274 QRS Duration: 102 QT Interval:  418 QTC Calculation: 441 R Axis:   -70 Text Interpretation: Sinus rhythm with 1st degree A-V block Left axis deviation Minimal voltage criteria for LVH, may be normal variant ( Cornell product ) Inferior infarct , age undetermined Anteroseptal infarct , age undetermined Abnormal ECG since last tracing no significant change Confirmed by Noemi Chapel [phone]) on 05/30/2020 8:57:47 PM   Radiology DG Chest 2 View  Result Date: 05/30/2020 CLINICAL DATA:  COPD, dyspnea on exertion, hypoxemia EXAM: CHEST - 2 VIEW COMPARISON:  06/13/2019 FINDINGS: Frontal and lateral views of the chest demonstrate an  unremarkable cardiac silhouette. Diffuse scarring and fibrosis again noted, without superimposed airspace disease, effusion, or pneumothorax. Lungs remain hyperinflated. Continued biapical pleural thickening. No acute bony abnormalities. IMPRESSION: 1. Stable emphysema with diffuse scarring and fibrosis. No acute airspace disease. Electronically Signed   By: Randa Ngo M.D.   On: 05/30/2020 19:53    Procedures Procedures (including critical care time)  Medications Ordered in ED Medications  sodium chloride flush (NS) 0.9 % injection 3 mL (3 mLs Intravenous Not Given 05/30/20 2159)  predniSONE (DELTASONE) tablet 40 mg (40 mg Oral Given 05/30/20 2219)  albuterol (PROVENTIL) (2.5 MG/3ML) 0.083% nebulizer solution 5 mg (5 mg Nebulization Given 05/30/20 2221)    ED Course  I have reviewed the triage vital signs and the nursing notes.  Pertinent labs & imaging results that were available during my care of the patient were reviewed by me and considered in my medical decision making (see chart for details).  MDM Rules/Calculators/A&P                          Differential diagnosis for this patient includes both pulmonary and cardiac disease though it seems to be far more likely to be pulmonary.  At this time the patient has a totally normal level of alertness, he does not appear to be hypercapnic, he does not appear to be hypoxic.  The oxygenation that he is getting at home in the 75% range may not be accurate, he has no signs of pneumonia on the x-ray which I have both viewed interpreted and shared with the patient.  There is no signs of anemia, no signs of significant electrolyte dysfunction, his CO2 is slightly elevated at 38 this and ABG will be ordered.  His creatinine is 1.9 and compared to prior values this is slightly taking up as it was last 1.7.  Given his solitary kidney he will need referral to nephrology.  Troponin is normal  EKG is unremarkable and unchanged  The patient is  agreeable to be discharged if the rest of his labs are reassuring including ABG.  Will give prednisone, albuterol treatment, anticipate discharge home to follow-up with pulmonary.  ABG shows hypercapnia however the patient is not short of breath or altered at this time.  He is not confused, he appears very comfortable, I suspect he has some element of chronic hypercapnia given his COPD.  Thankfully the rest of his testing is unremarkable, he will go home on prednisone, he is agreeable to the plan.  I think he is stable at this time for discharge.  Final Clinical Impression(s) / ED Diagnoses Final diagnoses:  COPD exacerbation ([place])    Rx / DC Orders ED Discharge Orders         Ordered    predniSONE (DELTASONE) 20 MG tablet  Daily     Discontinue  Reprint     05/30/20 2311           Noemi Chapel, MD 05/30/20 2312

## 2020-05-30 NOTE — Telephone Encounter (Signed)
Spoke with Melissa  Pt with increased SOB and sats dropping into mid 70's while on 8lpm cont o2  I advised per TP please go to ED asap  She verbalized understanding

## 2020-05-30 NOTE — ED Triage Notes (Signed)
Pt arrives to ED w/ c/o sob w/ exertion. Pt has hx of COPD and wears 6 lpm o2 via [place] at baseline. Pt checks his spo2 on a regular basis and states normally 85-95% but over last 3 days spo2 has been dropping to 75%.

## 2020-05-30 NOTE — Telephone Encounter (Signed)
Once again I find it completely inappropriate that another DME company is dictating how we should best manage our patient's care.  He simply should be recommending that we have follow-up with the patient.  I am glad that these benefits could be offered to the patient.  If the patient is agreeable when we discussed it with him in the office then of course we can proceed forward with how to best support the patient.  These request should be coming directly from the patient not from a DME company.will route to pulmonary leadership as this continues to be an issue.[name]

## 2020-05-31 NOTE — Telephone Encounter (Signed)
I still would like this reviewed by pulmonary leadership.  I have significant concerns regarding Caryl Pina and how he contacts our office to "advocate for patients".  If the patient has not established with his DME company.  I find inappropriate and likely a break in HIPAA to be contacting us regarding this.  The simple answer is is that Caryl Pina should tell the patient to contact our office and we can discuss this in office.  If the patient has not established with them as a DME patient this is completely inappropriate.  Aaron Edelman

## 2020-06-06 ENCOUNTER — Ambulatory Visit (INDEPENDENT_AMBULATORY_CARE_PROVIDER_SITE_OTHER): Payer: Medicare Other | Admitting: Pulmonary Disease

## 2020-06-06 ENCOUNTER — Other Ambulatory Visit: Payer: Self-pay

## 2020-06-06 ENCOUNTER — Encounter: Payer: Self-pay | Admitting: Pulmonary Disease

## 2020-06-06 VITALS — BP 122/85 | HR 82 | Temp 98.0°F | Resp 16 | Ht 74.0 in | Wt 304.4 lb

## 2020-06-06 DIAGNOSIS — J9611 Chronic respiratory failure with hypoxia: Secondary | ICD-10-CM | POA: Diagnosis not present

## 2020-06-06 DIAGNOSIS — J441 Chronic obstructive pulmonary disease with (acute) exacerbation: Secondary | ICD-10-CM | POA: Diagnosis not present

## 2020-06-06 DIAGNOSIS — J432 Centrilobular emphysema: Secondary | ICD-10-CM

## 2020-06-06 MED ORDER — IPRATROPIUM-ALBUTEROL 0.5-2.5 (3) MG/3ML IN SOLN: RESPIRATORY_TRACT | 12 refills | Status: AC

## 2020-06-06 MED ORDER — SODIUM CHLORIDE 3 % IN NEBU
INHALATION_SOLUTION | RESPIRATORY_TRACT | 12 refills | Status: AC | PRN
Start: 2020-06-06 — End: ?

## 2020-06-06 NOTE — Assessment & Plan Note (Signed)
Plan: Continue oxygen therapy Patient could purchase a 10 L concentrator out-of-pocket for around $1500, [employer] DME will follow up with the patient We will start noninvasive ventilation at night

## 2020-06-06 NOTE — Assessment & Plan Note (Signed)
Currently recovering from COPD exacerbation status post prednisone  Plan: We will continue to clinically monitor We will have patient follow-up in 4 weeks

## 2020-06-06 NOTE — Addendum Note (Signed)
Addended by: Kipp Laurence on: 06/06/2020 11:14 AM   Modules accepted: Orders

## 2020-06-06 NOTE — Assessment & Plan Note (Addendum)
Plan: Walk today in office Follow-up in 4 weeks Continue Symbicort 160 and Spiriva Respimat 2.5, till you run out then you can resume Judithann Sauger' Continue Daliresp 500 mcg We will start noninvasive ventilator given hypoxemia and hypercapnia Okay to start hypertonic saline nebs twice daily Okay to place order for nebulizer Okay to start DuoNeb nebulized meds every 6-8 hours as needed for shortness of breath or wheezing

## 2020-06-06 NOTE — Progress Notes (Signed)
Thanks for seeing him  Garner Nash, DO [facility] 06/06/2020 2:58 PM

## 2020-06-06 NOTE — Patient Instructions (Addendum)
You were seen today by Lauraine Rinne, NP  for:   1. Centrilobular emphysema ([place])  Walk today in office  Okay to continue Symbicort 160 and Spiriva Respimat 2.5 until you run out of these medications  When you finish Symbicort 160 and Spiriva Respimat please transition to:  Breztri >>> 2 puffs in the morning right when you wake up, rinse out your mouth after use, 12 hours later 2 puffs, rinse after use >>> Take this daily, no matter what >>> This is not a rescue inhaler   Can start hypertonic saline nebs twice daily  Use flutter valve twice daily 10 breaths each time after hypertonic saline nebs  Can start duo nebs nebulized meds every 6-8 hours as needed for shortness of breath or breathing  Okay to order nebulizer  We will start noninvasive ventilator at night   2. COPD exacerbation ([facility])  Finish prednisone  We will see back in 4 weeks  3. Chronic respiratory failure with hypoxia ([facility])  As discussed today we will place an order for noninvasive ventilator to help with management of your hypoxemia as well as hypercapnia  Continue oxygen therapy as prescribed  >>>maintain oxygen saturations greater than 88 percent  >>>if unable to maintain oxygen saturations please contact the office  >>>do not smoke with oxygen  >>>can use nasal saline gel or nasal saline rinses to moisturize nose if oxygen causes dryness   Follow Up:    Return in about 4 weeks (around 07/04/2020), or if symptoms worsen or fail to improve, for Follow up with [name]-C, Follow up with Dr. Valeta Harms.   Please do your part to reduce the spread of COVID-19:      Reduce your risk of any infection  and COVID19 by using the similar precautions used for avoiding the common cold or flu:  Marland Kitchen Wash your hands often with soap and warm water for at least 20 seconds.  If soap and water are not readily available, use an alcohol-based hand sanitizer with at least 60% alcohol.  . If coughing or sneezing, cover  your mouth and nose by coughing or sneezing into the elbow areas of your shirt or coat, into a tissue or into your sleeve (not your hands). Langley Gauss A MASK when in public  . Avoid shaking hands with others and consider head nods or verbal greetings only. . Avoid touching your eyes, nose, or mouth with unwashed hands.  . Avoid close contact with people who are sick. . Avoid places or events with large numbers of people in one location, like concerts or sporting events. . If you have some symptoms but not all symptoms, continue to monitor at home and seek medical attention if your symptoms worsen. . If you are having a medical emergency, call 911.   [place] / e-Visit: [email or web address]         [facility]: [facility]: [phone]                   [facility]: [phone]     It is flu season:   >>> Best ways to protect herself from the flu: Receive the yearly flu vaccine, practice good hand hygiene washing with soap and also using hand sanitizer when available, eat a nutritious meals, get adequate rest, hydrate appropriately   Please contact the office if your symptoms worsen or you have concerns that you are not  improving.   Thank you for choosing [facility] for your healthcare, and for allowing [place] to partner with you on your healthcare journey. I am thankful to be able to provide care to you today.   [name]-C

## 2020-06-06 NOTE — Progress Notes (Addendum)
@Patient  ID: Harold Hall, male    DOB: 12-15-1945, 74 y.o.   MRN: [id number]  Chief Complaint  Patient presents with  . Hospitalization Follow-up    Patient was unable to keep o2 above 70% he went to hospital and was placed on predisone.     Referring provider: Alroy Dust, L.[name], MD  HPI:  74 year old male former smoker followed in our office for COPD  PMH: Abdominal pain, restless leg syndrome, A. fib, edema Smoker/ Smoking History: Former Smoker.  Quit 2012.  75-pack-year smoking history. Maintenance:  Thayer Jew, MWF azithromycin 250 mg tablet Pt of: Dr. Valeta Harms  06/06/2020  - Visit   74 year old male former smoker followed in our office for COPD and chronic hypoxemic respiratory failure.  He is followed by Dr. Valeta Harms.  Currently maintained on Breztri there are notes in the chart that patient could not afford this.  We will discuss to review today.  Since last being seen there is also been discussions in the chart about switching DME companies.  Patient is currently established with [facility] and he is wondering if he can switch to [facility].  We will discuss and review this today.  Unfortunately patient is also been treated in the emergency room on 05/31/2019 for COPD exacerbation.  He was treated with prednisone CO2 on ABG showed hypercapnia patient was felt to be at baseline  05/30/2020-ABG-pH 7.4, PCO2 60.7, PO2 48, bicarb 40, 05/30/2020-chest x-ray-stable emphysema with diffuse scarring and fibrosis, no acute airspace disease   Patient presenting to our office today.  Reporting he is feeling better since being seen in the emergency room.  He is being treated with prednisone.  He reports that he has no longer having issues with [facility].  He is working closely with Melissa through [facility] to have follow-up and refills of tanks.  Patient reports that he now has 12 tanks at home to be able to use.  And that they can notify [facility] just as long as they give 24 hours notice to get  refills.  They have Melissa's personal contact information.  Patient reports no issues with cost between Symbicort 160, Spiriva Respimat 2.5 or Breztri.   Patient reports that he never formally asked to be placed on triple therapy nebulized meds as previously recommended and documented by [facility].  We will discuss this today.  Patient would prefer to finish Symbicort 160 and Spiriva Respimat stock that he has and then he can start [place].  Patient would like to purchase a 10 L concentrator out-of-pocket.  He is unsure if he is able to do this.  We will investigate and coordinate this today.  We also discussed whether or not the patient be interested in utilizing a noninvasive ventilator.  Questionaires / Pulmonary Flowsheets:   ACT:  No flowsheet data found.  MMRC: mMRC Dyspnea Scale mMRC Score  06/06/2020 3  06/06/2020 3  03/27/2020 3    [place]:  No flowsheet data found.  Tests:   Imaging: December 2017 chest CT images showing significant emphysema with an upper lobe predominance, no clear interstitial lung disease.  01/18/2020-CT chest without contrast-no acute process or evidence of metastatic disease in the chest, advanced bullous type emphysema with biapical pleural-parenchymal scarring, pulmonary artery enlargement suggesting PAH, coronary artery sclerosis, aortic arthrosclerosis  Blood: 09/2016 IgE 4 08/15/2018 Hgb 16.9 mg/dL  Echo: 08/15/2018 LVEF > 55%, mod LVH, dilated left atrial, normal RV, no shunt 08/07/2019-echocardiogram-LV ejection fraction 55 to 94%, grade 1 diastolic dysfunction,  PFT:  [name] 2007:  FEV1 2.10 (46%), ratio 47 PFT 2017 Ratio 47%, FEV1 1.76L (46% pred), FVC 3.78L, TLC 6.45L (82% pred), DLCO 13.18  05/30/2020-ABG-pH 7.4, PCO2 60.7, PO2 48, bicarb 40, 05/30/2020-chest x-ray-stable emphysema with diffuse scarring and fibrosis, no acute airspace disease  FENO:  No results found for: NITRICOXIDE  PFT: PFT Results Latest Ref Rng & Units  04/22/2016  FVC-Pre L 3.58  FVC-Predicted Pre % 69  FVC-Post L 3.78  FVC-Predicted Post % 73  Pre FEV1/FVC % % 49  Post FEV1/FCV % % 47  FEV1-Pre L 1.77  FEV1-Predicted Pre % 46  FEV1-Post L 1.76  DLCO UNC% % 34  DLCO COR %Predicted % 48  TLC L 6.45  TLC % Predicted % 82  RV % Predicted % 93    WALK:  SIX MIN WALK 03/27/2020 06/23/2018 03/30/2017 07/08/2011  Medications - none - -  Supplimental Oxygen during Test? (L/min) Yes Yes Yes No  O2 Flow Rate 8 15 5  -  Type Continuous Continuous Pulse -  Laps - 17 - -  Partial Lap (in Meters) - 8 - -  Baseline Heartrate - 71 - -  Baseline Dyspnea (Borg Scale) - 3 - -  Baseline Fatigue (Borg Scale) - 3 - -  Baseline SPO2 - 88 - -  Heartrate - 90 - -  Dyspnea (Borg Scale) - 4 - -  Fatigue (Borg Scale) - 4 - -  SPO2 - 86 - -  Heartrate - 76 - -  SPO2 - 90 - -  Stopped or Paused before Six Minutes - Yes - -  Other Symptoms at end of Exercise -   - -  Interpretation - Dizziness - -  Distance Completed - 824 - -  Tech Comments: Completed 1 lap desat. on 6 L to 85%, increased oxygen to 8L 90%. Patient at 2 mins was 77% on 4L; and at 42mins in walk was on 15L at 80% pt walked 1 lap at a moderate pace, did not complete laps 2 and 3 d/t sob and dropping 02 sats.  -    Imaging: DG Chest 2 View  Result Date: 05/30/2020 CLINICAL DATA:  COPD, dyspnea on exertion, hypoxemia EXAM: CHEST - 2 VIEW COMPARISON:  06/13/2019 FINDINGS: Frontal and lateral views of the chest demonstrate an unremarkable cardiac silhouette. Diffuse scarring and fibrosis again noted, without superimposed airspace disease, effusion, or pneumothorax. Lungs remain hyperinflated. Continued biapical pleural thickening. No acute bony abnormalities. IMPRESSION: 1. Stable emphysema with diffuse scarring and fibrosis. No acute airspace disease. Electronically Signed   By: Randa Ngo M.D.   On: 05/30/2020 19:53    Lab Results:  CBC    Component Value Date/Time   WBC 8.3  05/30/2020 1940   RBC 4.86 05/30/2020 1940   HGB 13.9 05/30/2020 2255   HCT 41.0 05/30/2020 2255   PLT 205 05/30/2020 1940   MCV 85.8 05/30/2020 1940   MCH 25.5 (L) 05/30/2020 1940   MCHC 29.7 (L) 05/30/2020 1940   RDW 16.1 (H) 05/30/2020 1940   LYMPHSABS 1.7 06/13/2019 1230   MONOABS 0.9 06/13/2019 1230   EOSABS 0.3 06/13/2019 1230   BASOSABS 0.1 06/13/2019 1230    BMET    Component Value Date/Time   NA 140 05/30/2020 2255   NA 146 (H) 08/07/2019 1023   K 3.3 (L) 05/30/2020 2255   CL 91 (L) 05/30/2020 1940   CO2 38 (H) 05/30/2020 1940   GLUCOSE 118 (H) 05/30/2020 1940   BUN 31 (H)  05/30/2020 1940   BUN 27 08/07/2019 1023   CREATININE 1.91 (H) 05/30/2020 1940   CREATININE 1.34 06/10/2011 0904   CALCIUM 9.2 05/30/2020 1940   GFRNONAA 34 (L) 05/30/2020 1940   GFRAA 39 (L) 05/30/2020 1940    BNP    Component Value Date/Time   BNP 16.0 08/07/2019 1023    ProBNP    Component Value Date/Time   PROBNP 71 06/13/2019 1230   PROBNP 459.50 (H) 04/21/2016 1237    Specialty Problems      Pulmonary Problems   COPD (chronic obstructive pulmonary disease) with emphysema ([place])    [place] 2007:  FEV1 2.10 (46%), ratio 47 Stopped smoking 05/2011 Pulm rehab 2012, 2016 On exertional oxygen with portable concentrator. +response to daliresp.  PFT 2017 Ratio 47%, FEV1 1.76L (46% pred), FVC 3.78L, TLC 6.45L (82% pred), DLCO 13.18  05/10/18 - started chronic prof azithromycin      Pulmonary nodule    Ct chest 05/2011:  Nodule questioned on cxr not seen.       COPD exacerbation (HCC)   Chronic respiratory failure with hypoxia (HCC)    3L with exertion, 4-5 with heavy exertoin      Dyspnea    09/2016 HRCT > diffuse bronchial wall thickening and emphysema, no ILD, apical capping noted         No Known Allergies  Immunization History  Administered Date(s) Administered  . Fluad Quad(high Dose 65+) 08/02/2019  . Influenza Split 08/13/2011, 08/21/2012, 08/17/2014, 08/24/2015   . Influenza Whole 08/12/2009, 08/09/2010  . Influenza, High Dose Seasonal PF 08/09/2013, 08/13/2016, 09/05/2017, 07/08/2018  . PFIZER SARS-COV-2 Vaccination 01/18/2020, 02/15/2020  . Pneumococcal Conjugate-13 08/17/2014  . Pneumococcal Polysaccharide-23 08/10/2007    Past Medical History:  Diagnosis Date  . Allergic rhinitis   . Arthritis   . 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 COPD (chronic obstructive pulmonary disease) ([place])   . Erectile dysfunction   . Gout   . Hernia   . History of kidney cancer   . Hypercholesterolemia   . Hypertension   . RLS (restless legs syndrome)   . Sinus problem     Tobacco History: Social History   Tobacco Use  Smoking Status Former Smoker  . Packs/day: 2.00  . Years: 50.00  . Pack years: 100.00  . Types: Cigarettes  . Quit date: 06/04/2011  . Years since quitting: 9.0  Smokeless Tobacco Never Used   Counseling given: Yes   Continue to not smoke  Outpatient Encounter Medications as of 06/06/2020  Medication Sig  . albuterol (PROAIR HFA) 108 (90 Base) MCG/ACT inhaler INHALE 2 PUFFS BY MOUTH INTO THE LUNGS EVERY 4 HOURS AS NEEDED FOR WHEEZING OR SHORTNESS OF BREATH  . allopurinol (ZYLOPRIM) 300 MG tablet Take 300 mg by mouth daily.  Marland Kitchen ALPRAZolam (XANAX) 0.5 MG tablet Take 1 tablet (0.5 mg total) by mouth 2 (two) times daily. (Patient taking differently: Take 0.5 mg by mouth at bedtime. )  . amLODipine (NORVASC) 10 MG tablet Take 10 mg by mouth daily.   Marland Kitchen amoxicillin (AMOXIL) 500 MG capsule Take 500 mg by mouth in the morning, at noon, and at bedtime.  Marland Kitchen atorvastatin (LIPITOR) 20 MG tablet Take 20 mg by mouth daily.  Marland Kitchen azelastine (ASTELIN) 0.1 % nasal spray Place 1 spray into both nostrils 2 (two) times daily. Once daily  . azithromycin (ZITHROMAX) 250 MG tablet Take Monday, Wednesday, Friday only with food.  . Budeson-Glycopyrrol-Formoterol (BREZTRI AEROSPHERE) 160-9-4.8 MCG/ACT AERO  Inhale 2 puffs into the lungs in the  morning and at bedtime.  . budesonide-formoterol (SYMBICORT) 160-4.5 MCG/ACT inhaler Inhale 2 puffs into the lungs 2 (two) times daily.  Marland Kitchen DALIRESP 500 MCG TABS tablet TAKE 1 TABLET EVERY DAY  . ELIQUIS 5 MG TABS tablet TAKE 1 TABLET TWO TIMES DAILY  . fish oil-omega-3 fatty acids 1000 MG capsule Take 1 capsule by mouth 3 (three) times daily.    . flecainide (TAMBOCOR) 100 MG tablet TAKE 1 (ONE) TABLET TWO TIMES DAILY  . furosemide (LASIX) 40 MG tablet TAKE 1 TABLET TWICE DAILY IN THE MORNING AND AT 3 PM AS NEEDED FOR FLUID  . Linaclotide (LINZESS) 145 MCG CAPS capsule Take 145 mcg by mouth every other day.   . losartan-hydrochlorothiazide (HYZAAR) 100-25 MG tablet 1 tablet daily.   . metoprolol succinate (TOPROL-XL) 50 MG 24 hr tablet Take 50 mg by mouth daily. Take with or immediately following a meal.  . montelukast (SINGULAIR) 10 MG tablet TAKE 1 TABLET AT BEDTIME  . Multiple Vitamin (MULITIVITAMIN WITH MINERALS) TABS Take 1 tablet by mouth daily.  . nitroGLYCERIN (NITROSTAT) 0.4 MG SL tablet Place 0.4 mg under the tongue every 5 (five) minutes as needed for chest pain.  Marland Kitchen nystatin (MYCOSTATIN) 100000 UNIT/ML suspension Take 5 mLs (500,000 Units total) by mouth 4 (four) times daily.  . OXYGEN Inhale 6 L into the lungs. 8L if exerting  . potassium chloride (KLOR-CON) 10 MEQ tablet Take 1 tablet (10 mEq total) by mouth 2 (two) times daily as needed (With furosemide).  Marland Kitchen Respiratory Therapy Supplies (FLUTTER) DEVI Use as directed  . rOPINIRole (REQUIP) 0.5 MG tablet Take 1 tablet (0.5 mg total) by mouth daily.  . temazepam (RESTORIL) 15 MG capsule Take 15 mg by mouth at bedtime as needed for sleep.   . Tiotropium Bromide Monohydrate (SPIRIVA RESPIMAT) 2.5 MCG/ACT AERS Inhale 2 puffs into the lungs daily.  . traMADol (ULTRAM) 50 MG tablet TAKE 1 TABLET BY MOUTH THREE TIMES DAILY AS NEEDED FOR MODERATE PAIN  . vitamin C (ASCORBIC ACID) 500 MG tablet Take 500 mg by mouth daily.  . predniSONE  (DELTASONE) 20 MG tablet Take 2 tablets (40 mg total) by mouth daily.   No facility-administered encounter medications on file as of 06/06/2020.     Review of Systems  Review of Systems  Constitutional: Positive for activity change and fatigue. Negative for chills, fever and unexpected weight change.  HENT: Positive for congestion. Negative for postnasal drip, rhinorrhea, sinus pressure, sinus pain and sore throat.   Eyes: Negative.   Respiratory: Positive for shortness of breath and wheezing. Negative for cough.   Cardiovascular: Negative for chest pain and palpitations.  Gastrointestinal: Negative for constipation, diarrhea, nausea and vomiting.  Endocrine: Negative.   Genitourinary: Negative.   Musculoskeletal: Negative.   Skin: Negative.   Neurological: Negative for dizziness and headaches.  Psychiatric/Behavioral: Negative.  Negative for dysphoric mood. The patient is not nervous/anxious.   All other systems reviewed and are negative.    Physical Exam  BP 122/85 (BP Location: Right Arm, Patient Position: Sitting, Cuff Size: Normal)   Pulse 82   Temp 98 F (36.7 C)   Resp 16   Ht 6\' 2"  (1.88 m)   Wt (!) 304 lb 6.4 oz (138.1 kg)   SpO2 93%   BMI 39.08 kg/m   Wt Readings from Last 5 Encounters:  06/06/20 (!) 304 lb 6.4 oz (138.1 kg)  05/30/20 (!) 307 lb (139.3 kg)  05/28/20 (!) 307 lb 9.6 oz (139.5 kg)  03/27/20 (!) 304 lb 12.8 oz (138.3 kg)  03/08/20 285 lb (129.3 kg)    BMI Readings from Last 5 Encounters:  06/06/20 39.08 kg/m  05/30/20 39.42 kg/m  05/28/20 39.49 kg/m  03/27/20 39.13 kg/m  03/08/20 36.59 kg/m     Physical Exam Vitals and nursing note reviewed.  Constitutional:      General: He is not in acute distress.    Appearance: Normal appearance. He is obese.  HENT:     Head: Normocephalic and atraumatic.     Right Ear: Hearing, tympanic membrane, ear canal and external ear normal. There is no impacted cerumen.     Left Ear: Hearing,  tympanic membrane, ear canal and external ear normal. There is no impacted cerumen.     Nose: Rhinorrhea present. No mucosal edema.     Right Turbinates: Not enlarged.     Left Turbinates: Not enlarged.     Mouth/Throat:     Mouth: Mucous membranes are dry.     Pharynx: Oropharynx is clear. No oropharyngeal exudate.  Eyes:     Pupils: Pupils are equal, round, and reactive to light.  Cardiovascular:     Rate and Rhythm: Normal rate and regular rhythm.     Pulses: Normal pulses.     Heart sounds: Normal heart sounds. No murmur heard.   Pulmonary:     Effort: Pulmonary effort is normal.     Breath sounds: Wheezing present. No decreased breath sounds or rales.  Abdominal:     General: Bowel sounds are normal. There is no distension.     Palpations: Abdomen is soft.     Tenderness: There is abdominal tenderness.  Musculoskeletal:     Cervical back: Normal range of motion.     Right lower leg: No edema.     Left lower leg: No edema.  Lymphadenopathy:     Cervical: No cervical adenopathy.  Skin:    General: Skin is warm and dry.     Capillary Refill: Capillary refill takes less than 2 seconds.     Findings: No erythema or rash.  Neurological:     General: No focal deficit present.     Mental Status: He is alert and oriented to person, place, and time.     Motor: No weakness.     Coordination: Coordination normal.     Gait: Gait is intact. Gait normal.  Psychiatric:        Mood and Affect: Mood normal.        Behavior: Behavior normal. Behavior is cooperative.        Thought Content: Thought content normal.        Judgment: Judgment normal.       Assessment & Plan:   Discussion: I believe patient would benefit from noninvasive ventilation. Patient has chronic respiratory failure consequent to COPD. Recent ABG from emergency room is listed below: 05/30/2020-pH 7.4, PCO2 60.7, PO2 48, bicarb 40.4, PCO2 42  Last pulmonary function testing on patient was from 04/22/2016. Those  results are listed below: FVC 3.58 (69% predicted, postbronchodilator ratio 47, postbronchodilator FEV1 1.76 (46% predicted), no bronchodilator response, DLCO 13.18 (34% predicted)  Patient has had a split-night sleep study in November/2019 that did not show obstructive sleep apnea  I believe patient requires frequent durations of ventilator support. Intermittent usage is is insufficient. An example of this is his recent emergency room visit. Believe this will help improve his overall quality of life  as well as his respiratory needs. If we do not provide a noninvasive ventilator. My concern would be the fact a the patient's condition could quickly deteriorate and require hospitalization. Could also cause frequent exacerbations of his COPD. He is at maximum therapy of other medical interventions.  I believe alternative modes of ventilation are insufficient due to persistent hypercapnia. Patient requires frequent durations of ventilator support with battery backup and continues enhanced alarm monitoring. Intermittent support will be insufficient. This patient's condition will quickly deteriorate without an NIMV. Failure to provide or eventual removal of the ventilator may cause serious harm to the patient.  COPD exacerbation Currently recovering from COPD exacerbation status post prednisone  Plan: We will continue to clinically monitor We will have patient follow-up in 4 weeks  COPD (chronic obstructive pulmonary disease) with emphysema ([place]) Plan: Walk today in office Follow-up in 4 weeks Continue Symbicort 160 and Spiriva Respimat 2.5, till you run out then you can resume Judithann Sauger' Continue Daliresp 500 mcg We will start noninvasive ventilator given hypoxemia and hypercapnia Okay to start hypertonic saline nebs twice daily Okay to place order for nebulizer Okay to start DuoNeb nebulized meds every 6-8 hours as needed for shortness of breath or wheezing   Chronic respiratory failure with  hypoxia ([place]) Plan: Continue oxygen therapy Patient could purchase a 10 L concentrator out-of-pocket for around $1500, [facility] will follow up with the patient We will start noninvasive ventilation at night    Return in about 4 weeks (around 07/04/2020), or if symptoms worsen or fail to improve, for Follow up with [name]-C, Follow up with Dr. Valeta Harms.   Lauraine Rinne, NP 06/06/2020   This appointment required 45 minutes of patient care (this includes precharting, chart review, review of results, face-to-face care, etc.).

## 2020-06-10 ENCOUNTER — Other Ambulatory Visit: Payer: Self-pay | Admitting: Cardiology

## 2020-06-10 ENCOUNTER — Other Ambulatory Visit: Payer: Self-pay | Admitting: Pulmonary Disease

## 2020-06-10 DIAGNOSIS — I10 Essential (primary) hypertension: Secondary | ICD-10-CM

## 2020-06-10 DIAGNOSIS — N1832 Chronic kidney disease, stage 3b: Secondary | ICD-10-CM

## 2020-06-10 DIAGNOSIS — I5032 Chronic diastolic (congestive) heart failure: Secondary | ICD-10-CM

## 2020-06-11 NOTE — Telephone Encounter (Signed)
Ok to refill 

## 2020-06-28 ENCOUNTER — Ambulatory Visit: Payer: Medicare Other | Admitting: Cardiology

## 2020-07-02 ENCOUNTER — Telehealth: Payer: Self-pay | Admitting: Pulmonary Disease

## 2020-07-02 NOTE — Telephone Encounter (Signed)
Please advise if triage can assist.

## 2020-07-02 NOTE — Progress Notes (Signed)
@Patient  ID: Harold Hall, male    DOB: 07-Jul-1946, 74 y.o.   MRN: [id number]  Chief Complaint  Patient presents with  . Follow-up    COPD    Referring provider: Alroy Dust, L.[name], MD  HPI:  74 year old male former smoker followed in our office for COPD  PMH: Abdominal pain, restless leg syndrome, A. fib, edema Smoker/ Smoking History: Former Smoker.  Quit 2012.  75-pack-year smoking history. Maintenance:  Thayer Jew, MWF azithromycin  Pt of: Dr. Valeta Harms  07/03/2020  - Visit   74 year old male former smoker followed in our office for COPD.  Patient presented to our office today as a follow-up visit.  He reports that his breathing is doing better since last being seen.  He has some good days and some bad days with his breathing.  Overall though he feels that he is doing better.  He does feel that he is in less of a "fog" since starting the noninvasive ventilator.  He has been using this for greater than 4 hours each day.  With naps, sleeping as well as occasionally when watching TV.  He feels that this is helped significantly.  Patient reports adherence to Symbicort 160 and Spiriva Respimat while he finishes out what he has on hand.  Then he plans to transition to Brice.  Patient uses his albuterol nebulizer 1 time daily.  He reports adherence to Daliresp as well as Monday Wednesday Friday azithromycin.  He continues to be established with [facility].  We do not have a compliance report of the noninvasive ventilator.  I will work on this today.  Questionaires / Pulmonary Flowsheets:   ACT:  No flowsheet data found.  MMRC: mMRC Dyspnea Scale mMRC Score  07/03/2020 3  06/06/2020 3  06/06/2020 3    Epworth:  No flowsheet data found.  Tests:   Imaging: December 2017 chest CT images showing significant emphysema with an upper lobe predominance, no clear interstitial lung disease.  01/18/2020-CT chest without contrast-no acute process or evidence of metastatic disease  in the chest, advanced bullous type emphysema with biapical pleural-parenchymal scarring, pulmonary artery enlargement suggesting PAH, coronary artery sclerosis, aortic arthrosclerosis  Blood: 09/2016 IgE 4 08/15/2018 Hgb 16.9 mg/dL  Echo: 08/15/2018 LVEF > 55%, mod LVH, dilated left atrial, normal RV, no shunt 08/07/2019-echocardiogram-LV ejection fraction 55 to 96%, grade 1 diastolic dysfunction,  PFT: Spiro 2007:  FEV1 2.10 (46%), ratio 47 PFT 2017 Ratio 47%, FEV1 1.76L (46% pred), FVC 3.78L, TLC 6.45L (82% pred), DLCO 13.18  05/30/2020-ABG-pH 7.4, PCO2 60.7, PO2 48, bicarb 40, 05/30/2020-chest x-ray-stable emphysema with diffuse scarring and fibrosis, no acute airspace disease   FENO:  No results found for: NITRICOXIDE  PFT: PFT Results Latest Ref Rng & Units 04/22/2016  FVC-Pre L 3.58  FVC-Predicted Pre % 69  FVC-Post L 3.78  FVC-Predicted Post % 73  Pre FEV1/FVC % % 49  Post FEV1/FCV % % 47  FEV1-Pre L 1.77  FEV1-Predicted Pre % 46  FEV1-Post L 1.76  DLCO uncorrected ml/min/mmHg 13.18  DLCO UNC% % 34  DLCO corrected ml/min/mmHg 12.67  DLCO COR %Predicted % 33  DLVA Predicted % 48  TLC L 6.45  TLC % Predicted % 82  RV % Predicted % 93    WALK:  SIX MIN WALK 03/27/2020 06/23/2018 03/30/2017 07/08/2011  Medications - none - -  Supplimental Oxygen during Test? (L/min) Yes Yes Yes No  O2 Flow Rate 8 15 5  -  Type Continuous Continuous Pulse -  Laps -  17 - -  Partial Lap (in Meters) - 8 - -  Baseline Heartrate - 71 - -  Baseline Dyspnea (Borg Scale) - 3 - -  Baseline Fatigue (Borg Scale) - 3 - -  Baseline SPO2 - 88 - -  Heartrate - 90 - -  Dyspnea (Borg Scale) - 4 - -  Fatigue (Borg Scale) - 4 - -  SPO2 - 86 - -  Heartrate - 76 - -  SPO2 - 90 - -  Stopped or Paused before Six Minutes - Yes - -  Other Symptoms at end of Exercise -   - -  Interpretation - Dizziness - -  Distance Completed - 824 - -  Tech Comments: Completed 1 lap desat. on 6 L to 85%, increased  oxygen to 8L 90%. Patient at 2 mins was 77% on 4L; and at 90mins in walk was on 15L at 80% pt walked 1 lap at a moderate pace, did not complete laps 2 and 3 d/t sob and dropping 02 sats.  -    Imaging: No results found.  Lab Results:  CBC    Component Value Date/Time   WBC 8.3 05/30/2020 1940   RBC 4.86 05/30/2020 1940   HGB 13.9 05/30/2020 2255   HCT 41.0 05/30/2020 2255   PLT 205 05/30/2020 1940   MCV 85.8 05/30/2020 1940   MCH 25.5 (L) 05/30/2020 1940   MCHC 29.7 (L) 05/30/2020 1940   RDW 16.1 (H) 05/30/2020 1940   LYMPHSABS 1.7 06/13/2019 1230   MONOABS 0.9 06/13/2019 1230   EOSABS 0.3 06/13/2019 1230   BASOSABS 0.1 06/13/2019 1230    BMET    Component Value Date/Time   NA 140 05/30/2020 2255   NA 146 (H) 08/07/2019 1023   K 3.3 (L) 05/30/2020 2255   CL 91 (L) 05/30/2020 1940   CO2 38 (H) 05/30/2020 1940   GLUCOSE 118 (H) 05/30/2020 1940   BUN 31 (H) 05/30/2020 1940   BUN 27 08/07/2019 1023   CREATININE 1.91 (H) 05/30/2020 1940   CREATININE 1.34 06/10/2011 0904   CALCIUM 9.2 05/30/2020 1940   GFRNONAA 34 (L) 05/30/2020 1940   GFRAA 39 (L) 05/30/2020 1940    BNP    Component Value Date/Time   BNP 16.0 08/07/2019 1023    ProBNP    Component Value Date/Time   PROBNP 71 06/13/2019 1230   PROBNP 459.50 (H) 04/21/2016 1237    Specialty Problems      Pulmonary Problems   COPD (chronic obstructive pulmonary disease) with emphysema ([place])    Spiro 2007:  FEV1 2.10 (46%), ratio 47 Stopped smoking 05/2011 Pulm rehab 2012, 2016 On exertional oxygen with portable concentrator. +response to daliresp.  PFT 2017 Ratio 47%, FEV1 1.76L (46% pred), FVC 3.78L, TLC 6.45L (82% pred), DLCO 13.18  05/10/18 - started chronic prof azithromycin      Pulmonary nodule    Ct chest 05/2011:  Nodule questioned on cxr not seen.       COPD exacerbation (HCC)   Chronic respiratory failure with hypoxia (HCC)    3L with exertion, 4-5 with heavy exertoin      Dyspnea     09/2016 HRCT > diffuse bronchial wall thickening and emphysema, no ILD, apical capping noted         No Known Allergies  Immunization History  Administered Date(s) Administered  . Fluad Quad(high Dose 65+) 08/02/2019  . Influenza Split 08/13/2011, 08/21/2012, 08/17/2014, 08/24/2015  . Influenza Whole 08/12/2009, 08/09/2010  . Influenza,  High Dose Seasonal PF 08/09/2013, 08/13/2016, 09/05/2017, 07/08/2018  . PFIZER SARS-COV-2 Vaccination 01/18/2020, 02/15/2020  . Pneumococcal Conjugate-13 08/17/2014  . Pneumococcal Polysaccharide-23 08/10/2007    Past Medical History:  Diagnosis Date  . Allergic rhinitis   . Arthritis   . Cancer [facility])    left renal -solitary kidney  . COPD (chronic obstructive pulmonary disease) ([place])   . Erectile dysfunction   . Gout   . Hernia   . History of kidney cancer   . Hypercholesterolemia   . Hypertension   . RLS (restless legs syndrome)   . Sinus problem     Tobacco History: Social History   Tobacco Use  Smoking Status Former Smoker  . Packs/day: 2.00  . Years: 50.00  . Pack years: 100.00  . Types: Cigarettes  . Quit date: 06/04/2011  . Years since quitting: 9.0  Smokeless Tobacco Never Used   Counseling given: Not Answered   Continue to not smoke  Outpatient Encounter Medications as of 07/03/2020  Medication Sig  . albuterol (PROAIR HFA) 108 (90 Base) MCG/ACT inhaler INHALE 2 PUFFS BY MOUTH INTO THE LUNGS EVERY 4 HOURS AS NEEDED FOR WHEEZING OR SHORTNESS OF BREATH  . allopurinol (ZYLOPRIM) 300 MG tablet Take 300 mg by mouth daily.  Marland Kitchen ALPRAZolam (XANAX) 0.5 MG tablet Take 1 tablet (0.5 mg total) by mouth 2 (two) times daily. (Patient taking differently: Take 0.5 mg by mouth at bedtime. )  . amLODipine (NORVASC) 10 MG tablet Take 10 mg by mouth daily.   Marland Kitchen atorvastatin (LIPITOR) 20 MG tablet Take 20 mg by mouth daily.  Marland Kitchen azelastine (ASTELIN) 0.1 % nasal spray Place 1 spray into both nostrils 2 (two) times daily. Once daily  .  azithromycin (ZITHROMAX) 250 MG tablet Take Monday, Wednesday, Friday only with food.  . Budeson-Glycopyrrol-Formoterol (BREZTRI AEROSPHERE) 160-9-4.8 MCG/ACT AERO Inhale 2 puffs into the lungs in the morning and at bedtime.  . budesonide-formoterol (SYMBICORT) 160-4.5 MCG/ACT inhaler Inhale 2 puffs into the lungs 2 (two) times daily.  Marland Kitchen DALIRESP 500 MCG TABS tablet TAKE 1 TABLET EVERY DAY  . ELIQUIS 5 MG TABS tablet TAKE 1 TABLET TWO TIMES DAILY  . fish oil-omega-3 fatty acids 1000 MG capsule Take 1 capsule by mouth 3 (three) times daily.    . flecainide (TAMBOCOR) 100 MG tablet TAKE 1  TABLET TWO TIMES DAILY  . furosemide (LASIX) 40 MG tablet TAKE 1 TABLET TWICE DAILY IN THE MORNING AND AT 3 PM AS NEEDED FOR FLUID  . ipratropium-albuterol (DUONEB) 0.5-2.5 (3) MG/3ML SOLN Use every 6-8 hours as needed for wheezing and SOB.  . Linaclotide (LINZESS) 145 MCG CAPS capsule Take 145 mcg by mouth every other day.   . losartan-hydrochlorothiazide (HYZAAR) 100-25 MG tablet 1 tablet daily.   . metoprolol succinate (TOPROL-XL) 50 MG 24 hr tablet Take 50 mg by mouth daily. Take with or immediately following a meal.  . montelukast (SINGULAIR) 10 MG tablet TAKE 1 TABLET AT BEDTIME  . Multiple Vitamin (MULITIVITAMIN WITH MINERALS) TABS Take 1 tablet by mouth daily.  . nitroGLYCERIN (NITROSTAT) 0.4 MG SL tablet Place 0.4 mg under the tongue every 5 (five) minutes as needed for chest pain.  Marland Kitchen nystatin (MYCOSTATIN) 100000 UNIT/ML suspension Take 5 mLs (500,000 Units total) by mouth 4 (four) times daily.  . OXYGEN Inhale 6 L into the lungs. 8L if exerting  . potassium chloride (KLOR-CON) 10 MEQ tablet TAKE 1 TABLET TWO TIMES DAILY AS NEEDED WITH FUROSEMIDE  . Respiratory Therapy Supplies (FLUTTER)  DEVI Use as directed  . rOPINIRole (REQUIP) 0.5 MG tablet Take 1 tablet (0.5 mg total) by mouth daily.  . sodium chloride HYPERTONIC 3 % nebulizer solution Take by nebulization as needed for other.  . temazepam  (RESTORIL) 15 MG capsule Take 15 mg by mouth at bedtime as needed for sleep.   . Tiotropium Bromide Monohydrate (SPIRIVA RESPIMAT) 2.5 MCG/ACT AERS Inhale 2 puffs into the lungs daily.  . traMADol (ULTRAM) 50 MG tablet TAKE 1 TABLET BY MOUTH THREE TIMES DAILY AS NEEDED FOR MODERATE PAIN  . vitamin C (ASCORBIC ACID) 500 MG tablet Take 500 mg by mouth daily.  . [DISCONTINUED] amoxicillin (AMOXIL) 500 MG capsule Take 500 mg by mouth in the morning, at noon, and at bedtime.  . [DISCONTINUED] predniSONE (DELTASONE) 20 MG tablet Take 2 tablets (40 mg total) by mouth daily.   No facility-administered encounter medications on file as of 07/03/2020.     Review of Systems  Review of Systems  Constitutional: Positive for fatigue. Negative for activity change, chills, fever and unexpected weight change.  HENT: Negative for postnasal drip, rhinorrhea, sinus pressure, sinus pain and sore throat.   Eyes: Negative.   Respiratory: Positive for cough and shortness of breath. Negative for wheezing.   Cardiovascular: Negative for chest pain and palpitations.  Gastrointestinal: Negative for constipation, diarrhea, nausea and vomiting.  Endocrine: Negative.   Genitourinary: Negative.   Musculoskeletal: Negative.   Skin: Negative.   Neurological: Negative for dizziness and headaches.  Psychiatric/Behavioral: Negative.  Negative for dysphoric mood. The patient is not nervous/anxious.   All other systems reviewed and are negative.    Physical Exam  BP 124/62 (BP Location: Left Arm, Cuff Size: Normal)   Pulse 64   Temp 97.9 F (36.6 C) (Oral)   Ht 6\' 2"  (1.88 m)   Wt (!) 304 lb 11.2 oz (138.2 kg)   SpO2 92%   BMI 39.12 kg/m   Wt Readings from Last 5 Encounters:  07/03/20 (!) 304 lb 11.2 oz (138.2 kg)  06/06/20 (!) 304 lb 6.4 oz (138.1 kg)  05/30/20 (!) 307 lb (139.3 kg)  05/28/20 (!) 307 lb 9.6 oz (139.5 kg)  03/27/20 (!) 304 lb 12.8 oz (138.3 kg)    BMI Readings from Last 5 Encounters:    07/03/20 39.12 kg/m  06/06/20 39.08 kg/m  05/30/20 39.42 kg/m  05/28/20 39.49 kg/m  03/27/20 39.13 kg/m     Physical Exam Vitals and nursing note reviewed.  Constitutional:      General: He is not in acute distress.    Appearance: Normal appearance. He is obese.  HENT:     Head: Normocephalic and atraumatic.     Right Ear: Hearing and external ear normal.     Left Ear: Hearing and external ear normal.     Nose: No mucosal edema.     Right Turbinates: Not enlarged.     Left Turbinates: Not enlarged.  Eyes:     Pupils: Pupils are equal, round, and reactive to light.  Cardiovascular:     Rate and Rhythm: Normal rate and regular rhythm.     Pulses: Normal pulses.     Heart sounds: Normal heart sounds. No murmur heard.   Pulmonary:     Effort: Pulmonary effort is normal.     Breath sounds: No decreased breath sounds, wheezing or rales.     Comments: Diminished breath sounds throughout exam Musculoskeletal:     Cervical back: Normal range of motion.  Right lower leg: No edema.     Left lower leg: No edema.  Lymphadenopathy:     Cervical: No cervical adenopathy.  Skin:    General: Skin is warm and dry.     Capillary Refill: Capillary refill takes less than 2 seconds.     Findings: No erythema or rash.  Neurological:     General: No focal deficit present.     Mental Status: He is alert and oriented to person, place, and time.     Motor: No weakness.     Coordination: Coordination normal.     Gait: Gait is intact. Gait normal.  Psychiatric:        Mood and Affect: Mood normal.        Behavior: Behavior normal. Behavior is cooperative.        Thought Content: Thought content normal.        Judgment: Judgment normal.       Assessment & Plan:   COPD (chronic obstructive pulmonary disease) with emphysema (HCC) Plan: Continue Symbicort 160 and Spiriva Respimat 2.5, till you run out then you can resume Judithann Sauger' Continue Daliresp 500 mcg Continue Monday  Wednesday Friday azithromycin Continue noninvasive ventilator given hypoxemia and hypercapnia Continue hypertonic saline nebs twice daily Continue DuoNeb nebulized meds every 6-8 hours as needed for shortness of breath or wheezing 8-week follow-up with our office   Chronic respiratory failure with hypoxia ([place]) Plan: Continue oxygen therapy Continue noninvasive ventilator at night Contacted [facility] for compliance report on NIV as well as to obtain estimate on out-of-pocket cost for 10 L concentrator    Return in about 2 months (around 09/02/2020), or if symptoms worsen or fail to improve, for Follow up with Dr. Valeta Harms.   Lauraine Rinne, NP 07/03/2020   This appointment required 28 minutes of patient care (this includes precharting, chart review, review of results, face-to-face care, etc.).

## 2020-07-03 ENCOUNTER — Encounter: Payer: Self-pay | Admitting: Pulmonary Disease

## 2020-07-03 ENCOUNTER — Ambulatory Visit (INDEPENDENT_AMBULATORY_CARE_PROVIDER_SITE_OTHER): Payer: Medicare Other | Admitting: Pulmonary Disease

## 2020-07-03 ENCOUNTER — Other Ambulatory Visit: Payer: Self-pay

## 2020-07-03 VITALS — BP 124/62 | HR 64 | Temp 97.9°F | Ht 74.0 in | Wt 304.7 lb

## 2020-07-03 DIAGNOSIS — J9611 Chronic respiratory failure with hypoxia: Secondary | ICD-10-CM | POA: Diagnosis not present

## 2020-07-03 DIAGNOSIS — J432 Centrilobular emphysema: Secondary | ICD-10-CM | POA: Diagnosis not present

## 2020-07-03 NOTE — Assessment & Plan Note (Signed)
Plan: Continue Symbicort 160 and Spiriva Respimat 2.5, till you run out then you can resume Harold Hall' Continue Daliresp 500 mcg Continue Monday Wednesday Friday azithromycin Continue noninvasive ventilator given hypoxemia and hypercapnia Continue hypertonic saline nebs twice daily Continue DuoNeb nebulized meds every 6-8 hours as needed for shortness of breath or wheezing 8-week follow-up with our office

## 2020-07-03 NOTE — Patient Instructions (Addendum)
You were seen today by Lauraine Rinne, NP  for:   1. Centrilobular emphysema (HCC)  Okay to finish up Symbicort 160 and Spiriva Respimat as previously discussed then start:  Breztri >>> 2 puffs in the morning right when you wake up, rinse out your mouth after use, 12 hours later 2 puffs, rinse after use >>> Take this daily, no matter what >>> This is not a rescue inhaler   Continue Daliresp  Continue Monday Wednesday Friday azithromycin   Note your daily symptoms > remember "red flags" for COPD:   >>>Increase in cough >>>increase in sputum production >>>increase in shortness of breath or activity  intolerance.   If you notice these symptoms, please call the office to be seen.    2. Chronic respiratory failure with hypoxia (HCC)  Continue oxygen therapy as prescribed  >>>maintain oxygen saturations greater than 88 percent  >>>if unable to maintain oxygen saturations please contact the office  >>>do not smoke with oxygen  >>>can use nasal saline gel or nasal saline rinses to moisturize nose if oxygen causes dryness  Continue noninvasive ventilator at night, we will work on obtaining a download   Follow Up:    Return in about 2 months (around 09/02/2020), or if symptoms worsen or fail to improve, for Follow up with Dr. Valeta Harms.   Please do your part to reduce the spread of COVID-19:      Reduce your risk of any infection  and COVID19 by using the similar precautions used for avoiding the common cold or flu:  Marland Kitchen Wash your hands often with soap and warm water for at least 20 seconds.  If soap and water are not readily available, use an alcohol-based hand sanitizer with at least 60% alcohol.  . If coughing or sneezing, cover your mouth and nose by coughing or sneezing into the elbow areas of your shirt or coat, into a tissue or into your sleeve (not your hands). Langley Gauss A MASK when in public  . Avoid shaking hands with others and consider head nods or verbal greetings  only. . Avoid touching your eyes, nose, or mouth with unwashed hands.  . Avoid close contact with people who are sick. . Avoid places or events with large numbers of people in one location, like concerts or sporting events. . If you have some symptoms but not all symptoms, continue to monitor at home and seek medical attention if your symptoms worsen. . If you are having a medical emergency, call 911.   [place] / e-Visit: [email or web address]         [facility]: [facility]: [phone]                   [facility]: [phone]     It is flu season:   >>> Best ways to protect herself from the flu: Receive the yearly flu vaccine, practice good hand hygiene washing with soap and also using hand sanitizer when available, eat a nutritious meals, get adequate rest, hydrate appropriately   Please contact the office if your symptoms worsen or you have concerns that you are not improving.   Thank you for choosing [facility] for your healthcare, and for allowing [place] to partner with you on your healthcare journey. I am thankful to be able to provide care to you today.   [name]-C

## 2020-07-03 NOTE — Telephone Encounter (Signed)
Yes we did get the form to sign but the form was not complete I faxed it to [facility] asking her to have them refax

## 2020-07-03 NOTE — Assessment & Plan Note (Signed)
Plan: Continue oxygen therapy Continue noninvasive ventilator at night Contacted [facility] for compliance report on NIV as well as to obtain estimate on out-of-pocket cost for 10 L concentrator

## 2020-07-08 ENCOUNTER — Telehealth: Payer: Self-pay | Admitting: Pulmonary Disease

## 2020-07-08 MED ORDER — PREDNISONE 10 MG PO TABS
ORAL_TABLET | ORAL | 0 refills | Status: DC
Start: 2020-07-08 — End: 2020-08-07

## 2020-07-08 NOTE — Telephone Encounter (Signed)
Spoke with patient's wife regarding a script sent into his pharmacy for Prednisone 4 tabs for 3 days, then 3 tabs for 3 days, 2 tabs for 3 days, then 1 tab for 3 days, then stop.Advised patient's wife that if patient's  starts to have chest pain or oxygen levels dropping below 88% with increased oxygen needs he needs to notify our office or contact 911.Patient's was voice was understanding. Nothing else further needed.

## 2020-07-08 NOTE — Telephone Encounter (Signed)
Primary Pulmonologist: [facility] Last office visit and with whom: 07/03/20 with Harold Hall What do we see them for (pulmonary problems): emphysema Last OV assessment/plan:  Assessment & Plan:   COPD (chronic obstructive pulmonary disease) with emphysema ([place]) Plan: Continue Symbicort 160 and Spiriva Respimat 2.5, till you run out then you can resume Judithann Sauger' Continue Daliresp 500 mcg Continue Monday Wednesday Friday azithromycin Continue noninvasive ventilator given hypoxemia and hypercapnia Continue hypertonic saline nebs twice daily Continue DuoNeb nebulized meds every 6-8 hours as needed for shortness of breath or wheezing 8-week follow-up with our office   Chronic respiratory failure with hypoxia ([place]) Plan: Continue oxygen therapy Continue noninvasive ventilator at night Contacted [facility] for compliance report on NIV as well as to obtain estimate on out-of-pocket cost for 10 L concentrator    Return in about 2 months (around 09/02/2020), or if symptoms worsen or fail to improve, for Follow up with Dr. Valeta Harms.   Lauraine Rinne, NP 07/03/2020  Was appointment offered to patient (explain)?  Requesting prednisone to be sent in   Reason for call: Called and spoke with pt's wife [name] who stated when pt stands up quick, he will begin to get breathless and this is what happened the previous time prior to pt going to the hospital. She also stated that pt was satting at 87% yesterday 8/29 with ambulation. Pt is using 6L O2.  I had Melissa check pt's sats and she stated that pt was satting at 90% on 6L at rest.  Pt has not had a fever as temp while on the phone with pt was 98.4.  Pt denies any complaints of chills.  Pt denies any complaints of wheezing and also has not been having a lot of phlegm.  Pt has had to use duoneb at least once or twice daily to help with his symptoms. Pt is using symbicort and spiriva as prescribed. Pt has not had to use the rescue inhaler. Pt is taking  the azithromycin three times weekly as prescribed. Pt is also taking all other meds as directed.  Melissa is requesting to have a prednisone prescription to see if it would help with his breathing as this usually helps pt when he begins to have problems with his breathing. Pt has received his covid vaccines.  Tammy, please advise.   No Known Allergies  Immunization History  Administered Date(s) Administered  . Fluad Quad(high Dose 65+) 08/02/2019  . Influenza Split 08/13/2011, 08/21/2012, 08/17/2014, 08/24/2015  . Influenza Whole 08/12/2009, 08/09/2010  . Influenza, High Dose Seasonal PF 08/09/2013, 08/13/2016, 09/05/2017, 07/08/2018  . PFIZER SARS-COV-2 Vaccination 01/18/2020, 02/15/2020  . Pneumococcal Conjugate-13 08/17/2014  . Pneumococcal Polysaccharide-23 08/10/2007

## 2020-07-08 NOTE — Telephone Encounter (Signed)
Will send to [facility] as was just seen by him on 07/03/20

## 2020-07-08 NOTE — Telephone Encounter (Signed)
Okay to call in:  Prednisone 10mg  tablet  >>>4 tabs for 3 days, then 3 tabs for 3 days, 2 tabs for 3 days, then 1 tab for 3 days, then stop >>>take with food  >>>take in the morning   Please notify the patient if he starts to have chest pain or oxygen levels dropping below 88% with increased oxygen needs he needs to notify our office or contact 911.  Wyn Quaker, FNP

## 2020-07-09 NOTE — Telephone Encounter (Signed)
Harold Hall, please advise if this has been resolved. Thanks.

## 2020-07-10 NOTE — Telephone Encounter (Signed)
I will get with [facility] tomorrow about this one.

## 2020-07-11 ENCOUNTER — Ambulatory Visit: Payer: Medicare Other | Admitting: Cardiology

## 2020-07-11 NOTE — Telephone Encounter (Signed)
Urgent CM sent to [facility] to help me figure out the status of this CMN.

## 2020-07-20 NOTE — Telephone Encounter (Signed)
[facility], did this CMN ever get done? Thanks!

## 2020-07-22 NOTE — Telephone Encounter (Signed)
URGENT F/U SM sent to [facility]. I called Melissa who advised me to go ahead & close this message & if it hasn't been taken care of, she will let us know.

## 2020-08-04 ENCOUNTER — Emergency Department (HOSPITAL_COMMUNITY): Payer: Medicare Other

## 2020-08-04 ENCOUNTER — Inpatient Hospital Stay (HOSPITAL_COMMUNITY)
Admission: EM | Admit: 2020-08-04 | Discharge: 2020-08-07 | DRG: 190 | Disposition: A | Discharge status: Home Health | Payer: Medicare Other | Attending: Internal Medicine | Admitting: Internal Medicine

## 2020-08-04 ENCOUNTER — Other Ambulatory Visit: Payer: Self-pay

## 2020-08-04 ENCOUNTER — Inpatient Hospital Stay (HOSPITAL_COMMUNITY): Payer: Medicare Other

## 2020-08-04 ENCOUNTER — Encounter (HOSPITAL_COMMUNITY): Payer: Self-pay

## 2020-08-04 DIAGNOSIS — G2581 Restless legs syndrome: Secondary | ICD-10-CM | POA: Diagnosis present

## 2020-08-04 DIAGNOSIS — Z85828 Personal history of other malignant neoplasm of skin: Secondary | ICD-10-CM

## 2020-08-04 DIAGNOSIS — J439 Emphysema, unspecified: Principal | ICD-10-CM | POA: Diagnosis present

## 2020-08-04 DIAGNOSIS — K5900 Constipation, unspecified: Secondary | ICD-10-CM | POA: Diagnosis present

## 2020-08-04 DIAGNOSIS — J9621 Acute and chronic respiratory failure with hypoxia: Secondary | ICD-10-CM | POA: Diagnosis not present

## 2020-08-04 DIAGNOSIS — N1832 Chronic kidney disease, stage 3b: Secondary | ICD-10-CM | POA: Diagnosis present

## 2020-08-04 DIAGNOSIS — N179 Acute kidney failure, unspecified: Secondary | ICD-10-CM | POA: Diagnosis present

## 2020-08-04 DIAGNOSIS — J9622 Acute and chronic respiratory failure with hypercapnia: Secondary | ICD-10-CM | POA: Diagnosis present

## 2020-08-04 DIAGNOSIS — I130 Hypertensive heart and chronic kidney disease with heart failure and stage 1 through stage 4 chronic kidney disease, or unspecified chronic kidney disease: Secondary | ICD-10-CM | POA: Diagnosis present

## 2020-08-04 DIAGNOSIS — I5032 Chronic diastolic (congestive) heart failure: Secondary | ICD-10-CM | POA: Diagnosis present

## 2020-08-04 DIAGNOSIS — E1165 Type 2 diabetes mellitus with hyperglycemia: Secondary | ICD-10-CM | POA: Diagnosis not present

## 2020-08-04 DIAGNOSIS — M109 Gout, unspecified: Secondary | ICD-10-CM | POA: Diagnosis present

## 2020-08-04 DIAGNOSIS — Z66 Do not resuscitate: Secondary | ICD-10-CM | POA: Diagnosis present

## 2020-08-04 DIAGNOSIS — Z7901 Long term (current) use of anticoagulants: Secondary | ICD-10-CM | POA: Diagnosis not present

## 2020-08-04 DIAGNOSIS — Z9981 Dependence on supplemental oxygen: Secondary | ICD-10-CM | POA: Diagnosis not present

## 2020-08-04 DIAGNOSIS — Z85528 Personal history of other malignant neoplasm of kidney: Secondary | ICD-10-CM

## 2020-08-04 DIAGNOSIS — Z20822 Contact with and (suspected) exposure to covid-19: Secondary | ICD-10-CM | POA: Diagnosis present

## 2020-08-04 DIAGNOSIS — J441 Chronic obstructive pulmonary disease with (acute) exacerbation: Secondary | ICD-10-CM | POA: Diagnosis not present

## 2020-08-04 DIAGNOSIS — E7800 Pure hypercholesterolemia, unspecified: Secondary | ICD-10-CM | POA: Diagnosis present

## 2020-08-04 DIAGNOSIS — Z8546 Personal history of malignant neoplasm of prostate: Secondary | ICD-10-CM | POA: Diagnosis not present

## 2020-08-04 DIAGNOSIS — Z79899 Other long term (current) drug therapy: Secondary | ICD-10-CM | POA: Diagnosis not present

## 2020-08-04 DIAGNOSIS — R0902 Hypoxemia: Secondary | ICD-10-CM | POA: Diagnosis not present

## 2020-08-04 DIAGNOSIS — I959 Hypotension, unspecified: Secondary | ICD-10-CM | POA: Diagnosis not present

## 2020-08-04 DIAGNOSIS — E785 Hyperlipidemia, unspecified: Secondary | ICD-10-CM | POA: Diagnosis present

## 2020-08-04 DIAGNOSIS — E669 Obesity, unspecified: Secondary | ICD-10-CM | POA: Diagnosis present

## 2020-08-04 DIAGNOSIS — T501X5A Adverse effect of loop [high-ceiling] diuretics, initial encounter: Secondary | ICD-10-CM | POA: Diagnosis present

## 2020-08-04 DIAGNOSIS — J449 Chronic obstructive pulmonary disease, unspecified: Secondary | ICD-10-CM | POA: Diagnosis present

## 2020-08-04 DIAGNOSIS — R0602 Shortness of breath: Secondary | ICD-10-CM | POA: Diagnosis not present

## 2020-08-04 DIAGNOSIS — R609 Edema, unspecified: Secondary | ICD-10-CM | POA: Diagnosis not present

## 2020-08-04 DIAGNOSIS — I2781 Cor pulmonale (chronic): Secondary | ICD-10-CM | POA: Diagnosis present

## 2020-08-04 DIAGNOSIS — Z23 Encounter for immunization: Secondary | ICD-10-CM | POA: Diagnosis not present

## 2020-08-04 DIAGNOSIS — J9611 Chronic respiratory failure with hypoxia: Secondary | ICD-10-CM | POA: Diagnosis present

## 2020-08-04 DIAGNOSIS — Z87891 Personal history of nicotine dependence: Secondary | ICD-10-CM

## 2020-08-04 DIAGNOSIS — Z905 Acquired absence of kidney: Secondary | ICD-10-CM

## 2020-08-04 DIAGNOSIS — I361 Nonrheumatic tricuspid (valve) insufficiency: Secondary | ICD-10-CM | POA: Diagnosis not present

## 2020-08-04 DIAGNOSIS — Z209 Contact with and (suspected) exposure to unspecified communicable disease: Secondary | ICD-10-CM | POA: Diagnosis not present

## 2020-08-04 DIAGNOSIS — I480 Paroxysmal atrial fibrillation: Secondary | ICD-10-CM | POA: Diagnosis present

## 2020-08-04 LAB — COMPREHENSIVE METABOLIC PANEL
ALT: 18 U/L (ref 0–44)
AST: 17 U/L (ref 15–41)
Albumin: 3.2 g/dL — ABNORMAL LOW (ref 3.5–5.0)
Alkaline Phosphatase: 70 U/L (ref 38–126)
Anion gap: 10 (ref 5–15)
BUN: 29 mg/dL — ABNORMAL HIGH (ref 8–23)
CO2: 41 mmol/L — ABNORMAL HIGH (ref 22–32)
Calcium: 9.2 mg/dL (ref 8.9–10.3)
Chloride: 90 mmol/L — ABNORMAL LOW (ref 98–111)
Creatinine, Ser: 1.66 mg/dL — ABNORMAL HIGH (ref 0.61–1.24)
GFR calc Af Amer: 47 mL/min — ABNORMAL LOW (ref >60)
GFR calc non Af Amer: 40 mL/min — ABNORMAL LOW (ref >60)
Glucose, Bld: 214 mg/dL — ABNORMAL HIGH (ref 70–99)
Potassium: 3.5 mmol/L (ref 3.5–5.1)
Sodium: 141 mmol/L (ref 135–145)
Total Bilirubin: 0.9 mg/dL (ref 0.3–1.2)
Total Protein: 6.5 g/dL (ref 6.5–8.1)

## 2020-08-04 LAB — CBC WITH DIFFERENTIAL/PLATELET
Abs Immature Granulocytes: 0.05 10*3/uL (ref 0.00–0.07)
Basophils Absolute: 0 10*3/uL (ref 0.0–0.1)
Basophils Relative: 0 %
Eosinophils Absolute: 0.2 10*3/uL (ref 0.0–0.5)
Eosinophils Relative: 2 %
HCT: 37.8 % — ABNORMAL LOW (ref 39.0–52.0)
Hemoglobin: 10.6 g/dL — ABNORMAL LOW (ref 13.0–17.0)
Immature Granulocytes: 1 %
Lymphocytes Relative: 11 %
Lymphs Abs: 0.8 10*3/uL (ref 0.7–4.0)
MCH: 24.7 pg — ABNORMAL LOW (ref 26.0–34.0)
MCHC: 28 g/dL — ABNORMAL LOW (ref 30.0–36.0)
MCV: 87.9 fL (ref 80.0–100.0)
Monocytes Absolute: 0.9 10*3/uL (ref 0.1–1.0)
Monocytes Relative: 12 %
Neutro Abs: 5.5 10*3/uL (ref 1.7–7.7)
Neutrophils Relative %: 74 %
Platelets: 211 10*3/uL (ref 150–400)
RBC: 4.3 MIL/uL (ref 4.22–5.81)
RDW: 16.5 % — ABNORMAL HIGH (ref 11.5–15.5)
WBC: 7.4 10*3/uL (ref 4.0–10.5)
nRBC: 0 % (ref 0.0–0.2)

## 2020-08-04 LAB — I-STAT ARTERIAL BLOOD GAS, ED
Acid-Base Excess: 16 mmol/L — ABNORMAL HIGH (ref 0.0–2.0)
Bicarbonate: 44.4 mmol/L — ABNORMAL HIGH (ref 20.0–28.0)
Calcium, Ion: 1.23 mmol/L (ref 1.15–1.40)
HCT: 35 % — ABNORMAL LOW (ref 39.0–52.0)
Hemoglobin: 11.9 g/dL — ABNORMAL LOW (ref 13.0–17.0)
O2 Saturation: 84 %
Potassium: 4 mmol/L (ref 3.5–5.1)
Sodium: 142 mmol/L (ref 135–145)
TCO2: 47 mmol/L — ABNORMAL HIGH (ref 22–32)
pCO2 arterial: 73.9 mmHg (ref 32.0–48.0)
pH, Arterial: 7.387 (ref 7.350–7.450)
pO2, Arterial: 52 mmHg — ABNORMAL LOW (ref 83.0–108.0)

## 2020-08-04 LAB — RESPIRATORY PANEL BY PCR

## 2020-08-04 LAB — RESPIRATORY PANEL BY RT PCR (FLU A&B, COVID)
Influenza A by PCR: NEGATIVE
Influenza B by PCR: NEGATIVE
SARS Coronavirus 2 by RT PCR: NEGATIVE

## 2020-08-04 LAB — BRAIN NATRIURETIC PEPTIDE: B Natriuretic Peptide: 28.4 pg/mL (ref 0.0–100.0)

## 2020-08-04 LAB — TROPONIN I (HIGH SENSITIVITY)
Troponin I (High Sensitivity): 9 ng/L (ref <18)
Troponin I (High Sensitivity): 10 ng/L (ref <18)

## 2020-08-04 LAB — I-STAT VENOUS BLOOD GAS, ED
Acid-Base Excess: 18 mmol/L — ABNORMAL HIGH (ref 0.0–2.0)
Bicarbonate: 46.6 mmol/L — ABNORMAL HIGH (ref 20.0–28.0)
Calcium, Ion: 1.19 mmol/L (ref 1.15–1.40)
HCT: 34 % — ABNORMAL LOW (ref 39.0–52.0)
Hemoglobin: 11.6 g/dL — ABNORMAL LOW (ref 13.0–17.0)
O2 Saturation: 54 %
Potassium: 3.8 mmol/L (ref 3.5–5.1)
Sodium: 141 mmol/L (ref 135–145)
TCO2: 49 mmol/L — ABNORMAL HIGH (ref 22–32)
pCO2, Ven: 80.4 mmHg (ref 44.0–60.0)
pH, Ven: 7.371 (ref 7.250–7.430)
pO2, Ven: 31 mmHg — CL (ref 32.0–45.0)

## 2020-08-04 LAB — PROCALCITONIN: Procalcitonin: 0.16 ng/mL

## 2020-08-04 LAB — STREP PNEUMONIAE URINARY ANTIGEN: Strep Pneumo Urinary Antigen: NEGATIVE

## 2020-08-04 LAB — POC SARS CORONAVIRUS 2 AG -  ED: SARS Coronavirus 2 Ag: NEGATIVE

## 2020-08-04 MED ORDER — METHYLPREDNISOLONE SODIUM SUCC 125 MG IJ SOLR
125.0000 mg | Freq: Once | INTRAMUSCULAR | Status: AC
  Administered 2020-08-04: 125 mg via INTRAVENOUS
  Filled 2020-08-04: qty 2

## 2020-08-04 MED ORDER — FUROSEMIDE 40 MG PO TABS
40.0000 mg | ORAL_TABLET | Freq: Two times a day (BID) | ORAL | Status: DC
  Administered 2020-08-04 – 2020-08-05 (×3): 40 mg via ORAL
  Filled 2020-08-04: qty 1
  Filled 2020-08-04 (×2): qty 2

## 2020-08-04 MED ORDER — ADULT MULTIVITAMIN W/MINERALS CH
1.0000 | ORAL_TABLET | Freq: Every day | ORAL | Status: DC
  Administered 2020-08-05 – 2020-08-07 (×3): 1 via ORAL
  Filled 2020-08-04 (×3): qty 1

## 2020-08-04 MED ORDER — POTASSIUM CHLORIDE ER 10 MEQ PO TBCR
10.0000 meq | EXTENDED_RELEASE_TABLET | Freq: Two times a day (BID) | ORAL | Status: DC
  Administered 2020-08-04 – 2020-08-05 (×3): 10 meq via ORAL
  Filled 2020-08-04 (×6): qty 1

## 2020-08-04 MED ORDER — LOSARTAN POTASSIUM 25 MG PO TABS
100.0000 mg | ORAL_TABLET | Freq: Every day | ORAL | Status: DC
  Administered 2020-08-05: 100 mg via ORAL
  Filled 2020-08-04 (×3): qty 4

## 2020-08-04 MED ORDER — APIXABAN 5 MG PO TABS
5.0000 mg | ORAL_TABLET | Freq: Two times a day (BID) | ORAL | Status: DC
  Administered 2020-08-04 – 2020-08-07 (×6): 5 mg via ORAL
  Filled 2020-08-04 (×6): qty 1

## 2020-08-04 MED ORDER — SODIUM CHLORIDE 3 % IN NEBU
4.0000 mL | INHALATION_SOLUTION | Freq: Every day | RESPIRATORY_TRACT | Status: DC | PRN
  Filled 2020-08-04: qty 4

## 2020-08-04 MED ORDER — LINACLOTIDE 145 MCG PO CAPS
145.0000 ug | ORAL_CAPSULE | Freq: Every day | ORAL | Status: DC
  Administered 2020-08-05 – 2020-08-07 (×3): 145 ug via ORAL
  Filled 2020-08-04 (×3): qty 1

## 2020-08-04 MED ORDER — HYDROCHLOROTHIAZIDE 25 MG PO TABS
25.0000 mg | ORAL_TABLET | Freq: Every day | ORAL | Status: DC
  Administered 2020-08-04 – 2020-08-05 (×2): 25 mg via ORAL
  Filled 2020-08-04 (×2): qty 1

## 2020-08-04 MED ORDER — MAGNESIUM SULFATE 2 GM/50ML IV SOLN
2.0000 g | Freq: Once | INTRAVENOUS | Status: AC
  Administered 2020-08-04: 2 g via INTRAVENOUS
  Filled 2020-08-04: qty 50

## 2020-08-04 MED ORDER — METHYLPREDNISOLONE SODIUM SUCC 125 MG IJ SOLR
60.0000 mg | Freq: Two times a day (BID) | INTRAMUSCULAR | Status: DC
  Administered 2020-08-05 – 2020-08-06 (×3): 60 mg via INTRAVENOUS
  Filled 2020-08-04 (×5): qty 2

## 2020-08-04 MED ORDER — OMEGA-3 FATTY ACIDS 1000 MG PO CAPS: 1.0000 g | ORAL_CAPSULE | Freq: Three times a day (TID) | ORAL | Status: DC

## 2020-08-04 MED ORDER — MAGNESIUM OXIDE 400 (241.3 MG) MG PO TABS
400.0000 mg | ORAL_TABLET | Freq: Every day | ORAL | Status: DC
  Administered 2020-08-04 – 2020-08-06 (×3): 400 mg via ORAL
  Filled 2020-08-04 (×3): qty 1

## 2020-08-04 MED ORDER — FUROSEMIDE 10 MG/ML IJ SOLN
40.0000 mg | Freq: Once | INTRAMUSCULAR | Status: AC
  Administered 2020-08-04: 40 mg via INTRAVENOUS
  Filled 2020-08-04: qty 4

## 2020-08-04 MED ORDER — REVEFENACIN 175 MCG/3ML IN SOLN
175.0000 ug | Freq: Every day | RESPIRATORY_TRACT | Status: DC
  Administered 2020-08-05 – 2020-08-07 (×3): 175 ug via RESPIRATORY_TRACT
  Filled 2020-08-04 (×4): qty 3

## 2020-08-04 MED ORDER — IPRATROPIUM-ALBUTEROL 0.5-2.5 (3) MG/3ML IN SOLN
3.0000 mL | Freq: Four times a day (QID) | RESPIRATORY_TRACT | Status: DC
  Administered 2020-08-04 – 2020-08-05 (×4): 3 mL via RESPIRATORY_TRACT
  Filled 2020-08-04 (×3): qty 3

## 2020-08-04 MED ORDER — TRAMADOL HCL 50 MG PO TABS: 50.0000 mg | ORAL_TABLET | Freq: Two times a day (BID) | ORAL | Status: DC | PRN

## 2020-08-04 MED ORDER — AMLODIPINE BESYLATE 10 MG PO TABS
10.0000 mg | ORAL_TABLET | Freq: Every day | ORAL | Status: DC
  Administered 2020-08-05 – 2020-08-07 (×3): 10 mg via ORAL
  Filled 2020-08-04: qty 1
  Filled 2020-08-04 (×2): qty 2

## 2020-08-04 MED ORDER — SODIUM CHLORIDE 0.9 % IV SOLN
500.0000 mg | Freq: Once | INTRAVENOUS | Status: AC
  Administered 2020-08-04: 500 mg via INTRAVENOUS
  Filled 2020-08-04 (×2): qty 500

## 2020-08-04 MED ORDER — ALLOPURINOL 300 MG PO TABS
300.0000 mg | ORAL_TABLET | Freq: Every day | ORAL | Status: DC
  Administered 2020-08-04 – 2020-08-05 (×2): 300 mg via ORAL
  Filled 2020-08-04 (×2): qty 3
  Filled 2020-08-04: qty 1

## 2020-08-04 MED ORDER — ALBUTEROL (5 MG/ML) CONTINUOUS INHALATION SOLN
10.0000 mg/h | INHALATION_SOLUTION | Freq: Once | RESPIRATORY_TRACT | Status: AC
  Administered 2020-08-04: 10 mg/h via RESPIRATORY_TRACT
  Filled 2020-08-04: qty 20

## 2020-08-04 MED ORDER — AZELASTINE HCL 0.1 % NA SOLN
1.0000 | Freq: Two times a day (BID) | NASAL | Status: DC
  Administered 2020-08-05 – 2020-08-07 (×3): 1 via NASAL
  Filled 2020-08-04: qty 30

## 2020-08-04 MED ORDER — TEMAZEPAM 15 MG PO CAPS: 15.0000 mg | ORAL_CAPSULE | Freq: Every evening | ORAL | Status: DC | PRN

## 2020-08-04 MED ORDER — NITROGLYCERIN 0.4 MG SL SUBL: 0.4000 mg | SUBLINGUAL_TABLET | SUBLINGUAL | Status: DC | PRN

## 2020-08-04 MED ORDER — GUAIFENESIN ER 600 MG PO TB12
1200.0000 mg | ORAL_TABLET | Freq: Two times a day (BID) | ORAL | Status: DC
  Administered 2020-08-04 – 2020-08-07 (×6): 1200 mg via ORAL
  Filled 2020-08-04 (×7): qty 2

## 2020-08-04 MED ORDER — ATORVASTATIN CALCIUM 10 MG PO TABS
20.0000 mg | ORAL_TABLET | Freq: Every day | ORAL | Status: DC
  Administered 2020-08-05 – 2020-08-07 (×3): 20 mg via ORAL
  Filled 2020-08-04 (×3): qty 2

## 2020-08-04 MED ORDER — ROFLUMILAST 500 MCG PO TABS
500.0000 ug | ORAL_TABLET | Freq: Every morning | ORAL | Status: DC
  Administered 2020-08-05 – 2020-08-07 (×3): 500 ug via ORAL
  Filled 2020-08-04 (×3): qty 1

## 2020-08-04 MED ORDER — IPRATROPIUM BROMIDE 0.02 % IN SOLN
0.5000 mg | Freq: Once | RESPIRATORY_TRACT | Status: AC
  Administered 2020-08-04: 0.5 mg via RESPIRATORY_TRACT
  Filled 2020-08-04: qty 2.5

## 2020-08-04 MED ORDER — FLECAINIDE ACETATE 100 MG PO TABS
100.0000 mg | ORAL_TABLET | Freq: Two times a day (BID) | ORAL | Status: DC
  Administered 2020-08-04 – 2020-08-07 (×6): 100 mg via ORAL
  Filled 2020-08-04 (×7): qty 1

## 2020-08-04 MED ORDER — METOPROLOL SUCCINATE ER 50 MG PO TB24
50.0000 mg | ORAL_TABLET | Freq: Every day | ORAL | Status: DC
  Administered 2020-08-05 – 2020-08-07 (×3): 50 mg via ORAL
  Filled 2020-08-04: qty 1
  Filled 2020-08-04: qty 2
  Filled 2020-08-04: qty 1

## 2020-08-04 MED ORDER — ARFORMOTEROL TARTRATE 15 MCG/2ML IN NEBU
15.0000 ug | INHALATION_SOLUTION | Freq: Two times a day (BID) | RESPIRATORY_TRACT | Status: DC
  Administered 2020-08-04 – 2020-08-07 (×5): 15 ug via RESPIRATORY_TRACT
  Filled 2020-08-04 (×6): qty 2

## 2020-08-04 MED ORDER — MONTELUKAST SODIUM 10 MG PO TABS
10.0000 mg | ORAL_TABLET | Freq: Every day | ORAL | Status: DC
  Administered 2020-08-04 – 2020-08-06 (×3): 10 mg via ORAL
  Filled 2020-08-04 (×4): qty 1

## 2020-08-04 MED ORDER — ALPRAZOLAM 0.5 MG PO TABS
0.5000 mg | ORAL_TABLET | Freq: Every day | ORAL | Status: DC
  Administered 2020-08-04 – 2020-08-06 (×3): 0.5 mg via ORAL
  Filled 2020-08-04 (×2): qty 1
  Filled 2020-08-04: qty 2

## 2020-08-04 MED ORDER — VITAMIN D 25 MCG (1000 UNIT) PO TABS
1000.0000 [IU] | ORAL_TABLET | Freq: Every day | ORAL | Status: DC
  Administered 2020-08-04 – 2020-08-06 (×3): 1000 [IU] via ORAL
  Filled 2020-08-04 (×3): qty 1

## 2020-08-04 MED ORDER — ALPRAZOLAM 0.25 MG PO TABS
0.2500 mg | ORAL_TABLET | Freq: Two times a day (BID) | ORAL | Status: DC | PRN
  Administered 2020-08-04: 0.25 mg via ORAL
  Filled 2020-08-04: qty 1

## 2020-08-04 MED ORDER — SODIUM CHLORIDE 0.9 % IV SOLN
1.0000 g | Freq: Once | INTRAVENOUS | Status: AC
  Administered 2020-08-04: 1 g via INTRAVENOUS
  Filled 2020-08-04: qty 10

## 2020-08-04 MED ORDER — ASCORBIC ACID 500 MG PO TABS
500.0000 mg | ORAL_TABLET | Freq: Every day | ORAL | Status: DC
  Administered 2020-08-05 – 2020-08-07 (×3): 500 mg via ORAL
  Filled 2020-08-04 (×3): qty 1

## 2020-08-04 MED ORDER — BUDESONIDE 0.25 MG/2ML IN SUSP
0.2500 mg | Freq: Two times a day (BID) | RESPIRATORY_TRACT | Status: DC
  Administered 2020-08-04 – 2020-08-07 (×6): 0.25 mg via RESPIRATORY_TRACT
  Filled 2020-08-04 (×6): qty 2

## 2020-08-04 MED ORDER — IPRATROPIUM-ALBUTEROL 0.5-2.5 (3) MG/3ML IN SOLN
3.0000 mL | Freq: Four times a day (QID) | RESPIRATORY_TRACT | Status: DC | PRN
  Administered 2020-08-06 – 2020-08-07 (×2): 3 mL via RESPIRATORY_TRACT
  Filled 2020-08-04 (×2): qty 3

## 2020-08-04 MED ORDER — ROPINIROLE HCL 1 MG PO TABS
0.5000 mg | ORAL_TABLET | Freq: Every day | ORAL | Status: DC
  Administered 2020-08-04 – 2020-08-06 (×3): 0.5 mg via ORAL
  Filled 2020-08-04 (×3): qty 1

## 2020-08-04 NOTE — ED Notes (Signed)
Pt unable to ambulate at this time due to pt requiring increased oxygen supply.

## 2020-08-04 NOTE — ED Notes (Signed)
Called [facility] to inform them of neg rapid covid. She will come administer continuous neb and regular neb simultaneously.

## 2020-08-04 NOTE — ED Triage Notes (Signed)
Pt from home with increased SOB x 1 week. Uses 6-7 lpm with sats of 85-95% normally. [place] was 75% then today 68% on 9lpm, placed on NRB satting 88-90%. Denies fever or pain. Pt reports being vaxxed.

## 2020-08-04 NOTE — ED Notes (Signed)
Called [facility] to report istat and need for bipap per Dr Ralene Bathe.

## 2020-08-04 NOTE — ED Notes (Signed)
Admitting at bedside 

## 2020-08-04 NOTE — Consult Note (Addendum)
NAME:  Harold Hall, MRN:  [id number], DOB:  Mar 29, 1946, LOS: 0 ADMISSION DATE:  08/04/2020, CONSULTATION DATE:  9/26 REFERRING MD:  Dr Ralene Bathe of ER, CHIEF COMPLAINT:  Worsening hypoxemia in known copd patient   Brief History   See below  History of present illness   74 year old male with known -chronic diastolic dysfunction but negative cardiac stress test June 2017.  Associated obesity, proximal atrial fibrillation, status post left nephrectomy for renal cell carcinoma.  Gold stage III COPD on PFTs but hypercapnic on blood gas and out of proportion hypoxemia.  CT chest in March 2021 showing mostly emphysema with some upper lobe densities.  Echocardiogram last in September 2020 unclear if he has any patent foramen ovale.  Follows with Dr. Leory Plowman Icard in the pulmonary clinic.  Last seen by Wyn Quaker nurse practitioner July 03, 2020.  Based on his history and chart review it appears several changes were initiated.  3 weeks ago his baseline inhaler was changed to triple inhaler regimen called BREZTRI [improved outcomes with exacerbation ).  In 2 weeks ago was started on trilogy nocturnal ventilation.  He says that approximately a week ago he started noticing increased constipation.  At baseline he uses 7 L of oxygen and his baseline pulse ox is between 85 and 95%.  After noticing increased constipation his pulse ox was between 83% and 90%.  Then he seemed to get better with the constipation and correlating with this pulse ox also start getting better.  However 4 days prior to admission started noticing pulse ox 65-70.  His effort tolerance has dropped.  Normally can walk 30 feet before stopping to rest.  Now he is able to walk only 10 feet.  Denies any flulike symptoms.  His Covid PCR and flu are negative.  He is worried about getting hospitalized because he is worried about contagion inside the hospital with Covid.  After some reassurance about the risk for this he is agreed for admission.  At this  point in time he is talking full sentences.  He denies any fever or chills.  His blood gas on arrival shows a PCO2 of 80 which might be higher than his baseline which appears to be around 60.  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  Only Dr. Lorin Picket or Dr. Salvatore Marvel or Dr. Vinnie Langton or Dr. Alois Cliche to read to be read only by thoracic radiology  He has a travel history to [facility] over a week ago.  However he says he is good social distancing.  He denies any other respiratory virus symptoms.  He is on chronic anticoagulation with Eliquis His baseline COPD regimen includes oxygen as above, BREZTRI, azithromycin Monday Wednesday Friday, Singulair and Roflumilast.  The latter 2 being for prevention of COPD exacerbations.  He has a previous 75 pack-year smoking history   Blood: 09/2016 IgE 4 08/15/2018 Hgb 16.9 mg/dL  Echo: 08/15/2018 LVEF > 55%, mod LVH, dilated left atrial, normal RV, no shunt 08/07/2019-echocardiogram-LV ejection fraction 55 to 66%, grade 1 diastolic dysfunction,   PFT Results Latest Ref Rng & Units 04/22/2016  FVC-Pre L 3.58  FVC-Predicted Pre % 69  FVC-Post L 3.78  FVC-Predicted Post % 73  Pre FEV1/FVC % % 49  Post FEV1/FCV % % 47  FEV1-Pre L 1.77  FEV1-Predicted Pre % 46  FEV1-Post L 1.76  DLCO uncorrected ml/min/mmHg 13.18  DLCO UNC% % 34  DLCO corrected ml/min/mmHg 12.67  DLCO COR %Predicted %  33  DLVA Predicted % 48  TLC L 6.45  TLC % Predicted % 82  RV % Predicted % 93    Results for TORREZ, RENFROE (MRN [id number]) as of 08/04/2020 17:17  Ref. Range 05/30/2020 22:55 08/04/2020 14:37 08/04/2020 15:58  pH, Arterial Latest Ref Range: 7.35 - 7.45  7.430  7.387  pCO2 arterial Latest Ref Range: 32 - 48 mmHg 60.7 (H)  73.9 (HH)  pO2, Arterial Latest Ref Range: 83 - 108 mmHg 48 (L)  52 (L)  pH, Ven Latest Ref Range: 7.25 - 7.43   7.371   pCO2, Ven Latest Ref Range: 44 - 60 mmHg  80.4 (HH)     Past Medical History  .   has  a past medical history of Allergic rhinitis, Arthritis, Cancer ([place]), COPD (chronic obstructive pulmonary disease) ([place]), Erectile dysfunction, Gout, Hernia, History of kidney cancer, Hypercholesterolemia, Hypertension, RLS (restless legs syndrome), and Sinus problem.   reports that he quit smoking about 9 years ago. His smoking use included cigarettes. He has a 100.00 pack-year smoking history. He has never used smokeless tobacco.  Past Surgical History:  Procedure Laterality Date  . CARDIAC CATHETERIZATION    . HERNIA REPAIR  06/04/11  . left kidney removed  1994    No Known Allergies  Immunization History  Administered Date(s) Administered  . Fluad Quad(high Dose 65+) 08/02/2019  . Influenza Split 08/13/2011, 08/21/2012, 08/17/2014, 08/24/2015  . Influenza Whole 08/12/2009, 08/09/2010  . Influenza, High Dose Seasonal PF 08/09/2013, 08/13/2016, 09/05/2017, 07/08/2018  . PFIZER SARS-COV-2 Vaccination 01/18/2020, 02/15/2020  . Pneumococcal Conjugate-13 08/17/2014  . Pneumococcal Polysaccharide-23 08/10/2007    Family History  Problem Relation Age of Onset  . COPD Mother   . Hypertension Mother   . Osteoporosis Mother   . Prostate cancer Father   . Cancer Father        prostate     Current Facility-Administered Medications:  .  azithromycin (ZITHROMAX) 500 mg in sodium chloride 0.9 % 250 mL IVPB, 500 mg, Intravenous, Once, Quintella Reichert, MD .  cefTRIAXone (ROCEPHIN) 1 g in sodium chloride 0.9 % 100 mL IVPB, 1 g, Intravenous, Once, Quintella Reichert, MD, Last Rate: 200 mL/hr at 08/04/20 1706, 1 g at 08/04/20 1706 .  magnesium sulfate IVPB 2 g 50 mL, 2 g, Intravenous, Once, Brand Males, MD  Current Outpatient Medications:  .  albuterol (PROAIR HFA) 108 (90 Base) MCG/ACT inhaler, INHALE 2 PUFFS BY MOUTH INTO THE LUNGS EVERY 4 HOURS AS NEEDED FOR WHEEZING OR SHORTNESS OF BREATH, Disp: 3 Inhaler, Rfl: 3 .  allopurinol (ZYLOPRIM) 300 MG tablet, Take 300 mg by mouth daily.,  Disp: , Rfl:  .  ALPRAZolam (XANAX) 0.5 MG tablet, Take 1 tablet (0.5 mg total) by mouth 2 (two) times daily. (Patient taking differently: Take 0.5 mg by mouth at bedtime. ), Disp: 60 tablet, Rfl: 0 .  amLODipine (NORVASC) 10 MG tablet, Take 10 mg by mouth daily. , Disp: , Rfl:  .  atorvastatin (LIPITOR) 20 MG tablet, Take 20 mg by mouth daily., Disp: , Rfl:  .  azelastine (ASTELIN) 0.1 % nasal spray, Place 1 spray into both nostrils 2 (two) times daily. Once daily, Disp: , Rfl: 2 .  azithromycin (ZITHROMAX) 250 MG tablet, Take Monday, Wednesday, Friday only with food., Disp: 39 tablet, Rfl: 2 .  Budeson-Glycopyrrol-Formoterol (BREZTRI AEROSPHERE) 160-9-4.8 MCG/ACT AERO, Inhale 2 puffs into the lungs in the morning and at bedtime., Disp: 32.1 g, Rfl: 2 .  budesonide-formoterol (SYMBICORT) 160-4.5  MCG/ACT inhaler, Inhale 2 puffs into the lungs 2 (two) times daily., Disp: 3 Inhaler, Rfl: 3 .  DALIRESP 500 MCG TABS tablet, TAKE 1 TABLET EVERY DAY, Disp: 90 tablet, Rfl: 3 .  ELIQUIS 5 MG TABS tablet, TAKE 1 TABLET TWO TIMES DAILY, Disp: 180 tablet, Rfl: 3 .  fish oil-omega-3 fatty acids 1000 MG capsule, Take 1 capsule by mouth 3 (three) times daily.  , Disp: , Rfl:  .  flecainide (TAMBOCOR) 100 MG tablet, TAKE 1  TABLET TWO TIMES DAILY, Disp: 180 tablet, Rfl: 1 .  furosemide (LASIX) 40 MG tablet, TAKE 1 TABLET TWICE DAILY IN THE MORNING AND AT 3 PM AS NEEDED FOR FLUID, Disp: 180 tablet, Rfl: 1 .  ipratropium-albuterol (DUONEB) 0.5-2.5 (3) MG/3ML SOLN, Use every 6-8 hours as needed for wheezing and SOB., Disp: 360 mL, Rfl: 12 .  Linaclotide (LINZESS) 145 MCG CAPS capsule, Take 145 mcg by mouth every other day. , Disp: , Rfl:  .  losartan-hydrochlorothiazide (HYZAAR) 100-25 MG tablet, 1 tablet daily. , Disp: , Rfl:  .  metoprolol succinate (TOPROL-XL) 50 MG 24 hr tablet, Take 50 mg by mouth daily. Take with or immediately following a meal., Disp: , Rfl:  .  montelukast (SINGULAIR) 10 MG tablet, TAKE 1  TABLET AT BEDTIME, Disp: 90 tablet, Rfl: 2 .  Multiple Vitamin (MULITIVITAMIN WITH MINERALS) TABS, Take 1 tablet by mouth daily., Disp: , Rfl:  .  nitroGLYCERIN (NITROSTAT) 0.4 MG SL tablet, Place 0.4 mg under the tongue every 5 (five) minutes as needed for chest pain., Disp: , Rfl:  .  nystatin (MYCOSTATIN) 100000 UNIT/ML suspension, Take 5 mLs (500,000 Units total) by mouth 4 (four) times daily., Disp: 60 mL, Rfl: 0 .  OXYGEN, Inhale 6 L into the lungs. 8L if exerting, Disp: , Rfl:  .  potassium chloride (KLOR-CON) 10 MEQ tablet, TAKE 1 TABLET TWO TIMES DAILY AS NEEDED WITH FUROSEMIDE, Disp: 180 tablet, Rfl: 3 .  predniSONE (DELTASONE) 10 MG tablet, 4 tabs for 3 days, then 3 tabs for 3 days, 2 tabs for 3 days, then 1 tab for 3 days, then stop, Disp: 30 tablet, Rfl: 0 .  Respiratory Therapy Supplies (FLUTTER) DEVI, Use as directed, Disp: 1 each, Rfl: 0 .  rOPINIRole (REQUIP) 0.5 MG tablet, Take 1 tablet (0.5 mg total) by mouth daily., Disp: 30 tablet, Rfl: 0 .  sodium chloride HYPERTONIC 3 % nebulizer solution, Take by nebulization as needed for other., Disp: 750 mL, Rfl: 12 .  temazepam (RESTORIL) 15 MG capsule, Take 15 mg by mouth at bedtime as needed for sleep. , Disp: , Rfl:  .  Tiotropium Bromide Monohydrate (SPIRIVA RESPIMAT) 2.5 MCG/ACT AERS, Inhale 2 puffs into the lungs daily., Disp: 12 g, Rfl: 1 .  traMADol (ULTRAM) 50 MG tablet, TAKE 1 TABLET BY MOUTH THREE TIMES DAILY AS NEEDED FOR MODERATE PAIN, Disp: 90 tablet, Rfl: 0 .  vitamin C (ASCORBIC ACID) 500 MG tablet, Take 500 mg by mouth daily., Disp: , Rfl:    Significant Hospital Events   08/04/2020: Admit Consults:  08/04/2020: Pulmonary consult  Procedures:  X  Significant Diagnostic Tests:  X  Micro Data:  Respiratory flu panel and also Covid 08/04/2020: Negative RVP 9/26 Urine leg 9/26 Urine strep 9/26  Antimicrobials:  Empiric ceftriaxone and azithromycin in the ED  Interim history/subjective:  08/04/2020: Evaluated  in the emergency room bed 3 at [facility]  Objective   Blood pressure (!) 150/111, pulse 91, temperature 98.6 F (  37 C), temperature source Oral, resp. rate 18, height 6\' 2"  (1.88 m), weight (!) 138.3 kg, SpO2 90 %.       No intake or output data in the 24 hours ending 08/04/20 1725 Filed Weights   08/04/20 1349  Weight: (!) 138.3 kg    Examination: General: obese, talking comfortably in ER stretcher HENT: Mallampati class 4. No thrush Lungs: Distant. Some wheeze. Mildly tachypneic Cardiovascular: Tachy. No murmur Abdomen: obese Extremities: mild chronic edema. ? RLE warm Neuro: AxOx3 GU: not examinex  Resolved Hospital Problem list   x  Assessment & Plan:  #Baseline  Advanced COPD with out of proportion hypoxemia -requiring 7 L nasal cannula to keep baseline pulse ox 85-95% Associated cor pulmonale on echo Unclear if this coexistent ILD [at least absent in March 2021 CT]  #Current Acute on chronic hypoxemic and hypercapnic respiratory failure -with decline in 1 week prior to admission   -Likely acute exacerbation COPD -?  Due to change in baseline inhaler regimen [unlikely] versus recent constipation [affecting diaphragm movement and quite likely], other respiratory viruses or infection [need to be ruled out, DVT [on Eliquis but right lower extremities?  Warm], worsening cor pulmonale, ?  Was using his trilogy ventilator currently  Plan -Start inpatient nebulizer with long-acting beta agonist and long-acting anticholinergic -Start IV steroids -Empiric magnesium 2 g sulfate -Start BiPAP [he says he has intolerance to it -we will do on and off in the daytime and at least 4 hours at night]  -Needs blood gas prior to dc home   -Check alpha-1 antitrypsin phenotype -Rule out any interstitial lung disease changes -get high-resolution CT chest -Get repeat echo - check duplex LE   -Check respiratory virus panel, urine strep and urine Legionella and procalcitonin  - low  threshold to dc abx   - Admit under [facility] MD - d/w Dr Evelene Croon of ER - Progressive Care Best practice:  Per triad MD      ATTESTATION & SIGNATURE   The patient ADRIANE GUGLIELMO is critically ill with multiple organ systems failure and requires high complexity decision making for assessment and support, frequent evaluation and titration of therapies, application of advanced monitoring technologies and extensive interpretation of multiple databases.   Critical Care Time devoted to patient care services described in this note is  60  Minutes. This time reflects time of care of this signee Dr Brand Males. This critical care time does not reflect procedure time, or teaching time or supervisory time of PA/NP/Med student/Med Resident etc but could involve care discussion time     Dr. Brand Males, M.D., [facility].C.P Pulmonary and Critical Care Medicine Staff Physician [place] [facility] Pager: [phone], If no answer or between  15:00h - 7:00h: call [phone]  08/04/2020 5:25 PM     LABS    PULMONARY Recent Labs  Lab 08/04/20 1437 08/04/20 1558  PHART  --  7.387  PCO2ART  --  73.9*  PO2ART  --  52*  HCO3 46.6* 44.4*  TCO2 49* 47*  O2SAT 54.0 84.0    CBC Recent Labs  Lab 08/04/20 1344 08/04/20 1437 08/04/20 1558  HGB 10.6* 11.6* 11.9*  HCT 37.8* 34.0* 35.0*  WBC 7.4  --   --   PLT 211  --   --     COAGULATION No results for input(s): INR in the last 168 hours.  CARDIAC  No results for input(s): TROPONINI in the  last 168 hours. No results for input(s): PROBNP in the last 168 hours.   CHEMISTRY Recent Labs  Lab 08/04/20 1344 08/04/20 1344 08/04/20 1437 08/04/20 1558  NA 141  --  141 142  K 3.5   < > 3.8 4.0  CL 90*  --   --   --   CO2 41*  --   --   --   GLUCOSE 214*  --   --   --   BUN 29*  --   --   --   CREATININE 1.66*  --   --   --   CALCIUM 9.2  --   --   --    < > = values in this interval not  displayed.   Estimated Creatinine Clearance: 58.6 mL/min (A) (by C-G formula based on SCr of 1.66 mg/dL (H)).   LIVER Recent Labs  Lab 08/04/20 1344  AST 17  ALT 18  ALKPHOS 70  BILITOT 0.9  PROT 6.5  ALBUMIN 3.2*     INFECTIOUS No results for input(s): LATICACIDVEN, PROCALCITON in the last 168 hours.   ENDOCRINE CBG (last 3)  No results for input(s): GLUCAP in the last 72 hours.       IMAGING x48h  - image(s) personally visualized  -   highlighted in bold DG Chest Port 1 View  Result Date: 08/04/2020 CLINICAL DATA:  Shortness of breath. EXAM: PORTABLE CHEST 1 VIEW COMPARISON:  May 30, 2020 FINDINGS: Cardiomediastinal silhouette is normal. Mediastinal contours appear intact. There is no evidence of lobar consolidation. Advanced emphysema. Chronic interstitial coarsening, more prominent in the lower lobes. Osseous structures are without acute abnormality. Soft tissues are grossly normal. IMPRESSION: 1. Advanced emphysema and chronic interstitial lung changes. Superimposed interstitial pulmonary edema or early consolidation is difficult to exclude. 2. No evidence of lobar consolidation. Electronically Signed   By: Fidela Salisbury M.D.   On: 08/04/2020 14:40

## 2020-08-04 NOTE — Progress Notes (Signed)
BIPAP set up at bedside for pt to wear QHS. Pt and wife state that at home, he only wears BIPAP throughout the day, is unable to tolerate at night. Pt states that he might be able to wear BIPAP an hour at a time. Pt currently on 15L HFNC. RT will check back later to place pt on BIPAP.

## 2020-08-04 NOTE — ED Provider Notes (Signed)
[facility] EMERGENCY DEPARTMENT Provider Note   CSN: [id number] Arrival date & time: 08/04/20  1336     History Chief Complaint  Patient presents with  . Shortness of Breath    Harold Hall is a 74 y.o. male.  The history is provided by the patient, medical records and the EMS personnel. No language interpreter was used.  Shortness of Breath  Harold Hall is a 74 y.o. male who presents to the Emergency Department complaining of shortness of breath. He presents the emergency department by EMS for evaluation of acute on chronic shortness of breath. He has a history of COPD and is on 6 to 8 L nasal cannula oxygen at baseline. Over the last week he is experienced increased shortness of breath and dyspnea on exertion. He is noted that his oxygen levels are dropping below baseline. His normal is 85 to 95% but over the last few days he has been dropping to the 70s at times. On EMS arrival he was setting 68% on 9 L nasal cannula. He denies any fevers, chest pain, nausea, vomiting. No known sick contacts. He has been vaccinated for COVID-19.    Past Medical History:  Diagnosis Date  . Allergic rhinitis   . Arthritis   . Cancer [facility])    left renal -solitary kidney  . COPD (chronic obstructive pulmonary disease) ([place])   . Erectile dysfunction   . Gout   . Hernia   . History of kidney cancer   . Hypercholesterolemia   . Hypertension   . RLS (restless legs syndrome)   . Sinus problem     Patient Active Problem List   Diagnosis Date Noted  . Diastolic CHF ([place]) 53/97/6734  . Thrush, oral 06/13/2019  . Medication management 06/13/2019  . Therapeutic drug monitoring 06/13/2019  . Edema 04/26/2018  . Atrial fibrillation ([place]) 04/21/2016  . Dyspnea 04/21/2016  . Chronic respiratory failure with hypoxia ([place]) 10/24/2015  . COPD exacerbation ([place]) 10/22/2011  . Pulmonary nodule 05/19/2011  . Abdominal pain, left upper quadrant at site of previous  nephrectomy 1994 05/06/2011  . Umbilical hernia 19/37/9024  . Inguinal hernia bilateral, non-recurrent 05/06/2011  . RESTLESS LEGS SYNDROME 10/06/2010  . 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 CARDIAC CATHETERIZATION    . HERNIA REPAIR  06/04/11  . left kidney removed  1994       Family History  Problem Relation Age of Onset  . COPD Mother   . Hypertension Mother   . Osteoporosis Mother   . Prostate cancer Father   . Cancer Father        prostate    Social History   Tobacco Use  . Smoking status: Former Smoker    Packs/day: 2.00    Years: 50.00    Pack years: 100.00    Types: Cigarettes    Quit date: 06/04/2011    Years since quitting: 9.1  . Smokeless tobacco: Never Used  Vaping Use  . Vaping Use: Never used  Substance Use Topics  . Alcohol use: Yes    Alcohol/week: 2.0 - 3.0 standard drinks    Types: 2 - 3 Cans of beer per week    Comment: beers  . Drug use: No    Home Medications Prior to Admission medications   Medication Sig Start Date End Date Taking? Authorizing Provider  albuterol (PROAIR HFA) 108 (90 Base) MCG/ACT inhaler INHALE  2 PUFFS BY MOUTH INTO THE LUNGS EVERY 4 HOURS AS NEEDED FOR WHEEZING OR SHORTNESS OF BREATH 12/14/18   Juanito Doom, MD  allopurinol (ZYLOPRIM) 300 MG tablet Take 300 mg by mouth daily.    [provider]  ALPRAZolam Duanne Moron) 0.5 MG tablet Take 1 tablet (0.5 mg total) by mouth 2 (two) times daily. Patient taking differently: Take 0.5 mg by mouth at bedtime.  08/24/14   Kathee Delton, MD  amLODipine (NORVASC) 10 MG tablet Take 10 mg by mouth daily.     [provider]  atorvastatin (LIPITOR) 20 MG tablet Take 20 mg by mouth daily.    [provider]  azelastine (ASTELIN) 0.1 % nasal spray Place 1 spray into both nostrils 2 (two) times daily. Once daily 03/20/15   [provider]  azithromycin  (ZITHROMAX) 250 MG tablet Take Monday, Wednesday, Friday only with food. 11/29/19   Lauraine Rinne, NP  Budeson-Glycopyrrol-Formoterol (BREZTRI AEROSPHERE) 160-9-4.8 MCG/ACT AERO Inhale 2 puffs into the lungs in the morning and at bedtime. 04/15/20   Lauraine Rinne, NP  budesonide-formoterol (SYMBICORT) 160-4.5 MCG/ACT inhaler Inhale 2 puffs into the lungs 2 (two) times daily. 11/14/19   Lauraine Rinne, NP  DALIRESP 500 MCG TABS tablet TAKE 1 TABLET EVERY DAY 10/20/19   Martyn Ehrich, NP  ELIQUIS 5 MG TABS tablet TAKE 1 TABLET TWO TIMES DAILY 03/05/20   Adrian Prows, MD  fish oil-omega-3 fatty acids 1000 MG capsule Take 1 capsule by mouth 3 (three) times daily.      [provider]  flecainide (TAMBOCOR) 100 MG tablet TAKE 1  TABLET TWO TIMES DAILY 06/12/20   Adrian Prows, MD  furosemide (LASIX) 40 MG tablet TAKE 1 TABLET TWICE DAILY IN THE MORNING AND AT 3 PM AS NEEDED FOR FLUID 04/11/20   Adrian Prows, MD  ipratropium-albuterol (DUONEB) 0.5-2.5 (3) MG/3ML SOLN Use every 6-8 hours as needed for wheezing and SOB. 06/06/20   Lauraine Rinne, NP  Linaclotide (LINZESS) 145 MCG CAPS capsule Take 145 mcg by mouth every other day.     [provider]  losartan-hydrochlorothiazide (HYZAAR) 100-25 MG tablet 1 tablet daily.  05/16/18   [provider]  metoprolol succinate (TOPROL-XL) 50 MG 24 hr tablet Take 50 mg by mouth daily. Take with or immediately following a meal.    [provider]  montelukast (SINGULAIR) 10 MG tablet TAKE 1 TABLET AT BEDTIME 04/11/20   Lauraine Rinne, NP  Multiple Vitamin (MULITIVITAMIN WITH MINERALS) TABS Take 1 tablet by mouth daily.    [provider]  nitroGLYCERIN (NITROSTAT) 0.4 MG SL tablet Place 0.4 mg under the tongue every 5 (five) minutes as needed for chest pain.    [provider]  nystatin (MYCOSTATIN) 100000 UNIT/ML suspension Take 5 mLs (500,000 Units total) by mouth 4 (four) times daily. 03/27/20   Lauraine Rinne, NP  OXYGEN Inhale 6 L  into the lungs. 8L if exerting    [provider]  potassium chloride (KLOR-CON) 10 MEQ tablet TAKE 1 TABLET TWO TIMES DAILY AS NEEDED WITH FUROSEMIDE 06/12/20   Adrian Prows, MD  predniSONE (DELTASONE) 10 MG tablet 4 tabs for 3 days, then 3 tabs for 3 days, 2 tabs for 3 days, then 1 tab for 3 days, then stop 07/08/20   Lauraine Rinne, NP  Respiratory Therapy Supplies (FLUTTER) DEVI Use as directed 05/10/18   Lauraine Rinne, NP  rOPINIRole (REQUIP) 0.5 MG tablet Take  1 tablet (0.5 mg total) by mouth daily. 04/05/13   ClanceArmando Reichert, MD  sodium chloride HYPERTONIC 3 % nebulizer solution Take by nebulization as needed for other. 06/06/20   Lauraine Rinne, NP  temazepam (RESTORIL) 15 MG capsule Take 15 mg by mouth at bedtime as needed for sleep.     [provider]  Tiotropium Bromide Monohydrate (SPIRIVA RESPIMAT) 2.5 MCG/ACT AERS Inhale 2 puffs into the lungs daily. 12/15/19   Lauraine Rinne, NP  traMADol (ULTRAM) 50 MG tablet TAKE 1 TABLET BY MOUTH THREE TIMES DAILY AS NEEDED FOR MODERATE PAIN 01/26/20   Adrian Prows, MD  vitamin C (ASCORBIC ACID) 500 MG tablet Take 500 mg by mouth daily.    [provider]    Allergies    Patient has no known allergies.  Review of Systems   Review of Systems  Respiratory: Positive for shortness of breath.   All other systems reviewed and are negative.   Physical Exam Updated Vital Signs There were no vitals taken for this visit.  Physical Exam Vitals and nursing note reviewed.  Constitutional:      Appearance: He is well-developed.  HENT:     Head: Normocephalic and atraumatic.  Cardiovascular:     Rate and Rhythm: Normal rate and regular rhythm.     Heart sounds: No murmur heard.   Pulmonary:     Effort: Respiratory distress present.     Comments: Tachypnea. Decreased air movement in all lung fields Abdominal:     General: There is distension.     Tenderness: There is no abdominal tenderness. There is no guarding or rebound.    Musculoskeletal:        General: No tenderness.     Comments: One plus pitting edema to bilateral lower extremities  Skin:    General: Skin is warm and dry.  Neurological:     Mental Status: He is alert and oriented to person, place, and time.  Psychiatric:        Behavior: Behavior normal.     ED Results / Procedures / Treatments   Labs (all labs ordered are listed, but only abnormal results are displayed) Labs Reviewed  COMPREHENSIVE METABOLIC PANEL  BRAIN NATRIURETIC PEPTIDE  CBC WITH DIFFERENTIAL/PLATELET  I-STAT VENOUS BLOOD GAS, ED  POC SARS CORONAVIRUS 2 AG -  ED  TROPONIN I (HIGH SENSITIVITY)    EKG None  Radiology No results found.  Procedures Procedures (including critical care time) CRITICAL CARE Performed by: Quintella Reichert   Total critical care time: 35 minutes  Critical care time was exclusive of separately billable procedures and treating other patients.  Critical care was necessary to treat or prevent imminent or life-threatening deterioration.  Critical care was time spent personally by me on the following activities: development of treatment plan with patient and/or surrogate as well as nursing, discussions with consultants, evaluation of patient's response to treatment, examination of patient, obtaining history from patient or surrogate, ordering and performing treatments and interventions, ordering and review of laboratory studies, ordering and review of radiographic studies, pulse oximetry and re-evaluation of patient's condition.  Medications Ordered in ED Medications  methylPREDNISolone sodium succinate (SOLU-MEDROL) 125 mg/2 mL injection 125 mg (has no administration in time range)  albuterol (PROVENTIL,VENTOLIN) solution continuous neb (has no administration in time range)  ipratropium (ATROVENT) nebulizer solution 0.5 mg (has no administration in time range)    ED Course  I have reviewed the triage vital signs and the nursing  notes.  Pertinent labs & imaging results that were available during my care of the patient were reviewed by me and considered in my medical decision making (see chart for details).    MDM Rules/Calculators/A&P                          Pt with hx/o oxygen dependent COPD on 6-7 liters at baseline here with increased work of breathing and increased oxygen requirement. He was treated with supplemental oxygen, steroids, continuous nebs. He does have persistent increased oxygen requirement. Given complexity of illness critical care consulted. He was treated with antibiotics for possible pneumonia, Lasix for possible volume overload. Critical care evaluated the patient, plan to admit to medicine service in the step down unit. Medicine consulted for admission. Final Clinical Impression(s) / ED Diagnoses Final diagnoses:  COPD exacerbation [facility])    Rx / DC Orders ED Discharge Orders    None       Quintella Reichert, MD 08/04/20 1839

## 2020-08-04 NOTE — H&P (Signed)
History and Physical    LYNNE RIGHI HKV:[id number] DOB: [birth year] DOA: 08/04/2020  PCP: Alroy Dust, L.[name], MD (Confirm with patient/family/NH records and if not entered, this has to be entered at [facility] point of entry) Patient coming from: Home  I have personally briefly reviewed patient's old medical records in [place]  Chief Complaint: SOB  HPI: JEANPIERRE THEBEAU is a 74 y.o. male with medical history significant of chronic hypoxic and hypercapnic respiratory failure secondary to COPD and interstitial lung disease, paroxysmal A. fib on Eliquis, hypertension, HLD, history of skin cancer status post left nephrectomy,gout, morbid obesity, presented with increasing short of breath.  Patient has had worsening of breathing problems/shortness of breath this year, his baseline was increased from 4L to 6 L early this year and pulmonologist started him on Trilogy HS 2 weeks ago, he started to feel increasing short of breath last week with minimal exertions, pulse ox dropped to lower 80s on minimal exertion.  Pulmonary further increased is home O2 to 8 L 4 days ago, despite pulse ox reading remains low.  He denies any cough no fever chills no chest pains.  Yesterday he felt even sitting at rest he felt significant shortness of breath and he tried increased oxygen to 10 L, however symptoms not improved at all. ED Course: Pulse ox in the 60s on arrival, x-ray suggest extensive pulmonary interstitial changes and chronic COPD changes.  ABG showed normal pH, PCO2 80.  Patient refused BiPAP in the ED.  Was maintained on 10 L however desatted frequently and finally stabilized on 15 L via nasal cannula.  Review of Systems: As per HPI otherwise 14 point review of systems negative.    Past Medical History:  Diagnosis Date  . Allergic rhinitis   . Arthritis   . Cancer [facility])    left renal -solitary kidney  . COPD (chronic obstructive pulmonary disease) ([place])   . Erectile dysfunction   . Gout   .  Hernia   . History of kidney cancer   . Hypercholesterolemia   . Hypertension   . RLS (restless legs syndrome)   . Sinus problem     Past Surgical History:  Procedure Laterality Date  . CARDIAC CATHETERIZATION    . HERNIA REPAIR  06/04/11  . left kidney removed  1994     reports that he quit smoking about 9 years ago. His smoking use included cigarettes. He has a 100.00 pack-year smoking history. He has never used smokeless tobacco. He reports current alcohol use of about 2.0 - 3.0 standard drinks of alcohol per week. He reports that he does not use drugs.  No Known Allergies  Family History  Problem Relation Age of Onset  . COPD Mother   . Hypertension Mother   . Osteoporosis Mother   . Prostate cancer Father   . Cancer Father        prostate     Prior to Admission medications   Medication Sig Start Date End Date Taking? Authorizing Provider  albuterol (PROAIR HFA) 108 (90 Base) MCG/ACT inhaler INHALE 2 PUFFS BY MOUTH INTO THE LUNGS EVERY 4 HOURS AS NEEDED FOR WHEEZING OR SHORTNESS OF BREATH Patient taking differently: Inhale 2 puffs into the lungs every 4 (four) hours as needed for wheezing or shortness of breath.  12/14/18  Yes Juanito Doom, MD  allopurinol (ZYLOPRIM) 300 MG tablet Take 300 mg by mouth daily.   Yes [provider]  ALPRAZolam Duanne Moron) 0.5 MG tablet Take 1  tablet (0.5 mg total) by mouth 2 (two) times daily. Patient taking differently: Take 0.5 mg by mouth at bedtime.  08/24/14  Yes Clance, Armando Reichert, MD  amLODipine (NORVASC) 10 MG tablet Take 10 mg by mouth daily.    Yes [provider]  atorvastatin (LIPITOR) 20 MG tablet Take 20 mg by mouth daily.   Yes [provider]  azelastine (ASTELIN) 0.1 % nasal spray Place 1 spray into both nostrils 2 (two) times daily.  03/20/15  Yes [provider]  azithromycin (ZITHROMAX) 250 MG tablet Take Monday, Wednesday, Friday only with food. Patient taking differently: Take 250 mg by  mouth every Monday, Wednesday, and Friday.  11/29/19  Yes Lauraine Rinne, NP  Budeson-Glycopyrrol-Formoterol (BREZTRI AEROSPHERE) 160-9-4.8 MCG/ACT AERO Inhale 2 puffs into the lungs in the morning and at bedtime. Patient taking differently: Inhale 2 puffs into the lungs 2 (two) times daily.  04/15/20  Yes Lauraine Rinne, NP  Cholecalciferol (VITAMIN D-3) 25 MCG (1000 UT) CAPS Take 1,000 Units by mouth at bedtime.   Yes [provider]  DALIRESP 500 MCG TABS tablet TAKE 1 TABLET EVERY DAY Patient taking differently: Take 500 mcg by mouth in the morning.  10/20/19  Yes Martyn Ehrich, NP  ELIQUIS 5 MG TABS tablet TAKE 1 TABLET TWO TIMES DAILY Patient taking differently: Take 5 mg by mouth 2 (two) times daily.  03/05/20  Yes Adrian Prows, MD  fish oil-omega-3 fatty acids 1000 MG capsule Take 1 g by mouth 3 (three) times daily.    Yes [provider]  flecainide (TAMBOCOR) 100 MG tablet TAKE 1  TABLET TWO TIMES DAILY Patient taking differently: Take 100 mg by mouth 2 (two) times daily.  06/12/20  Yes Adrian Prows, MD  furosemide (LASIX) 40 MG tablet TAKE 1 TABLET TWICE DAILY IN THE MORNING AND AT 3 PM AS NEEDED FOR FLUID Patient taking differently: Take 40 mg by mouth 2 (two) times daily.  04/11/20  Yes Adrian Prows, MD  ipratropium-albuterol (DUONEB) 0.5-2.5 (3) MG/3ML SOLN Use every 6-8 hours as needed for wheezing and SOB. Patient taking differently: Take 3 mLs by nebulization every 6 (six) hours as needed (for shortness of breath or wheezing).  06/06/20  Yes Lauraine Rinne, NP  Linaclotide (LINZESS) 145 MCG CAPS capsule Take 145 mcg by mouth daily before breakfast.    Yes [provider]  losartan-hydrochlorothiazide (HYZAAR) 100-25 MG tablet Take 1 tablet by mouth daily.  05/16/18  Yes [provider]  Magnesium 500 MG TABS Take 500 mg by mouth at bedtime.   Yes [provider]  metoprolol succinate (TOPROL-XL) 50 MG 24 hr tablet Take 50 mg by mouth daily. Take with  or immediately following a meal.   Yes [provider]  montelukast (SINGULAIR) 10 MG tablet TAKE 1 TABLET AT BEDTIME Patient taking differently: Take 10 mg by mouth at bedtime.  04/11/20  Yes Lauraine Rinne, NP  Multiple Vitamins-Minerals (ONE-A-DAY MENS 50+) TABS Take 1 tablet by mouth daily with breakfast.   Yes [provider]  nitroGLYCERIN (NITROSTAT) 0.4 MG SL tablet Place 0.4 mg under the tongue every 5 (five) minutes as needed for chest pain.   Yes [provider]  nystatin (MYCOSTATIN) 100000 UNIT/ML suspension Take 5 mLs (500,000 Units total) by mouth 4 (four) times daily. Patient taking differently: Take 5 mLs by mouth 4 (four) times daily as needed (AS DIRECTED).  03/27/20  Yes Lauraine Rinne, NP  OXYGEN Inhale  6-8 L/min into the lungs continuous. Inhale 6-7 L/min of oxygen continuously and increase to 8 L/min, if exerted   Yes [provider]  potassium chloride (KLOR-CON) 10 MEQ tablet TAKE 1 TABLET TWO TIMES DAILY AS NEEDED WITH FUROSEMIDE Patient taking differently: Take 10 mEq by mouth 2 (two) times daily.  06/12/20  Yes Adrian Prows, MD  Respiratory Therapy Supplies (FLUTTER) DEVI Use as directed 05/10/18  Yes Lauraine Rinne, NP  rOPINIRole (REQUIP) 0.5 MG tablet Take 1 tablet (0.5 mg total) by mouth daily. Patient taking differently: Take 0.5 mg by mouth at bedtime.  04/05/13  Yes Clance, Armando Reichert, MD  sodium chloride HYPERTONIC 3 % nebulizer solution Take by nebulization as needed for other. Patient taking differently: Take 4 mLs by nebulization daily as needed (to loosen phlegm).  06/06/20  Yes Lauraine Rinne, NP  temazepam (RESTORIL) 15 MG capsule Take 15 mg by mouth at bedtime as needed for sleep.    Yes [provider]  traMADol (ULTRAM) 50 MG tablet TAKE 1 TABLET BY MOUTH THREE TIMES DAILY AS NEEDED FOR MODERATE PAIN Patient taking differently: Take 50 mg by mouth See admin instructions. Take 50 mg by mouth at bedtime and an additional 50 mg up  to two times a day as needed for anxiety 01/26/20  Yes Adrian Prows, MD  vitamin C (ASCORBIC ACID) 500 MG tablet Take 500 mg by mouth daily.   Yes [provider]  budesonide-formoterol (SYMBICORT) 160-4.5 MCG/ACT inhaler Inhale 2 puffs into the lungs 2 (two) times daily. Patient not taking: Reported on 08/04/2020 11/14/19   Lauraine Rinne, NP  Multiple Vitamin (MULITIVITAMIN WITH MINERALS) TABS Take 1 tablet by mouth daily. Patient not taking: Reported on 08/04/2020    [provider]  predniSONE (DELTASONE) 10 MG tablet 4 tabs for 3 days, then 3 tabs for 3 days, 2 tabs for 3 days, then 1 tab for 3 days, then stop Patient not taking: Reported on 08/04/2020 07/08/20   Lauraine Rinne, NP  sodium chloride HYPERTONIC 3 % nebulizer solution  06/06/20   [provider]  Tiotropium Bromide Monohydrate (SPIRIVA RESPIMAT) 2.5 MCG/ACT AERS Inhale 2 puffs into the lungs daily. Patient not taking: Reported on 08/04/2020 12/15/19   Lauraine Rinne, NP    Physical Exam: Vitals:   08/04/20 1630 08/04/20 1700 08/04/20 1730 08/04/20 1900  BP: 110/71 (!) 136/55 113/70 (!) 106/52  Pulse: 95 99 98 97  Resp:  17 (!) 22   Temp:      TempSrc:      SpO2: (!) 89% 90% (!) 88% (!) 85%  Weight:      Height:        Constitutional: NAD, calm, comfortable Vitals:   08/04/20 1630 08/04/20 1700 08/04/20 1730 08/04/20 1900  BP: 110/71 (!) 136/55 113/70 (!) 106/52  Pulse: 95 99 98 97  Resp:  17 (!) 22   Temp:      TempSrc:      SpO2: (!) 89% 90% (!) 88% (!) 85%  Weight:      Height:       Eyes: PERRL, lids and conjunctivae normal ENMT: Mucous membranes are moist. Posterior pharynx clear of any exudate or lesions.Normal dentition.  Neck: normal, supple, no masses, no thyromegaly Respiratory: Diminished breathing sound bilateral.  Increased respiratory effort. No accessory muscle use.  Cardiovascular: Regular rate and rhythm, no murmurs / rubs / gallops. No extremity edema. 2+ pedal pulses. No  carotid bruits.  Abdomen:  no tenderness, no masses palpated. No hepatosplenomegaly. Bowel sounds positive.  Musculoskeletal: no clubbing / cyanosis. No joint deformity upper and lower extremities. Good ROM, no contractures. Normal muscle tone.  Skin: no rashes, lesions, ulcers. No induration Neurologic: CN 2-12 grossly intact. Sensation intact, DTR normal. Strength 5/5 in all 4.  Psychiatric: Normal judgment and insight. Alert and oriented x 3. Normal mood.     Labs on Admission: I have personally reviewed following labs and imaging studies  CBC: Recent Labs  Lab 08/04/20 1344 08/04/20 1437 08/04/20 1558  WBC 7.4  --   --   NEUTROABS 5.5  --   --   HGB 10.6* 11.6* 11.9*  HCT 37.8* 34.0* 35.0*  MCV 87.9  --   --   PLT 211  --   --    Basic Metabolic Panel: Recent Labs  Lab 08/04/20 1344 08/04/20 1437 08/04/20 1558  NA 141 141 142  K 3.5 3.8 4.0  CL 90*  --   --   CO2 41*  --   --   GLUCOSE 214*  --   --   BUN 29*  --   --   CREATININE 1.66*  --   --   CALCIUM 9.2  --   --    GFR: Estimated Creatinine Clearance: 58.6 mL/min (A) (by C-G formula based on SCr of 1.66 mg/dL (H)). Liver Function Tests: Recent Labs  Lab 08/04/20 1344  AST 17  ALT 18  ALKPHOS 70  BILITOT 0.9  PROT 6.5  ALBUMIN 3.2*   No results for input(s): LIPASE, AMYLASE in the last 168 hours. No results for input(s): AMMONIA in the last 168 hours. Coagulation Profile: No results for input(s): INR, PROTIME in the last 168 hours. Cardiac Enzymes: No results for input(s): CKTOTAL, CKMB, CKMBINDEX, TROPONINI in the last 168 hours. BNP (last 3 results) No results for input(s): PROBNP in the last 8760 hours. HbA1C: No results for input(s): HGBA1C in the last 72 hours. CBG: No results for input(s): GLUCAP in the last 168 hours. Lipid Profile: No results for input(s): CHOL, HDL, LDLCALC, TRIG, CHOLHDL, LDLDIRECT in the last 72 hours. Thyroid Function Tests: No results for input(s): TSH, T4TOTAL,  FREET4, T3FREE, THYROIDAB in the last 72 hours. Anemia Panel: No results for input(s): VITAMINB12, FOLATE, FERRITIN, TIBC, IRON, RETICCTPCT in the last 72 hours. Urine analysis: No results found for: COLORURINE, APPEARANCEUR, LABSPEC, Lithium, GLUCOSEU, HGBUR, BILIRUBINUR, KETONESUR, PROTEINUR, UROBILINOGEN, NITRITE, LEUKOCYTESUR  Radiological Exams on Admission: DG Chest Port 1 View  Result Date: 08/04/2020 CLINICAL DATA:  Shortness of breath. EXAM: PORTABLE CHEST 1 VIEW COMPARISON:  May 30, 2020 FINDINGS: Cardiomediastinal silhouette is normal. Mediastinal contours appear intact. There is no evidence of lobar consolidation. Advanced emphysema. Chronic interstitial coarsening, more prominent in the lower lobes. Osseous structures are without acute abnormality. Soft tissues are grossly normal. IMPRESSION: 1. Advanced emphysema and chronic interstitial lung changes. Superimposed interstitial pulmonary edema or early consolidation is difficult to exclude. 2. No evidence of lobar consolidation. Electronically Signed   By: Fidela Salisbury M.D.   On: 08/04/2020 14:40    EKG: Independently reviewed.  Sinus arrhythmia, no acute ST changes  Assessment/Plan Active Problems:   COPD (chronic obstructive pulmonary disease) (HCC)  (please populate well all problems here in Problem List. (For example, if patient is on BP meds at home and you resume or decide to hold them, it is a problem that needs to be her. Same for CAD, COPD, HLD and so on)  Acute on chronic hypoxic and hypercapnic respiratory failure -Patient has extensive lung disease history with Gold stage III COPD and lung function test in 2017 and significant restriction lung disease with significant restrictive lung disease pattern on same PFT in 2017. -Pulmonology was consulted in ED and started patient on short acting and long-acting breathing treatment plus IV Solu-Medrol twice daily. -Other supportive care Mucinex and incentive  spirometry -PT evaluation. -Introduced the idea of palliative care to patient, patient however want to discuss this with patient's pulmonology and family, it appears that patient is to have high hopes of managing his advanced COPD and asking about Zephyr procedure for which he is going to see a specialist in [place] next month.  However from my evaluation, patient prognosis is extremely poor given the significant stepwise increasing oxygen demand this year.  Will discuss with pulmonary regarding introducing palliative care.  Paroxysmal A. Fib -In sinus rhythm, continue Eliquis  HTN -Controlled, continue home meds  HLD -On statin  Gout -No symptoms or signs of acute flareup -Continue allopurinol  DVT prophylaxis: Eliquis  code Status: DNR Family Communication: Left wife detailed message Disposition Plan: Significant advanced lung disease with worsening of hypoxia, expect more than 2 midnight hospital stay for parenteral steroid breathing treatment Consults called: Pulmonology Admission status: PCU   Lequita Halt MD [facility] Pager [phone]  08/04/2020, 7:27 PM

## 2020-08-04 NOTE — ED Notes (Signed)
Called RT for bipap

## 2020-08-05 ENCOUNTER — Inpatient Hospital Stay (HOSPITAL_COMMUNITY): Payer: Medicare Other

## 2020-08-05 ENCOUNTER — Encounter (HOSPITAL_COMMUNITY): Payer: Self-pay | Admitting: Internal Medicine

## 2020-08-05 DIAGNOSIS — I361 Nonrheumatic tricuspid (valve) insufficiency: Secondary | ICD-10-CM

## 2020-08-05 DIAGNOSIS — J9622 Acute and chronic respiratory failure with hypercapnia: Secondary | ICD-10-CM

## 2020-08-05 DIAGNOSIS — J441 Chronic obstructive pulmonary disease with (acute) exacerbation: Secondary | ICD-10-CM

## 2020-08-05 DIAGNOSIS — R0602 Shortness of breath: Secondary | ICD-10-CM

## 2020-08-05 DIAGNOSIS — J9621 Acute and chronic respiratory failure with hypoxia: Secondary | ICD-10-CM

## 2020-08-05 DIAGNOSIS — R609 Edema, unspecified: Secondary | ICD-10-CM

## 2020-08-05 LAB — ECHOCARDIOGRAM COMPLETE
Height: 74 in
S' Lateral: 3.2 cm
Weight: 3280 oz

## 2020-08-05 LAB — PROCALCITONIN: Procalcitonin: 0.39 ng/mL

## 2020-08-05 MED ORDER — LORATADINE 10 MG PO TABS
10.0000 mg | ORAL_TABLET | Freq: Every day | ORAL | Status: DC
  Administered 2020-08-05 – 2020-08-07 (×3): 10 mg via ORAL
  Filled 2020-08-05 (×3): qty 1

## 2020-08-05 MED ORDER — FAMOTIDINE 20 MG PO TABS
20.0000 mg | ORAL_TABLET | Freq: Two times a day (BID) | ORAL | Status: DC
  Administered 2020-08-05: 20 mg via ORAL
  Filled 2020-08-05: qty 1

## 2020-08-05 MED ORDER — INFLUENZA VAC A&B SA ADJ QUAD 0.5 ML IM PRSY
0.5000 mL | PREFILLED_SYRINGE | INTRAMUSCULAR | Status: AC
  Administered 2020-08-07: 0.5 mL via INTRAMUSCULAR
  Filled 2020-08-05 (×2): qty 0.5

## 2020-08-05 MED ORDER — PHENOL 1.4 % MT LIQD
2.0000 | OROMUCOSAL | Status: AC
  Administered 2020-08-05: 2 via OROMUCOSAL
  Filled 2020-08-05: qty 177

## 2020-08-05 MED ORDER — AZITHROMYCIN 250 MG PO TABS
250.0000 mg | ORAL_TABLET | ORAL | Status: DC
  Administered 2020-08-05: 250 mg via ORAL
  Filled 2020-08-05: qty 1

## 2020-08-05 NOTE — Progress Notes (Signed)
Lower extremity venous bilateral study completed.   Please see CV Proc for preliminary results.   Emerie Vanderkolk, RDMS  

## 2020-08-05 NOTE — ED Notes (Signed)
Attempted report 

## 2020-08-05 NOTE — ED Notes (Signed)
Tech in room doing ultrasound.

## 2020-08-05 NOTE — Progress Notes (Signed)
NAME:  Harold Hall, MRN:  [id number], DOB:  Apr 26, 1946, LOS: 1 ADMISSION DATE:  08/04/2020, CONSULTATION DATE:  9/26 REFERRING MD:  Dr Ralene Bathe of ER, CHIEF COMPLAINT:  Worsening hypoxemia in known copd patient   Brief History   See below  History of present illness   74 year old male with known -chronic diastolic dysfunction but negative cardiac stress test June 2017.  Associated obesity, proximal atrial fibrillation, status post left nephrectomy for renal cell carcinoma.  Gold stage III COPD on PFTs but hypercapnic on blood gas and out of proportion hypoxemia.  CT chest in March 2021 showing mostly emphysema with some upper lobe densities.  Echocardiogram last in September 2020 unclear if he has any patent foramen ovale.  Follows with Dr. Leory Plowman Icard in the pulmonary clinic.  Last seen by Wyn Quaker nurse practitioner July 03, 2020.  Based on his history and chart review it appears several changes were initiated.  3 weeks ago his baseline inhaler was changed to triple inhaler regimen called BREZTRI [improved outcomes with exacerbation ).  In 2 weeks ago was started on trilogy nocturnal ventilation.  He says that approximately a week ago he started noticing increased constipation.  At baseline he uses 7 L of oxygen and his baseline pulse ox is between 85 and 95%.  After noticing increased constipation his pulse ox was between 83% and 90%.  Then he seemed to get better with the constipation and correlating with this pulse ox also start getting better.  However 4 days prior to admission started noticing pulse ox 65-70.  His effort tolerance has dropped.  Normally can walk 30 feet before stopping to rest.  Now he is able to walk only 10 feet.  Denies any flulike symptoms.  His Covid PCR and flu are negative.  He is worried about getting hospitalized because he is worried about contagion inside the hospital with Covid.  After some reassurance about the risk for this he is agreed for admission.  At this  point in time he is talking full sentences.  He denies any fever or chills.  His blood gas on arrival shows a PCO2 of 80 which might be higher than his baseline which appears to be around 60.  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  Only Dr. Lorin Picket or Dr. Salvatore Marvel or Dr. Vinnie Langton or Dr. Alois Cliche to read to be read only by thoracic radiology  He has a travel history to [facility] over a week ago.  However he says he is good social distancing.  He denies any other respiratory virus symptoms.  He is on chronic anticoagulation with Eliquis His baseline COPD regimen includes oxygen as above, BREZTRI, azithromycin Monday Wednesday Friday, Singulair and Roflumilast.  The latter 2 being for prevention of COPD exacerbations.  He has a previous 74 pack-year smoking history   Blood: 09/2016 IgE 4 08/15/2018 Hgb 16.9 mg/dL  Echo: 08/15/2018 LVEF > 55%, mod LVH, dilated left atrial, normal RV, no shunt 08/07/2019-echocardiogram-LV ejection fraction 55 to 19%, grade 1 diastolic dysfunction,   PFT Results Latest Ref Rng & Units 04/22/2016  FVC-Pre L 3.58  FVC-Predicted Pre % 69  FVC-Post L 3.78  FVC-Predicted Post % 73  Pre FEV1/FVC % % 49  Post FEV1/FCV % % 47  FEV1-Pre L 1.77  FEV1-Predicted Pre % 46  FEV1-Post L 1.76  DLCO uncorrected ml/min/mmHg 13.18  DLCO UNC% % 34  DLCO corrected ml/min/mmHg 12.67  DLCO COR %Predicted %  33  DLVA Predicted % 48  TLC L 6.45  TLC % Predicted % 82  RV % Predicted % 93    Results for JOURDYN, FERRIN (MRN [id number]) as of 08/04/2020 17:17  Ref. Range 05/30/2020 22:55 08/04/2020 14:37 08/04/2020 15:58  pH, Arterial Latest Ref Range: 7.35 - 7.45  7.430  7.387  pCO2 arterial Latest Ref Range: 32 - 48 mmHg 60.7 (H)  73.9 (HH)  pO2, Arterial Latest Ref Range: 83 - 108 mmHg 48 (L)  52 (L)  pH, Ven Latest Ref Range: 7.25 - 7.43   7.371   pCO2, Ven Latest Ref Range: 44 - 60 mmHg  80.4 (HH)     Past Medical History   has a  past medical history of Allergic rhinitis, Arthritis, Cancer (HCC), COPD (chronic obstructive pulmonary disease) (HCC), Erectile dysfunction, Gout, Hernia, History of kidney cancer, Hypercholesterolemia, Hypertension, RLS (restless legs syndrome), and Sinus problem.   reports that he quit smoking about 9 years ago. His smoking use included cigarettes. He has a 100.00 pack-year smoking history. He has never used smokeless tobacco.  Past Surgical History:  Procedure Laterality Date  . CARDIAC CATHETERIZATION    . HERNIA REPAIR  06/04/11  . left kidney removed  1994    No Known Allergies  Immunization History  Administered Date(s) Administered  . Fluad Quad(high Dose 74+) 08/02/2019  . Influenza Split 08/13/2011, 08/21/2012, 08/17/2014, 08/24/2015  . Influenza Whole 08/12/2009, 08/09/2010  . Influenza, High Dose Seasonal PF 08/09/2013, 08/13/2016, 09/05/2017, 07/08/2018  . PFIZER SARS-COV-2 Vaccination 01/18/2020, 02/15/2020  . Pneumococcal Conjugate-13 08/17/2014  . Pneumococcal Polysaccharide-23 08/10/2007    Family History  Problem Relation Age of Onset  . COPD Mother   . Hypertension Mother   . Osteoporosis Mother   . Prostate cancer Father   . Cancer Father        prostate     Significant Hospital Events   08/04/2020: Admit  Consults:  08/04/2020: Pulmonary consult  Procedures:  X  Significant Diagnostic Tests:  Echo 9/27 >  LE duplex 9/27 >   Micro Data:  Respiratory flu panel and also Covid 08/04/2020: Negative RVP 9/26 Urine leg 9/26 Urine strep 9/26 neg  Antimicrobials:    Interim history/subjective:  He feels improved.  Echo and LE duplex just done, results pending. He would prefer to wait for HRCT until his O2 requirements improved and he can have sedation / anxiolytics (he is claustrophobic)  Objective   Blood pressure 137/60, pulse 85, temperature 98.6 F (37 C), temperature source Oral, resp. rate 19, height 6\' 2"  (1.88 m), weight 93 kg, SpO2 91  %.        Intake/Output Summary (Last 24 hours) at 08/05/2020 1217 Last data filed at 08/05/2020 1030 Gross per 24 hour  Intake 399.8 ml  Output 350 ml  Net 49.8 ml   Filed Weights   08/04/20 1349 08/05/20 0609  Weight: (!) 138.3 kg 93 kg    Examination: General: obese male, in NAD HENT: [place] / AT. MMM Lungs: Distant, clear Cardiovascular: RRR, no M/R/G Abdomen: obese Extremities: mild chronic edema Neuro: AxOx3   Assessment & Plan:   #Baseline  Advanced COPD with out of proportion hypoxemia -requiring 7 L nasal cannula to keep baseline pulse ox 85-95% Associated cor pulmonale on echo Unclear if this coexistent ILD [at least absent in March 2021 CT]  #Current Acute on chronic hypoxemic and hypercapnic respiratory failure -with decline in 1 week prior to admission   -Likely acute exacerbation  COPD -?  Due to change in baseline inhaler regimen [unlikely] versus recent constipation [affecting diaphragm movement and quite likely], other respiratory viruses or infection [need to be ruled out, DVT [on Eliquis but right lower extremities?  Warm], worsening cor pulmonale, ?  Was using his trilogy ventilator currently  Plan - Continue supplemental O2 as needed to maintain SpO2 > 90%. Continue BDs - Continue IV steroids - ContinueBiPAP [he says he has intolerance to it -we will do on and off in the daytime and at least 4 hours at night.  He will need blood gas prior to dc home - Check alpha-1 antitrypsin phenotype - HRCT when able to rule out any interstitial lung disease changes - F/u on echo, LE duplex -Would stop abx given low PCT and no infectious symptoms   Montey Hora, [place] [facility] 08/05/2020, 12:24 PM

## 2020-08-05 NOTE — ED Notes (Signed)
Report given.

## 2020-08-05 NOTE — Progress Notes (Signed)
PROGRESS NOTE  Harold Hall  DOB: 09/08/1946  PCP: Alroy Dust, L.[name], MD BZJ:[id number]  DOA: 08/04/2020  LOS: 1 day   Chief Complaint  Patient presents with  . Shortness of Breath   Brief narrative: Harold Hall is a 74 y.o. male with PMH of COPD on home oxygen, paroxysmal A. fib on Eliquis, left renal cell carcinoma status post nephrectomy, HTN, HLD, gout.   Patient presented to the ED on 08/04/2020 with grossly worsening shortness of breath and hypoxia for 1 week. Patient follows up with pulmonologist Dr. Valeta Harms at the clinic. He has a previous 75 pack-year smoking history. At baseline he uses 7 L of oxygen and his baseline pulse ox is between 85 and 95%.  His inhalers were recently changed. His baseline COPD regimen includes oxygen as above, BREZTRI, azithromycin Monday Wednesday Friday, Singulair and Roflumilast.   Reportedly, his exercise tolerance has recently dropped. Normally he could walk 30 feet before stopping to rest.  Now he is able to walk only 10 feet.   He has a travel history to [facility] over a week ago.  However he says he is good social distancing. He denies any other respiratory virus symptoms.    In the ED, oxygen saturation was 60% on arrival. Blood gas showed PCO2 of 80, which had been his baseline which is in 71s. Covid PCR and influenza A and B PCR negative. Chest x-ray suggested extensive pulmonary interstitial changes and chronic COPD changes.   Patient refused BiPAP.  His oxygen saturation was maintained 15 L high flow oxygen by nasal cannula.  Subjective: Patient was seen and examined this morning. Pleasant elderly Caucasian male.  Sitting up at the edge of the bed.  Not in distress. On 15 L oxygen by high flow nasal cannula. Patient states that he is a mouth breather and does well with facemask and nasal cannula. Chart reviewed. Heart rate in the 80s, blood pressure mostly with 100 and 120.  Remains on 15 L high flow oxygen by nasal  cannula.  Assessment/Plan: Acute on chronic hypoxic and hypercapnic respiratory failure -Likely secondary to COPD exacerbation.   -No evidence of pneumonia. -Pulmonary consultation appreciated.   -Currently on Solu-Medrol 60 mg twice daily. -Continue nebulizers, Mucinex, incentive spirometry. -Inconsistently tolerant to BiPAP.  On 15 L high flow oxygen by nasal cannula today.  I asked the nurse to switch him to facemask.  Advanced COPD with out of proportion hypoxemia -Associated with cor pulmonale. -High-resolution CT pending to rule out ILD. -According to pulmonary, patient has extremely poor prognosis.  He would be a palliative care candidate.  However patient wants to continue aggressive care.  Paroxysmal A. Fib -In sinus rhythm, continue Eliquis  HTN -Controlled, continue home meds  HLD -On statin  Gout -No symptoms or signs of acute flareup -Continue allopurinol  Mobility: Encourage ambulation Code Status:   Code Status: Prior  Nutritional status: Body mass index is 26.32 kg/m.     Diet Order            Diet Heart Room service appropriate? Yes; Fluid consistency: Thin  Diet effective now                 DVT prophylaxis: apixaban (ELIQUIS) tablet 5 mg   Antimicrobials:  Okay to stop antibiotics Fluid: None Consultants: Pulmonary Family Communication:  Daughter at bedside  Status is: Inpatient  Remains inpatient appropriate because:Ongoing diagnostic testing needed not appropriate for outpatient work up and IV treatments appropriate due to  intensity of illness or inability to take PO   Dispo: The patient is from: Home              Anticipated d/c is to: Home              Anticipated d/c date is: 2 days              Patient currently is not medically stable to d/c.  Infusions:    Scheduled Meds: . allopurinol  300 mg Oral Daily  . ALPRAZolam  0.5 mg Oral QHS  . amLODipine  10 mg Oral Daily  . apixaban  5 mg Oral BID  . arformoterol  15 mcg  Nebulization BID  . vitamin C  500 mg Oral Daily  . atorvastatin  20 mg Oral Daily  . azelastine  1 spray Each Nare BID  . budesonide (PULMICORT) nebulizer solution  0.25 mg Nebulization BID  . cholecalciferol  1,000 Units Oral QHS  . flecainide  100 mg Oral BID  . furosemide  40 mg Oral BID  . guaiFENesin  1,200 mg Oral BID  . hydrochlorothiazide  25 mg Oral Daily  . ipratropium-albuterol  3 mL Nebulization Q6H  . linaclotide  145 mcg Oral QAC breakfast  . losartan  100 mg Oral Daily  . magnesium oxide  400 mg Oral QHS  . methylPREDNISolone (SOLU-MEDROL) injection  60 mg Intravenous Q12H  . metoprolol succinate  50 mg Oral Daily  . montelukast  10 mg Oral QHS  . multivitamin with minerals  1 tablet Oral Q breakfast  . potassium chloride  10 mEq Oral BID  . revefenacin  175 mcg Nebulization Daily  . roflumilast  500 mcg Oral q AM  . rOPINIRole  0.5 mg Oral QHS    Antimicrobials: Anti-infectives (From admission, onward)   Start     Dose/Rate Route Frequency Ordered Stop   08/04/20 1530  cefTRIAXone (ROCEPHIN) 1 g in sodium chloride 0.9 % 100 mL IVPB        1 g 200 mL/hr over 30 Minutes Intravenous  Once 08/04/20 1522 08/04/20 1736   08/04/20 1530  azithromycin (ZITHROMAX) 500 mg in sodium chloride 0.9 % 250 mL IVPB        500 mg 250 mL/hr over 60 Minutes Intravenous  Once 08/04/20 1522 08/04/20 2108      PRN meds: ALPRAZolam, ipratropium-albuterol, nitroGLYCERIN, sodium chloride HYPERTONIC, temazepam, traMADol   Objective: Vitals:   08/05/20 0600 08/05/20 0800  BP: 122/63 137/60  Pulse: 84 85  Resp: 19   Temp:    SpO2: 93% 91%    Intake/Output Summary (Last 24 hours) at 08/05/2020 1347 Last data filed at 08/05/2020 1030 Gross per 24 hour  Intake 399.8 ml  Output 350 ml  Net 49.8 ml   Filed Weights   08/04/20 1349 08/05/20 0609  Weight: (!) 138.3 kg 93 kg   Weight change:  Body mass index is 26.32 kg/m.   Physical Exam: General exam: Appears calm and  comfortable.  Not in physical distress Skin: No rashes, lesions or ulcers. HEENT: Atraumatic, normocephalic, supple neck, no obvious bleeding Lungs: Clear to auscultation bilaterally.  No wheezing or crackles at the time of my evaluation CVS: Regular rate and rhythm, no murmur GI/Abd soft, nontender, nondistended, bowel sound present CNS: Alert, awake, oriented x3 Psychiatry: Mood appropriate Extremities: No pedal edema, no calf tenderness  Data Review: I have personally reviewed the laboratory data and studies available.  Recent Labs  Lab 08/04/20  1344 08/04/20 1437 08/04/20 1558  WBC 7.4  --   --   NEUTROABS 5.5  --   --   HGB 10.6* 11.6* 11.9*  HCT 37.8* 34.0* 35.0*  MCV 87.9  --   --   PLT 211  --   --    Recent Labs  Lab 08/04/20 1344 08/04/20 1437 08/04/20 1558  NA 141 141 142  K 3.5 3.8 4.0  CL 90*  --   --   CO2 41*  --   --   GLUCOSE 214*  --   --   BUN 29*  --   --   CREATININE 1.66*  --   --   CALCIUM 9.2  --   --     F/u labs ordered  Signed, Terrilee Croak, MD [facility] 08/05/2020

## 2020-08-05 NOTE — Evaluation (Signed)
Physical Therapy Evaluation Patient Details Name: Harold Hall MRN: [id number] DOB: Jan 07, 1946 Today's Date: 08/05/2020   History of Present Illness  Harold Hall is a 74 y.o. male with PMH of COPD on home oxygen, paroxysmal A. fib on Eliquis, left renal cell carcinoma status post nephrectomy, HTN, HLD, gout.  Patient presented to the ED on 08/04/2020 with grossly worsening shortness of breath and hypoxia for 1 week.  Patient follows up with pulmonologist Dr. Valeta Harms at the clinic. He has a previous 75pack-year smoking history. At baseline he uses 7 L of oxygen and his baseline pulse ox is between 85 and 95%.  Clinical Impression  Pt admitted with/for worsening SOB.  Pt is not at baseline O2 requirements and needs min guard to supervision for mobility..  Pt currently limited functionally due to the problems listed below.  (see problems list.)  Pt will benefit from PT to maximize function and safety to be able to get home safely with available assist.     Follow Up Recommendations Home health PT;Supervision - Intermittent;Other (comment) (It would be nice for pt to return to pulm rehab post covid.)    Equipment Recommendations  None recommended by PT    Recommendations for Other Services       Precautions / Restrictions Precautions Precautions: Fall (minimal risk)      Mobility  Bed Mobility               General bed mobility comments: OOB in the recliner which he sleeps in at home.  Transfers Overall transfer level: Needs assistance   Transfers: Sit to/from Stand Sit to Stand: Supervision         General transfer comment: used UE's appropriately  Ambulation/Gait Ambulation/Gait assistance: Min guard;Supervision Gait Distance (Feet): 100 Feet Assistive device: IV Pole Gait Pattern/deviations: Step-through pattern   Gait velocity interpretation: <1.31 ft/sec, indicative of household ambulator General Gait Details: slow but steady, SpO2 on 15L HFNC dropped  to 76%.  bumped to 25L and pts sats rose to 84% on return and once bace decreased O2 delivery to 15L and pt topped out at 95% in the chair.  HR was stable in the 100's  Stairs            Wheelchair Mobility    Modified Rankin (Stroke Patients Only)       Balance Overall balance assessment: Needs assistance;No apparent balance deficits (not formally assessed) Sitting-balance support: No upper extremity supported;Feet supported Sitting balance-Leahy Scale: Fair     Standing balance support: No upper extremity supported;Single extremity supported Standing balance-Leahy Scale: Fair                               Pertinent Vitals/Pain Pain Assessment: Faces Faces Pain Scale: No hurt Pain Intervention(s): Monitored during session    Home Living Family/patient expects to be discharged to:: Private residence Living Arrangements: Spouse/significant other Available Help at Discharge: Family;Available 24 hours/day Type of Home: House Home Access: Stairs to enter Entrance Stairs-Rails: Psychiatric nurse of Steps: 4 Home Layout: One level Home Equipment: Walker - 4 wheels;Shower seat;Hand held shower head Additional Comments: sleeps in a recliner    Prior Function Level of Independence: Needs assistance   Gait / Transfers Assistance Needed: mod I in the home  ADL's / Homemaking Assistance Needed: mostly Independent with ADL's given time.        Hand Dominance        Extremity/Trunk  Assessment   Upper Extremity Assessment Upper Extremity Assessment: Overall WFL for tasks assessed    Lower Extremity Assessment Lower Extremity Assessment: Overall WFL for tasks assessed       Communication   Communication: No difficulties  Cognition Arousal/Alertness: Awake/alert Behavior During Therapy: WFL for tasks assessed/performed Overall Cognitive Status: Within Functional Limits for tasks assessed                                         General Comments General comments (skin integrity, edema, etc.): pt's wife present and adding information.    Exercises     Assessment/Plan    PT Assessment Patient needs continued PT services  PT Problem List Decreased activity tolerance;Decreased mobility;Cardiopulmonary status limiting activity       PT Treatment Interventions Gait training;Functional mobility training;Therapeutic activities;Patient/family education;DME instruction;Stair training    PT Goals (Current goals can be found in the Care Plan section)  Acute Rehab PT Goals Patient Stated Goal: get back home, get back down to my normal O2 level. PT Goal Formulation: With patient Time For Goal Achievement: 08/12/20 Potential to Achieve Goals: Good    Frequency Min 3X/week   Barriers to discharge        Co-evaluation               AM-PAC PT "6 Clicks" Mobility  Outcome Measure Help needed turning from your back to your side while in a flat bed without using bedrails?: A Little Help needed moving from lying on your back to sitting on the side of a flat bed without using bedrails?: A Little Help needed moving to and from a bed to a chair (including a wheelchair)?: A Little Help needed standing up from a chair using your arms (e.g., wheelchair or bedside chair)?: A Little Help needed to walk in hospital room?: A Little Help needed climbing 3-5 steps with a railing? : A Little 6 Click Score: 18    End of Session Equipment Utilized During Treatment: Oxygen Activity Tolerance: Patient tolerated treatment well Patient left: in chair;with call bell/phone within reach;with family/visitor present Nurse Communication: Mobility status PT Visit Diagnosis: Other abnormalities of gait and mobility (R26.89);Difficulty in walking, not elsewhere classified (R26.2)    Time: 1720-1750 PT Time Calculation (min) (ACUTE ONLY): 30 min   Charges:   PT Evaluation $PT Eval Moderate Complexity: 1 Mod PT  Treatments $Gait Training: 8-22 mins        08/05/2020  Ginger Carne., PT Acute Rehabilitation Services [phone]  (pager) [phone]  (office)  Tessie Fass Paytience Bures 08/05/2020, 6:15 PM

## 2020-08-05 NOTE — Progress Notes (Signed)
Per MD q shift vital signs.

## 2020-08-05 NOTE — Progress Notes (Signed)
  Echocardiogram 2D Echocardiogram has been performed.  Harold Hall 08/05/2020, 9:17 AM

## 2020-08-05 NOTE — Progress Notes (Signed)
Pt wants to go on Bipap at 2230pm

## 2020-08-06 LAB — MAGNESIUM: Magnesium: 2.8 mg/dL — ABNORMAL HIGH (ref 1.7–2.4)

## 2020-08-06 LAB — CBC WITH DIFFERENTIAL/PLATELET
Abs Immature Granulocytes: 0.06 10*3/uL (ref 0.00–0.07)
Basophils Absolute: 0 10*3/uL (ref 0.0–0.1)
Basophils Relative: 0 %
Eosinophils Absolute: 0 10*3/uL (ref 0.0–0.5)
Eosinophils Relative: 0 %
HCT: 36.8 % — ABNORMAL LOW (ref 39.0–52.0)
Hemoglobin: 10.7 g/dL — ABNORMAL LOW (ref 13.0–17.0)
Immature Granulocytes: 1 %
Lymphocytes Relative: 5 %
Lymphs Abs: 0.5 10*3/uL — ABNORMAL LOW (ref 0.7–4.0)
MCH: 24.4 pg — ABNORMAL LOW (ref 26.0–34.0)
MCHC: 29.1 g/dL — ABNORMAL LOW (ref 30.0–36.0)
MCV: 84 fL (ref 80.0–100.0)
Monocytes Absolute: 0.4 10*3/uL (ref 0.1–1.0)
Monocytes Relative: 3 %
Neutro Abs: 10.4 10*3/uL — ABNORMAL HIGH (ref 1.7–7.7)
Neutrophils Relative %: 91 %
Platelets: 271 10*3/uL (ref 150–400)
RBC: 4.38 MIL/uL (ref 4.22–5.81)
RDW: 16.7 % — ABNORMAL HIGH (ref 11.5–15.5)
WBC: 11.3 10*3/uL — ABNORMAL HIGH (ref 4.0–10.5)
nRBC: 0 % (ref 0.0–0.2)

## 2020-08-06 LAB — BASIC METABOLIC PANEL
Anion gap: 12 (ref 5–15)
BUN: 62 mg/dL — ABNORMAL HIGH (ref 8–23)
CO2: 36 mmol/L — ABNORMAL HIGH (ref 22–32)
Calcium: 9.4 mg/dL (ref 8.9–10.3)
Chloride: 90 mmol/L — ABNORMAL LOW (ref 98–111)
Creatinine, Ser: 2.15 mg/dL — ABNORMAL HIGH (ref 0.61–1.24)
GFR calc Af Amer: 34 mL/min — ABNORMAL LOW (ref >60)
GFR calc non Af Amer: 29 mL/min — ABNORMAL LOW (ref >60)
Glucose, Bld: 169 mg/dL — ABNORMAL HIGH (ref 70–99)
Potassium: 4.5 mmol/L (ref 3.5–5.1)
Sodium: 138 mmol/L (ref 135–145)

## 2020-08-06 LAB — LEGIONELLA PNEUMOPHILA SEROGP 1 UR AG: L. pneumophila Serogp 1 Ur Ag: NEGATIVE

## 2020-08-06 LAB — PROCALCITONIN: Procalcitonin: 0.35 ng/mL

## 2020-08-06 MED ORDER — FAMOTIDINE 20 MG PO TABS
20.0000 mg | ORAL_TABLET | Freq: Every day | ORAL | Status: DC
  Administered 2020-08-06 – 2020-08-07 (×2): 20 mg via ORAL
  Filled 2020-08-06 (×2): qty 1

## 2020-08-06 MED ORDER — METHYLPREDNISOLONE SODIUM SUCC 125 MG IJ SOLR
60.0000 mg | INTRAMUSCULAR | Status: DC
  Administered 2020-08-07: 60 mg via INTRAVENOUS
  Filled 2020-08-06: qty 2

## 2020-08-06 MED ORDER — ENSURE ENLIVE PO LIQD
237.0000 mL | Freq: Two times a day (BID) | ORAL | Status: DC
  Administered 2020-08-07: 237 mL via ORAL

## 2020-08-06 MED ORDER — HYDRALAZINE HCL 20 MG/ML IJ SOLN: 10.0000 mg | Freq: Four times a day (QID) | INTRAMUSCULAR | Status: DC | PRN

## 2020-08-06 NOTE — Evaluation (Signed)
Occupational Therapy Evaluation Patient Details Name: Harold Hall MRN: [id number] DOB: 01/26/1946 Today's Date: 08/06/2020    History of Present Illness Harold Hall is a 74 y.o. male with PMH of COPD on home oxygen, paroxysmal A. fib on Eliquis, left renal cell carcinoma status post nephrectomy, HTN, HLD, gout.  Patient presented to the ED on 08/04/2020 with grossly worsening shortness of breath and hypoxia for 1 week.  Patient follows up with pulmonologist Dr. Valeta Harms at the clinic. He has a previous 75pack-year smoking history. At baseline he uses 7 L of oxygen and his baseline pulse ox is between 85 and 95%.   Clinical Impression   PTA patient independent and driving. Admitted for above and limited by problem list below, including decreased activity tolerance and endurance.  Patient currently completing ADLs at min guard to supervision level, mobility at supervision level with no AD.  On 7L supplemental O2 via HFNC at baseline, as well as today at rest; bumped up to 8L on tank during ADLs and limited mobility.  Patients SpO2 at rest ranged from 88-90%, during activity desaturated to 72%- requires standing rest breaks, cueing for PLB and pacing. Initiated energy conservation and AE/ ADL compensatory technique education. Believe patient will benefit from further OT services acutely to optimize independence, safety and tolerance to daily ADLs but anticipate no further needs after dc home.     Follow Up Recommendations  Supervision - Intermittent;No OT follow up    Equipment Recommendations  None recommended by OT    Recommendations for Other Services       Precautions / Restrictions Precautions Precautions: Fall Precaution Comments: watch O2 Restrictions Weight Bearing Restrictions: No      Mobility Bed Mobility               General bed mobility comments: OOB in recliner upon entry   Transfers Overall transfer level: Needs assistance   Transfers: Sit to/from  Stand Sit to Stand: Supervision         General transfer comment: supervision for safety     Balance Overall balance assessment: No apparent balance deficits (not formally assessed)                                         ADL either performed or assessed with clinical judgement   ADL Overall ADL's : Needs assistance/impaired     Grooming: Supervision/safety;Standing   Upper Body Bathing: Set up;Sitting   Lower Body Bathing: Min guard;Sit to/from stand   Upper Body Dressing : Set up;Sitting   Lower Body Dressing: Min guard;Sit to/from stand   Toilet Transfer: Min guard;Ambulation Toilet Transfer Details (indicate cue type and reason): simulated in room  Toileting- Clothing Manipulation and Hygiene: Supervision/safety;Sit to/from stand Toileting - Clothing Manipulation Details (indicate cue type and reason): using urinal in restroom      Functional mobility during ADLs: Min guard General ADL Comments: limited by decreaed activity tolerance and endurance      Vision         Perception     Praxis      Pertinent Vitals/Pain Pain Assessment: Faces Faces Pain Scale: No hurt Pain Intervention(s): Monitored during session     Hand Dominance Right   Extremity/Trunk Assessment Upper Extremity Assessment Upper Extremity Assessment: Overall WFL for tasks assessed   Lower Extremity Assessment Lower Extremity Assessment: Overall WFL for tasks assessed  Communication Communication Communication: No difficulties   Cognition Arousal/Alertness: Awake/alert Behavior During Therapy: WFL for tasks assessed/performed Overall Cognitive Status: Within Functional Limits for tasks assessed                                     General Comments  spouse present and supportive; pt on 7L O2 at rest SpO2 ranged from 88-90%; increased to 8L on tank with activity with SpO2 desaturation to 72% (requires cueing for PLB, standing rest breaks)      Exercises     Shoulder Instructions      Home Living Family/patient expects to be discharged to:: Private residence Living Arrangements: Spouse/significant other Available Help at Discharge: Family;Available 24 hours/day Type of Home: House Home Access: Stairs to enter [employer] of Steps: 4 Entrance Stairs-Rails: Right;Left Home Layout: One level     Bathroom Shower/Tub: Occupational psychologist:  (comfort height commode)     Home Equipment: Environmental consultant - 4 wheels;Shower seat;Hand held shower head;Cane - single point;Grab bars - tub/shower;Grab bars - toilet   Additional Comments: sleeps in a recliner- lift chair       Prior Functioning/Environment Level of Independence: Needs assistance  Gait / Transfers Assistance Needed: mod I in the home ADL's / Homemaking Assistance Needed: independent with ADLs, limited IADLs, driving             OT Problem List: Decreased activity tolerance;Decreased knowledge of use of DME or AE;Decreased knowledge of precautions;Cardiopulmonary status limiting activity      OT Treatment/Interventions: Self-care/ADL training;DME and/or AE instruction;Therapeutic activities;Balance training;Patient/family education;Energy conservation    OT Goals(Current goals can be found in the care plan section) Acute Rehab OT Goals Patient Stated Goal: get back home, get back down to my normal O2 level. OT Goal Formulation: With patient Time For Goal Achievement: 08/20/20 Potential to Achieve Goals: Good  OT Frequency: Min 2X/week   Barriers to D/C:            Co-evaluation              AM-PAC OT "6 Clicks" Daily Activity     Outcome Measure Help from another person eating meals?: None Help from another person taking care of personal grooming?: A Little Help from another person toileting, which includes using toliet, bedpan, or urinal?: A Little Help from another person bathing (including washing, rinsing, drying)?: A  Little Help from another person to put on and taking off regular upper body clothing?: None Help from another person to put on and taking off regular lower body clothing?: A Little 6 Click Score: 20   End of Session Equipment Utilized During Treatment: Oxygen Nurse Communication: Mobility status;Other (comment) (O2 level, and not using connectors )  Activity Tolerance: Patient tolerated treatment well Patient left: in chair;with call bell/phone within reach;with family/visitor present  OT Visit Diagnosis: Unsteadiness on feet (R26.81);Other (comment) (decrased activity tolerance )                Time: 3154-0086 OT Time Calculation (min): 28 min Charges:  OT General Charges $OT Visit: 1 Visit OT Evaluation $OT Eval Moderate Complexity: 1 Mod OT Treatments $Self Care/Home Management : 8-22 mins  Jolaine Artist, OT Acute Rehabilitation Services Pager [phone] Office [phone]   Delight Stare 08/06/2020, 2:01 PM

## 2020-08-06 NOTE — Plan of Care (Signed)

## 2020-08-06 NOTE — Progress Notes (Signed)
RT called by RN due to problem with the [place] HF water bottle blocking airflow. For patient safety, RN placed [name] on the flow meter at 7 L until RT could access it. RT was able to fixed the pressure release on the humidity bottle and placed it back on the patient. Patient confirmed he could feel his oxygen flow.

## 2020-08-06 NOTE — Progress Notes (Signed)
Initial Nutrition Assessment  DOCUMENTATION CODES:   Not applicable  INTERVENTION:    Ensure Enlive po BID, each supplement provides 350 kcal and 20 grams of protein  MVI daily   NUTRITION DIAGNOSIS:   Increased nutrient needs related to chronic illness as evidenced by estimated needs.  GOAL:   Patient will meet greater than or equal to 90% of their needs  MONITOR:   PO intake, Supplement acceptance, Weight trends, Labs, I & O's  REASON FOR ASSESSMENT:   Consult COPD Protocol  ASSESSMENT:   Patient with PMH significant for COPD, interstitial lung disease, HTN, and hypercholesterolemia. Presents this admission with COPD exacerbation.   Unable to obtain nutrition history at this time. No meal completions documented this admission. RD to provide supplementation to maximize kcal and protein this admission.   Pt shows to have weighed 305 lb on 8/25 and 205 lb this admission. Suspect error as admission weight looks to be stated. Will need to obtain actual weight to assess for weight loss.   Medications: 500 mg Vit C daily, Vit D, Mag Ox, solumedrol, MVI with minerals  Labs: Cr 2.15- up from yesterday magnesium 2.8 (H)   Diet Order:   Diet Order            Diet Heart Room service appropriate? Yes; Fluid consistency: Thin  Diet effective now                 EDUCATION NEEDS:   Not appropriate for education at this time  Skin:  Skin Assessment: Reviewed RN Assessment  Last BM:  9/27  Height:   Ht Readings from Last 1 Encounters:  08/05/20 6\' 2"  (1.88 m)    Weight:   Wt Readings from Last 1 Encounters:  08/05/20 93 kg    BMI:  Body mass index is 26.32 kg/m.  Estimated Nutritional Needs:   Kcal:  2200-2400 kcal  Protein:  115-130 grams  Fluid:  >/= 2.2 L/day  Mariana Single RD, LDN Clinical Nutrition Pager listed in [place]

## 2020-08-06 NOTE — TOC Initial Note (Signed)
Transition of Care [facility]) - Initial/Assessment Note    Patient Details  Name: Harold Hall MRN: [id number] Date of Birth: 1946/01/06  [facility] CM/SW Contact:    Joanne Chars, LCSW Phone Number: 08/06/2020, 1:54 PM  Clinical Narrative:   CSW discussed DC plan with pt and wife.  Pt gets O2 supplies through [employer].  Agreeable to [facility], choice document given and pt would like [facility].  Permission given to speak with wife and HH.  Pt is vaccinated.  No equipment needs identified: pt has rolling walker, shower chair at home.  Confirmed address and phone contact info.                Expected Discharge Plan: [place] Barriers to Discharge: Continued Medical Work up   Patient Goals and CMS Choice Patient states their goals for this hospitalization and ongoing recovery are:: get back to being more active, more mobile CMS Medicare.gov Compare Post Acute Care list provided to:: Patient Choice offered to / list presented to : Patient  Expected Discharge Plan and Services Expected Discharge Plan: [place] Choice: [place] arrangements for the past 2 months: Single Family Home                                      Prior Living Arrangements/Services Living arrangements for the past 2 months: Single Family Home Lives with:: Spouse Patient language and need for interpreter reviewed:: Yes Do you feel safe going back to the place where you live?: Yes      Need for Family Participation in Patient Care: No (Comment) Care giver support system in place?: Yes (comment) Current home services: DME Criminal Activity/Legal Involvement Pertinent to Current Situation/Hospitalization: No - Comment as needed  Activities of Daily Living Home Assistive Devices/Equipment: Gilford Rile (specify type) ADL Screening (condition at time of admission) Patient's cognitive ability adequate to safely complete daily  activities?: Yes Is the patient deaf or have difficulty hearing?: Yes Does the patient have difficulty seeing, even when wearing glasses/contacts?: No Does the patient have difficulty concentrating, remembering, or making decisions?: No Patient able to express need for assistance with ADLs?: Yes Does the patient have difficulty dressing or bathing?: No Independently performs ADLs?: Yes (appropriate for developmental age) Does the patient have difficulty walking or climbing stairs?: Yes Weakness of Legs: Both Weakness of Arms/Hands: None  Permission Sought/Granted Permission sought to share information with : Facility Sport and exercise psychologist, Family Supports Permission granted to share information with : Yes, Verbal Permission Granted  Share Information with NAME: Melissa, wife  Permission granted to share info w AGENCY: HH        Emotional Assessment Appearance:: Appears stated age Attitude/Demeanor/Rapport: Engaged Affect (typically observed): Pleasant Orientation: : Oriented to Self, Oriented to Place, Oriented to  Time, Oriented to Situation Alcohol / Substance Use: Not Applicable Psych Involvement: No (comment)  Admission diagnosis:  COPD (chronic obstructive pulmonary disease) ([place]) [J44.9] COPD exacerbation ([place]) [J44.1] Patient Active Problem List   Diagnosis Date Noted  . COPD (chronic obstructive pulmonary disease) ([place]) 08/04/2020  . Diastolic CHF ([place]) 61/60/7371  . Thrush, oral 06/13/2019  . Medication management 06/13/2019  . Therapeutic drug monitoring 06/13/2019  . Edema 04/26/2018  . Atrial fibrillation ([place]) 04/21/2016  . Dyspnea 04/21/2016  . Chronic respiratory failure with hypoxia ([place]) 10/24/2015  .  COPD exacerbation ([place]) 10/22/2011  . Pulmonary nodule 05/19/2011  . Abdominal pain, left upper quadrant at site of previous nephrectomy 1994 05/06/2011  . Umbilical hernia 98/92/1194  . Inguinal hernia bilateral, non-recurrent 05/06/2011  . RESTLESS LEGS  SYNDROME 10/06/2010  . COPD (chronic obstructive pulmonary disease) with emphysema ([place]) 08/12/2007   PCP:  Alroy Dust, L.[name], MD Pharmacy:   [facility], [place] [address] Phone: [phone] Fax: [phone]  [place] [place], [place] [address] Phone: [phone] Fax: [phone]  [place] [address], [place] - [address] [place] [address] Phone: [phone] Fax: [facility] [place], [place] [address] Phone: [phone] Fax: [phone]     Social Determinants of Health (SDOH) Interventions    Readmission Risk Interventions No flowsheet data found.

## 2020-08-06 NOTE — Progress Notes (Signed)
NAME:  Harold Hall, MRN:  [id number], DOB:  1946/08/29, LOS: 2 ADMISSION DATE:  08/04/2020, CONSULTATION DATE:  9/26 REFERRING MD:  Dr Ralene Bathe of ER, CHIEF COMPLAINT:  Worsening hypoxemia in known copd patient   Brief History   See below  History of present illness   74 year old male with known -chronic diastolic dysfunction but negative cardiac stress test June 2017.  Associated obesity, proximal atrial fibrillation, status post left nephrectomy for renal cell carcinoma.  Gold stage III COPD on PFTs but hypercapnic on blood gas and out of proportion hypoxemia.  CT chest in March 2021 showing mostly emphysema with some upper lobe densities.  Echocardiogram last in September 2020 unclear if he has any patent foramen ovale.  Follows with Dr. Leory Plowman Icard in the pulmonary clinic.  Last seen by Wyn Quaker nurse practitioner July 03, 2020.  Based on his history and chart review it appears several changes were initiated.  3 weeks ago his baseline inhaler was changed to triple inhaler regimen called BREZTRI [improved outcomes with exacerbation ).  In 2 weeks ago was started on trilogy nocturnal ventilation.  He says that approximately a week ago he started noticing increased constipation.  At baseline he uses 7 L of oxygen and his baseline pulse ox is between 85 and 95%.  After noticing increased constipation his pulse ox was between 83% and 90%.  Then he seemed to get better with the constipation and correlating with this pulse ox also start getting better.  However 4 days prior to admission started noticing pulse ox 65-70.  His effort tolerance has dropped.  Normally can walk 30 feet before stopping to rest.  Now he is able to walk only 10 feet.  Denies any flulike symptoms.  His Covid PCR and flu are negative.  He is worried about getting hospitalized because he is worried about contagion inside the hospital with Covid.  After some reassurance about the risk for this he is agreed for admission.  At this  point in time he is talking full sentences.  He denies any fever or chills.  His blood gas on arrival shows a PCO2 of 80 which might be higher than his baseline which appears to be around 60.  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  Only Dr. Lorin Picket or Dr. Salvatore Marvel or Dr. Vinnie Langton or Dr. Alois Cliche to read to be read only by thoracic radiology  He has a travel history to [facility] over a week ago.  However he says he is good social distancing.  He denies any other respiratory virus symptoms.  He is on chronic anticoagulation with Eliquis His baseline COPD regimen includes oxygen as above, BREZTRI, azithromycin Monday Wednesday Friday, Singulair and Roflumilast.  The latter 2 being for prevention of COPD exacerbations.  He has a previous 75 pack-year smoking history   Blood: 09/2016 IgE 4 08/15/2018 Hgb 16.9 mg/dL  Echo: 08/15/2018 LVEF > 55%, mod LVH, dilated left atrial, normal RV, no shunt 08/07/2019-echocardiogram-LV ejection fraction 55 to 01%, grade 1 diastolic dysfunction,   PFT Results Latest Ref Rng & Units 04/22/2016  FVC-Pre L 3.58  FVC-Predicted Pre % 69  FVC-Post L 3.78  FVC-Predicted Post % 73  Pre FEV1/FVC % % 49  Post FEV1/FCV % % 47  FEV1-Pre L 1.77  FEV1-Predicted Pre % 46  FEV1-Post L 1.76  DLCO uncorrected ml/min/mmHg 13.18  DLCO UNC% % 34  DLCO corrected ml/min/mmHg 12.67  DLCO COR %Predicted %  33  DLVA Predicted % 48  TLC L 6.45  TLC % Predicted % 82  RV % Predicted % 93    Results for Harold Hall, Harold Hall (MRN [id number]) as of 08/04/2020 17:17  Ref. Range 05/30/2020 22:55 08/04/2020 14:37 08/04/2020 15:58  pH, Arterial Latest Ref Range: 7.35 - 7.45  7.430  7.387  pCO2 arterial Latest Ref Range: 32 - 48 mmHg 60.7 (H)  73.9 (HH)  pO2, Arterial Latest Ref Range: 83 - 108 mmHg 48 (L)  52 (L)  pH, Ven Latest Ref Range: 7.25 - 7.43   7.371   pCO2, Ven Latest Ref Range: 44 - 60 mmHg  80.4 (HH)     Past Medical History   has a  past medical history of Allergic rhinitis, Arthritis, Cancer (HCC), COPD (chronic obstructive pulmonary disease) (HCC), Erectile dysfunction, Gout, Hernia, History of kidney cancer, Hypercholesterolemia, Hypertension, RLS (restless legs syndrome), and Sinus problem.   reports that he quit smoking about 9 years ago. His smoking use included cigarettes. He has a 100.00 pack-year smoking history. He has never used smokeless tobacco.  Past Surgical History:  Procedure Laterality Date  . CARDIAC CATHETERIZATION    . HERNIA REPAIR  06/04/11  . left kidney removed  1994    No Known Allergies  Immunization History  Administered Date(s) Administered  . Fluad Quad(high Dose 65+) 08/02/2019  . Influenza Split 08/13/2011, 08/21/2012, 08/17/2014, 08/24/2015  . Influenza Whole 08/12/2009, 08/09/2010  . Influenza, High Dose Seasonal PF 08/09/2013, 08/13/2016, 09/05/2017, 07/08/2018  . PFIZER SARS-COV-2 Vaccination 01/18/2020, 02/15/2020  . Pneumococcal Conjugate-13 08/17/2014  . Pneumococcal Polysaccharide-23 08/10/2007    Family History  Problem Relation Age of Onset  . COPD Mother   . Hypertension Mother   . Osteoporosis Mother   . Prostate cancer Father   . Cancer Father        prostate     Significant Hospital Events   08/04/2020: Admit  Consults:  08/04/2020: Pulmonary consult  Procedures:  X  Significant Diagnostic Tests:  Echo 9/27 > EF 65-70%, G1DD. LE duplex 9/27 > neg  Micro Data:  Respiratory flu panel and also Covid 08/04/2020: Negative RVP 9/26 Urine leg 9/26 Urine strep 9/26 neg  Antimicrobials:    Interim history/subjective:  He feels much better.  Eager to go home.  Objective   Blood pressure (!) 149/59, pulse 77, temperature 98.7 F (37.1 C), temperature source Oral, resp. rate 18, height 6\' 2"  (1.88 m), weight 93 kg, SpO2 95 %.        Intake/Output Summary (Last 24 hours) at 08/06/2020 0943 Last data filed at 08/06/2020 0242 Gross per 24 hour  Intake  --  Output 1000 ml  Net -1000 ml   Filed Weights   08/04/20 1349 08/05/20 0609  Weight: (!) 138.3 kg 93 kg    Examination: General: obese male, sitting up in chair, in NAD HENT: [place] / AT. MMM Lungs: Distant, clear Cardiovascular: RRR, no M/R/G Abdomen: obese Extremities: mild chronic edema Neuro: AxOx3   Assessment & Plan:   #Baseline  Advanced COPD with out of proportion hypoxemia -requiring 7 L nasal cannula to keep baseline pulse ox 85-95% Associated cor pulmonale on echo Unclear if this coexistent ILD [at least absent in March 2021 CT]  #Current Acute on chronic hypoxemic and hypercapnic respiratory failure -with decline in 1 week prior to admission   - Likely acute exacerbation COPD -?  Due to change in baseline inhaler regimen [unlikely] versus recent constipation [affecting diaphragm movement and  quite likely], other respiratory viruses or infection  Plan - Continue supplemental O2 as needed to maintain SpO2 > 90%. - Continue BDs, steroids, diuretics - MWF macrolide added to reduce flares  - Check blood gas prior to dc home to ensure no home BiPAP needs - HRCT when able to rule out any interstitial lung disease changes - Stable for d/c from pulmonary standpoint. - F/u as outpatient (appointment scheduled for Wednesday 08/14/20 at 10:30AM with Wyn Quaker, NP).   Montey Hora, [place] [facility] 08/06/2020, 9:43 AM

## 2020-08-06 NOTE — Progress Notes (Signed)
PROGRESS NOTE  Harold Hall  DOB: [birth year]  PCP: Alroy Dust, L.[name], MD CNO:[id number]  DOA: 08/04/2020  LOS: 2 days   Chief Complaint  Patient presents with  . Shortness of Breath   Brief narrative: PELLEGRINO Hall is a 74 y.o. male with PMH of COPD on home oxygen, paroxysmal A. fib on Eliquis, left renal cell carcinoma status post nephrectomy, HTN, HLD, gout.   Patient presented to the ED on 08/04/2020 with grossly worsening shortness of breath and hypoxia for 1 week. Patient follows up with pulmonologist Dr. Valeta Harms at the clinic. He has a previous 75 pack-year smoking history. At baseline he uses 7 L of oxygen and his baseline pulse ox is between 85 and 95%.  His inhalers were recently changed. His baseline COPD regimen includes oxygen as above, BREZTRI, azithromycin Monday Wednesday Friday, Singulair and Roflumilast.   Reportedly, his exercise tolerance has recently dropped. Normally he could walk 30 feet before stopping to rest.  Now he is able to walk only 10 feet.   He has a travel history to [facility] over a week ago.  However he says he is good social distancing. He denies any other respiratory virus symptoms.    In the ED, oxygen saturation was 60% on arrival. Blood gas showed PCO2 of 80, which had been his baseline which is in 57s. Covid PCR and influenza A and B PCR negative. Chest x-ray suggested extensive pulmonary interstitial changes and chronic COPD changes.   Patient refused BiPAP.  His oxygen saturation was maintained 15 L high flow oxygen by nasal cannula.  Subjective: Patient was seen and examined this morning. Sitting up in chair.  Not in distress.  On 7 L oxygen which is his home requirement. Blood work this morning with creatinine up to 2.15.   Lasix, losartan and HCTZ held this morning  Assessment/Plan: Acute on chronic hypoxic and hypercapnic respiratory failure -Likely secondary to COPD exacerbation.   -No evidence of pneumonia. -Pulmonary  consultation appreciated.   -Clinically improving.  Currently on home requirement of 7 L oxygen. -Currently on Solu-Medrol 60 mg twice daily.  Will reduce it to 60 mg daily. -Continue nebulizers, Mucinex, incentive spirometry. -Continue azithromycin 250 mg MWF.  Advanced COPD with out of proportion hypoxemia -Associated with cor pulmonale. -High-resolution CT scan as an outpatient to rule out ILD. -According to pulmonology, patient has poor prognosis.  He would be a palliative care candidate.  However patient wants to continue aggressive care.  AKI on CKD 3 -Baseline creatinine 1.91 on July.  Creatinine up to 2.15 today. -Likely due to IV Lasix given in the ED.  Patient was also maintained on oral Lasix, HCTZ and losartan. -I held all of them this morning. -Repeat creatinine in the morning. Recent Labs    05/30/20 1940 08/04/20 1344 08/06/20 0316  BUN 31* 29* 62*  CREATININE 1.91* 1.66* 2.15*   Chronic diastolic CHF Essential hypertension -EF 65 to 70% with grade 1 diastolic dysfunction -Home meds include amlodipine 10 mg daily, Lasix 40 mg daily, losartan 100 mg daily, HCTZ 25 mg daily. -Got amlodipine this morning.  Lasix losartan HCTZ on hold because of AKI. -Continue to monitor blood pressure. -Hydralazine as needed.  Paroxysmal A. Fib -In sinus rhythm, continue flecainide and Eliquis  HLD -On statin  Gout -No symptoms or signs of acute flareup -Continue allopurinol  Mobility: Encourage ambulation Code Status:   Code Status: Prior  Nutritional status: Body mass index is 26.32 kg/m.  Diet Order            Diet Heart Room service appropriate? Yes; Fluid consistency: Thin  Diet effective now                 DVT prophylaxis: apixaban (ELIQUIS) tablet 5 mg   Antimicrobials:  Antibiotics stopped.  Fluid: None Consultants: Pulmonology Family Communication:  Daughter at bedside  Status is: Inpatient  Remains inpatient appropriate because:  AKI   Dispo: The patient is from: Home              Anticipated d/c is to: Home              Anticipated d/c date is: Hopefully tomorrow if creatinine improves              Patient currently is not medically stable to d/c.  Infusions:    Scheduled Meds: . ALPRAZolam  0.5 mg Oral QHS  . amLODipine  10 mg Oral Daily  . apixaban  5 mg Oral BID  . arformoterol  15 mcg Nebulization BID  . vitamin C  500 mg Oral Daily  . atorvastatin  20 mg Oral Daily  . azelastine  1 spray Each Nare BID  . azithromycin  250 mg Oral Q M,W,F-1800  . budesonide (PULMICORT) nebulizer solution  0.25 mg Nebulization BID  . cholecalciferol  1,000 Units Oral QHS  . famotidine  20 mg Oral Daily  . flecainide  100 mg Oral BID  . guaiFENesin  1,200 mg Oral BID  . influenza vaccine adjuvanted  0.5 mL Intramuscular Tomorrow-1000  . linaclotide  145 mcg Oral QAC breakfast  . loratadine  10 mg Oral Daily  . magnesium oxide  400 mg Oral QHS  . [START ON 08/07/2020] methylPREDNISolone (SOLU-MEDROL) injection  60 mg Intravenous Q24H  . metoprolol succinate  50 mg Oral Daily  . montelukast  10 mg Oral QHS  . multivitamin with minerals  1 tablet Oral Q breakfast  . revefenacin  175 mcg Nebulization Daily  . roflumilast  500 mcg Oral q AM  . rOPINIRole  0.5 mg Oral QHS    Antimicrobials: Anti-infectives (From admission, onward)   Start     Dose/Rate Route Frequency Ordered Stop   08/05/20 1800  azithromycin (ZITHROMAX) tablet 250 mg        250 mg Oral Every M-W-F (1800) 08/05/20 1730     08/04/20 1530  cefTRIAXone (ROCEPHIN) 1 g in sodium chloride 0.9 % 100 mL IVPB        1 g 200 mL/hr over 30 Minutes Intravenous  Once 08/04/20 1522 08/04/20 1736   08/04/20 1530  azithromycin (ZITHROMAX) 500 mg in sodium chloride 0.9 % 250 mL IVPB        500 mg 250 mL/hr over 60 Minutes Intravenous  Once 08/04/20 1522 08/04/20 2108      PRN meds: ALPRAZolam, ipratropium-albuterol, nitroGLYCERIN, sodium chloride HYPERTONIC,  temazepam, traMADol   Objective: Vitals:   08/06/20 0859 08/06/20 1000  BP:    Pulse:  88  Resp:    Temp:    SpO2: 95%     Intake/Output Summary (Last 24 hours) at 08/06/2020 1302 Last data filed at 08/06/2020 0242 Gross per 24 hour  Intake --  Output 850 ml  Net -850 ml   Filed Weights   08/04/20 1349 08/05/20 0609  Weight: (!) 138.3 kg 93 kg   Weight change:  Body mass index is 26.32 kg/m.   Physical Exam: General exam: Appears  calm and comfortable.  Not in physical distress Skin: No rashes, lesions or ulcers. HEENT: Atraumatic, normocephalic, supple neck, no obvious bleeding Lungs: Clear to auscultation bilaterally.  No wheezing or crackles at the time of my evaluation CVS: Regular rate and rhythm, no murmur GI/Abd soft, nontender, nondistended, bowel sound present CNS: Alert, awake, oriented x3 Psychiatry: Mood appropriate Extremities: Trace bilateral pedal edema, no calf tenderness  Data Review: I have personally reviewed the laboratory data and studies available.  Recent Labs  Lab 08/04/20 1344 08/04/20 1437 08/04/20 1558 08/06/20 0316  WBC 7.4  --   --  11.3*  NEUTROABS 5.5  --   --  10.4*  HGB 10.6* 11.6* 11.9* 10.7*  HCT 37.8* 34.0* 35.0* 36.8*  MCV 87.9  --   --  84.0  PLT 211  --   --  271   Recent Labs  Lab 08/04/20 1344 08/04/20 1437 08/04/20 1558 08/06/20 0316  NA 141 141 142 138  K 3.5 3.8 4.0 4.5  CL 90*  --   --  90*  CO2 41*  --   --  36*  GLUCOSE 214*  --   --  169*  BUN 29*  --   --  62*  CREATININE 1.66*  --   --  2.15*  CALCIUM 9.2  --   --  9.4  MG  --   --   --  2.8*    F/u labs ordered  Signed, Terrilee Croak, MD [facility] 08/06/2020

## 2020-08-07 ENCOUNTER — Other Ambulatory Visit: Payer: Self-pay | Admitting: Cardiology

## 2020-08-07 DIAGNOSIS — N179 Acute kidney failure, unspecified: Secondary | ICD-10-CM

## 2020-08-07 DIAGNOSIS — R0609 Other forms of dyspnea: Secondary | ICD-10-CM

## 2020-08-07 DIAGNOSIS — I5031 Acute diastolic (congestive) heart failure: Secondary | ICD-10-CM

## 2020-08-07 LAB — CBC WITH DIFFERENTIAL/PLATELET
Abs Immature Granulocytes: 0.08 10*3/uL — ABNORMAL HIGH (ref 0.00–0.07)
Basophils Absolute: 0 10*3/uL (ref 0.0–0.1)
Basophils Relative: 0 %
Eosinophils Absolute: 0 10*3/uL (ref 0.0–0.5)
Eosinophils Relative: 0 %
HCT: 34.2 % — ABNORMAL LOW (ref 39.0–52.0)
Hemoglobin: 9.8 g/dL — ABNORMAL LOW (ref 13.0–17.0)
Immature Granulocytes: 1 %
Lymphocytes Relative: 7 %
Lymphs Abs: 0.7 10*3/uL (ref 0.7–4.0)
MCH: 24.1 pg — ABNORMAL LOW (ref 26.0–34.0)
MCHC: 28.7 g/dL — ABNORMAL LOW (ref 30.0–36.0)
MCV: 84.2 fL (ref 80.0–100.0)
Monocytes Absolute: 0.9 10*3/uL (ref 0.1–1.0)
Monocytes Relative: 9 %
Neutro Abs: 8.3 10*3/uL — ABNORMAL HIGH (ref 1.7–7.7)
Neutrophils Relative %: 83 %
Platelets: 274 10*3/uL (ref 150–400)
RBC: 4.06 MIL/uL — ABNORMAL LOW (ref 4.22–5.81)
RDW: 16.8 % — ABNORMAL HIGH (ref 11.5–15.5)
WBC: 10 10*3/uL (ref 4.0–10.5)
nRBC: 0 % (ref 0.0–0.2)

## 2020-08-07 LAB — BASIC METABOLIC PANEL
Anion gap: 10 (ref 5–15)
BUN: 73 mg/dL — ABNORMAL HIGH (ref 8–23)
CO2: 37 mmol/L — ABNORMAL HIGH (ref 22–32)
Calcium: 9.3 mg/dL (ref 8.9–10.3)
Chloride: 93 mmol/L — ABNORMAL LOW (ref 98–111)
Creatinine, Ser: 2.04 mg/dL — ABNORMAL HIGH (ref 0.61–1.24)
GFR calc Af Amer: 36 mL/min — ABNORMAL LOW (ref >60)
GFR calc non Af Amer: 31 mL/min — ABNORMAL LOW (ref >60)
Glucose, Bld: 233 mg/dL — ABNORMAL HIGH (ref 70–99)
Potassium: 4.1 mmol/L (ref 3.5–5.1)
Sodium: 140 mmol/L (ref 135–145)

## 2020-08-07 LAB — MAGNESIUM: Magnesium: 3 mg/dL — ABNORMAL HIGH (ref 1.7–2.4)

## 2020-08-07 MED ORDER — LOSARTAN POTASSIUM 50 MG PO TABS
50.0000 mg | ORAL_TABLET | Freq: Every day | ORAL | Status: DC
  Administered 2020-08-07: 50 mg via ORAL
  Filled 2020-08-07: qty 1

## 2020-08-07 MED ORDER — PREDNISONE 10 MG PO TABS: ORAL_TABLET | ORAL | 0 refills | Status: DC

## 2020-08-07 NOTE — TOC Transition Note (Signed)
Transition of Care [facility]) - CM/SW Discharge Note   Patient Details  Name: ANEESH FALLER MRN: [id number] Date of Birth: 25-Jan-1946  [facility]) CM/SW Contact:  Joanne Chars, LCSW Phone Number: 08/07/2020, 1:31 PM   Clinical Narrative:  Pt discharging with [facility].  [employer] already in place for O2 needs and confirmed with Zack that they will continue.  Wife reports pt has 4 tanks currently and no immediate needs.  No equipment needs. No other needs identified.      Final next level of care: [place] Barriers to Discharge: Barriers Resolved   Patient Goals and CMS Choice Patient states their goals for this hospitalization and ongoing recovery are:: get back to being more active, more mobile CMS Medicare.gov Compare Post Acute Care list provided to:: Patient Choice offered to / list presented to : Patient  Discharge Placement                       Discharge Plan and Services     Post Acute Care Choice: Home Health          DME Arranged: N/A         HH Arranged: PT, RN Page Agency: [place] Date [facility] Agency Contacted: 08/07/20 Time [name]: 1658 Representative spoke with at [place]: [place] ([place]) Interventions     Readmission Risk Interventions No flowsheet data found.

## 2020-08-07 NOTE — Progress Notes (Signed)
Pt began to hallucinate once put on bi-pap. He begin insisting that we let him walk around and began standing up. Once I was able to check his O2 it was at 79%, so I bumped O2 up to 15L. I was finally able to get patient to calm down and sit back down. Pt now satting comfortably on 7L at 91%. Physician notified, will continue to monitor.

## 2020-08-07 NOTE — Discharge Instructions (Signed)

## 2020-08-07 NOTE — Progress Notes (Signed)
Pt has not tolerated Bipap well at night. Pt's wife stated that he uses bipap periodically during the day. Physician wants Pt to try using it during the day to see if it will help him sleep and reduce CO2 level.

## 2020-08-07 NOTE — Progress Notes (Signed)
Pt was lethargic, hallucinating, and slow to respond to my questions. Patient was constantly holding head down, and O2 was in the low 80s. O2 was increased and respiratory was called to the room. The physician was called and came up to see the patient's room. With the O2 wide open, the patient O2 sat began to recover in the low 90s. Blood sugar was 196. Mentation eventually started coming back around. Will continue to monitor.

## 2020-08-07 NOTE — Discharge Summary (Signed)
Physician Discharge Summary  Harold Hall AVW:[id number] DOB: [birth year] DOA: 08/04/2020  PCP: Alroy Dust, L.[name], MD  Admit date: 08/04/2020 Discharge date: 08/07/2020  Admitted From: Home Discharge disposition: Home with home health   Code Status: Prior  Diet Recommendation: Cardiac diet  Discharge Diagnosis:   Principal Problem:   COPD exacerbation ([place]) Active Problems:   Chronic respiratory failure with hypoxia ([place])   COPD (chronic obstructive pulmonary disease) ([place])   AKI (acute kidney injury) ([place])   History of Present Illness / Brief narrative:  Harold Hall is a 74 y.o. male with PMH of COPD on home oxygen, paroxysmal A. fib on Eliquis, left renal cell carcinoma status post nephrectomy, HTN, HLD, gout.  Patient presented to the ED on 08/04/2020 with progressively worsening shortness of breath and hypoxia for 1 week which he started after he believes he was exposed to some 'fumes/vapor' at work Patient follows up with pulmonologist Dr. Valeta Harms at the clinic. He has a previous 75pack-year smoking history. At baseline he uses 7 L of oxygen and his baseline pulse ox is between 85 and 95%.  His inhalers were recently changed. His baseline COPD regimen includes oxygen as above, BREZTRI,azithromycin Monday Wednesday Friday, Singulair and Roflumilast.  Reportedly, his exercise tolerance has recently dropped.Normally he could walk 30 feet before stopping to rest. Now he is able to walk only 10 feet.  He has a travel history to [facility] over a week ago. However he says he is good social distancing. He denies any other respiratory virus symptoms.   In the ED, oxygen saturation was 60% on arrival. Blood gas showed PCO2 of 80, which had been his baseline which is in 53s. Covid PCR and influenza A and B PCR negative. Chest x-ray suggested extensive pulmonary interstitial changesandchronic COPD changes. Patient refused BiPAP.  His oxygen saturation was maintained  15 L high flow oxygen by nasal cannula.  Subjective:  Seen and examined this morning.  Pleasant elderly Caucasian male.  Sitting up at the chair.  On 7 L oxygen by nasal cannula which is his home requirement.  Daughter at bedside. Labs this morning with improving creatinine at 2.04.  Patient wants to go home Hospital Course:  Acute on chronic hypoxic and hypercapnic respiratory failure -Likely secondary to COPD exacerbation.   -No evidence of pneumonia. -Pulmonary consultation appreciated.   -Clinically improving.  Currently on home requirement of 7 L oxygen. -Currently on Solu-Medrol 60 mg daily.  Will discharge him on tapering course of prednisone. -Continue nebulizers, Mucinex, incentive spirometry. -Continue azithromycin 250 mg MWF as before.  Advanced COPD with out of proportion hypoxemia -Associated with cor pulmonale. -High-resolution CT scan as an outpatient to rule out ILD. -According to pulmonology, patient has poor prognosis.  He would be a palliative care candidate.  However patient wants to continue aggressive care.  AKI on CKD 3b -Baseline creatinine 1.91 on July.  Creatinine up to 2.15 on 9/28. -Likely due to IV Lasix given in the ED.  Patient was also continued on his home medications which included on oral Lasix, HCTZ and losartan. -I held all of them this morning. -Repeat creatinine in the morning. Recent Labs    05/30/20 1940 08/04/20 1344 08/06/20 0316 08/07/20 0111  BUN 31* 29* 62* 73*  CREATININE 1.91* 1.66* 2.15* 2.04*   Recent Labs  Lab 08/04/20 1344 08/04/20 1437 08/04/20 1558 08/06/20 0316 08/07/20 0111  K 3.5 3.8 4.0 4.5 4.1  MG  --   --   --  2.8* 3.0*   Chronic diastolic CHF Essential hypertension -EF 65 to 70% with grade 1 diastolic dysfunction -Home meds include amlodipine 10 mg daily, Lasix 40 mg daily, losartan 100 mg daily, HCTZ 25 mg daily. -Medications as listed because of AKI.  Discussed with Dr. Einar Gip.   -Discharge on metoprolol  succinate 50 mg daily, amlodipine 10 mg daily, Lasix 40 mg daily and 40 mg daily as needed.  Keep losartan and HCTZ on hold.   -Continue to monitor blood pressure at home.  Follow-up with Dr. Einar Gip as an outpatient. -Recommend BMP on Friday 10/1.  Paroxysmal A. Fib -In sinus rhythm, continue flecainide and Eliquis  HLD -On statin  Gout -No symptoms or signs of acute flareup -Continue allopurinol.  Okay to discharge to home today.  Patient's wife at bedside. Wound care:    Discharge Exam:   Vitals:   08/06/20 2252 08/07/20 0201 08/07/20 0420 08/07/20 0742  BP: 140/70  (!) 149/64 (!) 143/66  Pulse: 76  80 69  Resp: 20  18 16   Temp: 98.4 F (36.9 C)  98.5 F (36.9 C)   TempSrc: Oral  Oral   SpO2: 90% 99% 95%   Weight:      Height:        Body mass index is 26.32 kg/m.  General exam: Appears calm and comfortable.  Not in physical distress Skin: No rashes, lesions or ulcers. HEENT: Atraumatic, normocephalic, supple neck, no obvious bleeding Lungs: Clear to auscultation bilaterally CVS: Regular rate and rhythm, no murmur GI/Abd soft, nontender, nondistended, bowel sound present CNS: , Alert, awake, oriented x3 Psychiatry: Mood appropriate Extremities: Trace bilateral pedal edema  Follow ups:   Discharge Instructions    Diet - low sodium heart healthy   Complete by: As directed    Increase activity slowly   Complete by: As directed       Follow-up Information    Lauraine Rinne, NP Follow up on 08/14/2020.   Specialty: Pulmonary Disease Why: Your appointment is at 10:30AM Contact information: [place] [address] [phone]        Alroy Dust, L.[name], MD Follow up.   Specialty: Family Medicine Contact information: [address] [place] [address] [phone]        Adrian Prows, MD Follow up.   Specialty: Cardiology Contact information: [place] [address] [phone]                 Recommendations for Outpatient Follow-Up:   1. Follow-up with PCP as an outpatient 2. Follow with cardiologist Dr. Einar Gip as an outpatient  Discharge Instructions:  Follow with Primary MD Alroy Dust, L.[name], MD in 7 days   Get CBC/BMP checked in next visit within 1 week by PCP or SNF MD ( we routinely change or add medications that can affect your baseline labs and fluid status, therefore we recommend that you get the mentioned basic workup next visit with your PCP, your PCP may decide not to get them or add new tests based on their clinical decision)  On your next visit with your PCP, please Get Medicines reviewed and adjusted.  Please request your PCP  to go over all Hospital Tests and Procedure/Radiological results at the follow up, please get all Hospital records sent to your Prim MD by signing hospital release before you go home.  Activity: As tolerated with Full fall precautions use walker/cane & assistance as needed  For Heart  failure patients - Check your Weight same time everyday, if you gain over 2 pounds, or you develop in leg swelling, experience more shortness of breath or chest pain, call your Primary MD immediately. Follow Cardiac Low Salt Diet and 1.5 lit/day fluid restriction.  If you have smoked or chewed Tobacco in the last 2 yrs please stop smoking, stop any regular Alcohol  and or any Recreational drug use.  If you experience worsening of your admission symptoms, develop shortness of breath, life threatening emergency, suicidal or homicidal thoughts you must seek medical attention immediately by calling 911 or calling your MD immediately  if symptoms less severe.  You Must read complete instructions/literature along with all the possible adverse reactions/side effects for all the Medicines you take and that have been prescribed to you. Take any new Medicines after you have completely understood and accpet all the possible adverse reactions/side effects.    Do not drive, operate heavy machinery, perform activities at heights, swimming or participation in water activities or provide baby sitting services if your were admitted for syncope or siezures until you have seen by Primary MD or a Neurologist and advised to do so again.  Do not drive when taking Pain medications.  Do not take more than prescribed Pain, Sleep and Anxiety Medications  Wear Seat belts while driving.   Please note You were cared for by a hospitalist during your hospital stay. If you have any questions about your discharge medications or the care you received while you were in the hospital after you are discharged, you can call the unit and asked to speak with the hospitalist on call if the hospitalist that took care of you is not available. Once you are discharged, your primary care physician will handle any further medical issues. Please note that NO REFILLS for any discharge medications will be authorized once you are discharged, as it is imperative that you return to your primary care physician (or establish a relationship with a primary care physician if you do not have one) for your aftercare needs so that they can reassess your need for medications and monitor your lab values.    Allergies as of 08/07/2020   No Known Allergies     Medication List    STOP taking these medications   losartan-hydrochlorothiazide 100-25 MG tablet Commonly known as: HYZAAR     TAKE these medications   albuterol 108 (90 Base) MCG/ACT inhaler Commonly known as: ProAir HFA INHALE 2 PUFFS BY MOUTH INTO THE LUNGS EVERY 4 HOURS AS NEEDED FOR WHEEZING OR SHORTNESS OF BREATH What changed:   how much to take  how to take this  when to take this  reasons to take this  additional instructions   allopurinol 300 MG tablet Commonly known as: ZYLOPRIM Take 300 mg by mouth daily.   ALPRAZolam 0.5 MG tablet Commonly known as: XANAX Take 1 tablet (0.5 mg total) by mouth 2 (two) times  daily. What changed: when to take this   amLODipine 10 MG tablet Commonly known as: NORVASC Take 10 mg by mouth daily.   atorvastatin 20 MG tablet Commonly known as: LIPITOR Take 20 mg by mouth daily.   azelastine 0.1 % nasal spray Commonly known as: ASTELIN Place 1 spray into both nostrils 2 (two) times daily.   azithromycin 250 MG tablet Commonly known as: ZITHROMAX Take Monday, Wednesday, Friday only with food. What changed:   how much to take  how to take this  when to take this  additional instructions   Breztri Aerosphere 160-9-4.8 MCG/ACT Aero Generic drug: Budeson-Glycopyrrol-Formoterol Inhale 2 puffs into the lungs in the morning and at bedtime. What changed: when to take this   budesonide-formoterol 160-4.5 MCG/ACT inhaler Commonly known as: SYMBICORT Inhale 2 puffs into the lungs 2 (two) times daily.   Daliresp 500 MCG Tabs tablet Generic drug: roflumilast TAKE 1 TABLET EVERY DAY What changed:   how much to take  when to take this   Eliquis 5 MG Tabs tablet Generic drug: apixaban TAKE 1 TABLET TWO TIMES DAILY What changed: See the new instructions.   fish oil-omega-3 fatty acids 1000 MG capsule Take 1 g by mouth 3 (three) times daily.   flecainide 100 MG tablet Commonly known as: TAMBOCOR TAKE 1  TABLET TWO TIMES DAILY What changed: See the new instructions.   Flutter Devi Use as directed   furosemide 40 MG tablet Commonly known as: LASIX TAKE 1 TABLET TWICE DAILY IN THE MORNING AND AT 3 PM AS NEEDED FOR FLUID What changed: See the new instructions.   ipratropium-albuterol 0.5-2.5 (3) MG/3ML Soln Commonly known as: DUONEB Use every 6-8 hours as needed for wheezing and SOB. What changed:   how much to take  how to take this  when to take this  reasons to take this  additional instructions   Linzess 145 MCG Caps capsule Generic drug: linaclotide Take 145 mcg by mouth daily before breakfast.   Magnesium 500 MG Tabs Take  500 mg by mouth at bedtime.   metoprolol succinate 50 MG 24 hr tablet Commonly known as: TOPROL-XL Take 50 mg by mouth daily. Take with or immediately following a meal.   montelukast 10 MG tablet Commonly known as: SINGULAIR TAKE 1 TABLET AT BEDTIME   multivitamin with minerals Tabs tablet Take 1 tablet by mouth daily.   nitroGLYCERIN 0.4 MG SL tablet Commonly known as: NITROSTAT Place 0.4 mg under the tongue every 5 (five) minutes as needed for chest pain.   nystatin 100000 UNIT/ML suspension Commonly known as: MYCOSTATIN Take 5 mLs (500,000 Units total) by mouth 4 (four) times daily. What changed:   when to take this  reasons to take this   One-A-Day Mens 50+ Tabs Take 1 tablet by mouth daily with breakfast.   OXYGEN Inhale 6-8 L/min into the lungs continuous. Inhale 6-7 L/min of oxygen continuously and increase to 8 L/min, if exerted   potassium chloride 10 MEQ tablet Commonly known as: KLOR-CON TAKE 1 TABLET TWO TIMES DAILY AS NEEDED WITH FUROSEMIDE What changed: See the new instructions.   predniSONE 10 MG tablet Commonly known as: DELTASONE Take 4 tablets (40 mg) daily for 2 days, then, Take 3 tablets (30 mg) daily for 2 days, then, Take 2 tablets (20 mg) daily for 2 days, then, Take 1 tablets (10 mg) daily for 1 days, then stop. What changed: additional instructions   rOPINIRole 0.5 MG tablet Commonly known as: REQUIP Take 1 tablet (0.5 mg total) by mouth daily. What changed: when to take this   sodium chloride HYPERTONIC 3 % nebulizer solution Take by nebulization as needed for other. What changed:   how much to take  when to take this  reasons to take this   sodium chloride HYPERTONIC 3 % nebulizer solution What changed: Another medication with the same name was changed. Make sure you understand how and when to take each.   Spiriva Respimat 2.5 MCG/ACT Aers Generic drug: Tiotropium Bromide Monohydrate Inhale 2 puffs into the lungs daily.  temazepam 15 MG capsule Commonly known as: RESTORIL Take 15 mg by mouth at bedtime as needed for sleep.   traMADol 50 MG tablet Commonly known as: ULTRAM TAKE 1 TABLET BY MOUTH THREE TIMES DAILY AS NEEDED FOR MODERATE PAIN What changed:   how much to take  how to take this  when to take this  additional instructions   vitamin C 500 MG tablet Commonly known as: ASCORBIC ACID Take 500 mg by mouth daily.   Vitamin D-3 25 MCG (1000 UT) Caps Take 1,000 Units by mouth at bedtime.       Time coordinating discharge: 35 minutes  The results of significant diagnostics from this hospitalization (including imaging, microbiology, ancillary and laboratory) are listed below for reference.    Procedures and Diagnostic Studies:   DG Chest Port 1 View  Result Date: 08/04/2020 CLINICAL DATA:  Shortness of breath. EXAM: PORTABLE CHEST 1 VIEW COMPARISON:  May 30, 2020 FINDINGS: Cardiomediastinal silhouette is normal. Mediastinal contours appear intact. There is no evidence of lobar consolidation. Advanced emphysema. Chronic interstitial coarsening, more prominent in the lower lobes. Osseous structures are without acute abnormality. Soft tissues are grossly normal. IMPRESSION: 1. Advanced emphysema and chronic interstitial lung changes. Superimposed interstitial pulmonary edema or early consolidation is difficult to exclude. 2. No evidence of lobar consolidation. Electronically Signed   By: Fidela Salisbury M.D.   On: 08/04/2020 14:40   ECHOCARDIOGRAM COMPLETE  Result Date: 08/05/2020    ECHOCARDIOGRAM REPORT   Patient Name:   MAKENA MCGRADY Date of Exam: 08/05/2020 Medical Rec #:  [id number]         Height:       74.0 in Accession #:    [id number]        Weight:       205.0 lb Date of Birth:  08-Oct-1946        BSA:          2.196 m Patient Age:    37 years          BP:           122/63 mmHg Patient Gender: M                 HR:           83 bpm. Exam Location:  Inpatient Procedure: 2D Echo  Indications:    518.82 acute respiratory insufficiency  History:        Patient has prior history of Echocardiogram examinations, most                 recent 08/08/2019. COPD; Risk Factors:Hypertension, Dyslipidemia                 and Former Smoker.  Sonographer:    Jannett Celestine RDCS (AE) Referring Phys: [place]  1. Left ventricular ejection fraction, by estimation, is 65 to 70%. The left ventricle has normal function. The left ventricle has no regional wall motion abnormalities. Left ventricular diastolic parameters are consistent with Grade I diastolic dysfunction (impaired relaxation).  2. Right ventricular systolic function is normal. The right ventricular size is normal.  3. Left atrial size was mildly dilated.  4. The mitral valve is normal in structure. No evidence of mitral valve regurgitation. No evidence of mitral stenosis.  5. The aortic valve is normal in structure. Aortic valve regurgitation is not visualized. No aortic stenosis is present.  6. The inferior vena cava is normal in size with greater than 50%  respiratory variability, suggesting right atrial pressure of 3 mmHg. FINDINGS  Left Ventricle: Left ventricular ejection fraction, by estimation, is 65 to 70%. The left ventricle has normal function. The left ventricle has no regional wall motion abnormalities. The left ventricular internal cavity size was normal in size. There is  no left ventricular hypertrophy. Left ventricular diastolic parameters are consistent with Grade I diastolic dysfunction (impaired relaxation). Normal left ventricular filling pressure. Right Ventricle: The right ventricular size is normal. No increase in right ventricular wall thickness. Right ventricular systolic function is normal. Left Atrium: Left atrial size was mildly dilated. Right Atrium: Right atrial size was normal in size. Pericardium: Trivial pericardial effusion is present. Mitral Valve: The mitral valve is normal in structure. No  evidence of mitral valve regurgitation. No evidence of mitral valve stenosis. Tricuspid Valve: The tricuspid valve is normal in structure. Tricuspid valve regurgitation is mild . No evidence of tricuspid stenosis. Aortic Valve: The aortic valve is normal in structure. Aortic valve regurgitation is not visualized. No aortic stenosis is present. Pulmonic Valve: The pulmonic valve was normal in structure. Pulmonic valve regurgitation is not visualized. No evidence of pulmonic stenosis. Aorta: The aortic root is normal in size and structure. Venous: The inferior vena cava is normal in size with greater than 50% respiratory variability, suggesting right atrial pressure of 3 mmHg. IAS/Shunts: No atrial level shunt detected by color flow Doppler.  LEFT VENTRICLE PLAX 2D LVIDd:         5.30 cm LVIDs:         3.20 cm LV PW:         1.10 cm LV IVS:        1.00 cm LVOT diam:     2.30 cm LVOT Area:     4.15 cm  RIGHT VENTRICLE RV S prime:     16.00 cm/s TAPSE (M-mode): 3.1 cm LEFT ATRIUM             Index       RIGHT ATRIUM           Index LA diam:        4.30 cm 1.96 cm/m  RA Area:     24.60 cm LA Vol (A2C):   71.6 ml 32.60 ml/m RA Volume:   65.10 ml  29.64 ml/m LA Vol (A4C):   60.2 ml 27.41 ml/m LA Biplane Vol: 65.7 ml 29.91 ml/m   AORTA Ao Root diam: 3.20 cm  SHUNTS Systemic Diam: 2.30 cm Ena Dawley MD Electronically signed by Ena Dawley MD Signature Date/Time: 08/05/2020/12:20:38 PM    Final    VAS [place] LOWER EXTREMITY VENOUS (DVT)  Result Date: 08/05/2020  Lower Venous DVTStudy Indications: SOB, and Edema.  Comparison Study: No prior studies. Performing Technologist: Darlin Coco  Examination Guidelines: A complete evaluation includes B-mode imaging, spectral Doppler, color Doppler, and power Doppler as needed of all accessible portions of each vessel. Bilateral testing is considered an integral part of a complete examination. Limited examinations for reoccurring indications may be performed as noted. The  reflux portion of the exam is performed with the patient in reverse Trendelenburg.  +---------+---------------+---------+-----------+----------+--------------+ RIGHT    CompressibilityPhasicitySpontaneityPropertiesThrombus Aging +---------+---------------+---------+-----------+----------+--------------+ CFV      Full           Yes      Yes                                 +---------+---------------+---------+-----------+----------+--------------+  SFJ      Full                                                        +---------+---------------+---------+-----------+----------+--------------+ FV Prox  Full                                                        +---------+---------------+---------+-----------+----------+--------------+ FV Mid   Full                                                        +---------+---------------+---------+-----------+----------+--------------+ FV DistalFull                                                        +---------+---------------+---------+-----------+----------+--------------+ PFV      Full                                                        +---------+---------------+---------+-----------+----------+--------------+ POP      Full           Yes      Yes                                 +---------+---------------+---------+-----------+----------+--------------+ PTV      Full                                                        +---------+---------------+---------+-----------+----------+--------------+ PERO     Full                                                        +---------+---------------+---------+-----------+----------+--------------+   +---------+---------------+---------+-----------+----------+--------------+ LEFT     CompressibilityPhasicitySpontaneityPropertiesThrombus Aging +---------+---------------+---------+-----------+----------+--------------+ CFV      Full           Yes       Yes                                 +---------+---------------+---------+-----------+----------+--------------+ SFJ      Full                                                        +---------+---------------+---------+-----------+----------+--------------+  FV Prox  Full                                                        +---------+---------------+---------+-----------+----------+--------------+ FV Mid   Full                                                        +---------+---------------+---------+-----------+----------+--------------+ FV DistalFull                                                        +---------+---------------+---------+-----------+----------+--------------+ PFV      Full                                                        +---------+---------------+---------+-----------+----------+--------------+ POP      Full           Yes      Yes                                 +---------+---------------+---------+-----------+----------+--------------+ PTV      Full                                                        +---------+---------------+---------+-----------+----------+--------------+ PERO     Full                                                        +---------+---------------+---------+-----------+----------+--------------+     Summary: RIGHT: - There is no evidence of deep vein thrombosis in the lower extremity.  - No cystic structure found in the popliteal fossa.  LEFT: - There is no evidence of deep vein thrombosis in the lower extremity.  - No cystic structure found in the popliteal fossa.  *See table(s) above for measurements and observations. Electronically signed by Ruta Hinds MD on 08/05/2020 at 2:55:12 PM.    Final      Labs:   Basic Metabolic Panel: Recent Labs  Lab 08/04/20 1344 08/04/20 1344 08/04/20 1437 08/04/20 1437 08/04/20 1558 08/04/20 1558 08/06/20 0316 08/07/20 0111  NA 141  --  141  --   142  --  138 140  K 3.5   < > 3.8   < > 4.0   < > 4.5 4.1  CL 90*  --   --   --   --   --  90* 93*  CO2 41*  --   --   --   --   --  36* 37*  GLUCOSE 214*  --   --   --   --   --  169* 233*  BUN 29*  --   --   --   --   --  62* 73*  CREATININE 1.66*  --   --   --   --   --  2.15* 2.04*  CALCIUM 9.2  --   --   --   --   --  9.4 9.3  MG  --   --   --   --   --   --  2.8* 3.0*   < > = values in this interval not displayed.   GFR Estimated Creatinine Clearance: 37.5 mL/min (A) (by C-G formula based on SCr of 2.04 mg/dL (H)). Liver Function Tests: Recent Labs  Lab 08/04/20 1344  AST 17  ALT 18  ALKPHOS 70  BILITOT 0.9  PROT 6.5  ALBUMIN 3.2*   No results for input(s): LIPASE, AMYLASE in the last 168 hours. No results for input(s): AMMONIA in the last 168 hours. Coagulation profile No results for input(s): INR, PROTIME in the last 168 hours.  CBC: Recent Labs  Lab 08/04/20 1344 08/04/20 1437 08/04/20 1558 08/06/20 0316 08/07/20 0111  WBC 7.4  --   --  11.3* 10.0  NEUTROABS 5.5  --   --  10.4* 8.3*  HGB 10.6* 11.6* 11.9* 10.7* 9.8*  HCT 37.8* 34.0* 35.0* 36.8* 34.2*  MCV 87.9  --   --  84.0 84.2  PLT 211  --   --  271 274   Cardiac Enzymes: No results for input(s): CKTOTAL, CKMB, CKMBINDEX, TROPONINI in the last 168 hours. BNP: Invalid input(s): POCBNP CBG: No results for input(s): GLUCAP in the last 168 hours. D-Dimer No results for input(s): DDIMER in the last 72 hours. Hgb A1c No results for input(s): HGBA1C in the last 72 hours. Lipid Profile No results for input(s): CHOL, HDL, LDLCALC, TRIG, CHOLHDL, LDLDIRECT in the last 72 hours. Thyroid function studies No results for input(s): TSH, T4TOTAL, T3FREE, THYROIDAB in the last 72 hours.  Invalid input(s): FREET3 Anemia work up No results for input(s): VITAMINB12, FOLATE, FERRITIN, TIBC, IRON, RETICCTPCT in the last 72 hours. Microbiology Recent Results (from the past 240 hour(s))  Respiratory Panel by RT PCR  (Flu A&B, Covid) - Nasopharyngeal Swab     Status: None   Collection Time: 08/04/20  2:43 PM   Specimen: Nasopharyngeal Swab  Result Value Ref Range Status   SARS Coronavirus 2 by RT PCR NEGATIVE NEGATIVE Final    Comment: (NOTE) SARS-CoV-2 target nucleic acids are NOT DETECTED.  The SARS-CoV-2 RNA is generally detectable in upper respiratoy specimens during the acute phase of infection. The lowest concentration of SARS-CoV-2 viral copies this assay can detect is 131 copies/mL. A negative result does not preclude SARS-Cov-2 infection and should not be used as the sole basis for treatment or other patient management decisions. A negative result may occur with  improper specimen collection/handling, submission of specimen other than nasopharyngeal swab, presence of viral mutation(s) within the areas targeted by this assay, and inadequate number of viral copies (<131 copies/mL). A negative result must be combined with clinical observations, patient history, and epidemiological information. The expected result is Negative.  Fact Sheet for Patients:  PinkCheek.be  Fact Sheet for Healthcare Providers:  GravelBags.it  This test is no t yet approved or cleared by the [place] FDA and  has been authorized for detection and/or diagnosis of  SARS-CoV-2 by FDA under an Emergency Use Authorization (EUA). This EUA will remain  in effect (meaning this test can be used) for the duration of the COVID-19 declaration under Section 564(b)(1) of the Act, 21 U.S.C. section 360bbb-3(b)(1), unless the authorization is terminated or revoked sooner.     Influenza A by PCR NEGATIVE NEGATIVE Final   Influenza B by PCR NEGATIVE NEGATIVE Final    Comment: (NOTE) The Xpert Xpress SARS-CoV-2/FLU/RSV assay is intended as an aid in  the diagnosis of influenza from Nasopharyngeal swab specimens and  should not be used as a sole basis for treatment.  Nasal washings and  aspirates are unacceptable for Xpert Xpress SARS-CoV-2/FLU/RSV  testing.  Fact Sheet for Patients: PinkCheek.be  Fact Sheet for Healthcare Providers: GravelBags.it  This test is not yet approved or cleared by the [place] FDA and  has been authorized for detection and/or diagnosis of SARS-CoV-2 by  FDA under an Emergency Use Authorization (EUA). This EUA will remain  in effect (meaning this test can be used) for the duration of the  Covid-19 declaration under Section 564(b)(1) of the Act, 21  U.S.C. section 360bbb-3(b)(1), unless the authorization is  terminated or revoked. Performed at [facility], [place] [address], [place], [place] [address]   Respiratory Panel by PCR     Status: None   Collection Time: 08/04/20  9:09 PM   Specimen: Nasopharyngeal Swab; Respiratory  Result Value Ref Range Status   Adenovirus NOT DETECTED NOT DETECTED Final   Coronavirus 229E NOT DETECTED NOT DETECTED Final    Comment: (NOTE) The Coronavirus on the Respiratory Panel, DOES NOT test for the novel  Coronavirus (2019 nCoV)    Coronavirus HKU1 NOT DETECTED NOT DETECTED Final   Coronavirus NL63 NOT DETECTED NOT DETECTED Final   Coronavirus OC43 NOT DETECTED NOT DETECTED Final   Metapneumovirus NOT DETECTED NOT DETECTED Final   Rhinovirus / Enterovirus NOT DETECTED NOT DETECTED Final   Influenza A NOT DETECTED NOT DETECTED Final   Influenza B NOT DETECTED NOT DETECTED Final   Parainfluenza Virus 1 NOT DETECTED NOT DETECTED Final   Parainfluenza Virus 2 NOT DETECTED NOT DETECTED Final   Parainfluenza Virus 3 NOT DETECTED NOT DETECTED Final   Parainfluenza Virus 4 NOT DETECTED NOT DETECTED Final   Respiratory Syncytial Virus NOT DETECTED NOT DETECTED Final   Bordetella pertussis NOT DETECTED NOT DETECTED Final   Chlamydophila pneumoniae NOT DETECTED NOT DETECTED Final   Mycoplasma pneumoniae NOT DETECTED NOT  DETECTED Final    Comment: Performed at [facility], [place]. [address], [place], [place] [address]     Signed: Terrilee Croak  [facility] 08/07/2020, 10:54 AM

## 2020-08-08 ENCOUNTER — Telehealth: Payer: Self-pay

## 2020-08-08 LAB — ALPHA-1 ANTITRYPSIN PHENOTYPE: A-1 Antitrypsin, Ser: 183 mg/dL (ref 101–187)

## 2020-08-08 NOTE — Telephone Encounter (Signed)
Location of hospitalization: [facility] Reason for hospitalization: SOB Date of discharge: 08/07/2020 Date of first communication with patient: today Person contacting patient: Me Current symptoms: none Do you understand why you were in the Hospital: Yes Questions regarding discharge instructions: None Where were you discharged to: Home Medications reviewed: Yes Allergies reviewed: Yes Dietary changes reviewed: Yes. Discussed low fat and low salt diet.  Referals reviewed: NA Activities of Daily Living: Able to with mild limitations Any transportation issues/concerns: None Any patient concerns: None Confirmed importance & date/time of Follow up appt: Yes Confirmed with patient if condition begins to worsen call. Pt was given the office number and encouraged to call back with questions or concerns: Yes

## 2020-08-08 NOTE — Telephone Encounter (Signed)
Spoke with patient's wife. She is frustrated that patient is expected to go to several doctor appointments after being in the hospital. Discussed with her the benefits of follow up closely in our office after a hospitalization, however patient and his wife prefer to prioritize visits to his PCP as he was hospitalized for COPD exacerbation, not cardiac diagnoses. They would like to cancel patient's TOC appointment on 08/15/2020 and keep his regular follow up on 09/02/2020.   Will obtain BNP from PCP office.

## 2020-08-09 LAB — URINE DRUGS OF ABUSE SCREEN W ALC, ROUTINE (REF LAB)
Amphetamines, Urine: NEGATIVE ng/mL
Barbiturate, Ur: NEGATIVE ng/mL
Cannabinoid Quant, Ur: NEGATIVE ng/mL
Cocaine (Metab.): NEGATIVE ng/mL
Ethanol U, Quan: NEGATIVE %
Methadone Screen, Urine: NEGATIVE ng/mL
Opiate Quant, Ur: NEGATIVE ng/mL
Phencyclidine, Ur: NEGATIVE ng/mL
Propoxyphene, Urine: NEGATIVE ng/mL

## 2020-08-09 LAB — DRUG PROFILE 799031
BENZODIAZEPINES: POSITIVE — AB
HYDROXYALPRAZOLAM: NEGATIVE
NORDIAZEPAM: NEGATIVE
Oxazepam Gc/Ms Conf, Ur: 356 ng/mL
Oxazepam: POSITIVE — AB

## 2020-08-12 ENCOUNTER — Telehealth: Payer: Self-pay

## 2020-08-12 DIAGNOSIS — N1832 Chronic kidney disease, stage 3b: Secondary | ICD-10-CM | POA: Diagnosis not present

## 2020-08-12 DIAGNOSIS — R609 Edema, unspecified: Secondary | ICD-10-CM | POA: Diagnosis not present

## 2020-08-12 DIAGNOSIS — I10 Essential (primary) hypertension: Secondary | ICD-10-CM | POA: Diagnosis not present

## 2020-08-12 DIAGNOSIS — J449 Chronic obstructive pulmonary disease, unspecified: Secondary | ICD-10-CM | POA: Diagnosis not present

## 2020-08-12 NOTE — Telephone Encounter (Signed)
Calledpt wife answered and inform her to let PCP send [place] his lab results, pt wife understood

## 2020-08-12 NOTE — Telephone Encounter (Signed)
-----   Message from Alethia Berthold, [place] sent at 08/08/2020  5:07 PM EDT ----- Regarding: BNP from PCP Patient is going to cancel his 08/15/2020 visit and only come for his 09/02/2020 visit. However, I want to order a BNP. He is going to get lab work done at his PCP, are you able to contact his PCP and make sure they order a BNP and share results with our office?   Thank you!

## 2020-08-14 ENCOUNTER — Other Ambulatory Visit: Payer: Self-pay

## 2020-08-14 ENCOUNTER — Encounter: Payer: Self-pay | Admitting: Pulmonary Disease

## 2020-08-14 ENCOUNTER — Ambulatory Visit (INDEPENDENT_AMBULATORY_CARE_PROVIDER_SITE_OTHER): Payer: Medicare Other | Admitting: Pulmonary Disease

## 2020-08-14 VITALS — BP 138/60 | HR 66 | Temp 97.6°F | Ht 74.0 in | Wt 317.0 lb

## 2020-08-14 DIAGNOSIS — I5032 Chronic diastolic (congestive) heart failure: Secondary | ICD-10-CM

## 2020-08-14 DIAGNOSIS — N179 Acute kidney failure, unspecified: Secondary | ICD-10-CM

## 2020-08-14 DIAGNOSIS — J9611 Chronic respiratory failure with hypoxia: Secondary | ICD-10-CM

## 2020-08-14 DIAGNOSIS — J432 Centrilobular emphysema: Secondary | ICD-10-CM

## 2020-08-14 DIAGNOSIS — I480 Paroxysmal atrial fibrillation: Secondary | ICD-10-CM | POA: Diagnosis not present

## 2020-08-14 MED ORDER — ALBUTEROL SULFATE HFA 108 (90 BASE) MCG/ACT IN AERS: 2.0000 | INHALATION_SPRAY | RESPIRATORY_TRACT | 2 refills | Status: DC | PRN

## 2020-08-14 NOTE — Assessment & Plan Note (Signed)
Plan: Keep follow-up with Dr. Einar Gip later on this month Continue current medications

## 2020-08-14 NOTE — Assessment & Plan Note (Signed)
Recent COPD exacerbation/chronic hypoxemic respiratory failure Discussed potential option of starting daily prednisone, patient declined We will work on aggressively managing congestion  Plan: Continue Breztri' Continue Daliresp 500 mcg Continue Monday Wednesday Friday azithromycin Continue noninvasive ventilator given hypoxemia and hypercapnia Continue hypertonic saline nebs twice daily Continue DuoNeb nebulized meds every 6-8 hours as needed for shortness of breath or wheezing Continue Astelin nasal spray Continue Flonase nasal spray Start nasal saline rinses 4-week follow-up with our office

## 2020-08-14 NOTE — Assessment & Plan Note (Signed)
Plan: Keep follow-up with primary care and cardiology

## 2020-08-14 NOTE — Assessment & Plan Note (Signed)
Plan: Continue current medications Keep follow-up with Dr. Einar Gip

## 2020-08-14 NOTE — Assessment & Plan Note (Signed)
Plan: Continue oxygen therapy Continue noninvasive ventilator to the best your ability, goal is 4 hours of use a day Contacted [employer] for compliance report on NIV

## 2020-08-14 NOTE — Progress Notes (Signed)
@Patient  ID: Harold Hall, male    DOB: [birth year], 74 y.o.   MRN: [id number]  Chief Complaint  Patient presents with  . Hospitalization Follow-up    Breathing has improved since hospital d/c. He has cough with clear sputum. He is using albuterol inhaler 2 x per day on average.     Referring provider: Alroy Dust, L.[name], MD  HPI:  74 year old male former smoker followed in our office for COPD  PMH: Abdominal pain, restless leg syndrome, A. fib, edema Smoker/ Smoking History: Former Smoker.  Quit 2012.  75-pack-year smoking history. Maintenance:  Thayer Jew, MWF azithromycin  Pt of: Dr. Valeta Harms  08/14/2020  - Visit   74 year old male former smoker followed in our office for COPD and chronic respiratory failure.  Patient presenting today as a hospital follow-up.  He was admitted to the hospital 08/04/2020 and discharged on 08/07/2020.  He was treated for a COPD exacerbation as well as chronic hypoxemic respiratory failure.  Patient's PCO2 on arrival to the hospital is 80.  Discharge summary listed below:  PCP: Alroy Dust, L.[name], MD  Admit date: 08/04/2020 Discharge date: 08/07/2020  Admitted From: Home Discharge disposition: Home with home health   Code Status: Prior  Diet Recommendation: Cardiac diet  Discharge Diagnosis:   Principal Problem:   COPD exacerbation ([place]) Active Problems:   Chronic respiratory failure with hypoxia ([place])   COPD (chronic obstructive pulmonary disease) ([place])   AKI (acute kidney injury) ([place])   History of Present Illness / Brief narrative:  Harold Kargbo Morrowis a 74 y.o.malewith PMH of COPD on home oxygen, paroxysmal A. fib on Eliquis, left renal cell carcinoma status post nephrectomy, HTN, HLD, gout.  Patient presented to the ED on9/26/2021with progressively worsening shortness of breath and hypoxia for 1 week which he started after he believes he was exposed to some 'fumes/vapor' at work Patient follows up with pulmonologist Dr.  Valeta Harms at the clinic. He has a previous 75pack-year smoking history. At baseline he uses 7 L of oxygen and his baseline pulse ox is between 85 and 95%. His inhalers were recently changed. His baseline COPD regimen includes oxygen as above, BREZTRI,azithromycin Monday Wednesday Friday, Singulair and Roflumilast.  Reportedly, his exercise tolerance has recently dropped.Normally he could walk 30 feet before stopping to rest. Now he is able to walk only 10 feet.  He has a travel history to [facility] over a week ago. However he says he is good social distancing. He denies any other respiratory virus symptoms.   In the ED, oxygen saturation was 60% on arrival. Blood gas showed PCO2 of 80, which had been his baseline which is in 37s. Covid PCR and influenza A and B PCR negative. Chest x-ray suggested extensive pulmonary interstitial changesandchronic COPD changes. Patient refused BiPAP. His oxygen saturation was maintained 15 L high flow oxygen by nasal cannula.  Subjective:  Seen and examined this morning.  Pleasant elderly Caucasian male.  Sitting up at the chair.  On 7 L oxygen by nasal cannula which is his home requirement.  Daughter at bedside. Labs this morning with improving creatinine at 2.04.  Patient wants to go home Hospital Course:  Acute on chronic hypoxic and hypercapnic respiratory failure -Likely secondary to COPD exacerbation.  -No evidence of pneumonia. -Pulmonary consultation appreciated. -Clinically improving. Currently on home requirement of 7 L oxygen. -Currently on Solu-Medrol 60 mg daily.Will discharge him on tapering course of prednisone. -Continue nebulizers, Mucinex, incentive spirometry. -Continue azithromycin 250 mg MWF as before.  Advanced COPD  with out of proportion hypoxemia -Associated with cor pulmonale. -High-resolution CTscanas an outpatientto rule out ILD. -According topulmonology, patient has poor prognosis. He would be a  palliative care candidate. However patient wants to continue aggressive care.  AKI on CKD 3b -Baseline creatinine 1.91 on July. Creatinine up to 2.15 on 9/28. -Likely due to IV Lasix given in the ED. Patient was also continued on his home medications which included on oral Lasix, HCTZ and losartan. -I held all of them this morning. -Repeat creatinine in the morning. Recent Labs (within last 365 days)        Recent Labs    05/30/20 1940 08/04/20 1344 08/06/20 0316 08/07/20 0111  BUN 31* 29* 62* 73*  CREATININE 1.91* 1.66* 2.15* 2.04*     Last Labs          Recent Labs  Lab 08/04/20 1344 08/04/20 1437 08/04/20 1558 08/06/20 0316 08/07/20 0111  K 3.5 3.8 4.0 4.5 4.1  MG  --   --   --  2.8* 3.0*     Chronic diastolic CHF Essential hypertension -EF 65 to 70% with grade 1 diastolic dysfunction -Home meds include amlodipine 10 mg daily, Lasix 40 mg daily, losartan 100 mg daily, HCTZ 25 mg daily. -Medications as listed because of AKI.  Discussed with Dr. Einar Gip.   -Discharge on metoprolol succinate 50 mg daily, amlodipine 10 mg daily, Lasix 40 mg daily and 40 mg daily as needed.  Keep losartan and HCTZ on hold.   -Continue to monitor blood pressure at home.  Follow-up with Dr. Einar Gip as an outpatient. -Recommend BMP on Friday 10/1.  Paroxysmal A. Fib -In sinus rhythm, continueflecainide andEliquis  HLD -On statin  Gout -No symptoms or signs of acute flareup -Continue allopurinol.  Patient reporting today that he is doing better since being discharged in the hospital.  He is finishing his prednisone taper today.  He is requesting a refill of his rescue inhaler.  Patient admits that he has been struggling with compliance of using his noninvasive ventilator as it makes him feel claustrophobic.  We will discuss this today.  Patient is frustrated regarding his recent hospitalization.  He feels that he is getting placed in multiple directions.  We will try our best to  help clarify this today.  He has also had recent blood work from primary care this is been routed to cardiology.  Patient has follow-up with cardiology later on this month Dr. Einar Gip.   Questionaires / Pulmonary Flowsheets:   ACT:  No flowsheet data found.  MMRC: mMRC Dyspnea Scale mMRC Score  08/14/2020 3  07/03/2020 3  06/06/2020 3    Epworth:  No flowsheet data found.  Tests:   Imaging: December 2017 chest CT images showing significant emphysema with an upper lobe predominance, no clear interstitial lung disease.  01/18/2020-CT chest without contrast-no acute process or evidence of metastatic disease in the chest, advanced bullous type emphysema with biapical pleural-parenchymal scarring, pulmonary artery enlargement suggesting PAH, coronary artery sclerosis, aortic arthrosclerosis  Blood: 09/2016 IgE 4 08/15/2018 Hgb 16.9 mg/dL  Echo: 08/15/2018 LVEF > 55%, mod LVH, dilated left atrial, normal RV, no shunt 08/07/2019-echocardiogram-LV ejection fraction 55 to 62%, grade 1 diastolic dysfunction,  PFT: Spiro 2007:  FEV1 2.10 (46%), ratio 47 PFT 2017 Ratio 47%, FEV1 1.76L (46% pred), FVC 3.78L, TLC 6.45L (82% pred), DLCO 13.18  05/30/2020-ABG-pH 7.4, PCO2 60.7, PO2 48, bicarb 40, 05/30/2020-chest x-ray-stable emphysema with diffuse scarring and fibrosis, no acute airspace disease   FENO:  No results found for: NITRICOXIDE  PFT: PFT Results Latest Ref Rng & Units 04/22/2016  FVC-Pre L 3.58  FVC-Predicted Pre % 69  FVC-Post L 3.78  FVC-Predicted Post % 73  Pre FEV1/FVC % % 49  Post FEV1/FCV % % 47  FEV1-Pre L 1.77  FEV1-Predicted Pre % 46  FEV1-Post L 1.76  DLCO uncorrected ml/min/mmHg 13.18  DLCO UNC% % 34  DLCO corrected ml/min/mmHg 12.67  DLCO COR %Predicted % 33  DLVA Predicted % 48  TLC L 6.45  TLC % Predicted % 82  RV % Predicted % 93    WALK:  SIX MIN WALK 03/27/2020 06/23/2018 03/30/2017 07/08/2011  Medications - none - -  Supplimental Oxygen during Test?  (L/min) Yes Yes Yes No  O2 Flow Rate 8 15 5  -  Type Continuous Continuous Pulse -  Laps - 17 - -  Partial Lap (in Meters) - 8 - -  Baseline Heartrate - 71 - -  Baseline Dyspnea (Borg Scale) - 3 - -  Baseline Fatigue (Borg Scale) - 3 - -  Baseline SPO2 - 88 - -  Heartrate - 90 - -  Dyspnea (Borg Scale) - 4 - -  Fatigue (Borg Scale) - 4 - -  SPO2 - 86 - -  Heartrate - 76 - -  SPO2 - 90 - -  Stopped or Paused before Six Minutes - Yes - -  Other Symptoms at end of Exercise -   - -  Interpretation - Dizziness - -  Distance Completed - 824 - -  Tech Comments: Completed 1 lap desat. on 6 L to 85%, increased oxygen to 8L 90%. Patient at 2 mins was 77% on 4L; and at 32mins in walk was on 15L at 80% pt walked 1 lap at a moderate pace, did not complete laps 2 and 3 d/t sob and dropping 02 sats.  -    Imaging: DG Chest Port 1 View  Result Date: 08/04/2020 CLINICAL DATA:  Shortness of breath. EXAM: PORTABLE CHEST 1 VIEW COMPARISON:  May 30, 2020 FINDINGS: Cardiomediastinal silhouette is normal. Mediastinal contours appear intact. There is no evidence of lobar consolidation. Advanced emphysema. Chronic interstitial coarsening, more prominent in the lower lobes. Osseous structures are without acute abnormality. Soft tissues are grossly normal. IMPRESSION: 1. Advanced emphysema and chronic interstitial lung changes. Superimposed interstitial pulmonary edema or early consolidation is difficult to exclude. 2. No evidence of lobar consolidation. Electronically Signed   By: Fidela Salisbury M.D.   On: 08/04/2020 14:40   ECHOCARDIOGRAM COMPLETE  Result Date: 08/05/2020    ECHOCARDIOGRAM REPORT   Patient Name:   HALE CHALFIN Date of Exam: 08/05/2020 Medical Rec #:  [id number]         Height:       74.0 in Accession #:    [id number]        Weight:       205.0 lb Date of Birth:  [birth year]        BSA:          2.196 m Patient Age:    27 years          BP:           122/63 mmHg Patient Gender: M                  HR:           83 bpm. Exam Location:  Inpatient Procedure: 2D Echo Indications:    518.82 acute  respiratory insufficiency  History:        Patient has prior history of Echocardiogram examinations, most                 recent 08/08/2019. COPD; Risk Factors:Hypertension, Dyslipidemia                 and Former Smoker.  Sonographer:    Jannett Celestine RDCS (AE) Referring Phys: [place]  1. Left ventricular ejection fraction, by estimation, is 65 to 70%. The left ventricle has normal function. The left ventricle has no regional wall motion abnormalities. Left ventricular diastolic parameters are consistent with Grade I diastolic dysfunction (impaired relaxation).  2. Right ventricular systolic function is normal. The right ventricular size is normal.  3. Left atrial size was mildly dilated.  4. The mitral valve is normal in structure. No evidence of mitral valve regurgitation. No evidence of mitral stenosis.  5. The aortic valve is normal in structure. Aortic valve regurgitation is not visualized. No aortic stenosis is present.  6. The inferior vena cava is normal in size with greater than 50% respiratory variability, suggesting right atrial pressure of 3 mmHg. FINDINGS  Left Ventricle: Left ventricular ejection fraction, by estimation, is 65 to 70%. The left ventricle has normal function. The left ventricle has no regional wall motion abnormalities. The left ventricular internal cavity size was normal in size. There is  no left ventricular hypertrophy. Left ventricular diastolic parameters are consistent with Grade I diastolic dysfunction (impaired relaxation). Normal left ventricular filling pressure. Right Ventricle: The right ventricular size is normal. No increase in right ventricular wall thickness. Right ventricular systolic function is normal. Left Atrium: Left atrial size was mildly dilated. Right Atrium: Right atrial size was normal in size. Pericardium: Trivial pericardial effusion  is present. Mitral Valve: The mitral valve is normal in structure. No evidence of mitral valve regurgitation. No evidence of mitral valve stenosis. Tricuspid Valve: The tricuspid valve is normal in structure. Tricuspid valve regurgitation is mild . No evidence of tricuspid stenosis. Aortic Valve: The aortic valve is normal in structure. Aortic valve regurgitation is not visualized. No aortic stenosis is present. Pulmonic Valve: The pulmonic valve was normal in structure. Pulmonic valve regurgitation is not visualized. No evidence of pulmonic stenosis. Aorta: The aortic root is normal in size and structure. Venous: The inferior vena cava is normal in size with greater than 50% respiratory variability, suggesting right atrial pressure of 3 mmHg. IAS/Shunts: No atrial level shunt detected by color flow Doppler.  LEFT VENTRICLE PLAX 2D LVIDd:         5.30 cm LVIDs:         3.20 cm LV PW:         1.10 cm LV IVS:        1.00 cm LVOT diam:     2.30 cm LVOT Area:     4.15 cm  RIGHT VENTRICLE RV S prime:     16.00 cm/s TAPSE (M-mode): 3.1 cm LEFT ATRIUM             Index       RIGHT ATRIUM           Index LA diam:        4.30 cm 1.96 cm/m  RA Area:     24.60 cm LA Vol (A2C):   71.6 ml 32.60 ml/m RA Volume:   65.10 ml  29.64 ml/m LA Vol (A4C):   60.2 ml 27.41 ml/m LA Biplane Vol:  65.7 ml 29.91 ml/m   AORTA Ao Root diam: 3.20 cm  SHUNTS Systemic Diam: 2.30 cm Ena Dawley MD Electronically signed by Ena Dawley MD Signature Date/Time: 08/05/2020/12:20:38 PM    Final    VAS [place] LOWER EXTREMITY VENOUS (DVT)  Result Date: 08/05/2020  Lower Venous DVTStudy Indications: SOB, and Edema.  Comparison Study: No prior studies. Performing Technologist: Darlin Coco  Examination Guidelines: A complete evaluation includes B-mode imaging, spectral Doppler, color Doppler, and power Doppler as needed of all accessible portions of each vessel. Bilateral testing is considered an integral part of a complete examination. Limited  examinations for reoccurring indications may be performed as noted. The reflux portion of the exam is performed with the patient in reverse Trendelenburg.  +---------+---------------+---------+-----------+----------+--------------+ RIGHT    CompressibilityPhasicitySpontaneityPropertiesThrombus Aging +---------+---------------+---------+-----------+----------+--------------+ CFV      Full           Yes      Yes                                 +---------+---------------+---------+-----------+----------+--------------+ SFJ      Full                                                        +---------+---------------+---------+-----------+----------+--------------+ FV Prox  Full                                                        +---------+---------------+---------+-----------+----------+--------------+ FV Mid   Full                                                        +---------+---------------+---------+-----------+----------+--------------+ FV DistalFull                                                        +---------+---------------+---------+-----------+----------+--------------+ PFV      Full                                                        +---------+---------------+---------+-----------+----------+--------------+ POP      Full           Yes      Yes                                 +---------+---------------+---------+-----------+----------+--------------+ PTV      Full                                                        +---------+---------------+---------+-----------+----------+--------------+  PERO     Full                                                        +---------+---------------+---------+-----------+----------+--------------+   +---------+---------------+---------+-----------+----------+--------------+ LEFT     CompressibilityPhasicitySpontaneityPropertiesThrombus Aging  +---------+---------------+---------+-----------+----------+--------------+ CFV      Full           Yes      Yes                                 +---------+---------------+---------+-----------+----------+--------------+ SFJ      Full                                                        +---------+---------------+---------+-----------+----------+--------------+ FV Prox  Full                                                        +---------+---------------+---------+-----------+----------+--------------+ FV Mid   Full                                                        +---------+---------------+---------+-----------+----------+--------------+ FV DistalFull                                                        +---------+---------------+---------+-----------+----------+--------------+ PFV      Full                                                        +---------+---------------+---------+-----------+----------+--------------+ POP      Full           Yes      Yes                                 +---------+---------------+---------+-----------+----------+--------------+ PTV      Full                                                        +---------+---------------+---------+-----------+----------+--------------+ PERO     Full                                                        +---------+---------------+---------+-----------+----------+--------------+  Summary: RIGHT: - There is no evidence of deep vein thrombosis in the lower extremity.  - No cystic structure found in the popliteal fossa.  LEFT: - There is no evidence of deep vein thrombosis in the lower extremity.  - No cystic structure found in the popliteal fossa.  *See table(s) above for measurements and observations. Electronically signed by Ruta Hinds MD on 08/05/2020 at 2:55:12 PM.    Final     Lab Results:  CBC    Component Value Date/Time   WBC 10.0 08/07/2020 0111   RBC  4.06 (L) 08/07/2020 0111   HGB 9.8 (L) 08/07/2020 0111   HCT 34.2 (L) 08/07/2020 0111   PLT 274 08/07/2020 0111   MCV 84.2 08/07/2020 0111   MCH 24.1 (L) 08/07/2020 0111   MCHC 28.7 (L) 08/07/2020 0111   RDW 16.8 (H) 08/07/2020 0111   LYMPHSABS 0.7 08/07/2020 0111   MONOABS 0.9 08/07/2020 0111   EOSABS 0.0 08/07/2020 0111   BASOSABS 0.0 08/07/2020 0111    BMET    Component Value Date/Time   NA 140 08/07/2020 0111   NA 146 (H) 08/07/2019 1023   K 4.1 08/07/2020 0111   CL 93 (L) 08/07/2020 0111   CO2 37 (H) 08/07/2020 0111   GLUCOSE 233 (H) 08/07/2020 0111   BUN 73 (H) 08/07/2020 0111   BUN 27 08/07/2019 1023   CREATININE 2.04 (H) 08/07/2020 0111   CREATININE 1.34 06/10/2011 0904   CALCIUM 9.3 08/07/2020 0111   GFRNONAA 31 (L) 08/07/2020 0111   GFRAA 36 (L) 08/07/2020 0111    BNP    Component Value Date/Time   BNP 28.4 08/04/2020 1344    ProBNP    Component Value Date/Time   PROBNP 71 06/13/2019 1230   PROBNP 459.50 (H) 04/21/2016 1237    Specialty Problems      Pulmonary Problems   COPD (chronic obstructive pulmonary disease) with emphysema ([place])    S[facility]ro 2007:  FEV1 2.10 (46%), ratio 47 Stopped smoking 05/2011 Pulm rehab 2012, 2016 On exertional oxygen with portable concentrator. +response to daliresp.  PFT 2017 Ratio 47%, FEV1 1.76L (46% pred), FVC 3.78L, TLC 6.45L (82% pred), DLCO 13.18  05/10/18 - started chronic prof azithromycin      Pulmonary nodule    Ct chest 05/2011:  Nodule questioned on cxr not seen.       COPD exacerbation ([facility])   Chronic respiratory failure with hypoxia ([facility])    3L with exertion, 4-5 with heavy exertoin      Dyspnea    09/2016 HRCT > diffuse bronchial wall thickening and emphysema, no ILD, apical capping noted      COPD (chronic obstructive pulmonary disease) ([facility])      No Known Allergies  Immunization History  Administered Date(s) Administered  . Fluad Quad(high Dose 65+) 08/02/2019, 08/07/2020  . Influenza  Split 08/13/2011, 08/21/2012, 08/17/2014, 08/24/2015  . Influenza Whole 08/12/2009, 08/09/2010  . Influenza, High Dose Seasonal PF 08/09/2013, 08/13/2016, 09/05/2017, 07/08/2018  . PFIZER SARS-COV-2 Vaccination 01/18/2020, 02/15/2020  . Pneumococcal Conjugate-13 08/17/2014  . Pneumococcal Polysaccharide-23 08/10/2007    Past Medical History:  Diagnosis Date  . Allergic rhinitis   . Arthritis   . Cancer [facility])    left renal -solitary kidney  . COPD (chronic obstructive pulmonary disease) ([place])   . Erectile dysfunction   . Gout   . Hernia   . History of kidney cancer   . Hypercholesterolemia   . Hypertension   . RLS (restless legs syndrome)   .  Sinus problem     Tobacco History: Social History   Tobacco Use  Smoking Status Former Smoker  . Packs/day: 2.00  . Years: 50.00  . Pack years: 100.00  . Types: Cigarettes  . Quit date: 06/04/2011  . Years since quitting: 9.2  Smokeless Tobacco Never Used   Counseling given: Not Answered   Continue to not smoke  Outpatient Encounter Medications as of 08/14/2020  Medication Sig  . albuterol (PROAIR HFA) 108 (90 Base) MCG/ACT inhaler Inhale 2 puffs into the lungs every 4 (four) hours as needed for wheezing or shortness of breath.  . allopurinol (ZYLOPRIM) 300 MG tablet Take 300 mg by mouth daily.  Marland Kitchen ALPRAZolam (XANAX) 0.5 MG tablet Take 1 tablet (0.5 mg total) by mouth 2 (two) times daily. (Patient taking differently: Take 0.5 mg by mouth at bedtime. )  . amLODipine (NORVASC) 10 MG tablet Take 10 mg by mouth daily.   Marland Kitchen atorvastatin (LIPITOR) 20 MG tablet Take 20 mg by mouth daily.  Marland Kitchen azelastine (ASTELIN) 0.1 % nasal spray Place 1 spray into both nostrils 2 (two) times daily.   Marland Kitchen azithromycin (ZITHROMAX) 250 MG tablet Take Monday, Wednesday, Friday only with food. (Patient taking differently: Take 250 mg by mouth every Monday, Wednesday, and Friday. )  . Budeson-Glycopyrrol-Formoterol (BREZTRI AEROSPHERE) 160-9-4.8 MCG/ACT AERO  Inhale 2 puffs into the lungs in the morning and at bedtime. (Patient taking differently: Inhale 2 puffs into the lungs 2 (two) times daily. )  . Cholecalciferol (VITAMIN D-3) 25 MCG (1000 UT) CAPS Take 1,000 Units by mouth at bedtime.  Marland Kitchen DALIRESP 500 MCG TABS tablet TAKE 1 TABLET EVERY DAY (Patient taking differently: Take 500 mcg by mouth in the morning. )  . ELIQUIS 5 MG TABS tablet TAKE 1 TABLET TWO TIMES DAILY (Patient taking differently: Take 5 mg by mouth 2 (two) times daily. )  . fish oil-omega-3 fatty acids 1000 MG capsule Take 1 g by mouth 3 (three) times daily.   . flecainide (TAMBOCOR) 100 MG tablet TAKE 1  TABLET TWO TIMES DAILY (Patient taking differently: Take 100 mg by mouth 2 (two) times daily. )  . furosemide (LASIX) 40 MG tablet TAKE 1 TABLET TWICE DAILY IN THE MORNING AND AT 3 PM AS NEEDED FOR FLUID (Patient taking differently: Take 40 mg by mouth 2 (two) times daily. )  . ipratropium-albuterol (DUONEB) 0.5-2.5 (3) MG/3ML SOLN Use every 6-8 hours as needed for wheezing and SOB. (Patient taking differently: Take 3 mLs by nebulization every 6 (six) hours as needed (for shortness of breath or wheezing). )  . Linaclotide (LINZESS) 145 MCG CAPS capsule Take 145 mcg by mouth daily before breakfast.   . Magnesium 500 MG TABS Take 500 mg by mouth at bedtime.  . metoprolol succinate (TOPROL-XL) 50 MG 24 hr tablet Take 50 mg by mouth daily. Take with or immediately following a meal.  . montelukast (SINGULAIR) 10 MG tablet TAKE 1 TABLET AT BEDTIME (Patient taking differently: Take 10 mg by mouth at bedtime. )  . Multiple Vitamin (MULITIVITAMIN WITH MINERALS) TABS Take 1 tablet by mouth daily. (Patient not taking: Reported on 08/04/2020)  . Multiple Vitamins-Minerals (ONE-A-DAY MENS 50+) TABS Take 1 tablet by mouth daily with breakfast.  . nitroGLYCERIN (NITROSTAT) 0.4 MG SL tablet Place 0.4 mg under the tongue every 5 (five) minutes as needed for chest pain.  Marland Kitchen nystatin (MYCOSTATIN) 100000  UNIT/ML suspension Take 5 mLs (500,000 Units total) by mouth 4 (four) times daily. (Patient taking differently:  Take 5 mLs by mouth 4 (four) times daily as needed (AS DIRECTED). )  . OXYGEN Inhale 6-8 L/min into the lungs continuous. Inhale 6-7 L/min of oxygen continuously and increase to 8 L/min, if exerted  . potassium chloride (KLOR-CON) 10 MEQ tablet TAKE 1 TABLET TWO TIMES DAILY AS NEEDED WITH FUROSEMIDE (Patient taking differently: Take 10 mEq by mouth 2 (two) times daily. )  . predniSONE (DELTASONE) 10 MG tablet Take 4 tablets (40 mg) daily for 2 days, then, Take 3 tablets (30 mg) daily for 2 days, then, Take 2 tablets (20 mg) daily for 2 days, then, Take 1 tablets (10 mg) daily for 1 days, then stop.  Marland Kitchen Respiratory Therapy Supplies (FLUTTER) DEVI Use as directed  . rOPINIRole (REQUIP) 0.5 MG tablet Take 1 tablet (0.5 mg total) by mouth daily. (Patient taking differently: Take 0.5 mg by mouth at bedtime. )  . sodium chloride HYPERTONIC 3 % nebulizer solution Take by nebulization as needed for other. (Patient taking differently: Take 4 mLs by nebulization daily as needed (to loosen phlegm). )  . sodium chloride HYPERTONIC 3 % nebulizer solution  (Patient not taking: Reported on 08/04/2020)  . temazepam (RESTORIL) 15 MG capsule Take 15 mg by mouth at bedtime as needed for sleep.   . traMADol (ULTRAM) 50 MG tablet TAKE 1 TABLET BY MOUTH THREE TIMES DAILY AS NEEDED FOR MODERATE PAIN (Patient taking differently: Take 50 mg by mouth See admin instructions. Take 50 mg by mouth at bedtime and an additional 50 mg up to two times a day as needed for anxiety)  . vitamin C (ASCORBIC ACID) 500 MG tablet Take 500 mg by mouth daily.  . [DISCONTINUED] albuterol (PROAIR HFA) 108 (90 Base) MCG/ACT inhaler INHALE 2 PUFFS BY MOUTH INTO THE LUNGS EVERY 4 HOURS AS NEEDED FOR WHEEZING OR SHORTNESS OF BREATH (Patient taking differently: Inhale 2 puffs into the lungs every 4 (four) hours as needed for wheezing or shortness  of breath. )  . [DISCONTINUED] budesonide-formoterol (SYMBICORT) 160-4.5 MCG/ACT inhaler Inhale 2 puffs into the lungs 2 (two) times daily. (Patient not taking: Reported on 08/04/2020)  . [DISCONTINUED] Tiotropium Bromide Monohydrate (SPIRIVA RESPIMAT) 2.5 MCG/ACT AERS Inhale 2 puffs into the lungs daily. (Patient not taking: Reported on 08/04/2020)   No facility-administered encounter medications on file as of 08/14/2020.     Review of Systems  Review of Systems  Constitutional: Positive for fatigue. Negative for activity change, chills, fever and unexpected weight change.  HENT: Positive for congestion. Negative for postnasal drip, rhinorrhea, sinus pressure, sinus pain and sore throat.   Eyes: Negative.   Respiratory: Positive for shortness of breath. Negative for cough and wheezing.   Cardiovascular: Negative for chest pain and palpitations.  Gastrointestinal: Negative for constipation, diarrhea, nausea and vomiting.  Endocrine: Negative.   Genitourinary: Negative.   Musculoskeletal: Negative.   Skin: Negative.   Neurological: Negative for dizziness and headaches.  Psychiatric/Behavioral: Negative.  Negative for dysphoric mood. The patient is not nervous/anxious.   All other systems reviewed and are negative.    Physical Exam  BP 138/60 (BP Location: Left Arm, Cuff Size: Normal)   Pulse 66   Temp 97.6 F (36.4 C) (Temporal)   Ht 6\' 2"  (1.88 m)   Wt (!) 317 lb (143.8 kg)   SpO2 97% Comment: 8lpm cont o2  BMI 40.70 kg/m   Wt Readings from Last 5 Encounters:  08/14/20 (!) 317 lb (143.8 kg)  08/05/20 205 lb (93 kg)  07/03/20 Marland Kitchen)  304 lb 11.2 oz (138.2 kg)  06/06/20 (!) 304 lb 6.4 oz (138.1 kg)  05/30/20 (!) 307 lb (139.3 kg)   Reviewed.  Weight from 08/05/2020 hospital discharge was recorded incorrectly.  BMI Readings from Last 5 Encounters:  08/14/20 40.70 kg/m  08/05/20 26.32 kg/m  07/03/20 39.12 kg/m  06/06/20 39.08 kg/m  05/30/20 39.42 kg/m     Physical  Exam Vitals and nursing note reviewed.  Constitutional:      General: He is not in acute distress.    Appearance: Normal appearance. He is obese.  HENT:     Head: Normocephalic and atraumatic.     Right Ear: Hearing and external ear normal.     Left Ear: Hearing and external ear normal.     Nose: Congestion and rhinorrhea present. No mucosal edema.     Right Turbinates: Not enlarged.     Left Turbinates: Not enlarged.     Mouth/Throat:     Mouth: Mucous membranes are dry.     Pharynx: Oropharynx is clear. No oropharyngeal exudate.     Comments: pnd Eyes:     Pupils: Pupils are equal, round, and reactive to light.  Cardiovascular:     Rate and Rhythm: Normal rate and regular rhythm.     Pulses: Normal pulses.     Heart sounds: Normal heart sounds. No murmur heard.   Pulmonary:     Effort: Pulmonary effort is normal.     Breath sounds: No decreased breath sounds, wheezing or rales.  Musculoskeletal:     Cervical back: Normal range of motion.     Right lower leg: Edema present.     Left lower leg: Edema present.  Lymphadenopathy:     Cervical: No cervical adenopathy.  Skin:    General: Skin is warm and dry.     Capillary Refill: Capillary refill takes less than 2 seconds.     Findings: No erythema or rash.  Neurological:     General: No focal deficit present.     Mental Status: He is alert and oriented to person, place, and time.     Motor: No weakness.     Coordination: Coordination normal.     Gait: Gait is intact. Gait normal.  Psychiatric:        Mood and Affect: Mood normal.        Behavior: Behavior normal. Behavior is cooperative.        Thought Content: Thought content normal.        Judgment: Judgment normal.       Assessment & Plan:   Diastolic CHF (HCC) Plan: Continue current medications Keep follow-up with Dr. Einar Gip   Atrial fibrillation [facility]) Plan: Keep follow-up with Dr. Einar Gip later on this month Continue current medications  COPD (chronic  obstructive pulmonary disease) with emphysema ([place]) Recent COPD exacerbation/chronic hypoxemic respiratory failure Discussed potential option of starting daily prednisone, patient declined We will work on aggressively managing congestion  Plan: Continue Breztri' Continue Daliresp 500 mcg Continue Monday Wednesday Friday azithromycin Continue noninvasive ventilator given hypoxemia and hypercapnia Continue hypertonic saline nebs twice daily Continue DuoNeb nebulized meds every 6-8 hours as needed for shortness of breath or wheezing Continue Astelin nasal spray Continue Flonase nasal spray Start nasal saline rinses 4-week follow-up with our office   Chronic respiratory failure with hypoxia ([place]) Plan: Continue oxygen therapy Continue noninvasive ventilator to the best your ability, goal is 4 hours of use a day Contacted [employer] for compliance report on NIV  AKI (acute kidney injury) [facility]) Plan: Keep follow-up with primary care and cardiology    Return in about 26 days (around 09/09/2020), or if symptoms worsen or fail to improve, for Follow up with Dr. Valeta Harms.   Lauraine Rinne, NP 08/14/2020   This appointment required 52 minutes of patient care (this includes precharting, chart review, review of results, face-to-face care, etc.).

## 2020-08-14 NOTE — Patient Instructions (Addendum)
You were seen today by Lauraine Rinne, NP  for:   1. Centrilobular emphysema (HCC)  Breztri >>> 2 puffs in the morning right when you wake up, rinse out your mouth after use, 12 hours later 2 puffs, rinse after use >>> Take this daily, no matter what >>> This is not a rescue inhaler   Continue Daliresp  Continue Monday Wednesday Friday azithromycin  Note your daily symptoms > remember "red flags" for COPD:   >>>Increase in cough >>>increase in sputum production >>>increase in shortness of breath or activity  intolerance.   If you notice these symptoms, please call the office to be seen.   Continue Astelin nasal spray Continue Flonase nasal spray  Start nasal saline rinses twice daily Use distilled water Shake well Get bottle lukewarm like a baby bottle >>> Use before nasal medications   2. Chronic respiratory failure with hypoxia (HCC)  Continue oxygen therapy as prescribed  >>>maintain oxygen saturations greater than 88 percent  >>>if unable to maintain oxygen saturations please contact the office  >>>do not smoke with oxygen  >>>can use nasal saline gel or nasal saline rinses to moisturize nose if oxygen causes dryness  Continue noninvasive ventilator to the best of your ability, as discussed our goal would be at least 4 hours worth of use  We will obtain a download  Would recommend bringing the equipment with you to [facility]   Follow Up:    Return in about 26 days (around 09/09/2020), or if symptoms worsen or fail to improve, for Follow up with Dr. Valeta Harms.   Please do your part to reduce the spread of COVID-19:      Reduce your risk of any infection  and COVID19 by using the similar precautions used for avoiding the common cold or flu:  Marland Kitchen Wash your hands often with soap and warm water for at least 20 seconds.  If soap and water are not readily available, use an alcohol-based hand sanitizer with at least 60% alcohol.  . If coughing or sneezing, cover your  mouth and nose by coughing or sneezing into the elbow areas of your shirt or coat, into a tissue or into your sleeve (not your hands). Langley Gauss A MASK when in public  . Avoid shaking hands with others and consider head nods or verbal greetings only. . Avoid touching your eyes, nose, or mouth with unwashed hands.  . Avoid close contact with people who are sick. . Avoid places or events with large numbers of people in one location, like concerts or sporting events. . If you have some symptoms but not all symptoms, continue to monitor at home and seek medical attention if your symptoms worsen. . If you are having a medical emergency, call 911.   [place] / e-Visit: [email or web address]         [facility]: [facility]: [phone]                   [facility]: [phone]     It is flu season:   >>> Best ways to protect herself from the flu: Receive the yearly flu vaccine, practice good hand hygiene washing with soap and also using hand sanitizer when available, eat a nutritious meals, get adequate rest, hydrate appropriately   Please contact the office if your symptoms worsen or you have concerns that you are not improving.   Thank you for  choosing [facility] for your healthcare, and for allowing [place] to partner with you on your healthcare journey. I am thankful to be able to provide care to you today.   [name]-C

## 2020-08-15 ENCOUNTER — Ambulatory Visit: Payer: Medicare Other | Admitting: Student

## 2020-08-19 ENCOUNTER — Telehealth: Payer: Self-pay

## 2020-08-19 NOTE — Telephone Encounter (Signed)
(  2:58p) SW attempted to schedule the initial palliative care visit with patient. SW spoke to his wife-Melissa who declined services, stating " he is just not ready for that right now". SW explained palliative care services to her and the visit frequency and stressing that palliative care is a supportive care model. She declined the services, stating that he was doing better.

## 2020-08-26 ENCOUNTER — Telehealth: Payer: Self-pay

## 2020-08-27 ENCOUNTER — Telehealth: Payer: Self-pay | Admitting: Pulmonary Disease

## 2020-08-27 DIAGNOSIS — J432 Centrilobular emphysema: Secondary | ICD-10-CM

## 2020-08-27 MED ORDER — PREDNISONE 10 MG PO TABS: ORAL_TABLET | ORAL | 0 refills | Status: DC

## 2020-08-27 NOTE — Telephone Encounter (Signed)
Spoke with pt's wife, [name]. States that for the last few days the pt's oxygen levels have been dropping while on oxygen. The lowest his level has gotten in 88% on 8-10L continuous. Melissa would like to know if they need to do anything else before his upcoming appointment on 09/09/20 with Dr. Valeta Harms. If they increase the pt's oxygen to 12L they can keep his oxygen level at 95% but his home concentrator only goes up to 10L. Melissa would like to know if we can order a new home concentrator that can accommodate up to 15L.  Dr. Valeta Harms - please advise. Thanks.  **paged Icard at [phone].

## 2020-08-27 NOTE — Telephone Encounter (Signed)
Spoke with pt's wife, [name]. She is aware of Dr. Juline Patch response. States that Dr. Valeta Harms told them that 2 concentrators could be put together to give him the amount that he needs. Pt is not having any sick symptoms at this time. Melissa states that they have plenty of tanks to get them to his visit and he could use our oxygen while he is here. Rx for prednisone has been sent in.  Dr. Valeta Harms - please advise. Thanks. [name] is okay with a call back tomorrow about the concentrators.

## 2020-08-27 NOTE — Telephone Encounter (Signed)
done

## 2020-08-27 NOTE — Telephone Encounter (Signed)
PCCM:  Unfortunately I do not believe there is another option for getting any higher levels of oxygen at home.  I am surprised he is able to get this much delivered from a home concentrator.  If his O2 sats continued to fall we need to look to find additional reasons.    Is his oxygen just falling or is he having sick symptoms?    If his O2 sats continue to follow and they are unable to keep them above 88% at home.  Unfortunately would recommend him seeking evaluation in the emergency department.  How is he going to make it to the office?  I am not sure he has portable oxygen that can deliver his O2 needs?  Ok to start him on Prednisone taper 40mg  X4 day, and decrease by 10mg  every 4, then stop.   [facility] 08/27/2020 4:46 PM

## 2020-08-27 NOTE — Telephone Encounter (Signed)
Ok to contact his DME supplier for these concentrators. Ok to place O2 order. Garner Nash, DO [facility] 08/27/2020 5:23 PM

## 2020-08-28 NOTE — Telephone Encounter (Signed)
Order has been placed to [employer] for 15L concentrator.  Patient's spouse, Melissa(DPR) is aware and voiced her understanding.   Nothing further needed.

## 2020-09-02 ENCOUNTER — Ambulatory Visit: Payer: Medicare Other | Admitting: Cardiology

## 2020-09-03 ENCOUNTER — Telehealth: Payer: Self-pay | Admitting: Pulmonary Disease

## 2020-09-03 NOTE — Telephone Encounter (Signed)
Unfortunately the anxious and nervous feeling is normal. Take the medication first thing in the morning and not at night to help with sleep and avoid insomnia from it. It will get batter as he tapers down  Garner Nash, DO [facility] 09/03/2020 5:47 PM

## 2020-09-03 NOTE — Telephone Encounter (Signed)
Spoke with pt's wife [name] who states pt is suffering from muscle twitching, nervous feelings am inability to sleep since starting prednisone taper pack last week. Pt's wife is stating they have used topical pain relief lotion and increased intake of potassium which has not seemed to help. Pt's wife states this happens every time pt takes prednisone.  Pt's wife is seeking advice form Dr. Valeta Harms about any medication that can help with these symptoms.  Dr. Valeta Harms please advise.

## 2020-09-04 NOTE — Telephone Encounter (Signed)
Spoke with [name] (pt's wife) and reviewed Dr. Juline Patch reply. Melissa stated understanding. Melissa did mention pt had a better night sleep last night. Nothing further needed at this time.

## 2020-09-09 ENCOUNTER — Telehealth: Payer: Self-pay

## 2020-09-09 ENCOUNTER — Ambulatory Visit: Payer: Medicare Other | Admitting: Cardiology

## 2020-09-09 ENCOUNTER — Other Ambulatory Visit: Payer: Self-pay

## 2020-09-09 ENCOUNTER — Ambulatory Visit (INDEPENDENT_AMBULATORY_CARE_PROVIDER_SITE_OTHER): Payer: Medicare Other | Admitting: Pulmonary Disease

## 2020-09-09 ENCOUNTER — Encounter: Payer: Self-pay | Admitting: Pulmonary Disease

## 2020-09-09 VITALS — BP 140/70 | HR 81 | Temp 97.4°F | Ht 74.0 in | Wt 308.2 lb

## 2020-09-09 DIAGNOSIS — J432 Centrilobular emphysema: Secondary | ICD-10-CM | POA: Diagnosis not present

## 2020-09-09 DIAGNOSIS — Z66 Do not resuscitate: Secondary | ICD-10-CM | POA: Diagnosis not present

## 2020-09-09 DIAGNOSIS — J9611 Chronic respiratory failure with hypoxia: Secondary | ICD-10-CM

## 2020-09-09 DIAGNOSIS — I5032 Chronic diastolic (congestive) heart failure: Secondary | ICD-10-CM

## 2020-09-09 MED ORDER — ALBUTEROL SULFATE HFA 108 (90 BASE) MCG/ACT IN AERS: 2.0000 | INHALATION_SPRAY | RESPIRATORY_TRACT | 3 refills | Status: AC | PRN

## 2020-09-09 NOTE — Telephone Encounter (Signed)
Telephone call to patient to schedule palliative care visit with patient. Patient/family in agreement with home visit on 09/18/20 @ 1pm.

## 2020-09-09 NOTE — Progress Notes (Signed)
Synopsis: Referred in February 2021 for establish care with new pulmonary provider, PCP: by Alroy Dust, L.[name], MD  Subjective:   PATIENT ID: Harold Hall GENDER: male DOB: 1946-05-23, MRN: [id number]  Chief Complaint  Patient presents with  . Follow-up    Patient feels breathing is about the same since last visit, productive cough with clear sputum, wears between 8-12 liters oxygen.     74 year old gentleman past medical history of renal cancer, hypertension, hyperlipidemia.  Last seen in our office by Wyn Quaker, NP.  Patient followed in the office for COPD.  Former patient of Dr. Lake Bells.  Currently maintained on Symbicort, Spiriva, Daliresp, azithromycin Monday Wednesday Friday.  Patient has a 75-year pack of smoking.  Patient's last set of full PFTs was in 2017 which revealed an FEV1 of 1.76 L, 46% predicted current respiratory status and chronic hypoxemic respiratory failure complicated by COPD and chronic diastolic heart failure.  Currently maintained on home oxygen therapy.  OV 01/03/2020: Today, patient here today with follow-up for advanced COPD.  He still has some dyspnea on exertion.  Has not been able to go to pulmonary rehab due to Covid.  Did not feel safe doing this.  On continuous oxygen therapy 6 L.  At this time does not feel like his inhalers are working as well as they used to.  Has some trouble getting the medication from his Symbicort.  He had talked about potentially switching to a single inhaler regimen to include triple therapy with Wyn Quaker, NP at his last office visit.  Patient denies hemoptysis.  He does have sputum production daily.  Recently complained of fullness in the right side to his primary care provider.  Has a history of kidney cancer.  They are planning to have a CT image of his abdomen.  OV 09/09/2020: Patient had a recent hospitalization and September 2021 for a COPD exacerbation. Patient was discharged from the hospital on 08/07/2020. Had difficulty  wearing BiPAP. Was treated with IV Solu-Medrol, azithromycin. Discharge summary reviewed written by Dr. Tarri Abernethy. Patient had subsequent hospital follow-up on 08/14/2020 with Wyn Quaker, NP currently on triple therapy inhaler regimen plus Daliresp plus azithromycin Monday Wednesday Friday. Last office visit discussed potential of starting daily prednisone for palliation but patient declined. Office note documentation from last visit reviewed.  Patient here today to discuss current treatments.  On a prednisone taper after a telephone call from last week for worsening dyspnea and shortness of breath.  Recent new DME supplies for a double concentrator at home due to increasing oxygen requirements.  Today in the office we had a heartfelt discussion about his current goals of care.  He is on maximal medical therapy for his COPD management.  He is now on at very high oxygen requirements.  We discussed the utility of palliation and palliative care involvement in the outpatient setting as well as potential referrals to hospice.  We also had discussions regarding [facility] form as well as durable DNR form.  He is agreeable to proceed with signing these today in the office.  Patient's wife was present for this discussion.    Past Medical History:  Diagnosis Date  . Allergic rhinitis   . Arthritis   . Cancer [facility])    left renal -solitary kidney  . COPD (chronic obstructive pulmonary disease) ([place])   . Erectile dysfunction   . Gout   . Hernia   . History of kidney cancer   . Hypercholesterolemia   . Hypertension   .  RLS (restless legs syndrome)   . Sinus problem      Family History  Problem Relation Age of Onset  . COPD Mother   . Hypertension Mother   . Osteoporosis Mother   . Prostate cancer Father   . Cancer Father        prostate     Past Surgical History:  Procedure Laterality Date  . CARDIAC CATHETERIZATION    . HERNIA REPAIR  06/04/11  . left kidney removed  1994    Social  History   Socioeconomic History  . Marital status: Married    Spouse name: Not on file  . Number of children: 1  . Years of education: Not on file  . Highest education level: Not on file  Occupational History  . Not on file  Tobacco Use  . Smoking status: Former Smoker    Packs/day: 2.00    Years: 50.00    Pack years: 100.00    Types: Cigarettes    Quit date: 06/04/2011    Years since quitting: 9.2  . Smokeless tobacco: Never Used  Vaping Use  . Vaping Use: Never used  Substance and Sexual Activity  . Alcohol use: Yes    Alcohol/week: 2.0 - 3.0 standard drinks    Types: 2 - 3 Cans of beer per week    Comment: beers  . Drug use: No  . Sexual activity: Not on file  Other Topics Concern  . Not on file  Social History Narrative  . Not on file   Social Determinants of Health   Financial Resource Strain:   . Difficulty of Paying Living Expenses: Not on file  Food Insecurity:   . Worried About Charity fundraiser in the Last Year: Not on file  . Ran Out of Food in the Last Year: Not on file  Transportation Needs:   . Lack of Transportation (Medical): Not on file  . Lack of Transportation (Non-Medical): Not on file  Physical Activity:   . Days of Exercise per Week: Not on file  . Minutes of Exercise per Session: Not on file  Stress:   . Feeling of Stress : Not on file  Social Connections:   . Frequency of Communication with Friends and Family: Not on file  . Frequency of Social Gatherings with Friends and Family: Not on file  . Attends Religious Services: Not on file  . Active Member of Clubs or Organizations: Not on file  . Attends Archivist Meetings: Not on file  . Marital Status: Not on file  Intimate Partner Violence:   . Fear of Current or Ex-Partner: Not on file  . Emotionally Abused: Not on file  . Physically Abused: Not on file  . Sexually Abused: Not on file     No Known Allergies   Outpatient Medications Prior to Visit  Medication Sig  Dispense Refill  . allopurinol (ZYLOPRIM) 300 MG tablet Take 300 mg by mouth daily.    Marland Kitchen ALPRAZolam (XANAX) 0.5 MG tablet Take 1 tablet (0.5 mg total) by mouth 2 (two) times daily. (Patient taking differently: Take 0.5 mg by mouth at bedtime. ) 60 tablet 0  . amLODipine (NORVASC) 10 MG tablet Take 10 mg by mouth daily.     Marland Kitchen atorvastatin (LIPITOR) 20 MG tablet Take 20 mg by mouth daily.    Marland Kitchen azelastine (ASTELIN) 0.1 % nasal spray Place 1 spray into both nostrils 2 (two) times daily.   2  . azithromycin (ZITHROMAX) 250  MG tablet Take Monday, Wednesday, Friday only with food. (Patient taking differently: Take 250 mg by mouth every Monday, Wednesday, and Friday. ) 39 tablet 2  . Budeson-Glycopyrrol-Formoterol (BREZTRI AEROSPHERE) 160-9-4.8 MCG/ACT AERO Inhale 2 puffs into the lungs in the morning and at bedtime. (Patient taking differently: Inhale 2 puffs into the lungs 2 (two) times daily. ) 32.1 g 2  . Cholecalciferol (VITAMIN D-3) 25 MCG (1000 UT) CAPS Take 1,000 Units by mouth at bedtime.    Marland Kitchen DALIRESP 500 MCG TABS tablet TAKE 1 TABLET EVERY DAY (Patient taking differently: Take 500 mcg by mouth in the morning. ) 90 tablet 3  . ELIQUIS 5 MG TABS tablet TAKE 1 TABLET TWO TIMES DAILY (Patient taking differently: Take 5 mg by mouth 2 (two) times daily. ) 180 tablet 3  . fish oil-omega-3 fatty acids 1000 MG capsule Take 1 g by mouth 3 (three) times daily.     . flecainide (TAMBOCOR) 100 MG tablet TAKE 1  TABLET TWO TIMES DAILY (Patient taking differently: Take 100 mg by mouth 2 (two) times daily. ) 180 tablet 1  . furosemide (LASIX) 40 MG tablet TAKE 1 TABLET TWICE DAILY IN THE MORNING AND AT 3 PM AS NEEDED FOR FLUID (Patient taking differently: Take 40 mg by mouth 2 (two) times daily. ) 180 tablet 1  . ipratropium-albuterol (DUONEB) 0.5-2.5 (3) MG/3ML SOLN Use every 6-8 hours as needed for wheezing and SOB. (Patient taking differently: Take 3 mLs by nebulization every 6 (six) hours as needed (for  shortness of breath or wheezing). ) 360 mL 12  . Linaclotide (LINZESS) 145 MCG CAPS capsule Take 145 mcg by mouth daily before breakfast.     . Magnesium 500 MG TABS Take 500 mg by mouth at bedtime.    . metoprolol succinate (TOPROL-XL) 50 MG 24 hr tablet Take 50 mg by mouth daily. Take with or immediately following a meal.    . montelukast (SINGULAIR) 10 MG tablet TAKE 1 TABLET AT BEDTIME (Patient taking differently: Take 10 mg by mouth at bedtime. ) 90 tablet 2  . Multiple Vitamin (MULITIVITAMIN WITH MINERALS) TABS Take 1 tablet by mouth daily.     . Multiple Vitamins-Minerals (ONE-A-DAY MENS 50+) TABS Take 1 tablet by mouth daily with breakfast.    . nitroGLYCERIN (NITROSTAT) 0.4 MG SL tablet Place 0.4 mg under the tongue every 5 (five) minutes as needed for chest pain.    Marland Kitchen nystatin (MYCOSTATIN) 100000 UNIT/ML suspension Take 5 mLs (500,000 Units total) by mouth 4 (four) times daily. (Patient taking differently: Take 5 mLs by mouth 4 (four) times daily as needed (AS DIRECTED). ) 60 mL 0  . OXYGEN Inhale 6-8 L/min into the lungs continuous. Inhale 6-7 L/min of oxygen continuously and increase to 8 L/min, if exerted    . potassium chloride (KLOR-CON) 10 MEQ tablet TAKE 1 TABLET TWO TIMES DAILY AS NEEDED WITH FUROSEMIDE (Patient taking differently: Take 10 mEq by mouth 2 (two) times daily. ) 180 tablet 3  . predniSONE (DELTASONE) 10 MG tablet Take 4 tablets for 4 days, 3 tablets for 4 days, 2 tablets for 4 days, 1 tablet for 4 days then stop 40 tablet 0  . Respiratory Therapy Supplies (FLUTTER) DEVI Use as directed 1 each 0  . rOPINIRole (REQUIP) 0.5 MG tablet Take 1 tablet (0.5 mg total) by mouth daily. (Patient taking differently: Take 0.5 mg by mouth at bedtime. ) 30 tablet 0  . sodium chloride HYPERTONIC 3 % nebulizer solution Take  by nebulization as needed for other. (Patient taking differently: Take 4 mLs by nebulization daily as needed (to loosen phlegm). ) 750 mL 12  . sodium chloride  HYPERTONIC 3 % nebulizer solution     . temazepam (RESTORIL) 15 MG capsule Take 15 mg by mouth at bedtime as needed for sleep.     . traMADol (ULTRAM) 50 MG tablet TAKE 1 TABLET BY MOUTH THREE TIMES DAILY AS NEEDED FOR MODERATE PAIN (Patient taking differently: Take 50 mg by mouth See admin instructions. Take 50 mg by mouth at bedtime and an additional 50 mg up to two times a day as needed for anxiety) 90 tablet 0  . vitamin C (ASCORBIC ACID) 500 MG tablet Take 500 mg by mouth daily.    Marland Kitchen albuterol (PROAIR HFA) 108 (90 Base) MCG/ACT inhaler Inhale 2 puffs into the lungs every 4 (four) hours as needed for wheezing or shortness of breath. 8 g 2  . losartan-hydrochlorothiazide (HYZAAR) 100-25 MG tablet Take 1 tablet by mouth daily.     No facility-administered medications prior to visit.    Review of Systems  Constitutional: Negative for chills, fever, malaise/fatigue and weight loss.  HENT: Negative for hearing loss, sore throat and tinnitus.   Eyes: Negative for blurred vision and double vision.  Respiratory: Positive for cough and shortness of breath. Negative for hemoptysis, sputum production, wheezing and stridor.   Cardiovascular: Positive for leg swelling. Negative for chest pain, palpitations, orthopnea and PND.  Gastrointestinal: Negative for abdominal pain, constipation, diarrhea, heartburn, nausea and vomiting.  Genitourinary: Negative for dysuria, hematuria and urgency.  Musculoskeletal: Negative for joint pain and myalgias.  Skin: Negative for itching and rash.  Neurological: Negative for dizziness, tingling, weakness and headaches.  Endo/Heme/Allergies: Negative for environmental allergies. Does not bruise/bleed easily.  Psychiatric/Behavioral: Negative for depression. The patient is not nervous/anxious and does not have insomnia.   All other systems reviewed and are negative.    Objective:  Physical Exam Vitals reviewed.  Constitutional:      General: He is not in acute  distress.    Appearance: He is well-developed. He is obese.  HENT:     Head: Normocephalic and atraumatic.  Eyes:     General: No scleral icterus.    Conjunctiva/sclera: Conjunctivae normal.     Pupils: Pupils are equal, round, and reactive to light.  Neck:     Vascular: No JVD.     Trachea: No tracheal deviation.  Cardiovascular:     Rate and Rhythm: Normal rate and regular rhythm.     Heart sounds: Normal heart sounds. No murmur heard.   Pulmonary:     Effort: Pulmonary effort is normal. No tachypnea, accessory muscle usage or respiratory distress.     Breath sounds: No stridor. No wheezing, rhonchi or rales.     Comments: Near absent bilateral upper lobe breath sounds comparison to the bases. Abdominal:     General: Bowel sounds are normal. There is no distension.     Palpations: Abdomen is soft.     Tenderness: There is no abdominal tenderness.  Musculoskeletal:        General: No tenderness.     Cervical back: Neck supple.  Lymphadenopathy:     Cervical: No cervical adenopathy.  Skin:    General: Skin is warm and dry.     Capillary Refill: Capillary refill takes less than 2 seconds.     Findings: No rash.  Neurological:     Mental Status: He is alert  and oriented to person, place, and time.  Psychiatric:        Behavior: Behavior normal.      Vitals:   09/09/20 0907  BP: 140/70  Pulse: 81  Temp: (!) 97.4 F (36.3 C)  TempSrc: Temporal  SpO2: 91%  Weight: (!) 308 lb 3.2 oz (139.8 kg)  Height: 6\' 2"  (1.88 m)   91% on 6 LPM  BMI Readings from Last 3 Encounters:  09/09/20 39.57 kg/m  08/14/20 40.70 kg/m  08/05/20 26.32 kg/m   Wt Readings from Last 3 Encounters:  09/09/20 (!) 308 lb 3.2 oz (139.8 kg)  08/14/20 (!) 317 lb (143.8 kg)  08/05/20 205 lb (93 kg)     CBC    Component Value Date/Time   WBC 10.0 08/07/2020 0111   RBC 4.06 (L) 08/07/2020 0111   HGB 9.8 (L) 08/07/2020 0111   HCT 34.2 (L) 08/07/2020 0111   PLT 274 08/07/2020 0111   MCV  84.2 08/07/2020 0111   MCH 24.1 (L) 08/07/2020 0111   MCHC 28.7 (L) 08/07/2020 0111   RDW 16.8 (H) 08/07/2020 0111   LYMPHSABS 0.7 08/07/2020 0111   MONOABS 0.9 08/07/2020 0111   EOSABS 0.0 08/07/2020 0111   BASOSABS 0.0 08/07/2020 0111    Pulmonary Functions Testing Results: PFT Results Latest Ref Rng & Units 04/22/2016  FVC-Pre L 3.58  FVC-Predicted Pre % 69  FVC-Post L 3.78  FVC-Predicted Post % 73  Pre FEV1/FVC % % 49  Post FEV1/FCV % % 47  FEV1-Pre L 1.77  FEV1-Predicted Pre % 46  FEV1-Post L 1.76  DLCO uncorrected ml/min/mmHg 13.18  DLCO UNC% % 34  DLCO corrected ml/min/mmHg 12.67  DLCO COR %Predicted % 33  DLVA Predicted % 48  TLC L 6.45  TLC % Predicted % 82  RV % Predicted % 93    Chest x-ray 08/04/2020: Advanced emphysema, bilateral interstitial infiltrates. The patient's images have been independently reviewed by me.     Assessment & Plan:     ICD-10-CM   1. Centrilobular emphysema ([place])  J43.2 Amb Referral to Palliative Care  2. Chronic respiratory failure with hypoxia (HCC)  J96.11 Amb Referral to Palliative Care  3. DNR (do not resuscitate)  Z66   4. Chronic diastolic congestive heart failure (HCC)  I50.32     Assessment:   This is a 74 year old gentleman, severe chronic hypoxemic respiratory failure, related to multiple etiologies, chronic diastolic heart failure, severe COPD, severe upper lobe centrilobular emphysema.  Currently on triple therapy inhaler plus Daliresp plus azithromycin Monday Wednesday Friday.  Has had recent exacerbation requiring hospitalization as well as an outpatient exacerbation currently on prednisone tapering.  At this point I do believe the patient has end-stage severe hypoxemic respiratory failure related to the etiologies discussed above.  I do believe next appropriate steps would be consideration for more palliative approach.  Patient is not interested in going back in and out of the hospital.  He also does not like wearing  NIPPV.  He has a machine currently at home but does not like using it.  I am unsure will be able to obtain any additional higher oxygen requirements if needed at home at this time.  Today we had a goals of care discussion in the office.   PCCM Goals of Care Discussion and Advanced Care Planning:   Date: 09/09/2020   Present Parties: Patient, patient's wife  What was discussed: Outpatient goals of care, outpatient palliative care referral, completion of durable DNR form as  well as [employer] form.  Copies to be scanned into the electronic medical record  Outcome: Durable DNR, I suspect if the patient continues to worsen at home would consider transition to hospice care.  16 mins of time was spent discussing the goals of care, advanced care planning options such as code status as well as do not resuscitate forms. [phone])  In addition, I spent 42 minutes dedicated to the care of this patient on the date of this encounter to include pre-visit review of records, face-to-face time with the patient discussing conditions above, post visit ordering of testing, clinical documentation with the electronic health record, making appropriate referrals as documented, and communicating necessary findings to members of the patients care team.    Current Outpatient Medications:  .  albuterol (PROAIR HFA) 108 (90 Base) MCG/ACT inhaler, Inhale 2 puffs into the lungs every 4 (four) hours as needed for wheezing or shortness of breath., Disp: 24 g, Rfl: 3 .  allopurinol (ZYLOPRIM) 300 MG tablet, Take 300 mg by mouth daily., Disp: , Rfl:  .  ALPRAZolam (XANAX) 0.5 MG tablet, Take 1 tablet (0.5 mg total) by mouth 2 (two) times daily. (Patient taking differently: Take 0.5 mg by mouth at bedtime. ), Disp: 60 tablet, Rfl: 0 .  amLODipine (NORVASC) 10 MG tablet, Take 10 mg by mouth daily. , Disp: , Rfl:  .  atorvastatin (LIPITOR) 20 MG tablet, Take 20 mg by mouth daily., Disp: , Rfl:  .  azelastine (ASTELIN)  0.1 % nasal spray, Place 1 spray into both nostrils 2 (two) times daily. , Disp: , Rfl: 2 .  azithromycin (ZITHROMAX) 250 MG tablet, Take Monday, Wednesday, Friday only with food. (Patient taking differently: Take 250 mg by mouth every Monday, Wednesday, and Friday. ), Disp: 39 tablet, Rfl: 2 .  Budeson-Glycopyrrol-Formoterol (BREZTRI AEROSPHERE) 160-9-4.8 MCG/ACT AERO, Inhale 2 puffs into the lungs in the morning and at bedtime. (Patient taking differently: Inhale 2 puffs into the lungs 2 (two) times daily. ), Disp: 32.1 g, Rfl: 2 .  Cholecalciferol (VITAMIN D-3) 25 MCG (1000 UT) CAPS, Take 1,000 Units by mouth at bedtime., Disp: , Rfl:  .  DALIRESP 500 MCG TABS tablet, TAKE 1 TABLET EVERY DAY (Patient taking differently: Take 500 mcg by mouth in the morning. ), Disp: 90 tablet, Rfl: 3 .  ELIQUIS 5 MG TABS tablet, TAKE 1 TABLET TWO TIMES DAILY (Patient taking differently: Take 5 mg by mouth 2 (two) times daily. ), Disp: 180 tablet, Rfl: 3 .  fish oil-omega-3 fatty acids 1000 MG capsule, Take 1 g by mouth 3 (three) times daily. , Disp: , Rfl:  .  flecainide (TAMBOCOR) 100 MG tablet, TAKE 1  TABLET TWO TIMES DAILY (Patient taking differently: Take 100 mg by mouth 2 (two) times daily. ), Disp: 180 tablet, Rfl: 1 .  furosemide (LASIX) 40 MG tablet, TAKE 1 TABLET TWICE DAILY IN THE MORNING AND AT 3 PM AS NEEDED FOR FLUID (Patient taking differently: Take 40 mg by mouth 2 (two) times daily. ), Disp: 180 tablet, Rfl: 1 .  ipratropium-albuterol (DUONEB) 0.5-2.5 (3) MG/3ML SOLN, Use every 6-8 hours as needed for wheezing and SOB. (Patient taking differently: Take 3 mLs by nebulization every 6 (six) hours as needed (for shortness of breath or wheezing). ), Disp: 360 mL, Rfl: 12 .  Linaclotide (LINZESS) 145 MCG CAPS capsule, Take 145 mcg by mouth daily before breakfast. , Disp: , Rfl:  .  Magnesium 500 MG TABS, Take 500 mg  by mouth at bedtime., Disp: , Rfl:  .  metoprolol succinate (TOPROL-XL) 50 MG 24 hr tablet,  Take 50 mg by mouth daily. Take with or immediately following a meal., Disp: , Rfl:  .  montelukast (SINGULAIR) 10 MG tablet, TAKE 1 TABLET AT BEDTIME (Patient taking differently: Take 10 mg by mouth at bedtime. ), Disp: 90 tablet, Rfl: 2 .  Multiple Vitamin (MULITIVITAMIN WITH MINERALS) TABS, Take 1 tablet by mouth daily. , Disp: , Rfl:  .  Multiple Vitamins-Minerals (ONE-A-DAY MENS 50+) TABS, Take 1 tablet by mouth daily with breakfast., Disp: , Rfl:  .  nitroGLYCERIN (NITROSTAT) 0.4 MG SL tablet, Place 0.4 mg under the tongue every 5 (five) minutes as needed for chest pain., Disp: , Rfl:  .  nystatin (MYCOSTATIN) 100000 UNIT/ML suspension, Take 5 mLs (500,000 Units total) by mouth 4 (four) times daily. (Patient taking differently: Take 5 mLs by mouth 4 (four) times daily as needed (AS DIRECTED). ), Disp: 60 mL, Rfl: 0 .  OXYGEN, Inhale 6-8 L/min into the lungs continuous. Inhale 6-7 L/min of oxygen continuously and increase to 8 L/min, if exerted, Disp: , Rfl:  .  potassium chloride (KLOR-CON) 10 MEQ tablet, TAKE 1 TABLET TWO TIMES DAILY AS NEEDED WITH FUROSEMIDE (Patient taking differently: Take 10 mEq by mouth 2 (two) times daily. ), Disp: 180 tablet, Rfl: 3 .  predniSONE (DELTASONE) 10 MG tablet, Take 4 tablets for 4 days, 3 tablets for 4 days, 2 tablets for 4 days, 1 tablet for 4 days then stop, Disp: 40 tablet, Rfl: 0 .  Respiratory Therapy Supplies (FLUTTER) DEVI, Use as directed, Disp: 1 each, Rfl: 0 .  rOPINIRole (REQUIP) 0.5 MG tablet, Take 1 tablet (0.5 mg total) by mouth daily. (Patient taking differently: Take 0.5 mg by mouth at bedtime. ), Disp: 30 tablet, Rfl: 0 .  sodium chloride HYPERTONIC 3 % nebulizer solution, Take by nebulization as needed for other. (Patient taking differently: Take 4 mLs by nebulization daily as needed (to loosen phlegm). ), Disp: 750 mL, Rfl: 12 .  sodium chloride HYPERTONIC 3 % nebulizer solution, , Disp: , Rfl:  .  temazepam (RESTORIL) 15 MG capsule, Take 15  mg by mouth at bedtime as needed for sleep. , Disp: , Rfl:  .  traMADol (ULTRAM) 50 MG tablet, TAKE 1 TABLET BY MOUTH THREE TIMES DAILY AS NEEDED FOR MODERATE PAIN (Patient taking differently: Take 50 mg by mouth See admin instructions. Take 50 mg by mouth at bedtime and an additional 50 mg up to two times a day as needed for anxiety), Disp: 90 tablet, Rfl: 0 .  vitamin C (ASCORBIC ACID) 500 MG tablet, Take 500 mg by mouth daily., Disp: , Rfl:  .  losartan-hydrochlorothiazide (HYZAAR) 100-25 MG tablet, Take 1 tablet by mouth daily., Disp: , Rfl:    Garner Nash, DO [facility] 09/09/2020 9:42 AM

## 2020-09-09 NOTE — Patient Instructions (Signed)
Thank you for visiting Dr. Valeta Harms at [facility]. Today we recommend the following:  Orders Placed This Encounter  Procedures  . Amb Referral to Palliative Care   Meds ordered this encounter  Medications  . albuterol (PROAIR HFA) 108 (90 Base) MCG/ACT inhaler    Sig: Inhale 2 puffs into the lungs every 4 (four) hours as needed for wheezing or shortness of breath.    Dispense:  24 g    Refill:  3   Return in about 3 months (around 12/10/2020) for Dr. Valeta Harms . or as needed     Please do your part to reduce the spread of COVID-19.

## 2020-09-12 DIAGNOSIS — R7309 Other abnormal glucose: Secondary | ICD-10-CM | POA: Diagnosis not present

## 2020-09-15 ENCOUNTER — Other Ambulatory Visit: Payer: Self-pay | Admitting: Primary Care

## 2020-09-16 ENCOUNTER — Encounter (HOSPITAL_COMMUNITY): Payer: Self-pay | Admitting: Emergency Medicine

## 2020-09-16 ENCOUNTER — Observation Stay (HOSPITAL_COMMUNITY)
Admission: EM | Admit: 2020-09-16 | Discharge: 2020-09-17 | Disposition: A | Discharge status: Home / Self Care | Payer: Medicare Other | Attending: Family Medicine | Admitting: Family Medicine

## 2020-09-16 ENCOUNTER — Emergency Department (HOSPITAL_COMMUNITY): Payer: Medicare Other

## 2020-09-16 ENCOUNTER — Other Ambulatory Visit: Payer: Self-pay

## 2020-09-16 DIAGNOSIS — J9622 Acute and chronic respiratory failure with hypercapnia: Secondary | ICD-10-CM

## 2020-09-16 DIAGNOSIS — Z789 Other specified health status: Secondary | ICD-10-CM

## 2020-09-16 DIAGNOSIS — J9691 Respiratory failure, unspecified with hypoxia: Principal | ICD-10-CM | POA: Insufficient documentation

## 2020-09-16 DIAGNOSIS — K922 Gastrointestinal hemorrhage, unspecified: Secondary | ICD-10-CM | POA: Insufficient documentation

## 2020-09-16 DIAGNOSIS — Z515 Encounter for palliative care: Secondary | ICD-10-CM

## 2020-09-16 DIAGNOSIS — Z87891 Personal history of nicotine dependence: Secondary | ICD-10-CM | POA: Diagnosis not present

## 2020-09-16 DIAGNOSIS — J439 Emphysema, unspecified: Secondary | ICD-10-CM | POA: Diagnosis not present

## 2020-09-16 DIAGNOSIS — Z9981 Dependence on supplemental oxygen: Secondary | ICD-10-CM | POA: Diagnosis not present

## 2020-09-16 DIAGNOSIS — J9621 Acute and chronic respiratory failure with hypoxia: Secondary | ICD-10-CM | POA: Diagnosis not present

## 2020-09-16 DIAGNOSIS — J441 Chronic obstructive pulmonary disease with (acute) exacerbation: Secondary | ICD-10-CM

## 2020-09-16 DIAGNOSIS — D649 Anemia, unspecified: Secondary | ICD-10-CM

## 2020-09-16 DIAGNOSIS — Z85528 Personal history of other malignant neoplasm of kidney: Secondary | ICD-10-CM | POA: Insufficient documentation

## 2020-09-16 DIAGNOSIS — J449 Chronic obstructive pulmonary disease, unspecified: Secondary | ICD-10-CM | POA: Insufficient documentation

## 2020-09-16 DIAGNOSIS — I10 Essential (primary) hypertension: Secondary | ICD-10-CM | POA: Diagnosis not present

## 2020-09-16 DIAGNOSIS — Z7952 Long term (current) use of systemic steroids: Secondary | ICD-10-CM | POA: Diagnosis not present

## 2020-09-16 DIAGNOSIS — I110 Hypertensive heart disease with heart failure: Secondary | ICD-10-CM | POA: Diagnosis not present

## 2020-09-16 DIAGNOSIS — I4891 Unspecified atrial fibrillation: Secondary | ICD-10-CM | POA: Diagnosis present

## 2020-09-16 DIAGNOSIS — Z7189 Other specified counseling: Secondary | ICD-10-CM

## 2020-09-16 DIAGNOSIS — Z20822 Contact with and (suspected) exposure to covid-19: Secondary | ICD-10-CM | POA: Insufficient documentation

## 2020-09-16 DIAGNOSIS — Z7901 Long term (current) use of anticoagulants: Secondary | ICD-10-CM | POA: Insufficient documentation

## 2020-09-16 DIAGNOSIS — R0602 Shortness of breath: Secondary | ICD-10-CM

## 2020-09-16 DIAGNOSIS — D62 Acute posthemorrhagic anemia: Secondary | ICD-10-CM | POA: Diagnosis present

## 2020-09-16 DIAGNOSIS — R000 Tachycardia, unspecified: Secondary | ICD-10-CM | POA: Diagnosis not present

## 2020-09-16 DIAGNOSIS — R0902 Hypoxemia: Secondary | ICD-10-CM | POA: Diagnosis not present

## 2020-09-16 DIAGNOSIS — Z79899 Other long term (current) drug therapy: Secondary | ICD-10-CM | POA: Insufficient documentation

## 2020-09-16 DIAGNOSIS — I5030 Unspecified diastolic (congestive) heart failure: Secondary | ICD-10-CM | POA: Diagnosis not present

## 2020-09-16 DIAGNOSIS — Z66 Do not resuscitate: Secondary | ICD-10-CM | POA: Diagnosis not present

## 2020-09-16 DIAGNOSIS — N1831 Chronic kidney disease, stage 3a: Secondary | ICD-10-CM | POA: Diagnosis present

## 2020-09-16 LAB — LACTIC ACID, PLASMA
Lactic Acid, Venous: 2.1 mmol/L (ref 0.5–1.9)
Lactic Acid, Venous: 1.8 mmol/L (ref 0.5–1.9)

## 2020-09-16 LAB — CBC WITH DIFFERENTIAL/PLATELET
Abs Immature Granulocytes: 0.17 10*3/uL — ABNORMAL HIGH (ref 0.00–0.07)
Basophils Absolute: 0.1 10*3/uL (ref 0.0–0.1)
Basophils Relative: 0 %
Eosinophils Absolute: 0.1 10*3/uL (ref 0.0–0.5)
Eosinophils Relative: 0 %
HCT: 27.4 % — ABNORMAL LOW (ref 39.0–52.0)
Hemoglobin: 7.1 g/dL — ABNORMAL LOW (ref 13.0–17.0)
Immature Granulocytes: 1 %
Lymphocytes Relative: 5 %
Lymphs Abs: 1.1 10*3/uL (ref 0.7–4.0)
MCH: 21.6 pg — ABNORMAL LOW (ref 26.0–34.0)
MCHC: 25.9 g/dL — ABNORMAL LOW (ref 30.0–36.0)
MCV: 83.5 fL (ref 80.0–100.0)
Monocytes Absolute: 1.6 10*3/uL — ABNORMAL HIGH (ref 0.1–1.0)
Monocytes Relative: 7 %
Neutro Abs: 19.1 10*3/uL — ABNORMAL HIGH (ref 1.7–7.7)
Neutrophils Relative %: 87 %
Platelets: 292 10*3/uL (ref 150–400)
RBC: 3.28 MIL/uL — ABNORMAL LOW (ref 4.22–5.81)
RDW: 17.6 % — ABNORMAL HIGH (ref 11.5–15.5)
WBC: 22.1 10*3/uL — ABNORMAL HIGH (ref 4.0–10.5)
nRBC: 0 % (ref 0.0–0.2)

## 2020-09-16 LAB — I-STAT ARTERIAL BLOOD GAS, ED
Acid-Base Excess: 20 mmol/L — ABNORMAL HIGH (ref 0.0–2.0)
Bicarbonate: 48.5 mmol/L — ABNORMAL HIGH (ref 20.0–28.0)
Calcium, Ion: 1.22 mmol/L (ref 1.15–1.40)
HCT: 23 % — ABNORMAL LOW (ref 39.0–52.0)
Hemoglobin: 7.8 g/dL — ABNORMAL LOW (ref 13.0–17.0)
O2 Saturation: 94 %
Patient temperature: 98.4
Potassium: 3.5 mmol/L (ref 3.5–5.1)
Sodium: 141 mmol/L (ref 135–145)
TCO2: >50 mmol/L — ABNORMAL HIGH (ref 22–32)
pCO2 arterial: 91.2 mmHg (ref 32.0–48.0)
pH, Arterial: 7.334 — ABNORMAL LOW (ref 7.350–7.450)
pO2, Arterial: 80 mmHg — ABNORMAL LOW (ref 83.0–108.0)

## 2020-09-16 LAB — BRAIN NATRIURETIC PEPTIDE: B Natriuretic Peptide: 175.8 pg/mL — ABNORMAL HIGH (ref 0.0–100.0)

## 2020-09-16 LAB — BASIC METABOLIC PANEL
Anion gap: 10 (ref 5–15)
BUN: 21 mg/dL (ref 8–23)
CO2: 41 mmol/L — ABNORMAL HIGH (ref 22–32)
Calcium: 9.4 mg/dL (ref 8.9–10.3)
Chloride: 91 mmol/L — ABNORMAL LOW (ref 98–111)
Creatinine, Ser: 1.5 mg/dL — ABNORMAL HIGH (ref 0.61–1.24)
GFR, Estimated: 49 mL/min — ABNORMAL LOW (ref >60)
Glucose, Bld: 204 mg/dL — ABNORMAL HIGH (ref 70–99)
Potassium: 3.6 mmol/L (ref 3.5–5.1)
Sodium: 142 mmol/L (ref 135–145)

## 2020-09-16 LAB — PREPARE RBC (CROSSMATCH)

## 2020-09-16 LAB — TROPONIN I (HIGH SENSITIVITY)
Troponin I (High Sensitivity): 13 ng/L (ref <18)
Troponin I (High Sensitivity): 18 ng/L — ABNORMAL HIGH (ref <18)

## 2020-09-16 LAB — RESPIRATORY PANEL BY RT PCR (FLU A&B, COVID)
Influenza A by PCR: NEGATIVE
Influenza B by PCR: NEGATIVE
SARS Coronavirus 2 by RT PCR: NEGATIVE

## 2020-09-16 LAB — POC OCCULT BLOOD, ED: Fecal Occult Bld: POSITIVE — AB

## 2020-09-16 MED ORDER — GLYCOPYRROLATE 0.2 MG/ML IJ SOLN: 0.2000 mg | INTRAMUSCULAR | Status: DC | PRN

## 2020-09-16 MED ORDER — UMECLIDINIUM BROMIDE 62.5 MCG/INH IN AEPB
1.0000 | INHALATION_SPRAY | Freq: Every day | RESPIRATORY_TRACT | Status: DC
  Filled 2020-09-16: qty 7

## 2020-09-16 MED ORDER — AZELASTINE HCL 0.1 % NA SOLN
1.0000 | Freq: Two times a day (BID) | NASAL | Status: DC
  Administered 2020-09-16: 1 via NASAL
  Filled 2020-09-16: qty 30

## 2020-09-16 MED ORDER — POLYVINYL ALCOHOL 1.4 % OP SOLN
1.0000 [drp] | Freq: Four times a day (QID) | OPHTHALMIC | Status: DC | PRN
  Filled 2020-09-16: qty 15

## 2020-09-16 MED ORDER — ONDANSETRON 4 MG PO TBDP: 4.0000 mg | ORAL_TABLET | Freq: Four times a day (QID) | ORAL | Status: DC | PRN

## 2020-09-16 MED ORDER — HALOPERIDOL 0.5 MG PO TABS
0.5000 mg | ORAL_TABLET | ORAL | Status: DC | PRN
  Filled 2020-09-16: qty 1

## 2020-09-16 MED ORDER — HALOPERIDOL LACTATE 5 MG/ML IJ SOLN: 0.5000 mg | INTRAMUSCULAR | Status: DC | PRN

## 2020-09-16 MED ORDER — METOPROLOL SUCCINATE ER 25 MG PO TB24: 50.0000 mg | ORAL_TABLET | Freq: Every day | ORAL | Status: DC

## 2020-09-16 MED ORDER — GLYCOPYRROLATE 1 MG PO TABS
1.0000 mg | ORAL_TABLET | ORAL | Status: DC | PRN
  Filled 2020-09-16: qty 1

## 2020-09-16 MED ORDER — AMLODIPINE BESYLATE 5 MG PO TABS: 10.0000 mg | ORAL_TABLET | Freq: Every day | ORAL | Status: DC

## 2020-09-16 MED ORDER — HALOPERIDOL LACTATE 2 MG/ML PO CONC
0.5000 mg | ORAL | Status: DC | PRN
  Filled 2020-09-16: qty 0.3

## 2020-09-16 MED ORDER — SODIUM CHLORIDE 0.9 % IV SOLN
8.0000 mg/h | INTRAVENOUS | Status: DC
  Administered 2020-09-16: 8 mg/h via INTRAVENOUS
  Filled 2020-09-16: qty 80

## 2020-09-16 MED ORDER — ROPINIROLE HCL 0.5 MG PO TABS
0.5000 mg | ORAL_TABLET | Freq: Every day | ORAL | Status: DC
  Filled 2020-09-16: qty 1

## 2020-09-16 MED ORDER — LORAZEPAM 2 MG/ML IJ SOLN
1.0000 mg | INTRAMUSCULAR | Status: DC | PRN
  Administered 2020-09-16: 1 mg via INTRAVENOUS
  Filled 2020-09-16: qty 1

## 2020-09-16 MED ORDER — SENNA 8.6 MG PO TABS: 1.0000 | ORAL_TABLET | Freq: Every evening | ORAL | Status: DC | PRN

## 2020-09-16 MED ORDER — ALBUTEROL SULFATE HFA 108 (90 BASE) MCG/ACT IN AERS
2.0000 | INHALATION_SPRAY | RESPIRATORY_TRACT | Status: DC | PRN
  Filled 2020-09-16: qty 6.7

## 2020-09-16 MED ORDER — ONDANSETRON HCL 4 MG/2ML IJ SOLN: 4.0000 mg | Freq: Four times a day (QID) | INTRAMUSCULAR | Status: DC | PRN

## 2020-09-16 MED ORDER — ATORVASTATIN CALCIUM 10 MG PO TABS: 20.0000 mg | ORAL_TABLET | Freq: Every day | ORAL | Status: DC

## 2020-09-16 MED ORDER — FLECAINIDE ACETATE 100 MG PO TABS
100.0000 mg | ORAL_TABLET | Freq: Two times a day (BID) | ORAL | Status: DC
  Filled 2020-09-16 (×2): qty 1

## 2020-09-16 MED ORDER — ROFLUMILAST 500 MCG PO TABS
500.0000 ug | ORAL_TABLET | Freq: Every morning | ORAL | Status: DC
  Filled 2020-09-16: qty 1

## 2020-09-16 MED ORDER — LORAZEPAM 1 MG PO TABS: 1.0000 mg | ORAL_TABLET | ORAL | Status: DC | PRN

## 2020-09-16 MED ORDER — AMLODIPINE BESYLATE 10 MG PO TABS: 10.0000 mg | ORAL_TABLET | Freq: Every day | ORAL | Status: DC

## 2020-09-16 MED ORDER — SODIUM CHLORIDE 0.9% IV SOLUTION: Freq: Once | INTRAVENOUS | Status: AC

## 2020-09-16 MED ORDER — SODIUM CHLORIDE 0.9 % IV SOLN
80.0000 mg | Freq: Once | INTRAVENOUS | Status: AC
  Administered 2020-09-16: 11:00:00 80 mg via INTRAVENOUS
  Filled 2020-09-16: qty 80

## 2020-09-16 MED ORDER — ACETAMINOPHEN 650 MG RE SUPP: 650.0000 mg | Freq: Four times a day (QID) | RECTAL | Status: DC | PRN

## 2020-09-16 MED ORDER — TRAMADOL HCL 50 MG PO TABS: 50.0000 mg | ORAL_TABLET | Freq: Three times a day (TID) | ORAL | Status: DC | PRN

## 2020-09-16 MED ORDER — MORPHINE BOLUS VIA INFUSION
3.0000 mg | INTRAVENOUS | Status: DC | PRN
  Filled 2020-09-16: qty 6

## 2020-09-16 MED ORDER — FLUTICASONE FUROATE-VILANTEROL 200-25 MCG/INH IN AEPB
1.0000 | INHALATION_SPRAY | Freq: Every day | RESPIRATORY_TRACT | Status: DC
  Filled 2020-09-16: qty 28

## 2020-09-16 MED ORDER — MORPHINE SULFATE (PF) 2 MG/ML IV SOLN: 1.0000 mg | INTRAVENOUS | Status: DC | PRN

## 2020-09-16 MED ORDER — LOSARTAN POTASSIUM-HCTZ 100-25 MG PO TABS: 1.0000 | ORAL_TABLET | Freq: Every day | ORAL | Status: DC

## 2020-09-16 MED ORDER — MONTELUKAST SODIUM 10 MG PO TABS: 10.0000 mg | ORAL_TABLET | Freq: Every day | ORAL | Status: DC

## 2020-09-16 MED ORDER — MORPHINE 100MG IN NS 100ML (1MG/ML) PREMIX INFUSION
5.0000 mg/h | INTRAVENOUS | Status: DC
  Administered 2020-09-16: 5 mg/h via INTRAVENOUS
  Filled 2020-09-16 (×2): qty 100

## 2020-09-16 MED ORDER — ALBUTEROL (5 MG/ML) CONTINUOUS INHALATION SOLN
10.0000 mg/h | INHALATION_SOLUTION | Freq: Once | RESPIRATORY_TRACT | Status: AC
  Administered 2020-09-16: 10 mg/h via RESPIRATORY_TRACT
  Filled 2020-09-16: qty 20

## 2020-09-16 MED ORDER — TEMAZEPAM 15 MG PO CAPS: 15.0000 mg | ORAL_CAPSULE | Freq: Every evening | ORAL | Status: DC | PRN

## 2020-09-16 MED ORDER — IPRATROPIUM-ALBUTEROL 0.5-2.5 (3) MG/3ML IN SOLN: 3.0000 mL | Freq: Four times a day (QID) | RESPIRATORY_TRACT | Status: DC | PRN

## 2020-09-16 MED ORDER — LINACLOTIDE 145 MCG PO CAPS
145.0000 ug | ORAL_CAPSULE | Freq: Every day | ORAL | Status: DC
  Filled 2020-09-16: qty 1

## 2020-09-16 MED ORDER — ACETAMINOPHEN 325 MG PO TABS: 650.0000 mg | ORAL_TABLET | Freq: Four times a day (QID) | ORAL | Status: DC | PRN

## 2020-09-16 MED ORDER — ALLOPURINOL 100 MG PO TABS: 300.0000 mg | ORAL_TABLET | Freq: Every day | ORAL | Status: DC

## 2020-09-16 MED ORDER — FUROSEMIDE 40 MG PO TABS
40.0000 mg | ORAL_TABLET | Freq: Two times a day (BID) | ORAL | Status: DC
  Filled 2020-09-16: qty 1

## 2020-09-16 MED ORDER — SODIUM CHLORIDE 3 % IN NEBU
4.0000 mL | INHALATION_SOLUTION | Freq: Every day | RESPIRATORY_TRACT | Status: DC | PRN
  Filled 2020-09-16: qty 4

## 2020-09-16 MED ORDER — BIOTENE DRY MOUTH MT LIQD: 15.0000 mL | OROMUCOSAL | Status: DC | PRN

## 2020-09-16 MED ORDER — BISACODYL 10 MG RE SUPP: 10.0000 mg | Freq: Every day | RECTAL | Status: DC | PRN

## 2020-09-16 MED ORDER — ALPRAZOLAM 0.5 MG PO TABS
0.5000 mg | ORAL_TABLET | Freq: Every day | ORAL | Status: DC
  Filled 2020-09-16: qty 1

## 2020-09-16 MED ORDER — LORAZEPAM 2 MG/ML PO CONC: 1.0000 mg | ORAL | Status: DC | PRN

## 2020-09-16 MED ORDER — BUDESON-GLYCOPYRROL-FORMOTEROL 160-9-4.8 MCG/ACT IN AERO: 2.0000 | INHALATION_SPRAY | Freq: Two times a day (BID) | RESPIRATORY_TRACT | Status: DC

## 2020-09-16 MED ORDER — AZITHROMYCIN 250 MG PO TABS: 250.0000 mg | ORAL_TABLET | ORAL | Status: DC

## 2020-09-16 NOTE — ED Notes (Signed)
Pt refusing medications at this time blood transfusion and protonix stopped at this time. Pt also requesting to be taken off of vital signs monitor. Pt moved to recliner per pt request. Dr. Lorin Mercy notified.

## 2020-09-16 NOTE — ED Provider Notes (Signed)
[facility] Provider Note   CSN: [id number] Arrival date & time: 09/21/2020  4656     History Chief Complaint  Patient presents with  . Shortness of Breath    Harold Hall is a 74 y.o. male.  Pt presents to the ED today with SOB.  Pt has end stage COPD.  He is on 8-12L at home via [place].  The pt is supposed to be on bipap at night, but has been unable to tolerate this treatment.  He last saw pulmonology on 11/1.  A palliative care consultation has been ordered.  Appointment made for 11/10.  Pt was made DNR.  Pt was last admitted to the hospital from 9/26-29 for a COPD exacerbation.  He has been on triple inhaler therapy plus azithromycin MWF.  They had spoken to him about daily prednisone for palliation, but he declined.  He has been on a recent taper, however.  Pt given 1 neb by EMS en route.  He did not want me to give him solumedrol.  Per EMS, his O2 sat on 8L was 84%.  They put him on a 15L NRB.  Pt did not want to keep the mask on and O2 sats dropped as low as 36% when he took off his mask.  Pt is so sob he is unable to give any history.         Past Medical History:  Diagnosis Date  . Allergic rhinitis   . Arthritis   . Cancer [facility])    left renal -solitary kidney  . COPD (chronic obstructive pulmonary disease) ([place])   . Erectile dysfunction   . Gout   . Hernia   . History of kidney cancer   . Hypercholesterolemia   . Hypertension   . RLS (restless legs syndrome)   . Sinus problem     Patient Active Problem List   Diagnosis Date Noted  . AKI (acute kidney injury) ([place]) 08/07/2020  . COPD (chronic obstructive pulmonary disease) ([place]) 08/04/2020  . Diastolic CHF ([place]) 81/27/5170  . Thrush, oral 06/13/2019  . Medication management 06/13/2019  . Therapeutic drug monitoring 06/13/2019  . Edema 04/26/2018  . Atrial fibrillation ([place]) 04/21/2016  . Dyspnea 04/21/2016  . Chronic respiratory failure with hypoxia ([place]) 10/24/2015    . COPD exacerbation ([place]) 10/22/2011  . Pulmonary nodule 05/19/2011  . Abdominal pain, left upper quadrant at site of previous nephrectomy 1994 05/06/2011  . Umbilical hernia 01/74/9449  . Inguinal hernia bilateral, non-recurrent 05/06/2011  . RESTLESS LEGS SYNDROME 10/06/2010  . 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 CARDIAC CATHETERIZATION    . HERNIA REPAIR  06/04/11  . left kidney removed  1994       Family History  Problem Relation Age of Onset  . COPD Mother   . Hypertension Mother   . Osteoporosis Mother   . Prostate cancer Father   . Cancer Father        prostate    Social History   Tobacco Use  . Smoking status: Former Smoker    Packs/day: 2.00    Years: 50.00    Pack years: 100.00    Types: Cigarettes    Quit date: 06/04/2011    Years since quitting: 9.2  . Smokeless tobacco: Never Used  Vaping Use  . Vaping Use: Never used  Substance Use Topics  . Alcohol use: Yes  Alcohol/week: 2.0 - 3.0 standard drinks    Types: 2 - 3 Cans of beer per week    Comment: beers  . Drug use: No    Home Medications Prior to Admission medications   Medication Sig Start Date End Date Taking? Authorizing Provider  albuterol (PROAIR HFA) 108 (90 Base) MCG/ACT inhaler Inhale 2 puffs into the lungs every 4 (four) hours as needed for wheezing or shortness of breath. 09/09/20  Yes Icard, Octavio Graves, DO  allopurinol (ZYLOPRIM) 300 MG tablet Take 300 mg by mouth daily.   Yes [provider]  ALPRAZolam (XANAX) 0.5 MG tablet Take 1 tablet (0.5 mg total) by mouth 2 (two) times daily. Patient taking differently: Take 0.5 mg by mouth at bedtime.  08/24/14  Yes Clance, Armando Reichert, MD  amLODipine (NORVASC) 10 MG tablet Take 10 mg by mouth daily.    Yes [provider]  atorvastatin (LIPITOR) 20 MG tablet Take 20 mg by mouth daily.   Yes [provider]  azelastine  (ASTELIN) 0.1 % nasal spray Place 1 spray into both nostrils 2 (two) times daily.  03/20/15  Yes [provider]  azithromycin (ZITHROMAX) 250 MG tablet Take Monday, Wednesday, Friday only with food. Patient taking differently: Take 250 mg by mouth every Monday, Wednesday, and Friday.  11/29/19  Yes Lauraine Rinne, NP  Budeson-Glycopyrrol-Formoterol (BREZTRI AEROSPHERE) 160-9-4.8 MCG/ACT AERO Inhale 2 puffs into the lungs in the morning and at bedtime. Patient taking differently: Inhale 2 puffs into the lungs 2 (two) times daily.  04/15/20  Yes Lauraine Rinne, NP  Cholecalciferol (VITAMIN D-3) 25 MCG (1000 UT) CAPS Take 1,000 Units by mouth at bedtime.   Yes [provider]  DALIRESP 500 MCG TABS tablet TAKE 1 TABLET EVERY DAY Patient taking differently: Take 500 mcg by mouth in the morning.  10/20/19  Yes Martyn Ehrich, NP  ELIQUIS 5 MG TABS tablet TAKE 1 TABLET TWO TIMES DAILY Patient taking differently: Take 5 mg by mouth 2 (two) times daily.  03/05/20  Yes Adrian Prows, MD  fish oil-omega-3 fatty acids 1000 MG capsule Take 1 g by mouth 3 (three) times daily.    Yes [provider]  flecainide (TAMBOCOR) 100 MG tablet TAKE 1  TABLET TWO TIMES DAILY Patient taking differently: Take 100 mg by mouth 2 (two) times daily.  06/12/20  Yes Adrian Prows, MD  furosemide (LASIX) 40 MG tablet TAKE 1 TABLET TWICE DAILY IN THE MORNING AND AT 3 PM AS NEEDED FOR FLUID Patient taking differently: Take 40 mg by mouth 2 (two) times daily.  04/11/20  Yes Adrian Prows, MD  ipratropium-albuterol (DUONEB) 0.5-2.5 (3) MG/3ML SOLN Use every 6-8 hours as needed for wheezing and SOB. Patient taking differently: Take 3 mLs by nebulization every 6 (six) hours as needed (for shortness of breath or wheezing).  06/06/20  Yes Lauraine Rinne, NP  Linaclotide (LINZESS) 145 MCG CAPS capsule Take 145 mcg by mouth daily before breakfast.    Yes [provider]  losartan-hydrochlorothiazide (HYZAAR) 100-25 MG  tablet Take 1 tablet by mouth daily. 08/08/20  Yes [provider]  Magnesium 500 MG TABS Take 500 mg by mouth at bedtime.   Yes [provider]  metoprolol succinate (TOPROL-XL) 50 MG 24 hr tablet Take 50 mg by mouth daily. Take with or immediately following a meal.   Yes [provider]  montelukast (SINGULAIR) 10 MG tablet TAKE 1 TABLET AT BEDTIME Patient taking differently: Take 10  mg by mouth at bedtime.  04/11/20  Yes Lauraine Rinne, NP  Multiple Vitamins-Minerals (ONE-A-DAY MENS 50+) TABS Take 1 tablet by mouth daily with breakfast.   Yes [provider]  nitroGLYCERIN (NITROSTAT) 0.4 MG SL tablet Place 0.4 mg under the tongue every 5 (five) minutes as needed for chest pain.   Yes [provider]  OXYGEN Inhale 6-8 L/min into the lungs continuous. Inhale 6-7 L/min of oxygen continuously and increase to 8 L/min, if exerted   Yes [provider]  potassium chloride (KLOR-CON) 10 MEQ tablet TAKE 1 TABLET TWO TIMES DAILY AS NEEDED WITH FUROSEMIDE Patient taking differently: Take 10 mEq by mouth 2 (two) times daily.  06/12/20  Yes Adrian Prows, MD  rOPINIRole (REQUIP) 0.5 MG tablet Take 1 tablet (0.5 mg total) by mouth daily. Patient taking differently: Take 0.5 mg by mouth at bedtime.  04/05/13  Yes Clance, Armando Reichert, MD  sodium chloride HYPERTONIC 3 % nebulizer solution Take by nebulization as needed for other. Patient taking differently: Take 4 mLs by nebulization daily as needed (to loosen phlegm).  06/06/20  Yes Lauraine Rinne, NP  temazepam (RESTORIL) 15 MG capsule Take 15 mg by mouth at bedtime as needed for sleep.    Yes [provider]  traMADol (ULTRAM) 50 MG tablet TAKE 1 TABLET BY MOUTH THREE TIMES DAILY AS NEEDED FOR MODERATE PAIN Patient taking differently: Take 50 mg by mouth See admin instructions. Take 50 mg by mouth at bedtime and an additional 50 mg up to two times a day as needed for anxiety 01/26/20  Yes Adrian Prows, MD  vitamin  C (ASCORBIC ACID) 500 MG tablet Take 500 mg by mouth daily.   Yes [provider]  nystatin (MYCOSTATIN) 100000 UNIT/ML suspension Take 5 mLs (500,000 Units total) by mouth 4 (four) times daily. Patient not taking: Reported on 09/11/2020 03/27/20   Lauraine Rinne, NP  predniSONE (DELTASONE) 10 MG tablet Take 4 tablets for 4 days, 3 tablets for 4 days, 2 tablets for 4 days, 1 tablet for 4 days then stop Patient not taking: Reported on 09/21/2020 08/27/20   Garner Nash, DO  Respiratory Therapy Supplies (FLUTTER) DEVI Use as directed Patient not taking: Reported on 09/27/2020 05/10/18   Lauraine Rinne, NP  sodium chloride HYPERTONIC 3 % nebulizer solution  06/06/20   [provider]    Allergies    Patient has no known allergies.  Review of Systems   Review of Systems  Unable to perform ROS: Severe respiratory distress  Respiratory: Positive for shortness of breath.   All other systems reviewed and are negative.   Physical Exam Updated Vital Signs BP (!) 125/44   Pulse (!) 107   Temp 98.4 F (36.9 C) (Oral)   Resp (!) 26   SpO2 98%   Physical Exam Vitals and nursing note reviewed. Exam conducted with a chaperone present.  Constitutional:      Appearance: He is well-developed.  HENT:     Head: Normocephalic and atraumatic.     Mouth/Throat:     Mouth: Mucous membranes are moist.     Pharynx: Oropharynx is clear.  Eyes:     Extraocular Movements: Extraocular movements intact.     Pupils: Pupils are equal, round, and reactive to light.  Cardiovascular:     Rate and Rhythm: Normal rate and regular rhythm.  Pulmonary:     Effort: Tachypnea, accessory muscle usage and respiratory distress present.  Breath sounds: Decreased breath sounds present.  Abdominal:     General: Bowel sounds are normal.     Palpations: Abdomen is soft.  Genitourinary:    Rectum: Guaiac result positive.     Comments: Stool is black Musculoskeletal:        General: Normal range of  motion.     Cervical back: Normal range of motion and neck supple.     Right lower leg: Edema present.     Left lower leg: Edema present.  Skin:    General: Skin is warm and dry.     Capillary Refill: Capillary refill takes less than 2 seconds.  Neurological:     General: No focal deficit present.     Mental Status: He is alert.  Psychiatric:        Mood and Affect: Mood normal.        Behavior: Behavior normal.     ED Results / Procedures / Treatments   Labs (all labs ordered are listed, but only abnormal results are displayed) Labs Reviewed  BASIC METABOLIC PANEL - Abnormal; Notable for the following components:      Result Value   Chloride 91 (*)    CO2 41 (*)    Glucose, Bld 204 (*)    Creatinine, Ser 1.50 (*)    GFR, Estimated 49 (*)    All other components within normal limits  CBC WITH DIFFERENTIAL/PLATELET - Abnormal; Notable for the following components:   WBC 22.1 (*)    RBC 3.28 (*)    Hemoglobin 7.1 (*)    HCT 27.4 (*)    MCH 21.6 (*)    MCHC 25.9 (*)    RDW 17.6 (*)    Neutro Abs 19.1 (*)    Monocytes Absolute 1.6 (*)    Abs Immature Granulocytes 0.17 (*)    All other components within normal limits  BRAIN NATRIURETIC PEPTIDE - Abnormal; Notable for the following components:   B Natriuretic Peptide 175.8 (*)    All other components within normal limits  LACTIC ACID, PLASMA - Abnormal; Notable for the following components:   Lactic Acid, Venous 2.1 (*)    All other components within normal limits  I-STAT ARTERIAL BLOOD GAS, ED - Abnormal; Notable for the following components:   pH, Arterial 7.334 (*)    pCO2 arterial 91.2 (*)    pO2, Arterial 80 (*)    Bicarbonate 48.5 (*)    TCO2 >50 (*)    Acid-Base Excess 20.0 (*)    HCT 23.0 (*)    Hemoglobin 7.8 (*)    All other components within normal limits  POC OCCULT BLOOD, ED - Abnormal; Notable for the following components:   Fecal Occult Bld POSITIVE (*)    All other components within normal limits    RESPIRATORY PANEL BY RT PCR (FLU A&B, COVID)  CULTURE, BLOOD (ROUTINE X 2)  CULTURE, BLOOD (ROUTINE X 2)  LACTIC ACID, PLASMA  TYPE AND SCREEN  PREPARE RBC (CROSSMATCH)  TROPONIN I (HIGH SENSITIVITY)  TROPONIN I (HIGH SENSITIVITY)    EKG EKG Interpretation  Date/Time:  Monday September 16 2020 08:38:50 EST Ventricular Rate:  112 PR Interval:    QRS Duration: 107 QT Interval:  449 QTC Calculation: 613 R Axis:   -17 Text Interpretation: Sinus tachycardia Inferior infarct, acute (RCA) Prolonged QT interval Probable RV involvement, suggest recording right precordial leads No significant change since last tracing Confirmed by Isla Pence [phone]) on 09/10/2020 8:46:04 AM   Radiology DG  Chest Port 1 View  Result Date: 09/24/2020 CLINICAL DATA:  Shortness of breath. EXAM: PORTABLE CHEST 1 VIEW COMPARISON:  08/04/2020 chest radiograph and prior. FINDINGS: Emphysematous changes. Diffuse interstitial prominence. Patchy bibasilar opacities, unchanged. No pneumothorax or pleural effusion. Cardiomediastinal silhouette is similar to prior exam. No acute osseous abnormality. IMPRESSION: Emphysema. Stable appearance of bibasilar patchy opacities however cannot exclude underlying infection. Electronically Signed   By: Primitivo Gauze M.D.   On: 09/30/2020 09:20    Procedures Procedures (including critical care time)  Medications Ordered in ED Medications  0.9 %  sodium chloride infusion (Manually program via Guardrails IV Fluids) (has no administration in time range)  pantoprazole (PROTONIX) 80 mg in sodium chloride 0.9 % 100 mL IVPB (has no administration in time range)  pantoprazole (PROTONIX) 80 mg in sodium chloride 0.9 % 100 mL (0.8 mg/mL) infusion (has no administration in time range)  albuterol (PROVENTIL,VENTOLIN) solution continuous neb (10 mg/hr Nebulization Given 10/02/2020 3154)    ED Course  I have reviewed the triage vital signs and the nursing notes.  Pertinent labs &  imaging results that were available during my care of the patient were reviewed by me and considered in my medical decision making (see chart for details).    MDM Rules/Calculators/A&P                          Pt refused bipap.  He is refusing steroids.  He is refusing nebs.  He said he wants to wait until his wife gets here.  Pt's O2 sat dropped into the 70s while getting his neb.  He was put back on the NRB.  He agreed to try the bipap.  His wife is here now.  I told her and patient that he is anemic.  They are willing to receive blood.  GI bleed is likely due to steroid use.  Pt d/w Dr. Paulita Fujita [facility]).  He does not think pt would tolerate a scope due to his severe COPD.  He recommended medical management.  Pt and wife updated and understand this decision.  Pt d/w Dr. Lorin Mercy ([facility]) for admission.  CRITICAL CARE Performed by: Isla Pence   Total critical care time: 60 minutes  Critical care time was exclusive of separately billable procedures and treating other patients.  Critical care was necessary to treat or prevent imminent or life-threatening deterioration.  Critical care was time spent personally by me on the following activities: development of treatment plan with patient and/or surrogate as well as nursing, discussions with consultants, evaluation of patient's response to treatment, examination of patient, obtaining history from patient or surrogate, ordering and performing treatments and interventions, ordering and review of laboratory studies, ordering and review of radiographic studies, pulse oximetry and re-evaluation of patient's condition.    Final Clinical Impression(s) / ED Diagnoses Final diagnoses:  COPD exacerbation ([place])  Acute on chronic respiratory failure with hypoxia and hypercapnia ([facility])  Symptomatic anemia  Acute upper GI bleed    Rx / DC Orders ED Discharge Orders    None       Isla Pence, MD 09/20/2020 1110

## 2020-09-16 NOTE — Consult Note (Signed)
Consultation Note Date: 09/25/2020   Patient Name: Harold Hall  DOB: 1946-06-29  MRN: [id number]  Age / Sex: 74 y.o., male  PCP: Alroy Dust, L.[name], MD Referring Physician: Karmen Bongo, MD  Reason for Consultation: Disposition, Non pain symptom management, Pain control, Psychosocial/spiritual support and Terminal Care  HPI/Patient Profile: 74 y.o. male  with past medical history of left renal cancer, arthritis, end stage COPD was seen in the ED on 09/13/2020 for shortness of breath at home x3 weeks.   ED Course: End-stage COPD, on 8-12L home O2.  Recently seen by pulmonology and referred to palliative care, made DNR.  SOB for a few days.  pCO2 91, pH 7.3 - so maybe baseline.  Doing much better on BIPAP.  Also with GI bleed, likely from steroid use.  Hgb 7, agrees to blood.  Dr. Paulita Fujita says not a candidate for EGD, needs medical management.  Patient and family face treatment option decisions, advanced directive decisions, and anticipatory care needs.  Clinical Assessment and Goals of Care: I have reviewed medical records including EPIC notes, labs, and imaging. Received report from primary RN - no acute concerns.   Went to visit patient at bedside - wife/Melissa was present. Patient was sitting up in chair awake, alert, oriented, and able to participate in conversation. No signs or non-verbal gestures of pain or discomfort noted. No respiratory distress, increased work of breathing, or secretions noted.   Met with patient and wife  to discuss diagnosis, prognosis, GOC, EOL wishes, disposition, and options.  I introduced Palliative Medicine as specialized medical care for people living with serious illness. It focuses on providing relief from the symptoms and stress of a serious illness. The goal is to improve quality of life for both the patient and the family.  We discussed a brief life review of the  patient as well as functional and nutritional status. The patient and his wife have been married for 27 years. They have 1 son and 5 greatgrandchildren. [name] states that in July the patient "hit a wall and can't get over it." She reports he has been hallucinating for months. The patient states that he has "good and bad periods" with lots of "ups and downs." He reports the steroids only help "some" now. Therapeutic listening was provided as he reflected over the last several months and how hard it has been for him; however, he states that he now feels at peace with the current situation because now he knows (speaking about knowing when he would be moving to comfort measures). The patient has been living with CODP diagnosis since 1997 and has been wearing oxygen for 12 years - he was aware this is a progressive disease, describing it as a "rollar coaster."   We discussed patient's current illness and what it means in the larger context of patient's on-going co-morbidities. Natural disease trajectory and expectations at EOL were discussed. I attempted to elicit values and goals of care important to the patient.  The patient stated "I'm done" and expressed readiness  to continue full comfort measures.   Patient and family had previously expressed interest in home hospice support on discharge. During my visit the patient stated that he had changed his mind and did not want to go home with hospice - they preferred to stay in house. Discussed that in his situation, he may live days to months depending on oxygen use and disease progression. We discussed that staying in the hospital for possibly weeks to months is probably not the most ideal/comfortable situation for him. He wants to stay in house for now and depending on clinical course over the next several days or so will reevaluate.   Patient and wife asked about artificial feeding and hydration, with the wife stating "I don't want him to starve." Detailed  education and discussion was had around expectations at EOL and the natural decreased desire for eating/drinking. Medical recommendation was given to not pursue artifical hydration or feeding tube at end of life. Patient and Melissa expressed understanding and were agreeable they did not want IVF or feeding tube.  Visit also consisted of discussions dealing with the complex and emotionally intense issues of symptom management and palliative care in the setting of serious and life-threatening illness. Palliative care team will continue to support patient, patient's family, and medical team.  Discussed with patient/family the importance of continued conversation with each other and the medical providers regarding overall plan of care and treatment options, ensuring decisions are within the context of the patient's values and GOCs.    Questions and concerns were addressed. The patient/family was encouraged to call with questions and/or concerns. PMT card was provided.   Primary Decision Maker: PATIENT    SUMMARY OF RECOMMENDATIONS:  Continue full comfort measures  Continue DNR/DNI as previously documented  Patient states he no longer wants discharge with home hospice. He wants to stay in house - [facility] liaison notified   Recommend continued discussion around disposition - discussed that in his situation, he may live days to months depending on oxygen use and disease progression with overall prognosis of less than 6 months. Patient wants to stay for now and depending on clinical course over the next several days or so will reevaluate  Patient does not want IVF or feeding tube  Added orders for EOL symptom management and to reflect full comfort measures, as well as discontinued orders that were not focused on comfort Unrestricted visitation orders were placed per current [place] EOL visitation policy  Provide frequent assessments and administer PRN medications as clinically  necessary to ensure EOL comfort PMT will continue to follow holistically   Code Status/Advance Care Planning:  DNR  Palliative Prophylaxis:   Aspiration, Bowel Regimen, Delirium Protocol, Frequent Pain Assessment and Oral Care  Additional Recommendations (Limitations, Scope, Preferences):  Full Comfort Care  Psycho-social/Spiritual:  Created space and opportunity for patient and family to express thoughts and feelings regarding patient's current medical situation.   Emotional support and therapeutic listening provided.  Prognosis:   < 6 months  Discharge Planning: To Be Determined      Primary Diagnoses: Present on Admission: . End stage COPD ([place]) . Atrial fibrillation ([place]) . Diastolic CHF ([place]) . GI bleed . ABLA (acute blood loss anemia) . Stage 3a chronic kidney disease ([place])   I have reviewed the medical record, interviewed the patient and family, and examined the patient. The following aspects are pertinent.  Past Medical History:  Diagnosis Date  . Allergic rhinitis   . Arthritis   .  Cancer [facility])    left renal -solitary kidney  . COPD (chronic obstructive pulmonary disease) ([place])   . Erectile dysfunction   . Gout   . Hernia   . History of kidney cancer   . Hypercholesterolemia   . Hypertension   . RLS (restless legs syndrome)   . Sinus problem    Social History   Socioeconomic History  . Marital status: Married    Spouse name: Not on file  . Number of children: 1  . Years of education: Not on file  . Highest education level: Not on file  Occupational History  . Occupation: retired  Tobacco Use  . Smoking status: Former Smoker    Packs/day: 2.00    Years: 50.00    Pack years: 100.00    Types: Cigarettes    Quit date: 06/04/2011    Years since quitting: 9.2  . Smokeless tobacco: Never Used  Vaping Use  . Vaping Use: Never used  Substance and Sexual Activity  . Alcohol use: Yes    Alcohol/week: 2.0 - 3.0 standard drinks    Types: 2 -  3 Cans of beer per week    Comment: rarely drinks beer  . Drug use: No  . Sexual activity: Not on file  Other Topics Concern  . Not on file  Social History Narrative  . Not on file   Social Determinants of Health   Financial Resource Strain:   . Difficulty of Paying Living Expenses: Not on file  Food Insecurity:   . Worried About Charity fundraiser in the Last Year: Not on file  . Ran Out of Food in the Last Year: Not on file  Transportation Needs:   . Lack of Transportation (Medical): Not on file  . Lack of Transportation (Non-Medical): Not on file  Physical Activity:   . Days of Exercise per Week: Not on file  . Minutes of Exercise per Session: Not on file  Stress:   . Feeling of Stress : Not on file  Social Connections:   . Frequency of Communication with Friends and Family: Not on file  . Frequency of Social Gatherings with Friends and Family: Not on file  . Attends Religious Services: Not on file  . Active Member of Clubs or Organizations: Not on file  . Attends Archivist Meetings: Not on file  . Marital Status: Not on file   Family History  Problem Relation Age of Onset  . COPD Mother   . Hypertension Mother   . Osteoporosis Mother   . Prostate cancer Father   . Cancer Father        prostate   Scheduled Meds: . ALPRAZolam  0.5 mg Oral QHS  . azelastine  1 spray Each Nare BID  . Budeson-Glycopyrrol-Formoterol  2 puff Inhalation BID  . [START ON 19-Sep-2020] linaclotide  145 mcg Oral QAC breakfast  . [START ON 2020-09-19] roflumilast  500 mcg Oral q AM  . rOPINIRole  0.5 mg Oral QHS   Continuous Infusions: . morphine 5 mg/hr (09/14/2020 1530)   PRN Meds:.acetaminophen **OR** acetaminophen, albuterol, antiseptic oral rinse, bisacodyl, glycopyrrolate **OR** glycopyrrolate **OR** glycopyrrolate, haloperidol **OR** haloperidol **OR** haloperidol lactate, ipratropium-albuterol, LORazepam **OR** LORazepam **OR** LORazepam, morphine injection, ondansetron  **OR** ondansetron (ZOFRAN) IV, polyvinyl alcohol, senna, sodium chloride HYPERTONIC, temazepam, traMADol Medications Prior to Admission:  Prior to Admission medications   Medication Sig Start Date End Date Taking? Authorizing Provider  albuterol (PROAIR HFA) 108 (90 Base) MCG/ACT inhaler Inhale 2  puffs into the lungs every 4 (four) hours as needed for wheezing or shortness of breath. 09/09/20  Yes Icard, Octavio Graves, DO  allopurinol (ZYLOPRIM) 300 MG tablet Take 300 mg by mouth daily.   Yes [provider]  ALPRAZolam (XANAX) 0.5 MG tablet Take 1 tablet (0.5 mg total) by mouth 2 (two) times daily. Patient taking differently: Take 0.5 mg by mouth at bedtime.  08/24/14  Yes Clance, Armando Reichert, MD  amLODipine (NORVASC) 10 MG tablet Take 10 mg by mouth daily.    Yes [provider]  atorvastatin (LIPITOR) 20 MG tablet Take 20 mg by mouth daily.   Yes [provider]  azelastine (ASTELIN) 0.1 % nasal spray Place 1 spray into both nostrils 2 (two) times daily.  03/20/15  Yes [provider]  azithromycin (ZITHROMAX) 250 MG tablet Take Monday, Wednesday, Friday only with food. Patient taking differently: Take 250 mg by mouth every Monday, Wednesday, and Friday.  11/29/19  Yes Lauraine Rinne, NP  Budeson-Glycopyrrol-Formoterol (BREZTRI AEROSPHERE) 160-9-4.8 MCG/ACT AERO Inhale 2 puffs into the lungs in the morning and at bedtime. Patient taking differently: Inhale 2 puffs into the lungs 2 (two) times daily.  04/15/20  Yes Lauraine Rinne, NP  Cholecalciferol (VITAMIN D-3) 25 MCG (1000 UT) CAPS Take 1,000 Units by mouth at bedtime.   Yes [provider]  ELIQUIS 5 MG TABS tablet TAKE 1 TABLET TWO TIMES DAILY Patient taking differently: Take 5 mg by mouth 2 (two) times daily.  03/05/20  Yes Adrian Prows, MD  fish oil-omega-3 fatty acids 1000 MG capsule Take 1 g by mouth 3 (three) times daily.    Yes [provider]  flecainide (TAMBOCOR) 100 MG tablet TAKE 1  TABLET  TWO TIMES DAILY Patient taking differently: Take 100 mg by mouth 2 (two) times daily.  06/12/20  Yes Adrian Prows, MD  furosemide (LASIX) 40 MG tablet TAKE 1 TABLET TWICE DAILY IN THE MORNING AND AT 3 PM AS NEEDED FOR FLUID Patient taking differently: Take 40 mg by mouth 2 (two) times daily.  04/11/20  Yes Adrian Prows, MD  ipratropium-albuterol (DUONEB) 0.5-2.5 (3) MG/3ML SOLN Use every 6-8 hours as needed for wheezing and SOB. Patient taking differently: Take 3 mLs by nebulization every 6 (six) hours as needed (for shortness of breath or wheezing).  06/06/20  Yes Lauraine Rinne, NP  Linaclotide (LINZESS) 145 MCG CAPS capsule Take 145 mcg by mouth daily before breakfast.    Yes [provider]  losartan-hydrochlorothiazide (HYZAAR) 100-25 MG tablet Take 1 tablet by mouth daily. 08/08/20  Yes [provider]  Magnesium 500 MG TABS Take 500 mg by mouth at bedtime.   Yes [provider]  metoprolol succinate (TOPROL-XL) 50 MG 24 hr tablet Take 50 mg by mouth daily. Take with or immediately following a meal.   Yes [provider]  montelukast (SINGULAIR) 10 MG tablet TAKE 1 TABLET AT BEDTIME Patient taking differently: Take 10 mg by mouth at bedtime.  04/11/20  Yes Lauraine Rinne, NP  Multiple Vitamins-Minerals (ONE-A-DAY MENS 50+) TABS Take 1 tablet by mouth daily with breakfast.   Yes [provider]  nitroGLYCERIN (NITROSTAT) 0.4 MG SL tablet Place 0.4 mg under the tongue every 5 (five) minutes as needed for chest pain.   Yes [provider]  OXYGEN Inhale 6-8 L/min into the lungs continuous. Inhale 6-7 L/min of oxygen continuously and increase to 8 L/min, if exerted   Yes [provider]  potassium chloride (KLOR-CON) 10 MEQ tablet TAKE 1 TABLET TWO TIMES DAILY AS NEEDED WITH FUROSEMIDE Patient taking differently: Take 10 mEq by mouth 2 (two) times daily.  06/12/20  Yes Adrian Prows, MD  rOPINIRole (REQUIP) 0.5 MG tablet Take 1 tablet (0.5 mg total) by  mouth daily. Patient taking differently: Take 0.5 mg by mouth at bedtime.  04/05/13  Yes Clance, Armando Reichert, MD  sodium chloride HYPERTONIC 3 % nebulizer solution Take by nebulization as needed for other. Patient taking differently: Take 4 mLs by nebulization daily as needed (to loosen phlegm).  06/06/20  Yes Lauraine Rinne, NP  temazepam (RESTORIL) 15 MG capsule Take 15 mg by mouth at bedtime as needed for sleep.    Yes [provider]  traMADol (ULTRAM) 50 MG tablet TAKE 1 TABLET BY MOUTH THREE TIMES DAILY AS NEEDED FOR MODERATE PAIN Patient taking differently: Take 50 mg by mouth See admin instructions. Take 50 mg by mouth at bedtime and an additional 50 mg up to two times a day as needed for anxiety 01/26/20  Yes Adrian Prows, MD  vitamin C (ASCORBIC ACID) 500 MG tablet Take 500 mg by mouth daily.   Yes [provider]  DALIRESP 500 MCG TABS tablet TAKE 1 TABLET EVERY DAY 10/03/2020   Martyn Ehrich, NP   No Known Allergies Review of Systems  Constitutional: Positive for activity change and fatigue.  Respiratory: Positive for shortness of breath.   Neurological: Positive for weakness.  All other systems reviewed and are negative.   Physical Exam Vitals and nursing note reviewed.  Constitutional:      General: He is not in acute distress. Pulmonary:     Effort: No respiratory distress.  Abdominal:     General: There is distension.  Skin:    General: Skin is warm and dry.  Neurological:     Mental Status: He is alert and oriented to person, place, and time.     Motor: Weakness present.  Psychiatric:        Attention and Perception: Attention normal.        Behavior: Behavior is cooperative.        Cognition and Memory: Cognition and memory normal.     Vital Signs: BP 136/63 (BP Location: Right Arm)   Pulse 97   Temp 99.1 F (37.3 C) (Oral)   Resp (!) 28   SpO2 (!) 89%  Pain Scale: 0-10   Pain Score: 0-No pain   SpO2: SpO2: (!) 89 % O2 Device:SpO2: (!) 89  % O2 Flow Rate: .O2 Flow Rate (L/min): 10 L/min  IO: Intake/output summary: No intake or output data in the 24 hours ending 09/09/2020 1648  LBM:   Baseline Weight:   Most recent weight:       Palliative Assessment/Data: PPS 50%     Time In: 1650 Time Out: 1805 Time Total: 75 minutes  Greater than 50%  of this time was spent counseling and coordinating care related to the above assessment and plan.  Signed by: Lin Landsman, NP   Please contact Palliative Medicine Team phone at [phone] for questions and concerns.  For individual provider: See Shea Evans

## 2020-09-16 NOTE — H&P (Signed)
History and Physical    Harold Hall YQI:[id number] DOB: 1946-08-22 DOA: 10/08/2020  PCP: Harold Hall, L.Harold Sa, MD Consultants:  Harold Hall - pulmonology; Harold Hall - cadiology Patient coming from:  Home - lives with wife; NOK: Wife, Harold Hall, [place]  Chief Complaint: SOB  HPI: Harold Hall is a 74 y.o. male with medical history significant of end-stage COPD; chronic diastolic CHF; RLS; HTN; HLD; and RCC presenting with SOB.  His wife reports that he has been complaining of L-sided abdominal discomfort.  He loses his breath with minimal exertion, can't walk more than 20 feet but is SOB just with standing.  He just finished his 3rd or 4th round of prednisone since summer.  They have said they have done everything they can medically and that it is time to work on keeping him comfortable.  Palliative care consult pending, scheduled for Wednesday.  After discussion of options in the ER, the patient and his wife are ready to proceed with full comfort measures and would like to plan to go home with hospice if he doesn't have an in-hospital demise.   ED Course: End-stage COPD, on 8-12L home O2.  Recently seen by pulmonology and referred to palliative care, made DNR.  SOB for a few days.  pCO2 91, pH 7.3 - so maybe baseline.  Doing much better on BIPAP.  Also with GI bleed, likely from steroid use.  Hgb 7, agrees to blood.  Dr. Paulita Hall says not a candidate for EGD, needs medical management.  Review of Systems: As per HPI; otherwise review of systems reviewed and negative.   Ambulatory Status:  Ambulates with a walker  COVID Vaccine Status:  Complete  Past Medical History:  Diagnosis Date  . Allergic rhinitis   . Arthritis   . Cancer [facility])    left renal -solitary kidney  . COPD (chronic obstructive pulmonary disease) ([place])   . Erectile dysfunction   . Gout   . Hernia   . History of kidney cancer   . Hypercholesterolemia   . Hypertension   . RLS (restless legs syndrome)   . Sinus problem      Past Surgical History:  Procedure Laterality Date  . CARDIAC CATHETERIZATION    . HERNIA REPAIR  06/04/11  . left kidney removed  1994    Social History   Socioeconomic History  . Marital status: Married    Spouse name: Not on file  . Number of children: 1  . Years of education: Not on file  . Highest education level: Not on file  Occupational History  . Occupation: retired  Tobacco Use  . Smoking status: Former Smoker    Packs/day: 2.00    Years: 50.00    Pack years: 100.00    Types: Cigarettes    Quit date: 06/04/2011    Years since quitting: 9.2  . Smokeless tobacco: Never Used  Vaping Use  . Vaping Use: Never used  Substance and Sexual Activity  . Alcohol use: Yes    Alcohol/week: 2.0 - 3.0 standard drinks    Types: 2 - 3 Cans of beer per week    Comment: rarely drinks beer  . Drug use: No  . Sexual activity: Not on file  Other Topics Concern  . Not on file  Social History Narrative  . Not on file   Social Determinants of Health   Financial Resource Strain:   . Difficulty of Paying Living Expenses: Not on file  Food Insecurity:   . Worried About Estate manager/land agent  of Food in the Last Year: Not on file  . Ran Out of Food in the Last Year: Not on file  Transportation Needs:   . Lack of Transportation (Medical): Not on file  . Lack of Transportation (Non-Medical): Not on file  Physical Activity:   . Days of Exercise per Week: Not on file  . Minutes of Exercise per Session: Not on file  Stress:   . Feeling of Stress : Not on file  Social Connections:   . Frequency of Communication with Friends and Family: Not on file  . Frequency of Social Gatherings with Friends and Family: Not on file  . Attends Religious Services: Not on file  . Active Member of Clubs or Organizations: Not on file  . Attends Archivist Meetings: Not on file  . Marital Status: Not on file  Intimate Partner Violence:   . Fear of Current or Ex-Partner: Not on file  . Emotionally  Abused: Not on file  . Physically Abused: Not on file  . Sexually Abused: Not on file    No Known Allergies  Family History  Problem Relation Age of Onset  . COPD Mother   . Hypertension Mother   . Osteoporosis Mother   . Prostate cancer Father   . Cancer Father        prostate    Prior to Admission medications   Medication Sig Start Date End Date Taking? Authorizing Provider  albuterol (PROAIR HFA) 108 (90 Base) MCG/ACT inhaler Inhale 2 puffs into the lungs every 4 (four) hours as needed for wheezing or shortness of breath. 09/09/20  Yes Harold Hall, Harold Graves, DO  allopurinol (ZYLOPRIM) 300 MG tablet Take 300 mg by mouth daily.   Yes [provider]  ALPRAZolam (XANAX) 0.5 MG tablet Take 1 tablet (0.5 mg total) by mouth 2 (two) times daily. Patient taking differently: Take 0.5 mg by mouth at bedtime.  08/24/14  Yes Harold Hall, Harold Reichert, MD  amLODipine (NORVASC) 10 MG tablet Take 10 mg by mouth daily.    Yes [provider]  atorvastatin (LIPITOR) 20 MG tablet Take 20 mg by mouth daily.   Yes [provider]  azelastine (ASTELIN) 0.1 % nasal spray Place 1 spray into both nostrils 2 (two) times daily.  03/20/15  Yes [provider]  azithromycin (ZITHROMAX) 250 MG tablet Take Monday, Wednesday, Friday only with food. Patient taking differently: Take 250 mg by mouth every Monday, Wednesday, and Friday.  11/29/19  Yes Harold Rinne, NP  Budeson-Glycopyrrol-Formoterol (BREZTRI AEROSPHERE) 160-9-4.8 MCG/ACT AERO Inhale 2 puffs into the lungs in the morning and at bedtime. Patient taking differently: Inhale 2 puffs into the lungs 2 (two) times daily.  04/15/20  Yes Harold Rinne, NP  Cholecalciferol (VITAMIN D-3) 25 MCG (1000 UT) CAPS Take 1,000 Units by mouth at bedtime.   Yes [provider]  DALIRESP 500 MCG TABS tablet TAKE 1 TABLET EVERY DAY Patient taking differently: Take 500 mcg by mouth in the morning.  10/20/19  Yes Harold Ehrich, NP  ELIQUIS 5  MG TABS tablet TAKE 1 TABLET TWO TIMES DAILY Patient taking differently: Take 5 mg by mouth 2 (two) times daily.  03/05/20  Yes Harold Prows, MD  fish oil-omega-3 fatty acids 1000 MG capsule Take 1 g by mouth 3 (three) times daily.    Yes [provider]  flecainide (TAMBOCOR) 100 MG tablet TAKE 1  TABLET TWO TIMES DAILY Patient taking differently: Take 100 mg by mouth  2 (two) times daily.  06/12/20  Yes Harold Prows, MD  furosemide (LASIX) 40 MG tablet TAKE 1 TABLET TWICE DAILY IN THE MORNING AND AT 3 PM AS NEEDED FOR FLUID Patient taking differently: Take 40 mg by mouth 2 (two) times daily.  04/11/20  Yes Harold Prows, MD  ipratropium-albuterol (DUONEB) 0.5-2.5 (3) MG/3ML SOLN Use every 6-8 hours as needed for wheezing and SOB. Patient taking differently: Take 3 mLs by nebulization every 6 (six) hours as needed (for shortness of breath or wheezing).  06/06/20  Yes Harold Rinne, NP  Linaclotide (LINZESS) 145 MCG CAPS capsule Take 145 mcg by mouth daily before breakfast.    Yes [provider]  losartan-hydrochlorothiazide (HYZAAR) 100-25 MG tablet Take 1 tablet by mouth daily. 08/08/20  Yes [provider]  Magnesium 500 MG TABS Take 500 mg by mouth at bedtime.   Yes [provider]  metoprolol succinate (TOPROL-XL) 50 MG 24 hr tablet Take 50 mg by mouth daily. Take with or immediately following a meal.   Yes [provider]  montelukast (SINGULAIR) 10 MG tablet TAKE 1 TABLET AT BEDTIME Patient taking differently: Take 10 mg by mouth at bedtime.  04/11/20  Yes Harold Rinne, NP  Multiple Vitamins-Minerals (ONE-A-DAY MENS 50+) TABS Take 1 tablet by mouth daily with breakfast.   Yes [provider]  nitroGLYCERIN (NITROSTAT) 0.4 MG SL tablet Place 0.4 mg under the tongue every 5 (five) minutes as needed for chest pain.   Yes [provider]  OXYGEN Inhale 6-8 L/min into the lungs continuous. Inhale 6-7 L/min of oxygen continuously and increase to 8  L/min, if exerted   Yes [provider]  potassium chloride (KLOR-CON) 10 MEQ tablet TAKE 1 TABLET TWO TIMES DAILY AS NEEDED WITH FUROSEMIDE Patient taking differently: Take 10 mEq by mouth 2 (two) times daily.  06/12/20  Yes Harold Prows, MD  rOPINIRole (REQUIP) 0.5 MG tablet Take 1 tablet (0.5 mg total) by mouth daily. Patient taking differently: Take 0.5 mg by mouth at bedtime.  04/05/13  Yes Harold Hall, Harold Reichert, MD  sodium chloride HYPERTONIC 3 % nebulizer solution Take by nebulization as needed for other. Patient taking differently: Take 4 mLs by nebulization daily as needed (to loosen phlegm).  06/06/20  Yes Harold Rinne, NP  temazepam (RESTORIL) 15 MG capsule Take 15 mg by mouth at bedtime as needed for sleep.    Yes [provider]  traMADol (ULTRAM) 50 MG tablet TAKE 1 TABLET BY MOUTH THREE TIMES DAILY AS NEEDED FOR MODERATE PAIN Patient taking differently: Take 50 mg by mouth See admin instructions. Take 50 mg by mouth at bedtime and an additional 50 mg up to two times a day as needed for anxiety 01/26/20  Yes Harold Prows, MD  vitamin C (ASCORBIC ACID) 500 MG tablet Take 500 mg by mouth daily.   Yes [provider]  nystatin (MYCOSTATIN) 100000 UNIT/ML suspension Take 5 mLs (500,000 Units total) by mouth 4 (four) times daily. Patient not taking: Reported on 09/11/2020 03/27/20   Harold Rinne, NP  predniSONE (DELTASONE) 10 MG tablet Take 4 tablets for 4 days, 3 tablets for 4 days, 2 tablets for 4 days, 1 tablet for 4 days then stop Patient not taking: Reported on 09/20/2020 08/27/20   Garner Nash, DO  Respiratory Therapy Supplies (FLUTTER) DEVI Use as directed Patient not taking: Reported on 09/25/2020 05/10/18   Harold Rinne, NP  sodium chloride HYPERTONIC 3 % nebulizer  solution  06/06/20   [provider]    Physical Exam: Vitals:   09/28/2020 1128 09/20/2020 1130 09/12/2020 1144 09/15/2020 1145  BP: 140/69 (!) 153/54 (!) 121/50 (!) 121/50  Pulse: (!) 104 (!) 106  (!) 104 (!) 105  Resp: (!) 24 (!) 29 (!) 31 (!) 28  Temp: 98.4 F (36.9 C)  98 F (36.7 C)   TempSrc:   Oral   SpO2:  98%  96%     . General:  Initially on BIPAP and miserable with mask; with transition to comfort care, tripod sitting in recliner with [place] O2 and sats in 60s with respiratory distress - which patient and wife state is baseline and how he has slept for the last 16 years . Eyes:  PERRL, EOMI, normal lids, iris . ENT:  grossly normal hearing, lips & tongue, mmm . Neck:  no LAD, masses or thyromegaly . Cardiovascular:  RR with tachycardia, no m/r/g. No LE edema.  Marland Kitchen Respiratory:   Poor air movement, diffuse tight wheezing.  Significantly increased respiratory effort. . Abdomen:  soft, NT, ND, NABS . Back:   normal alignment, no CVAT . Skin:  no rash or induration seen on limited exam . Musculoskeletal:  grossly normal tone BUE/BLE, good ROM, no bony abnormality . Psychiatric:  Anxious/blunted mood and affect, speech fluent and appropriate but labored due to breathing, AOx3 . Neurologic:  CN 2-12 grossly intact, moves all extremities in coordinated fashion    Radiological Exams on Admission: Independently reviewed - see discussion in A/P where applicable  DG Chest Port 1 View  Result Date: 10/08/2020 CLINICAL DATA:  Shortness of breath. EXAM: PORTABLE CHEST 1 VIEW COMPARISON:  08/04/2020 chest radiograph and prior. FINDINGS: Emphysematous changes. Diffuse interstitial prominence. Patchy bibasilar opacities, unchanged. No pneumothorax or pleural effusion. Cardiomediastinal silhouette is similar to prior exam. No acute osseous abnormality. IMPRESSION: Emphysema. Stable appearance of bibasilar patchy opacities however cannot exclude underlying infection. Electronically Signed   By: Primitivo Gauze M.D.   On: 10/08/2020 09:20    EKG: Independently reviewed.  Sinus tachycardia with rate 112; prolonged QTc 613; nonspecific ST changes with NSCSLT   Labs on Admission: I have  personally reviewed the available labs and imaging studies at the time of the admission.  Pertinent labs:   CO2 41 Glucose 204 BUN 21/Creatinine 1.50/GFR 49; 73/2.04/31 on 9/29 BNP 175.8 HS troponin 13, 18 Lactate 2.1, 1.8 WBC 22.1 Hgb 7.1; 10.7 on 9/28 and 9.8 on 9/29 ABG: 7.334/91.2/80/48.5 Heme positive   Assessment/Plan Principal Problem:   Admission for end of life care Active Problems:   Atrial fibrillation (HCC)   Diastolic CHF (HCC)   End stage COPD (HCC)   GI bleed   ABLA (acute blood loss anemia)   Stage 3a chronic kidney disease ([place])   -This patient has had severe COPD requiring hospitalizations and high oxygen requirement at home -He has been repeatedly treated with steroids -At his most recent appointment with Dr. Valeta Hall on 11/1, they discussed palliative care and the patient was made DNR -He presented today with acute on chronic respiratory failure and was also found to have a GI bleed with ABLA -He was initially refusing care and when his wife arrived he agreed to BIPAP and blood transfusion -GI was consulted by telephone but the patient is not a candidate for intervention -Blood transfusion was initiated -With this as the background, I evaluated the patient and spoke frankly with him and his wife -I offered 2 options - ongoing  aggressive care with Protonix and blood transfusions and ongoing BIPAP with the hope of marginal improvement for somewhat extended longevity OR transition to comfort measures -After long discussion in the ER, family has decided to proceed with comfort care only -Initially, the thought was for overnight observation and then d/c to home with home hospice; I spoke with Domenic Moras regarding this plan -However, shortly after our discussion the patient and his wife chose to remove the BIPAP and he is currently with O2 sats in the 60s -At this time, will plan to observe but will also start morphine infusion for comfort and he may suffer an  in-hospital demise due to his utter lack of reserve -GI bleeding will not be further addressed as per patient/family wishes and transfusion will not be completed -Will observe at [facility] for comfort care and palliative care consult -Comfort care order set utilized    DVT prophylaxis: None - comfort measures Code Status: DNR - confirmed with family Family Communication: Wife was present throughout evaluation Disposition Plan: Anticipate in-hospital death Consults called: Palliative care Admission status: It is my clinical opinion that referral for OBSERVATION is reasonable and necessary in this patient based on the above information provided. The aforementioned taken together are felt to place the patient at high risk for further clinical deterioration. However it is anticipated that the patient may be medically stable for discharge from the hospital within 24 to 48 hours.   Karmen Bongo MD [facility]   How to contact the [facility] Attending or Consulting provider [place] or covering provider during after hours [place], for this patient?  1. Check the care team in [facility] and look for a) attending/consulting TRH provider listed and b) the [facility] team listed 2. Log into [email or web address] and use [place]'s universal password to access. If you do not have the password, please contact the hospital operator. 3. Locate the [facility] provider you are looking for under [facility] and page to a number that you can be directly reached. 4. If you still have difficulty reaching the provider, please page the [facility] (Director on Call) for the Hospitalists listed on amion for assistance.   10/04/2020, 1:15 PM

## 2020-09-16 NOTE — Progress Notes (Signed)
Patient and family state they wish to stay in the hospital with comfort care and not go home on hospice.

## 2020-09-16 NOTE — Progress Notes (Signed)
CSW spoke with Harold Hall - she is aware of this patient and will make contact with his family to initiate services.  Madilyn Fireman, MSW, LCSW-A Transitions of Care  Clinical Social Worker  [facility] Emergency Departments  Medical ICU [phone]

## 2020-09-16 NOTE — Progress Notes (Signed)
Patient is refusing Bipap.  MD notified.

## 2020-09-16 NOTE — Progress Notes (Signed)
Patient had previously refused to start the CAT.  Due to desat on 8L CAT, RT added a [place] at 6L. Sats improved from 83% TO 94%.  RN notified.

## 2020-09-16 NOTE — ED Triage Notes (Signed)
Pt coming from home. Complaint of shortness of breath x3 weeks. Pt has been seen by dr. Has not improved. Pt 8L [place] @ home. SpO2 84% on EMS arrival, 99% 15 L NRB. VSS.

## 2020-09-17 DIAGNOSIS — Z515 Encounter for palliative care: Secondary | ICD-10-CM | POA: Diagnosis not present

## 2020-09-19 LAB — TYPE AND SCREEN
ABO/RH(D): O POS
Antibody Screen: NEGATIVE
Unit division: 0
Unit division: 0

## 2020-09-19 LAB — BPAM RBC
Blood Product Expiration Date: 202111152359
Blood Product Expiration Date: 202111152359
ISSUE DATE / TIME: 202111081117
Unit Type and Rh: 5100
Unit Type and Rh: 5100

## 2020-09-21 LAB — CULTURE, BLOOD (ROUTINE X 2)
Culture: NO GROWTH
Culture: NO GROWTH
Special Requests: ADEQUATE

## 2020-09-24 ENCOUNTER — Ambulatory Visit: Payer: Medicare Other | Admitting: Cardiology

## 2020-10-09 NOTE — Progress Notes (Signed)
Breathing noted to be more shallow.  Family in attendance.

## 2020-10-09 NOTE — Progress Notes (Signed)
Family has vacated and patient prepared for morgue.  Unable to remove wedding ring, which family is aware of.

## 2020-10-09 NOTE — Death Summary Note (Signed)
Death Summary  [name]:[id number] DOB: Jul 09, 1946 DOA: 09/27/2020  PCP: Alroy Dust, L.[name], MD  Admit date: 2020-09-27 Date of Death: 09/28/2020 Time of Death: 11:45 PM Notification: Alroy Dust, L.[name], MD notified of death of September 28, 2020   History of present illness:  Harold Hall is a 74 y.o. male with a history of end-stage COPD chronic diastolic heart failure RLS HTN HLD RCC presented with complaint of cough shortness of breath difficulty breathing 2020/09/27 has been unable to move more than 20 feet and had just finished 3-4 rounds of prednisone in the summer He was brought to the hospital after discussion with pulmonology and was referred to palliative care PCO2 was in the 90s pH was 7.3 he also was found to be probably having a GI bleed with hemoglobin dropping to 7 range He had been made a DNR by Dr. Valeta Harms on 11/10/2019-patient was found to be probably actively dying and after extensive discussion positional patient was admitted He was placed on morphine drip and comfort trajectory and ultimately passed away peacefully on Sep 28, 2020 at the above time  Exam Large white male pupils pinpoint no spontaneous respiration no discernible heart rate Obese abdomen no lower extremity edema  Final Diagnoses:  1.   Hypoxic respiratory failure secondary to end-stage COPD GI bleed Diastolic heart failure   The results of significant diagnostics from this hospitalization (including imaging, microbiology, ancillary and laboratory) are listed below for reference.    Significant Diagnostic Studies: DG Chest Port 1 View  Result Date: 27-Sep-2020 CLINICAL DATA:  Shortness of breath. EXAM: PORTABLE CHEST 1 VIEW COMPARISON:  08/04/2020 chest radiograph and prior. FINDINGS: Emphysematous changes. Diffuse interstitial prominence. Patchy bibasilar opacities, unchanged. No pneumothorax or pleural effusion. Cardiomediastinal silhouette is similar to prior exam. No acute osseous abnormality.  IMPRESSION: Emphysema. Stable appearance of bibasilar patchy opacities however cannot exclude underlying infection. Electronically Signed   By: Primitivo Gauze M.D.   On: 09-27-20 09:20    Microbiology: Recent Results (from the past 240 hour(s))  Respiratory Panel by RT PCR (Flu A&B, Covid) - Nasopharyngeal Swab     Status: None   Collection Time: 09-27-20  8:29 AM   Specimen: Nasopharyngeal Swab  Result Value Ref Range Status   SARS Coronavirus 2 by RT PCR NEGATIVE NEGATIVE Final    Comment: (NOTE) SARS-CoV-2 target nucleic acids are NOT DETECTED.  The SARS-CoV-2 RNA is generally detectable in upper respiratoy specimens during the acute phase of infection. The lowest concentration of SARS-CoV-2 viral copies this assay can detect is 131 copies/mL. A negative result does not preclude SARS-Cov-2 infection and should not be used as the sole basis for treatment or other patient management decisions. A negative result may occur with  improper specimen collection/handling, submission of specimen other than nasopharyngeal swab, presence of viral mutation(s) within the areas targeted by this assay, and inadequate number of viral copies (<131 copies/mL). A negative result must be combined with clinical observations, patient history, and epidemiological information. The expected result is Negative.  Fact Sheet for Patients:  PinkCheek.be  Fact Sheet for Healthcare Providers:  GravelBags.it  This test is no t yet approved or cleared by the [place] FDA and  has been authorized for detection and/or diagnosis of SARS-CoV-2 by FDA under an Emergency Use Authorization (EUA). This EUA will remain  in effect (meaning this test can be used) for the duration of the COVID-19 declaration under Section 564(b)(1) of the Act, 21 U.S.C. section 360bbb-3(b)(1), unless the authorization is terminated or revoked sooner.  Influenza A  by PCR NEGATIVE NEGATIVE Final   Influenza B by PCR NEGATIVE NEGATIVE Final    Comment: (NOTE) The Xpert Xpress SARS-CoV-2/FLU/RSV assay is intended as an aid in  the diagnosis of influenza from Nasopharyngeal swab specimens and  should not be used as a sole basis for treatment. Nasal washings and  aspirates are unacceptable for Xpert Xpress SARS-CoV-2/FLU/RSV  testing.  Fact Sheet for Patients: PinkCheek.be  Fact Sheet for Healthcare Providers: GravelBags.it  This test is not yet approved or cleared by the [place] FDA and  has been authorized for detection and/or diagnosis of SARS-CoV-2 by  FDA under an Emergency Use Authorization (EUA). This EUA will remain  in effect (meaning this test can be used) for the duration of the  Covid-19 declaration under Section 564(b)(1) of the Act, 21  U.S.C. section 360bbb-3(b)(1), unless the authorization is  terminated or revoked. Performed at [facility], [place] [address], [place], [place] [address]   Culture, blood (routine x 2)     Status: None (Preliminary result)   Collection Time: 09/22/2020  8:38 AM   Specimen: BLOOD  Result Value Ref Range Status   Specimen Description BLOOD SITE NOT SPECIFIED  Final   Special Requests   Final    BOTTLES DRAWN AEROBIC AND ANAEROBIC Blood Culture results may not be optimal due to an inadequate volume of blood received in culture bottles   Culture   Final    NO GROWTH < 24 HOURS Performed at [facility], [place] [address] [place], [place] [address]    Report Status PENDING  Incomplete  Culture, blood (routine x 2)     Status: None (Preliminary result)   Collection Time: 09/15/2020 10:20 AM   Specimen: BLOOD RIGHT ARM  Result Value Ref Range Status   Specimen Description BLOOD RIGHT ARM  Final   Special Requests   Final    BOTTLES DRAWN AEROBIC AND ANAEROBIC Blood Culture adequate volume   Culture   Final    NO GROWTH < 24  HOURS Performed at [facility], [place] [address] [place], [place] [address]    Report Status PENDING  Incomplete     Labs: Basic Metabolic Panel: Recent Labs  Lab 09/25/2020 0835 09/09/2020 0911  NA 142 141  K 3.6 3.5  CL 91*  --   CO2 41*  --   GLUCOSE 204*  --   BUN 21  --   CREATININE 1.50*  --   CALCIUM 9.4  --    Liver Function Tests: No results for input(s): AST, ALT, ALKPHOS, BILITOT, PROT, ALBUMIN in the last 168 hours. No results for input(s): LIPASE, AMYLASE in the last 168 hours. No results for input(s): AMMONIA in the last 168 hours. CBC: Recent Labs  Lab 10/04/2020 0835 09/09/2020 0911  WBC 22.1*  --   NEUTROABS 19.1*  --   HGB 7.1* 7.8*  HCT 27.4* 23.0*  MCV 83.5  --   PLT 292  --    Cardiac Enzymes: No results for input(s): CKTOTAL, CKMB, CKMBINDEX, TROPONINI in the last 168 hours. D-Dimer No results for input(s): DDIMER in the last 72 hours. BNP: Invalid input(s): POCBNP CBG: No results for input(s): GLUCAP in the last 168 hours. Anemia work up No results for input(s): VITAMINB12, FOLATE, FERRITIN, TIBC, IRON, RETICCTPCT in the last 72 hours. Urinalysis No results found for: COLORURINE, APPEARANCEUR, LABSPEC, PHURINE, GLUCOSEU, HGBUR, BILIRUBINUR, KETONESUR, PROTEINUR, UROBILINOGEN, NITRITE, LEUKOCYTESUR Sepsis Labs Invalid input(s): PROCALCITONIN,  WBC,  LACTICIDVEN     SIGNED:  Nita Sells, MD  [facility] Sep 26, 2020, 1:02 PM Pager   If 7PM-7AM, please contact night-coverage [email or web address] Password [email or web address]

## 2020-10-09 NOTE — Progress Notes (Signed)
Called to patient's room by family.  Respirations have ceased.  No palpable or audible pulse present.  Pronounced by myself and by Hulan Amato, RN.  MD notified.

## 2020-10-09 NOTE — Progress Notes (Signed)
Palliative Medicine RN Note: Rec'd a call from RN Marge; pt has passed away & family is asking for a list of crematories. This is not, unfortunately, anything that [facility] has. I have called the unit; LCSW Silverio Lay is at the desk; they will see if he has that resource for the family. NP [employer] is Science writer.   Marjie Skiff Sherria Riemann, RN, BSN, [facility] Palliative Medicine Team 09/24/20 12:12 PM Office [phone]

## 2020-10-09 NOTE — Plan of Care (Addendum)
  Problem: Clinical Measurements: Goal: Respiratory complications will improve Outcome: Not Applicable   Problem: Coping: Goal: Level of anxiety will decrease Outcome: Not Applicable   Problem: Elimination: Goal: Will not experience complications related to bowel motility Outcome: Not Applicable Goal: Will not experience complications related to urinary retention Outcome: Not Applicable   Problem: Pain Managment: Goal: General experience of comfort will improve Outcome: Not Applicable   Problem: Safety: Goal: Ability to remain free from injury will improve Outcome: Not Applicable   Problem: Skin Integrity: Goal: Risk for impaired skin integrity will decrease Outcome: Not Applicable   Problem: Clinical Measurements: Goal: Respiratory complications will improve Outcome: Not Applicable   Problem: Coping: Goal: Level of anxiety will decrease Outcome: Not Applicable   Problem: Elimination: Goal: Will not experience complications related to bowel motility Outcome: Not Applicable Goal: Will not experience complications related to urinary retention Outcome: Not Applicable   Problem: Pain Managment: Goal: General experience of comfort will improve Outcome: Not Applicable   Problem: Safety: Goal: Ability to remain free from injury will improve Outcome: Not Applicable   Problem: Skin Integrity: Goal: Risk for impaired skin integrity will decrease Outcome: Not Applicable

## 2020-10-09 DEATH — deceased

## 2022-05-08 IMAGING — DX DG CHEST 2V
2 series · 2 of 2 positions shown · non-contrast
Comparison: 06/13/2019

CLINICAL DATA: COPD, dyspnea on exertion, hypoxemia

EXAM:
CHEST - 2 VIEW

[chest pa]
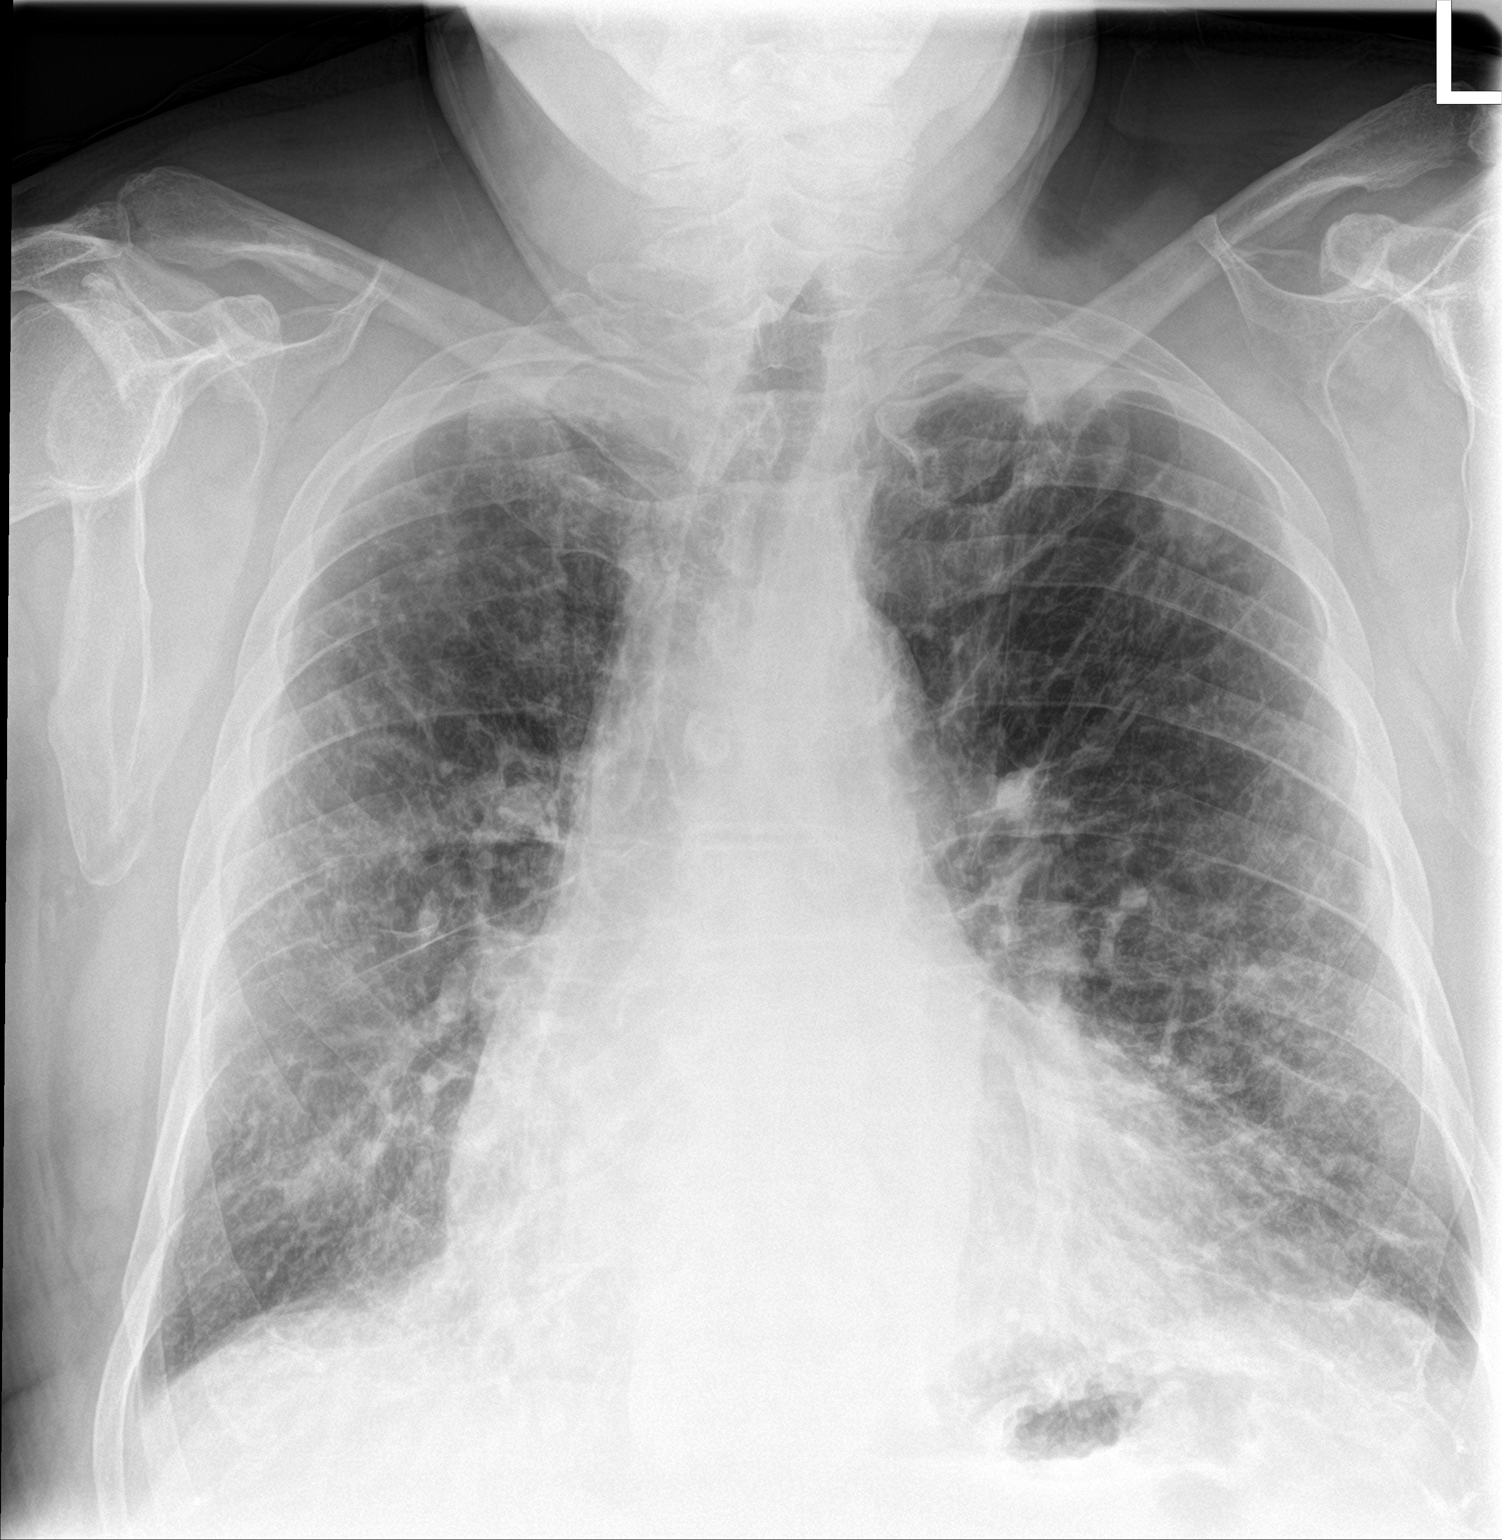

[chest lat]
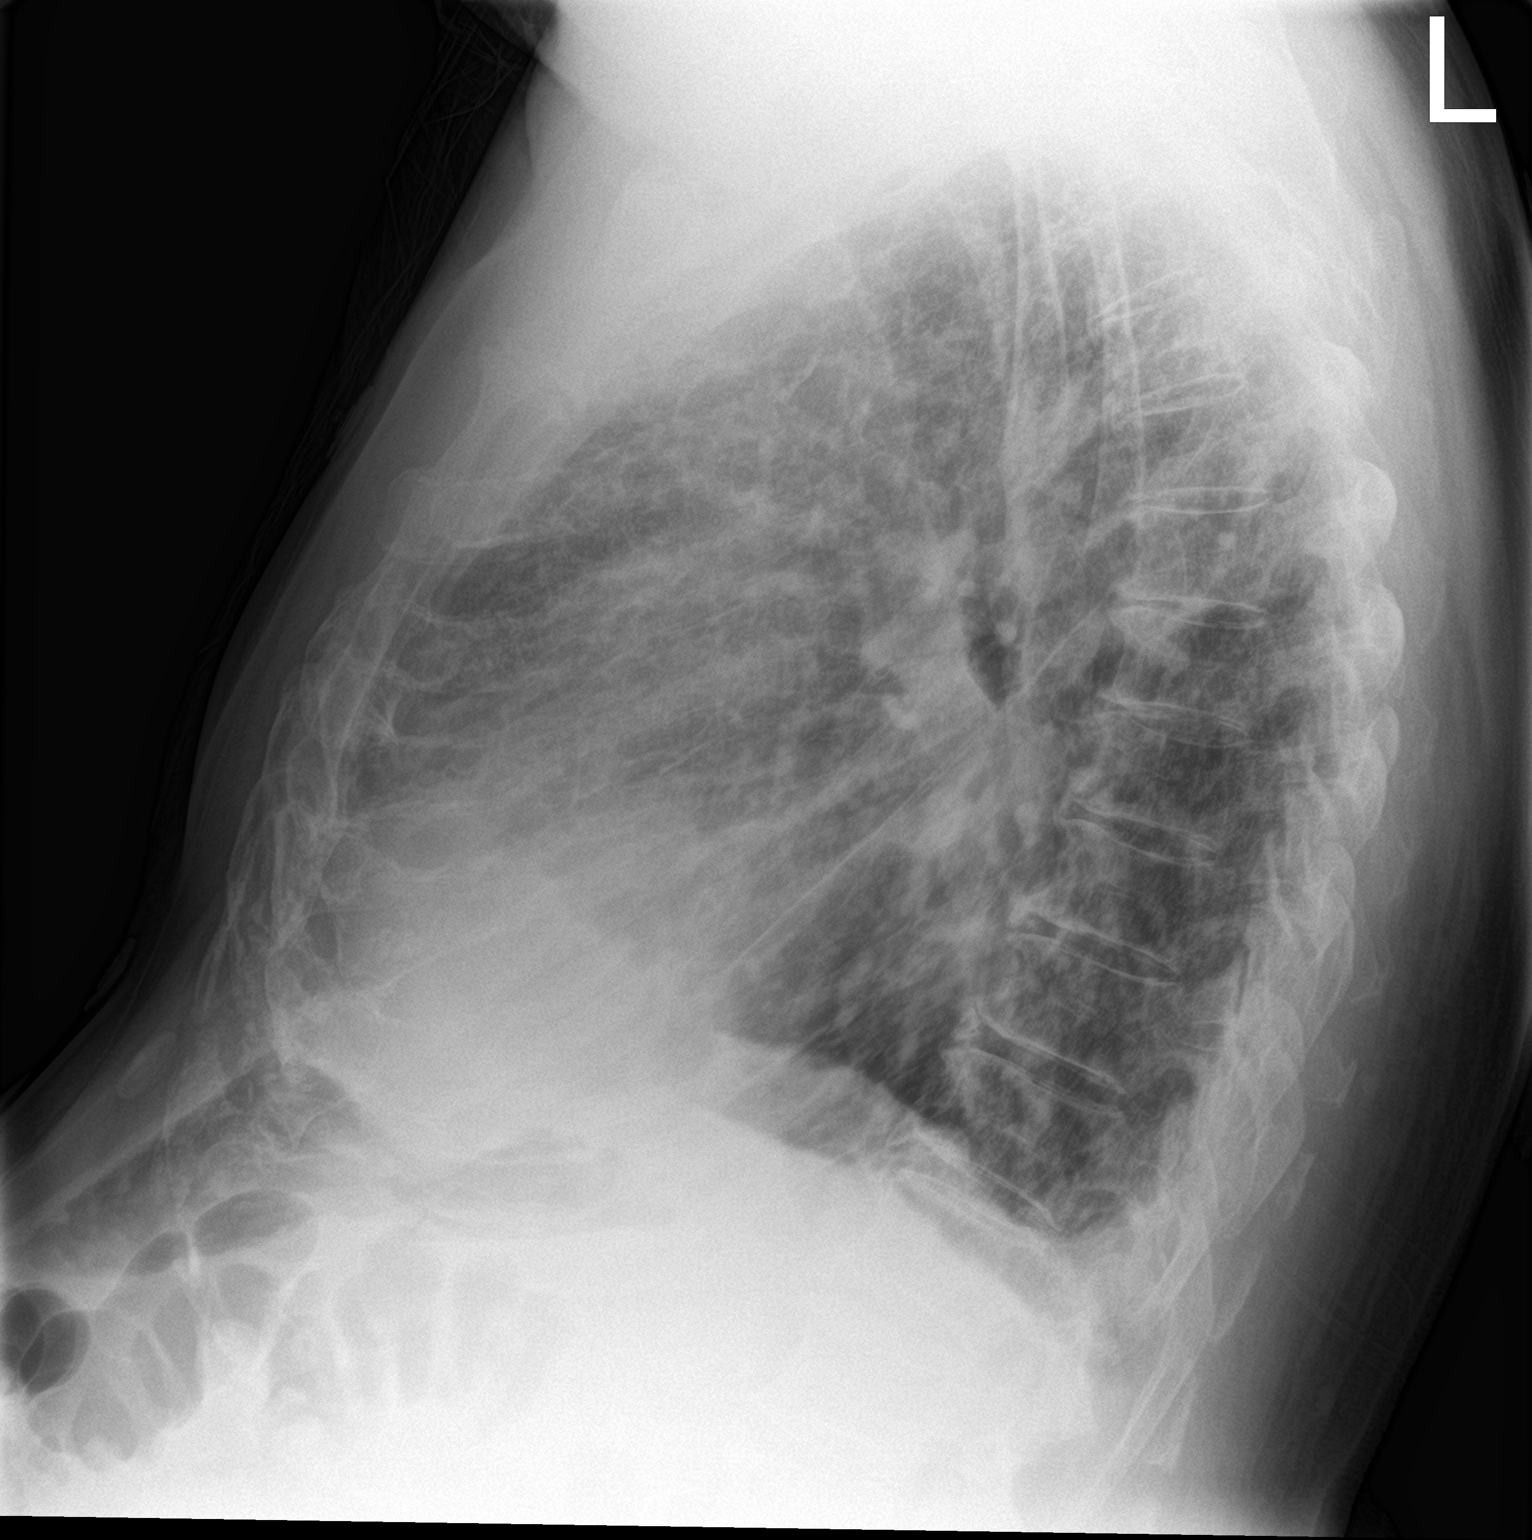

[2 of 2 positions shown; findings below may reference images not displayed]

FINDINGS: Frontal and lateral views of the chest demonstrate an unremarkable
cardiac silhouette. Diffuse scarring and fibrosis again noted,
without superimposed airspace disease, effusion, or pneumothorax.
Lungs remain hyperinflated. Continued biapical pleural thickening.
No acute bony abnormalities.
IMPRESSION: 1. Stable emphysema with diffuse scarring and fibrosis. No acute
airspace disease.
